# Patient Record
Sex: Female | Born: 1935 | Race: Black or African American | Hispanic: No | Marital: Married | Smoking: Former smoker
Health system: Southern US, Community
[De-identification: ages and names within clinical notes are randomized; demographics above are authoritative.]

## PROBLEM LIST (undated history)

## (undated) DIAGNOSIS — E039 Hypothyroidism, unspecified: Secondary | ICD-10-CM

## (undated) DIAGNOSIS — R32 Unspecified urinary incontinence: Secondary | ICD-10-CM

## (undated) DIAGNOSIS — F0390 Unspecified dementia without behavioral disturbance: Secondary | ICD-10-CM

## (undated) DIAGNOSIS — I8390 Asymptomatic varicose veins of unspecified lower extremity: Secondary | ICD-10-CM

## (undated) DIAGNOSIS — E559 Vitamin D deficiency, unspecified: Secondary | ICD-10-CM

## (undated) DIAGNOSIS — N289 Disorder of kidney and ureter, unspecified: Secondary | ICD-10-CM

## (undated) DIAGNOSIS — G8929 Other chronic pain: Secondary | ICD-10-CM

## (undated) DIAGNOSIS — I82409 Acute embolism and thrombosis of unspecified deep veins of unspecified lower extremity: Secondary | ICD-10-CM

## (undated) DIAGNOSIS — J45909 Unspecified asthma, uncomplicated: Secondary | ICD-10-CM

## (undated) DIAGNOSIS — L511 Stevens-Johnson syndrome: Secondary | ICD-10-CM

## (undated) DIAGNOSIS — S8010XA Contusion of unspecified lower leg, initial encounter: Secondary | ICD-10-CM

## (undated) DIAGNOSIS — M19049 Primary osteoarthritis, unspecified hand: Secondary | ICD-10-CM

## (undated) DIAGNOSIS — G40909 Epilepsy, unspecified, not intractable, without status epilepticus: Secondary | ICD-10-CM

## (undated) DIAGNOSIS — H409 Unspecified glaucoma: Secondary | ICD-10-CM

## (undated) DIAGNOSIS — M810 Age-related osteoporosis without current pathological fracture: Secondary | ICD-10-CM

## (undated) DIAGNOSIS — R569 Unspecified convulsions: Secondary | ICD-10-CM

## (undated) DIAGNOSIS — I10 Essential (primary) hypertension: Secondary | ICD-10-CM

## (undated) DIAGNOSIS — I679 Cerebrovascular disease, unspecified: Secondary | ICD-10-CM

## (undated) DIAGNOSIS — L97919 Non-pressure chronic ulcer of unspecified part of right lower leg with unspecified severity: Secondary | ICD-10-CM

## (undated) DIAGNOSIS — H543 Unqualified visual loss, both eyes: Secondary | ICD-10-CM

## (undated) DIAGNOSIS — N3281 Overactive bladder: Secondary | ICD-10-CM

## (undated) DIAGNOSIS — I83009 Varicose veins of unspecified lower extremity with ulcer of unspecified site: Secondary | ICD-10-CM

## (undated) DIAGNOSIS — L723 Sebaceous cyst: Secondary | ICD-10-CM

## (undated) DIAGNOSIS — F419 Anxiety disorder, unspecified: Secondary | ICD-10-CM

## (undated) DIAGNOSIS — G319 Degenerative disease of nervous system, unspecified: Secondary | ICD-10-CM

## (undated) DIAGNOSIS — M549 Dorsalgia, unspecified: Secondary | ICD-10-CM

## (undated) DIAGNOSIS — M1990 Unspecified osteoarthritis, unspecified site: Secondary | ICD-10-CM

## (undated) DIAGNOSIS — M5431 Sciatica, right side: Secondary | ICD-10-CM

## (undated) DIAGNOSIS — I83019 Varicose veins of right lower extremity with ulcer of unspecified site: Secondary | ICD-10-CM

## (undated) DIAGNOSIS — H3582 Retinal ischemia: Secondary | ICD-10-CM

## (undated) DIAGNOSIS — F32A Depression, unspecified: Secondary | ICD-10-CM

## (undated) DIAGNOSIS — F329 Major depressive disorder, single episode, unspecified: Secondary | ICD-10-CM

## (undated) DIAGNOSIS — I639 Cerebral infarction, unspecified: Secondary | ICD-10-CM

## (undated) DIAGNOSIS — L97909 Non-pressure chronic ulcer of unspecified part of unspecified lower leg with unspecified severity: Secondary | ICD-10-CM

## (undated) DIAGNOSIS — C50919 Malignant neoplasm of unspecified site of unspecified female breast: Secondary | ICD-10-CM

## (undated) DIAGNOSIS — M949 Disorder of cartilage, unspecified: Secondary | ICD-10-CM

## (undated) DIAGNOSIS — F418 Other specified anxiety disorders: Secondary | ICD-10-CM

## (undated) DIAGNOSIS — N764 Abscess of vulva: Secondary | ICD-10-CM

## (undated) DIAGNOSIS — M899 Disorder of bone, unspecified: Secondary | ICD-10-CM

## (undated) HISTORY — DX: Sebaceous cyst: L72.3

## (undated) HISTORY — DX: Retinal ischemia: H35.82

## (undated) HISTORY — DX: Non-pressure chronic ulcer of unspecified part of right lower leg with unspecified severity: L97.919

## (undated) HISTORY — DX: Age-related osteoporosis without current pathological fracture: M81.0

## (undated) HISTORY — DX: Disorder of bone, unspecified: M89.9

## (undated) HISTORY — DX: Disorder of kidney and ureter, unspecified: N28.9

## (undated) HISTORY — PX: TONSILLECTOMY: SUR1361

## (undated) HISTORY — DX: Epilepsy, unspecified, not intractable, without status epilepticus: G40.909

## (undated) HISTORY — DX: Disorder of cartilage, unspecified: M94.9

## (undated) HISTORY — DX: Major depressive disorder, single episode, unspecified: F32.9

## (undated) HISTORY — DX: Varicose veins of unspecified lower extremity with ulcer of unspecified site: I83.009

## (undated) HISTORY — DX: Cerebrovascular disease, unspecified: I67.9

## (undated) HISTORY — DX: Malignant neoplasm of unspecified site of unspecified female breast: C50.919

## (undated) HISTORY — DX: Primary osteoarthritis, unspecified hand: M19.049

## (undated) HISTORY — DX: Varicose veins of unspecified lower extremity with ulcer of unspecified site: L97.909

## (undated) HISTORY — DX: Unspecified glaucoma: H40.9

## (undated) HISTORY — PX: REFRACTIVE SURGERY: SHX103

## (undated) HISTORY — DX: Asymptomatic varicose veins of unspecified lower extremity: I83.90

## (undated) HISTORY — DX: Stevens-Johnson syndrome: L51.1

## (undated) HISTORY — DX: Hypothyroidism, unspecified: E03.9

## (undated) HISTORY — DX: Vitamin D deficiency, unspecified: E55.9

## (undated) HISTORY — DX: Depression, unspecified: F32.A

## (undated) HISTORY — DX: Degenerative disease of nervous system, unspecified: G31.9

## (undated) HISTORY — DX: Unspecified urinary incontinence: R32

## (undated) HISTORY — DX: Acute embolism and thrombosis of unspecified deep veins of unspecified lower extremity: I82.409

## (undated) HISTORY — DX: Contusion of unspecified lower leg, initial encounter: S80.10XA

## (undated) HISTORY — PX: TOTAL ABDOMINAL HYSTERECTOMY: SHX209

## (undated) HISTORY — DX: Other specified anxiety disorders: F41.8

## (undated) HISTORY — DX: Unspecified convulsions: R56.9

## (undated) HISTORY — DX: Sciatica, right side: M54.31

## (undated) HISTORY — DX: Unspecified osteoarthritis, unspecified site: M19.90

## (undated) HISTORY — DX: Overactive bladder: N32.81

## (undated) HISTORY — DX: Cerebral infarction, unspecified: I63.9

## (undated) HISTORY — DX: Essential (primary) hypertension: I10

## (undated) HISTORY — DX: Varicose veins of right lower extremity with ulcer of unspecified site: I83.019

---

## 1997-08-24 ENCOUNTER — Ambulatory Visit (HOSPITAL_COMMUNITY): Admission: RE | Admit: 1997-08-24 | Discharge: 1997-08-24 | Payer: Self-pay | Admitting: Internal Medicine

## 1997-10-27 ENCOUNTER — Ambulatory Visit (HOSPITAL_COMMUNITY): Admission: RE | Admit: 1997-10-27 | Discharge: 1997-10-27 | Payer: Self-pay | Admitting: Internal Medicine

## 1998-07-26 ENCOUNTER — Ambulatory Visit (HOSPITAL_COMMUNITY): Admission: RE | Admit: 1998-07-26 | Discharge: 1998-07-26 | Payer: Self-pay | Admitting: Internal Medicine

## 1998-07-26 ENCOUNTER — Encounter: Payer: Self-pay | Admitting: Internal Medicine

## 1998-12-04 ENCOUNTER — Encounter: Payer: Self-pay | Admitting: Internal Medicine

## 1998-12-04 ENCOUNTER — Ambulatory Visit (HOSPITAL_COMMUNITY): Admission: RE | Admit: 1998-12-04 | Discharge: 1998-12-04 | Payer: Self-pay | Admitting: Internal Medicine

## 1998-12-12 ENCOUNTER — Other Ambulatory Visit: Admission: RE | Admit: 1998-12-12 | Discharge: 1998-12-12 | Payer: Self-pay | Admitting: Obstetrics & Gynecology

## 1999-02-11 ENCOUNTER — Encounter: Payer: Self-pay | Admitting: Internal Medicine

## 1999-02-11 ENCOUNTER — Ambulatory Visit (HOSPITAL_COMMUNITY): Admission: RE | Admit: 1999-02-11 | Discharge: 1999-02-11 | Payer: Self-pay | Admitting: Internal Medicine

## 1999-04-12 ENCOUNTER — Encounter: Payer: Self-pay | Admitting: Internal Medicine

## 1999-04-12 ENCOUNTER — Inpatient Hospital Stay (HOSPITAL_COMMUNITY): Admission: AD | Admit: 1999-04-12 | Discharge: 1999-04-17 | Payer: Self-pay | Admitting: Internal Medicine

## 1999-04-15 ENCOUNTER — Encounter: Payer: Self-pay | Admitting: Internal Medicine

## 1999-06-09 ENCOUNTER — Encounter: Payer: Self-pay | Admitting: Emergency Medicine

## 1999-06-09 ENCOUNTER — Emergency Department (HOSPITAL_COMMUNITY): Admission: EM | Admit: 1999-06-09 | Discharge: 1999-06-09 | Payer: Self-pay | Admitting: Emergency Medicine

## 1999-06-25 ENCOUNTER — Encounter: Payer: Self-pay | Admitting: Internal Medicine

## 1999-06-25 ENCOUNTER — Inpatient Hospital Stay (HOSPITAL_COMMUNITY): Admission: AD | Admit: 1999-06-25 | Discharge: 1999-06-28 | Payer: Self-pay | Admitting: Internal Medicine

## 1999-09-02 ENCOUNTER — Emergency Department (HOSPITAL_COMMUNITY): Admission: EM | Admit: 1999-09-02 | Discharge: 1999-09-02 | Payer: Self-pay | Admitting: Emergency Medicine

## 1999-09-02 ENCOUNTER — Encounter: Payer: Self-pay | Admitting: Emergency Medicine

## 1999-09-03 ENCOUNTER — Ambulatory Visit (HOSPITAL_COMMUNITY): Admission: RE | Admit: 1999-09-03 | Discharge: 1999-09-03 | Payer: Self-pay | Admitting: Internal Medicine

## 2000-12-26 ENCOUNTER — Emergency Department (HOSPITAL_COMMUNITY): Admission: EM | Admit: 2000-12-26 | Discharge: 2000-12-27 | Payer: Self-pay | Admitting: Emergency Medicine

## 2000-12-27 ENCOUNTER — Inpatient Hospital Stay (HOSPITAL_COMMUNITY): Admission: EM | Admit: 2000-12-27 | Discharge: 2001-01-02 | Payer: Self-pay | Admitting: Emergency Medicine

## 2000-12-27 ENCOUNTER — Encounter: Payer: Self-pay | Admitting: Emergency Medicine

## 2000-12-30 ENCOUNTER — Encounter: Payer: Self-pay | Admitting: Internal Medicine

## 2001-01-28 ENCOUNTER — Ambulatory Visit (HOSPITAL_COMMUNITY): Admission: RE | Admit: 2001-01-28 | Discharge: 2001-01-28 | Payer: Self-pay | Admitting: Internal Medicine

## 2001-03-25 ENCOUNTER — Encounter: Payer: Self-pay | Admitting: Internal Medicine

## 2001-03-25 ENCOUNTER — Ambulatory Visit (HOSPITAL_COMMUNITY): Admission: RE | Admit: 2001-03-25 | Discharge: 2001-03-25 | Payer: Self-pay | Admitting: Internal Medicine

## 2001-11-25 ENCOUNTER — Emergency Department (HOSPITAL_COMMUNITY): Admission: EM | Admit: 2001-11-25 | Discharge: 2001-11-25 | Payer: Self-pay | Admitting: Emergency Medicine

## 2001-11-30 ENCOUNTER — Ambulatory Visit (HOSPITAL_COMMUNITY): Admission: RE | Admit: 2001-11-30 | Discharge: 2001-11-30 | Payer: Self-pay | Admitting: Internal Medicine

## 2001-11-30 ENCOUNTER — Encounter: Payer: Self-pay | Admitting: Internal Medicine

## 2002-02-15 ENCOUNTER — Encounter: Payer: Self-pay | Admitting: Allergy and Immunology

## 2002-02-15 ENCOUNTER — Encounter: Admission: RE | Admit: 2002-02-15 | Discharge: 2002-02-15 | Payer: Self-pay | Admitting: *Deleted

## 2002-03-22 ENCOUNTER — Other Ambulatory Visit: Admission: RE | Admit: 2002-03-22 | Discharge: 2002-03-22 | Payer: Self-pay | Admitting: *Deleted

## 2003-07-21 ENCOUNTER — Encounter: Admission: RE | Admit: 2003-07-21 | Discharge: 2003-07-21 | Payer: Self-pay | Admitting: Internal Medicine

## 2004-03-18 ENCOUNTER — Ambulatory Visit (HOSPITAL_COMMUNITY): Admission: RE | Admit: 2004-03-18 | Discharge: 2004-03-18 | Payer: Self-pay | Admitting: Internal Medicine

## 2005-01-07 ENCOUNTER — Ambulatory Visit: Payer: Self-pay | Admitting: Internal Medicine

## 2005-01-08 ENCOUNTER — Ambulatory Visit (HOSPITAL_COMMUNITY): Admission: RE | Admit: 2005-01-08 | Discharge: 2005-01-08 | Payer: Self-pay | Admitting: Internal Medicine

## 2005-02-11 ENCOUNTER — Ambulatory Visit: Payer: Self-pay | Admitting: Internal Medicine

## 2005-03-11 ENCOUNTER — Ambulatory Visit: Payer: Self-pay | Admitting: Internal Medicine

## 2005-03-14 ENCOUNTER — Ambulatory Visit: Payer: Self-pay | Admitting: Internal Medicine

## 2005-08-28 ENCOUNTER — Ambulatory Visit: Payer: Self-pay | Admitting: Internal Medicine

## 2005-09-03 ENCOUNTER — Ambulatory Visit: Payer: Self-pay | Admitting: Internal Medicine

## 2006-01-26 ENCOUNTER — Ambulatory Visit: Payer: Self-pay | Admitting: Internal Medicine

## 2006-01-27 ENCOUNTER — Ambulatory Visit: Payer: Self-pay | Admitting: Internal Medicine

## 2006-04-20 ENCOUNTER — Ambulatory Visit: Payer: Self-pay | Admitting: Internal Medicine

## 2006-05-04 ENCOUNTER — Encounter: Admission: RE | Admit: 2006-05-04 | Discharge: 2006-05-04 | Payer: Self-pay | Admitting: Orthopaedic Surgery

## 2006-05-25 ENCOUNTER — Ambulatory Visit: Payer: Self-pay | Admitting: Internal Medicine

## 2006-06-01 ENCOUNTER — Encounter: Admission: RE | Admit: 2006-06-01 | Discharge: 2006-06-01 | Payer: Self-pay | Admitting: Orthopaedic Surgery

## 2006-06-19 ENCOUNTER — Encounter: Admission: RE | Admit: 2006-06-19 | Discharge: 2006-06-19 | Payer: Self-pay | Admitting: Orthopaedic Surgery

## 2006-07-10 ENCOUNTER — Ambulatory Visit: Payer: Self-pay | Admitting: Internal Medicine

## 2006-07-24 ENCOUNTER — Ambulatory Visit: Payer: Self-pay | Admitting: Internal Medicine

## 2006-07-24 LAB — CONVERTED CEMR LAB
Anti Nuclear Antibody(ANA): NEGATIVE
BUN: 9 mg/dL (ref 6–23)
CO2: 28 meq/L (ref 19–32)
Chloride: 112 meq/L (ref 96–112)
Creatinine, Ser: 1 mg/dL (ref 0.4–1.2)
Potassium: 4.4 meq/L (ref 3.5–5.1)
Rhuematoid fact SerPl-aCnc: <20 intl units/mL — ABNORMAL LOW (ref 0.0–20.0)

## 2006-09-14 ENCOUNTER — Ambulatory Visit: Payer: Self-pay | Admitting: Internal Medicine

## 2006-09-24 ENCOUNTER — Ambulatory Visit: Payer: Self-pay

## 2006-10-14 ENCOUNTER — Ambulatory Visit: Payer: Self-pay | Admitting: Internal Medicine

## 2006-10-14 ENCOUNTER — Ambulatory Visit: Payer: Self-pay

## 2006-10-22 HISTORY — PX: TOTAL HIP ARTHROPLASTY: SHX124

## 2006-11-17 ENCOUNTER — Inpatient Hospital Stay (HOSPITAL_COMMUNITY): Admission: RE | Admit: 2006-11-17 | Discharge: 2006-11-24 | Payer: Self-pay | Admitting: Orthopaedic Surgery

## 2006-11-18 ENCOUNTER — Ambulatory Visit: Payer: Self-pay | Admitting: Physical Medicine & Rehabilitation

## 2006-11-23 ENCOUNTER — Ambulatory Visit: Payer: Self-pay | Admitting: Vascular Surgery

## 2006-12-15 ENCOUNTER — Ambulatory Visit: Payer: Self-pay | Admitting: Internal Medicine

## 2006-12-15 LAB — CONVERTED CEMR LAB: TSH: 1.41 microintl units/mL (ref 0.35–5.50)

## 2006-12-18 ENCOUNTER — Emergency Department (HOSPITAL_COMMUNITY): Admission: EM | Admit: 2006-12-18 | Discharge: 2006-12-18 | Payer: Self-pay | Admitting: Emergency Medicine

## 2007-01-15 DIAGNOSIS — Z8679 Personal history of other diseases of the circulatory system: Secondary | ICD-10-CM | POA: Insufficient documentation

## 2007-01-15 DIAGNOSIS — I10 Essential (primary) hypertension: Secondary | ICD-10-CM

## 2007-01-15 DIAGNOSIS — E039 Hypothyroidism, unspecified: Secondary | ICD-10-CM | POA: Insufficient documentation

## 2007-01-15 DIAGNOSIS — R569 Unspecified convulsions: Secondary | ICD-10-CM

## 2007-03-30 ENCOUNTER — Ambulatory Visit: Payer: Self-pay | Admitting: Internal Medicine

## 2007-03-30 LAB — CONVERTED CEMR LAB
ALT: 29 units/L (ref 0–35)
AST: 25 units/L (ref 0–37)
Alkaline Phosphatase: 67 units/L (ref 39–117)
BUN: 15 mg/dL (ref 6–23)
Bacteria, UA: NEGATIVE
Basophils Relative: 0.2 % (ref 0.0–1.0)
Bilirubin, Direct: 0.1 mg/dL (ref 0.0–0.3)
CO2: 32 meq/L (ref 19–32)
Calcium: 9.4 mg/dL (ref 8.4–10.5)
Chloride: 107 meq/L (ref 96–112)
Crystals: NEGATIVE
Eosinophils Relative: 2.9 % (ref 0.0–5.0)
GFR calc Af Amer: 68 mL/min
GFR calc non Af Amer: 57 mL/min
Glucose, Bld: 94 mg/dL (ref 70–99)
HCT: 40.7 % (ref 36.0–46.0)
Hemoglobin, Urine: NEGATIVE
Ketones, ur: NEGATIVE mg/dL
MCV: 81.5 fL (ref 78.0–100.0)
Mucus, UA: NEGATIVE
Neutrophils Relative %: 53 % (ref 43.0–77.0)
Platelets: 217 10*3/uL (ref 150–400)
RBC / HPF: NONE SEEN
RBC: 4.99 M/uL (ref 3.87–5.11)
RDW: 13.4 % (ref 11.5–14.6)
TSH: 1.47 microintl units/mL (ref 0.35–5.50)
Total Protein, Urine: NEGATIVE mg/dL
Total Protein: 7.2 g/dL (ref 6.0–8.3)
WBC: 7.2 10*3/uL (ref 4.5–10.5)

## 2007-05-12 ENCOUNTER — Ambulatory Visit: Payer: Self-pay | Admitting: Internal Medicine

## 2007-05-12 ENCOUNTER — Encounter (INDEPENDENT_AMBULATORY_CARE_PROVIDER_SITE_OTHER): Payer: Self-pay | Admitting: *Deleted

## 2007-05-12 DIAGNOSIS — IMO0001 Reserved for inherently not codable concepts without codable children: Secondary | ICD-10-CM

## 2007-05-13 ENCOUNTER — Encounter: Payer: Self-pay | Admitting: Internal Medicine

## 2007-05-24 DIAGNOSIS — I82409 Acute embolism and thrombosis of unspecified deep veins of unspecified lower extremity: Secondary | ICD-10-CM

## 2007-05-24 HISTORY — DX: Acute embolism and thrombosis of unspecified deep veins of unspecified lower extremity: I82.409

## 2007-05-26 ENCOUNTER — Ambulatory Visit (HOSPITAL_COMMUNITY): Admission: RE | Admit: 2007-05-26 | Discharge: 2007-05-26 | Payer: Self-pay | Admitting: Rheumatology

## 2007-05-26 ENCOUNTER — Encounter (INDEPENDENT_AMBULATORY_CARE_PROVIDER_SITE_OTHER): Payer: Self-pay | Admitting: Rheumatology

## 2007-05-26 ENCOUNTER — Inpatient Hospital Stay (HOSPITAL_COMMUNITY): Admission: EM | Admit: 2007-05-26 | Discharge: 2007-05-28 | Payer: Self-pay | Admitting: Emergency Medicine

## 2007-05-26 ENCOUNTER — Ambulatory Visit: Payer: Self-pay | Admitting: Surgery

## 2007-05-26 ENCOUNTER — Ambulatory Visit: Payer: Self-pay | Admitting: Internal Medicine

## 2007-05-26 ENCOUNTER — Encounter: Payer: Self-pay | Admitting: Internal Medicine

## 2007-05-28 ENCOUNTER — Telehealth: Payer: Self-pay | Admitting: Internal Medicine

## 2007-05-28 ENCOUNTER — Encounter: Payer: Self-pay | Admitting: Internal Medicine

## 2007-05-28 DIAGNOSIS — I82409 Acute embolism and thrombosis of unspecified deep veins of unspecified lower extremity: Secondary | ICD-10-CM | POA: Insufficient documentation

## 2007-05-31 ENCOUNTER — Ambulatory Visit: Payer: Self-pay | Admitting: Internal Medicine

## 2007-05-31 DIAGNOSIS — R5381 Other malaise: Secondary | ICD-10-CM | POA: Insufficient documentation

## 2007-06-02 ENCOUNTER — Telehealth (INDEPENDENT_AMBULATORY_CARE_PROVIDER_SITE_OTHER): Payer: Self-pay | Admitting: *Deleted

## 2007-06-03 ENCOUNTER — Ambulatory Visit: Payer: Self-pay | Admitting: Cardiology

## 2007-06-03 ENCOUNTER — Encounter: Payer: Self-pay | Admitting: Internal Medicine

## 2007-06-10 ENCOUNTER — Ambulatory Visit: Payer: Self-pay | Admitting: Cardiology

## 2007-06-15 ENCOUNTER — Ambulatory Visit: Payer: Self-pay | Admitting: Internal Medicine

## 2007-06-15 DIAGNOSIS — I87029 Postthrombotic syndrome with inflammation of unspecified lower extremity: Secondary | ICD-10-CM | POA: Insufficient documentation

## 2007-06-16 ENCOUNTER — Ambulatory Visit (HOSPITAL_COMMUNITY): Admission: RE | Admit: 2007-06-16 | Discharge: 2007-06-16 | Payer: Self-pay | Admitting: Internal Medicine

## 2007-06-16 ENCOUNTER — Ambulatory Visit: Payer: Self-pay | Admitting: Oncology

## 2007-06-16 ENCOUNTER — Encounter: Payer: Self-pay | Admitting: Internal Medicine

## 2007-06-16 ENCOUNTER — Ambulatory Visit: Payer: Self-pay | Admitting: *Deleted

## 2007-06-21 ENCOUNTER — Ambulatory Visit: Payer: Self-pay | Admitting: Cardiology

## 2007-06-29 ENCOUNTER — Telehealth: Payer: Self-pay | Admitting: Internal Medicine

## 2007-07-05 ENCOUNTER — Ambulatory Visit: Payer: Self-pay | Admitting: Cardiology

## 2007-07-06 ENCOUNTER — Encounter: Payer: Self-pay | Admitting: Internal Medicine

## 2007-07-06 ENCOUNTER — Ambulatory Visit: Payer: Self-pay | Admitting: Vascular Surgery

## 2007-07-22 ENCOUNTER — Encounter: Payer: Self-pay | Admitting: Internal Medicine

## 2007-07-26 ENCOUNTER — Ambulatory Visit: Payer: Self-pay | Admitting: Internal Medicine

## 2007-08-06 ENCOUNTER — Ambulatory Visit: Payer: Self-pay | Admitting: Internal Medicine

## 2007-08-06 DIAGNOSIS — I749 Embolism and thrombosis of unspecified artery: Secondary | ICD-10-CM | POA: Insufficient documentation

## 2007-08-06 DIAGNOSIS — R7309 Other abnormal glucose: Secondary | ICD-10-CM

## 2007-08-06 LAB — CONVERTED CEMR LAB
Basophils Relative: 0.1 % (ref 0.0–1.0)
CO2: 31 meq/L (ref 19–32)
Eosinophils Absolute: 0.2 10*3/uL (ref 0.0–0.6)
Eosinophils Relative: 3.6 % (ref 0.0–5.0)
GFR calc Af Amer: 77 mL/min
GFR calc non Af Amer: 64 mL/min
Glucose, Bld: 97 mg/dL (ref 70–99)
Hemoglobin: 12.8 g/dL (ref 12.0–15.0)
Lymphocytes Relative: 46 % (ref 12.0–46.0)
MCV: 83 fL (ref 78.0–100.0)
Monocytes Absolute: 0.6 10*3/uL (ref 0.2–0.7)
Neutro Abs: 2.5 10*3/uL (ref 1.4–7.7)
Potassium: 4.4 meq/L (ref 3.5–5.1)
Sodium: 144 meq/L (ref 135–145)
WBC: 6.2 10*3/uL (ref 4.5–10.5)

## 2007-08-10 ENCOUNTER — Ambulatory Visit: Payer: Self-pay | Admitting: Internal Medicine

## 2007-08-19 ENCOUNTER — Ambulatory Visit: Payer: Self-pay | Admitting: Oncology

## 2007-08-26 ENCOUNTER — Ambulatory Visit: Payer: Self-pay | Admitting: Cardiovascular Disease

## 2007-09-21 ENCOUNTER — Ambulatory Visit: Payer: Self-pay | Admitting: Internal Medicine

## 2007-09-23 ENCOUNTER — Ambulatory Visit: Payer: Self-pay | Admitting: Internal Medicine

## 2007-10-05 ENCOUNTER — Ambulatory Visit: Payer: Self-pay | Admitting: Family Medicine

## 2007-10-05 ENCOUNTER — Encounter: Payer: Self-pay | Admitting: Internal Medicine

## 2007-10-19 ENCOUNTER — Telehealth (INDEPENDENT_AMBULATORY_CARE_PROVIDER_SITE_OTHER): Payer: Self-pay | Admitting: *Deleted

## 2007-10-21 ENCOUNTER — Ambulatory Visit: Payer: Self-pay | Admitting: Cardiology

## 2007-10-22 ENCOUNTER — Ambulatory Visit: Payer: Self-pay | Admitting: Internal Medicine

## 2007-10-22 DIAGNOSIS — M899 Disorder of bone, unspecified: Secondary | ICD-10-CM | POA: Insufficient documentation

## 2007-10-22 DIAGNOSIS — M949 Disorder of cartilage, unspecified: Secondary | ICD-10-CM

## 2007-11-08 ENCOUNTER — Ambulatory Visit: Payer: Self-pay | Admitting: Internal Medicine

## 2007-11-08 DIAGNOSIS — M542 Cervicalgia: Secondary | ICD-10-CM

## 2007-11-08 DIAGNOSIS — M19049 Primary osteoarthritis, unspecified hand: Secondary | ICD-10-CM | POA: Insufficient documentation

## 2007-11-18 ENCOUNTER — Encounter: Payer: Self-pay | Admitting: Internal Medicine

## 2007-11-18 ENCOUNTER — Ambulatory Visit: Payer: Self-pay | Admitting: Cardiology

## 2007-12-16 ENCOUNTER — Ambulatory Visit: Payer: Self-pay | Admitting: Cardiology

## 2007-12-27 ENCOUNTER — Ambulatory Visit: Payer: Self-pay | Admitting: Internal Medicine

## 2008-01-03 ENCOUNTER — Telehealth: Payer: Self-pay | Admitting: Internal Medicine

## 2008-01-04 ENCOUNTER — Encounter: Payer: Self-pay | Admitting: Internal Medicine

## 2008-01-04 ENCOUNTER — Telehealth: Payer: Self-pay | Admitting: Internal Medicine

## 2008-01-04 ENCOUNTER — Ambulatory Visit (HOSPITAL_COMMUNITY): Admission: RE | Admit: 2008-01-04 | Discharge: 2008-01-04 | Payer: Self-pay | Admitting: Internal Medicine

## 2008-01-13 ENCOUNTER — Ambulatory Visit: Payer: Self-pay | Admitting: Internal Medicine

## 2008-01-29 ENCOUNTER — Telehealth: Payer: Self-pay | Admitting: Internal Medicine

## 2008-02-01 ENCOUNTER — Ambulatory Visit: Payer: Self-pay | Admitting: Cardiovascular Disease

## 2008-02-16 ENCOUNTER — Encounter: Payer: Self-pay | Admitting: Internal Medicine

## 2008-02-17 ENCOUNTER — Encounter: Payer: Self-pay | Admitting: Internal Medicine

## 2008-02-18 ENCOUNTER — Ambulatory Visit: Payer: Self-pay | Admitting: Cardiology

## 2008-03-13 ENCOUNTER — Ambulatory Visit: Payer: Self-pay | Admitting: Cardiovascular Disease

## 2008-04-04 ENCOUNTER — Encounter (HOSPITAL_COMMUNITY): Admission: RE | Admit: 2008-04-04 | Discharge: 2008-06-22 | Payer: Self-pay | Admitting: Internal Medicine

## 2008-04-07 ENCOUNTER — Ambulatory Visit: Payer: Self-pay | Admitting: Cardiology

## 2008-04-28 ENCOUNTER — Telehealth: Payer: Self-pay | Admitting: Internal Medicine

## 2008-04-28 ENCOUNTER — Ambulatory Visit: Payer: Self-pay | Admitting: Internal Medicine

## 2008-04-28 LAB — CONVERTED CEMR LAB
BUN: 20 mg/dL (ref 6–23)
CO2: 30 meq/L (ref 19–32)
Calcium: 9 mg/dL (ref 8.4–10.5)
Creatinine, Ser: 1.2 mg/dL (ref 0.4–1.2)
Glucose, Bld: 95 mg/dL (ref 70–99)
Sodium: 139 meq/L (ref 135–145)

## 2008-05-05 ENCOUNTER — Ambulatory Visit: Payer: Self-pay | Admitting: Internal Medicine

## 2008-05-26 ENCOUNTER — Encounter: Payer: Self-pay | Admitting: Internal Medicine

## 2008-06-28 ENCOUNTER — Ambulatory Visit: Payer: Self-pay | Admitting: Cardiology

## 2008-06-28 ENCOUNTER — Encounter: Payer: Self-pay | Admitting: Internal Medicine

## 2008-07-03 ENCOUNTER — Encounter (HOSPITAL_COMMUNITY): Admission: RE | Admit: 2008-07-03 | Discharge: 2008-10-01 | Payer: Self-pay | Admitting: Internal Medicine

## 2008-07-26 ENCOUNTER — Ambulatory Visit: Payer: Self-pay | Admitting: Cardiology

## 2008-08-25 ENCOUNTER — Ambulatory Visit: Payer: Self-pay | Admitting: Internal Medicine

## 2008-08-25 LAB — CONVERTED CEMR LAB
Basophils Absolute: 0 10*3/uL (ref 0.0–0.1)
Eosinophils Absolute: 0.3 10*3/uL (ref 0.0–0.7)
Eosinophils Relative: 4.4 % (ref 0.0–5.0)
HCT: 42.6 % (ref 36.0–46.0)
MCHC: 33.4 g/dL (ref 30.0–36.0)
MCV: 82.7 fL (ref 78.0–100.0)
Monocytes Absolute: 0.4 10*3/uL (ref 0.1–1.0)
Platelets: 222 10*3/uL (ref 150–400)
WBC: 6.3 10*3/uL (ref 4.5–10.5)

## 2008-08-26 ENCOUNTER — Encounter: Payer: Self-pay | Admitting: Internal Medicine

## 2008-08-29 ENCOUNTER — Telehealth: Payer: Self-pay | Admitting: Internal Medicine

## 2008-08-29 DIAGNOSIS — E559 Vitamin D deficiency, unspecified: Secondary | ICD-10-CM | POA: Insufficient documentation

## 2008-09-18 ENCOUNTER — Ambulatory Visit: Payer: Self-pay | Admitting: Cardiology

## 2008-10-02 ENCOUNTER — Ambulatory Visit: Payer: Self-pay | Admitting: Internal Medicine

## 2008-10-03 ENCOUNTER — Encounter: Payer: Self-pay | Admitting: Internal Medicine

## 2008-10-03 ENCOUNTER — Telehealth: Payer: Self-pay | Admitting: Internal Medicine

## 2008-10-09 ENCOUNTER — Ambulatory Visit: Payer: Self-pay | Admitting: Internal Medicine

## 2008-10-09 DIAGNOSIS — M545 Low back pain, unspecified: Secondary | ICD-10-CM | POA: Insufficient documentation

## 2008-10-09 LAB — CONVERTED CEMR LAB

## 2008-10-23 ENCOUNTER — Telehealth: Payer: Self-pay | Admitting: Internal Medicine

## 2008-10-23 ENCOUNTER — Ambulatory Visit: Payer: Self-pay | Admitting: Internal Medicine

## 2008-10-30 ENCOUNTER — Ambulatory Visit: Payer: Self-pay | Admitting: Cardiovascular Disease

## 2008-10-31 ENCOUNTER — Ambulatory Visit: Payer: Self-pay | Admitting: Internal Medicine

## 2008-10-31 DIAGNOSIS — F341 Dysthymic disorder: Secondary | ICD-10-CM

## 2008-10-31 DIAGNOSIS — H409 Unspecified glaucoma: Secondary | ICD-10-CM | POA: Insufficient documentation

## 2008-11-17 ENCOUNTER — Ambulatory Visit: Payer: Self-pay | Admitting: Internal Medicine

## 2008-11-17 LAB — CONVERTED CEMR LAB: POC INR: 1.7

## 2008-11-22 ENCOUNTER — Encounter: Payer: Self-pay | Admitting: *Deleted

## 2008-12-05 ENCOUNTER — Ambulatory Visit: Payer: Self-pay | Admitting: Internal Medicine

## 2008-12-05 DIAGNOSIS — M79609 Pain in unspecified limb: Secondary | ICD-10-CM | POA: Insufficient documentation

## 2008-12-05 LAB — CONVERTED CEMR LAB
GFR calc non Af Amer: 60.97 mL/min (ref >60)
Glucose, Bld: 89 mg/dL (ref 70–99)
Potassium: 5.1 meq/L (ref 3.5–5.1)
Sodium: 143 meq/L (ref 135–145)
Vit D, 1,25-Dihydroxy: 33

## 2008-12-07 ENCOUNTER — Encounter: Payer: Self-pay | Admitting: Internal Medicine

## 2008-12-08 ENCOUNTER — Ambulatory Visit: Payer: Self-pay | Admitting: Internal Medicine

## 2008-12-08 LAB — CONVERTED CEMR LAB: POC INR: 1.6

## 2008-12-19 ENCOUNTER — Emergency Department (HOSPITAL_COMMUNITY): Admission: EM | Admit: 2008-12-19 | Discharge: 2008-12-19 | Payer: Self-pay | Admitting: Emergency Medicine

## 2008-12-19 ENCOUNTER — Telehealth: Payer: Self-pay | Admitting: Family Medicine

## 2008-12-20 ENCOUNTER — Telehealth: Payer: Self-pay | Admitting: Internal Medicine

## 2008-12-21 ENCOUNTER — Ambulatory Visit: Payer: Self-pay | Admitting: Internal Medicine

## 2008-12-22 ENCOUNTER — Telehealth: Payer: Self-pay | Admitting: Internal Medicine

## 2008-12-24 ENCOUNTER — Encounter: Payer: Self-pay | Admitting: Internal Medicine

## 2008-12-26 ENCOUNTER — Ambulatory Visit: Payer: Self-pay | Admitting: Internal Medicine

## 2008-12-26 DIAGNOSIS — R42 Dizziness and giddiness: Secondary | ICD-10-CM

## 2008-12-27 ENCOUNTER — Ambulatory Visit (HOSPITAL_BASED_OUTPATIENT_CLINIC_OR_DEPARTMENT_OTHER): Admission: RE | Admit: 2008-12-27 | Discharge: 2008-12-27 | Payer: Self-pay | Admitting: Internal Medicine

## 2008-12-27 ENCOUNTER — Encounter: Payer: Self-pay | Admitting: *Deleted

## 2008-12-27 ENCOUNTER — Ambulatory Visit: Payer: Self-pay | Admitting: Diagnostic Radiology

## 2008-12-28 ENCOUNTER — Telehealth: Payer: Self-pay | Admitting: Internal Medicine

## 2009-01-08 ENCOUNTER — Ambulatory Visit: Payer: Self-pay | Admitting: Cardiology

## 2009-01-08 LAB — CONVERTED CEMR LAB
POC INR: 3.8
Prothrombin Time: 23.6 s

## 2009-01-15 ENCOUNTER — Telehealth: Payer: Self-pay | Admitting: Internal Medicine

## 2009-01-17 ENCOUNTER — Telehealth: Payer: Self-pay | Admitting: Internal Medicine

## 2009-01-22 ENCOUNTER — Ambulatory Visit: Payer: Self-pay | Admitting: Internal Medicine

## 2009-01-22 ENCOUNTER — Encounter (INDEPENDENT_AMBULATORY_CARE_PROVIDER_SITE_OTHER): Payer: Self-pay | Admitting: Cardiology

## 2009-01-30 ENCOUNTER — Encounter: Payer: Self-pay | Admitting: Internal Medicine

## 2009-02-09 ENCOUNTER — Encounter: Payer: Self-pay | Admitting: Internal Medicine

## 2009-02-12 ENCOUNTER — Encounter (INDEPENDENT_AMBULATORY_CARE_PROVIDER_SITE_OTHER): Payer: Self-pay | Admitting: Cardiology

## 2009-02-12 ENCOUNTER — Ambulatory Visit: Payer: Self-pay | Admitting: Cardiovascular Disease

## 2009-02-12 LAB — CONVERTED CEMR LAB
Basophils Absolute: 0 10*3/uL (ref 0.0–0.1)
Basophils Relative: 0 % (ref 0–1)
Eosinophils Absolute: 0.2 10*3/uL (ref 0.0–0.7)
Eosinophils Relative: 3 % (ref 0–5)
HCT: 41.1 % (ref 36.0–46.0)
MCV: 80.6 fL (ref 78.0–100.0)
Platelets: 240 10*3/uL (ref 150–400)
RDW: 15.1 % (ref 11.5–15.5)
aPTT: 44 s — ABNORMAL HIGH (ref 24–37)

## 2009-02-13 ENCOUNTER — Ambulatory Visit (HOSPITAL_BASED_OUTPATIENT_CLINIC_OR_DEPARTMENT_OTHER): Admission: RE | Admit: 2009-02-13 | Discharge: 2009-02-13 | Payer: Self-pay | Admitting: Internal Medicine

## 2009-02-13 ENCOUNTER — Ambulatory Visit: Payer: Self-pay | Admitting: Diagnostic Radiology

## 2009-02-13 ENCOUNTER — Ambulatory Visit: Payer: Self-pay | Admitting: Internal Medicine

## 2009-02-13 DIAGNOSIS — R05 Cough: Secondary | ICD-10-CM

## 2009-02-13 DIAGNOSIS — R059 Cough, unspecified: Secondary | ICD-10-CM | POA: Insufficient documentation

## 2009-03-05 ENCOUNTER — Ambulatory Visit: Payer: Self-pay | Admitting: Cardiovascular Disease

## 2009-03-19 ENCOUNTER — Telehealth: Payer: Self-pay | Admitting: Internal Medicine

## 2009-03-22 ENCOUNTER — Ambulatory Visit: Payer: Self-pay | Admitting: Internal Medicine

## 2009-03-22 DIAGNOSIS — L97909 Non-pressure chronic ulcer of unspecified part of unspecified lower leg with unspecified severity: Secondary | ICD-10-CM

## 2009-03-22 DIAGNOSIS — I83009 Varicose veins of unspecified lower extremity with ulcer of unspecified site: Secondary | ICD-10-CM

## 2009-03-27 ENCOUNTER — Encounter: Payer: Self-pay | Admitting: Internal Medicine

## 2009-03-29 ENCOUNTER — Telehealth: Payer: Self-pay | Admitting: Internal Medicine

## 2009-03-30 ENCOUNTER — Ambulatory Visit: Payer: Self-pay | Admitting: Internal Medicine

## 2009-03-30 LAB — CONVERTED CEMR LAB: POC INR: 3

## 2009-04-02 ENCOUNTER — Telehealth: Payer: Self-pay | Admitting: Internal Medicine

## 2009-04-04 ENCOUNTER — Ambulatory Visit: Payer: Self-pay | Admitting: Internal Medicine

## 2009-04-04 ENCOUNTER — Encounter: Admission: RE | Admit: 2009-04-04 | Discharge: 2009-04-04 | Payer: Self-pay | Admitting: Orthopaedic Surgery

## 2009-04-04 LAB — CONVERTED CEMR LAB: POC INR: 1.2

## 2009-04-09 ENCOUNTER — Ambulatory Visit: Payer: Self-pay | Admitting: Internal Medicine

## 2009-04-12 ENCOUNTER — Telehealth: Payer: Self-pay | Admitting: Internal Medicine

## 2009-04-13 ENCOUNTER — Ambulatory Visit: Payer: Self-pay | Admitting: Cardiology

## 2009-04-20 ENCOUNTER — Encounter: Payer: Self-pay | Admitting: Internal Medicine

## 2009-04-23 ENCOUNTER — Ambulatory Visit: Payer: Self-pay

## 2009-04-23 ENCOUNTER — Encounter: Payer: Self-pay | Admitting: Internal Medicine

## 2009-04-26 ENCOUNTER — Ambulatory Visit: Payer: Self-pay | Admitting: Internal Medicine

## 2009-04-26 DIAGNOSIS — R51 Headache: Secondary | ICD-10-CM

## 2009-04-26 DIAGNOSIS — R519 Headache, unspecified: Secondary | ICD-10-CM | POA: Insufficient documentation

## 2009-04-27 ENCOUNTER — Ambulatory Visit: Payer: Self-pay | Admitting: Cardiovascular Disease

## 2009-04-27 LAB — CONVERTED CEMR LAB: POC INR: 2.9

## 2009-04-30 ENCOUNTER — Telehealth: Payer: Self-pay | Admitting: Internal Medicine

## 2009-04-30 ENCOUNTER — Encounter: Admission: RE | Admit: 2009-04-30 | Discharge: 2009-04-30 | Payer: Self-pay | Admitting: Internal Medicine

## 2009-05-11 ENCOUNTER — Ambulatory Visit: Payer: Self-pay | Admitting: Cardiology

## 2009-05-11 LAB — CONVERTED CEMR LAB: POC INR: 1.8

## 2009-05-24 ENCOUNTER — Ambulatory Visit: Payer: Self-pay | Admitting: Cardiovascular Disease

## 2009-05-24 LAB — CONVERTED CEMR LAB: POC INR: 2.7

## 2009-06-07 ENCOUNTER — Encounter: Payer: Self-pay | Admitting: Internal Medicine

## 2009-06-14 ENCOUNTER — Ambulatory Visit: Payer: Self-pay | Admitting: Cardiology

## 2009-06-23 HISTORY — PX: MASTECTOMY: SHX3

## 2009-07-02 ENCOUNTER — Ambulatory Visit: Payer: Self-pay | Admitting: Internal Medicine

## 2009-07-02 LAB — CONVERTED CEMR LAB
CO2: 29 meq/L (ref 19–32)
Calcium: 8.7 mg/dL (ref 8.4–10.5)
Glucose, Bld: 89 mg/dL (ref 70–99)
Sodium: 143 meq/L (ref 135–145)

## 2009-07-06 ENCOUNTER — Ambulatory Visit: Payer: Self-pay | Admitting: Internal Medicine

## 2009-07-06 DIAGNOSIS — H3582 Retinal ischemia: Secondary | ICD-10-CM

## 2009-07-13 ENCOUNTER — Telehealth: Payer: Self-pay | Admitting: Internal Medicine

## 2009-07-17 ENCOUNTER — Telehealth: Payer: Self-pay | Admitting: Internal Medicine

## 2009-07-20 ENCOUNTER — Ambulatory Visit: Payer: Self-pay | Admitting: Internal Medicine

## 2009-07-25 ENCOUNTER — Encounter: Payer: Self-pay | Admitting: Internal Medicine

## 2009-07-25 ENCOUNTER — Ambulatory Visit: Payer: Self-pay | Admitting: Internal Medicine

## 2009-07-25 LAB — CONVERTED CEMR LAB: POC INR: 4.3

## 2009-07-26 ENCOUNTER — Telehealth: Payer: Self-pay | Admitting: Internal Medicine

## 2009-07-26 ENCOUNTER — Ambulatory Visit: Payer: Self-pay

## 2009-07-26 ENCOUNTER — Encounter: Payer: Self-pay | Admitting: Internal Medicine

## 2009-07-31 ENCOUNTER — Telehealth: Payer: Self-pay | Admitting: Internal Medicine

## 2009-08-06 ENCOUNTER — Telehealth: Payer: Self-pay | Admitting: Internal Medicine

## 2009-08-09 ENCOUNTER — Ambulatory Visit: Payer: Self-pay | Admitting: Cardiology

## 2009-08-21 ENCOUNTER — Telehealth: Payer: Self-pay | Admitting: Internal Medicine

## 2009-08-21 ENCOUNTER — Ambulatory Visit: Payer: Self-pay | Admitting: Family

## 2009-08-21 ENCOUNTER — Ambulatory Visit: Payer: Self-pay | Admitting: Radiology

## 2009-08-21 ENCOUNTER — Ambulatory Visit (HOSPITAL_BASED_OUTPATIENT_CLINIC_OR_DEPARTMENT_OTHER): Admission: RE | Admit: 2009-08-21 | Discharge: 2009-08-21 | Payer: Self-pay | Admitting: Internal Medicine

## 2009-08-21 DIAGNOSIS — L259 Unspecified contact dermatitis, unspecified cause: Secondary | ICD-10-CM

## 2009-08-21 DIAGNOSIS — J40 Bronchitis, not specified as acute or chronic: Secondary | ICD-10-CM

## 2009-08-23 ENCOUNTER — Ambulatory Visit: Payer: Self-pay | Admitting: Cardiology

## 2009-08-23 ENCOUNTER — Encounter (INDEPENDENT_AMBULATORY_CARE_PROVIDER_SITE_OTHER): Payer: Self-pay | Admitting: Cardiology

## 2009-08-24 ENCOUNTER — Ambulatory Visit: Payer: Self-pay | Admitting: Internal Medicine

## 2009-09-05 ENCOUNTER — Encounter (INDEPENDENT_AMBULATORY_CARE_PROVIDER_SITE_OTHER): Payer: Self-pay | Admitting: *Deleted

## 2009-09-14 ENCOUNTER — Ambulatory Visit: Payer: Self-pay | Admitting: Internal Medicine

## 2009-09-14 LAB — CONVERTED CEMR LAB: POC INR: 3.3

## 2009-09-17 ENCOUNTER — Telehealth: Payer: Self-pay | Admitting: Internal Medicine

## 2009-09-17 ENCOUNTER — Ambulatory Visit: Payer: Self-pay | Admitting: Internal Medicine

## 2009-09-17 DIAGNOSIS — N318 Other neuromuscular dysfunction of bladder: Secondary | ICD-10-CM

## 2009-09-18 ENCOUNTER — Telehealth: Payer: Self-pay | Admitting: Internal Medicine

## 2009-10-04 ENCOUNTER — Encounter: Payer: Self-pay | Admitting: Internal Medicine

## 2009-10-05 ENCOUNTER — Ambulatory Visit: Payer: Self-pay | Admitting: Internal Medicine

## 2009-10-05 LAB — CONVERTED CEMR LAB: POC INR: 1.7

## 2009-10-23 ENCOUNTER — Ambulatory Visit: Payer: Self-pay | Admitting: Cardiology

## 2009-10-30 ENCOUNTER — Ambulatory Visit: Payer: Self-pay | Admitting: Cardiovascular Disease

## 2009-11-13 ENCOUNTER — Ambulatory Visit: Payer: Self-pay | Admitting: Internal Medicine

## 2009-11-13 LAB — CONVERTED CEMR LAB: POC INR: 2.5

## 2009-12-03 ENCOUNTER — Ambulatory Visit: Payer: Self-pay | Admitting: Internal Medicine

## 2009-12-31 ENCOUNTER — Ambulatory Visit: Payer: Self-pay | Admitting: Internal Medicine

## 2010-01-21 ENCOUNTER — Ambulatory Visit: Payer: Self-pay | Admitting: Cardiovascular Disease

## 2010-02-11 ENCOUNTER — Telehealth: Payer: Self-pay | Admitting: Internal Medicine

## 2010-02-12 ENCOUNTER — Ambulatory Visit: Payer: Self-pay | Admitting: Internal Medicine

## 2010-02-12 LAB — CONVERTED CEMR LAB
BUN: 15 mg/dL (ref 6–23)
CO2: 27 meq/L (ref 19–32)
Calcium: 9.4 mg/dL (ref 8.4–10.5)
Creatinine, Ser: 1.14 mg/dL (ref 0.40–1.20)
Glucose, Bld: 82 mg/dL (ref 70–99)
HCT: 42.3 % (ref 36.0–46.0)
Hemoglobin: 13.9 g/dL (ref 12.0–15.0)
MCV: 81.2 fL (ref 78.0–100.0)
Platelets: 205 10*3/uL (ref 150–400)
RBC: 5.21 M/uL — ABNORMAL HIGH (ref 3.87–5.11)
WBC: 6.7 10*3/uL (ref 4.0–10.5)

## 2010-02-14 ENCOUNTER — Telehealth: Payer: Self-pay | Admitting: Internal Medicine

## 2010-02-18 ENCOUNTER — Ambulatory Visit: Payer: Self-pay | Admitting: Cardiology

## 2010-03-18 ENCOUNTER — Encounter: Payer: Self-pay | Admitting: Cardiovascular Disease

## 2010-03-18 ENCOUNTER — Ambulatory Visit: Payer: Self-pay | Admitting: Internal Medicine

## 2010-03-18 DIAGNOSIS — R413 Other amnesia: Secondary | ICD-10-CM | POA: Insufficient documentation

## 2010-03-18 LAB — CONVERTED CEMR LAB
POC INR: 2.37
Prothrombin Time: 26 s

## 2010-03-19 ENCOUNTER — Encounter: Payer: Self-pay | Admitting: Cardiovascular Disease

## 2010-03-19 ENCOUNTER — Encounter: Payer: Self-pay | Admitting: Internal Medicine

## 2010-03-19 LAB — CONVERTED CEMR LAB: Prothrombin Time: 26 s — ABNORMAL HIGH (ref 11.6–15.2)

## 2010-03-20 ENCOUNTER — Encounter: Payer: Self-pay | Admitting: Internal Medicine

## 2010-03-23 HISTORY — PX: BREAST BIOPSY: SHX20

## 2010-03-23 LAB — HM MAMMOGRAPHY

## 2010-03-25 ENCOUNTER — Ambulatory Visit: Payer: Self-pay | Admitting: Internal Medicine

## 2010-04-02 ENCOUNTER — Encounter: Admission: RE | Admit: 2010-04-02 | Discharge: 2010-04-02 | Payer: Self-pay | Admitting: Obstetrics & Gynecology

## 2010-04-09 ENCOUNTER — Encounter: Payer: Self-pay | Admitting: Internal Medicine

## 2010-04-09 ENCOUNTER — Ambulatory Visit: Payer: Self-pay | Admitting: Oncology

## 2010-04-09 ENCOUNTER — Telehealth: Payer: Self-pay | Admitting: Internal Medicine

## 2010-04-11 ENCOUNTER — Ambulatory Visit (HOSPITAL_BASED_OUTPATIENT_CLINIC_OR_DEPARTMENT_OTHER): Admission: RE | Admit: 2010-04-11 | Discharge: 2010-04-11 | Payer: Self-pay | Admitting: Internal Medicine

## 2010-04-11 ENCOUNTER — Ambulatory Visit: Payer: Self-pay | Admitting: Diagnostic Radiology

## 2010-04-11 ENCOUNTER — Ambulatory Visit: Payer: Self-pay | Admitting: Internal Medicine

## 2010-04-11 ENCOUNTER — Encounter: Payer: Self-pay | Admitting: Internal Medicine

## 2010-04-11 DIAGNOSIS — C50919 Malignant neoplasm of unspecified site of unspecified female breast: Secondary | ICD-10-CM

## 2010-04-11 HISTORY — DX: Malignant neoplasm of unspecified site of unspecified female breast: C50.919

## 2010-04-12 ENCOUNTER — Telehealth: Payer: Self-pay | Admitting: Internal Medicine

## 2010-04-15 ENCOUNTER — Telehealth: Payer: Self-pay | Admitting: Internal Medicine

## 2010-04-15 ENCOUNTER — Ambulatory Visit: Payer: Self-pay | Admitting: Internal Medicine

## 2010-04-16 ENCOUNTER — Ambulatory Visit: Payer: Self-pay | Admitting: Cardiology

## 2010-04-17 ENCOUNTER — Encounter: Payer: Self-pay | Admitting: Internal Medicine

## 2010-04-17 LAB — COMPREHENSIVE METABOLIC PANEL
ALT: 24 U/L (ref 0–35)
AST: 27 U/L (ref 0–37)
Albumin: 3.5 g/dL (ref 3.5–5.2)
Alkaline Phosphatase: 51 U/L (ref 39–117)
Potassium: 3.8 mEq/L (ref 3.5–5.3)
Sodium: 142 mEq/L (ref 135–145)
Total Protein: 6.5 g/dL (ref 6.0–8.3)

## 2010-04-17 LAB — CBC WITH DIFFERENTIAL/PLATELET
EOS%: 3 % (ref 0.0–7.0)
Eosinophils Absolute: 0.2 10*3/uL (ref 0.0–0.5)
MCV: 82.3 fL (ref 79.5–101.0)
MONO%: 10.1 % (ref 0.0–14.0)
NEUT#: 2.5 10*3/uL (ref 1.5–6.5)
RBC: 4.95 10*6/uL (ref 3.70–5.45)
RDW: 14.8 % — ABNORMAL HIGH (ref 11.2–14.5)

## 2010-04-19 ENCOUNTER — Encounter (HOSPITAL_COMMUNITY)
Admission: RE | Admit: 2010-04-19 | Discharge: 2010-06-22 | Payer: Self-pay | Source: Home / Self Care | Attending: Internal Medicine | Admitting: Internal Medicine

## 2010-04-22 ENCOUNTER — Telehealth: Payer: Self-pay | Admitting: Internal Medicine

## 2010-05-07 ENCOUNTER — Ambulatory Visit: Payer: Self-pay | Admitting: Cardiovascular Disease

## 2010-05-13 ENCOUNTER — Telehealth: Payer: Self-pay | Admitting: Internal Medicine

## 2010-05-17 ENCOUNTER — Ambulatory Visit: Payer: Self-pay | Admitting: Oncology

## 2010-05-21 ENCOUNTER — Other Ambulatory Visit: Payer: Self-pay | Admitting: Oncology

## 2010-05-21 LAB — PROTIME-INR
INR: 2.5 (ref 2.00–3.50)
Protime: 30 Seconds — ABNORMAL HIGH (ref 10.6–13.4)

## 2010-05-22 ENCOUNTER — Other Ambulatory Visit: Payer: Self-pay | Admitting: Physician Assistant

## 2010-05-22 ENCOUNTER — Ambulatory Visit (HOSPITAL_COMMUNITY)
Admission: RE | Admit: 2010-05-22 | Discharge: 2010-05-23 | Payer: Self-pay | Source: Home / Self Care | Admitting: Surgery

## 2010-05-22 ENCOUNTER — Encounter (INDEPENDENT_AMBULATORY_CARE_PROVIDER_SITE_OTHER): Payer: Self-pay | Admitting: Surgery

## 2010-05-22 LAB — PROTIME-INR: Protime: 16.8 Seconds — ABNORMAL HIGH (ref 10.6–13.4)

## 2010-05-26 ENCOUNTER — Observation Stay (HOSPITAL_COMMUNITY)
Admission: EM | Admit: 2010-05-26 | Discharge: 2010-05-27 | Payer: Self-pay | Source: Home / Self Care | Admitting: Emergency Medicine

## 2010-05-27 ENCOUNTER — Encounter: Payer: Self-pay | Admitting: Internal Medicine

## 2010-05-29 ENCOUNTER — Encounter: Payer: Self-pay | Admitting: Internal Medicine

## 2010-05-29 ENCOUNTER — Other Ambulatory Visit: Payer: Self-pay | Admitting: Oncology

## 2010-05-29 LAB — CBC WITH DIFFERENTIAL/PLATELET
BASO%: 0.2 % (ref 0.0–2.0)
HCT: 35.4 % (ref 34.8–46.6)
LYMPH%: 20.4 % (ref 14.0–49.7)
MCHC: 32.8 g/dL (ref 31.5–36.0)
MCV: 80.8 fL (ref 79.5–101.0)
MONO#: 0.9 10*3/uL (ref 0.1–0.9)
MONO%: 8.5 % (ref 0.0–14.0)
NEUT%: 66.2 % (ref 38.4–76.8)
Platelets: 171 10*3/uL (ref 145–400)
RBC: 4.38 10*6/uL (ref 3.70–5.45)
WBC: 10 10*3/uL (ref 3.9–10.3)
nRBC: 0 % (ref 0–0)

## 2010-05-30 ENCOUNTER — Encounter: Payer: Self-pay | Admitting: Internal Medicine

## 2010-05-31 ENCOUNTER — Other Ambulatory Visit: Payer: Self-pay | Admitting: Physician Assistant

## 2010-05-31 LAB — CBC WITH DIFFERENTIAL/PLATELET
Eosinophils Absolute: 0.5 10*3/uL (ref 0.0–0.5)
HCT: 31.1 % — ABNORMAL LOW (ref 34.8–46.6)
HGB: 10.2 g/dL — ABNORMAL LOW (ref 11.6–15.9)
LYMPH%: 37.2 % (ref 14.0–49.7)
MONO#: 0.7 10*3/uL (ref 0.1–0.9)
NEUT#: 3.6 10*3/uL (ref 1.5–6.5)
NEUT%: 47.5 % (ref 38.4–76.8)
Platelets: 202 10*3/uL (ref 145–400)
WBC: 7.5 10*3/uL (ref 3.9–10.3)

## 2010-06-03 ENCOUNTER — Other Ambulatory Visit: Payer: Self-pay | Admitting: Oncology

## 2010-06-03 ENCOUNTER — Encounter: Payer: Self-pay | Admitting: Internal Medicine

## 2010-06-03 LAB — CBC WITH DIFFERENTIAL/PLATELET
Eosinophils Absolute: 0.4 10*3/uL (ref 0.0–0.5)
HCT: 32.4 % — ABNORMAL LOW (ref 34.8–46.6)
LYMPH%: 38.3 % (ref 14.0–49.7)
MONO#: 0.5 10*3/uL (ref 0.1–0.9)
NEUT#: 2.9 10*3/uL (ref 1.5–6.5)
NEUT%: 46.9 % (ref 38.4–76.8)
Platelets: 278 10*3/uL (ref 145–400)
RBC: 3.89 10*6/uL (ref 3.70–5.45)
WBC: 6.3 10*3/uL (ref 3.9–10.3)
lymph#: 2.4 10*3/uL (ref 0.9–3.3)

## 2010-06-03 LAB — PROTHROMBIN TIME: Prothrombin Time: 21.5 seconds — ABNORMAL HIGH (ref 11.6–15.2)

## 2010-06-05 LAB — HYPERCOAGULABLE PANEL, COMPREHENSIVE
AntiThromb III Func: 106 % (ref 76–126)
AntiThromb III Func: 106 % (ref 76–126)
Anticardiolipin IgA: 4 APL U/mL (ref <22)
Anticardiolipin IgG: 8 GPL U/mL (ref <23)
Anticardiolipin IgG: 8 GPL U/mL (ref <23)
Anticardiolipin IgM: 2 MPL U/mL (ref <11)
Anticardiolipin IgM: 2 MPL U/mL (ref <11)
Beta-2 Glyco I IgG: 0 G Units (ref <20)
Beta-2 Glyco I IgG: 0 G Units (ref <20)
Beta-2-Glycoprotein I IgA: 2 A Units (ref <20)
Beta-2-Glycoprotein I IgA: 2 A Units (ref <20)
Beta-2-Glycoprotein I IgM: 0 M Units (ref <20)
DRVVT 1:1 Mix: 43.1 secs (ref 36.2–44.3)
PTT Lupus Anticoagulant: 46.7 secs — ABNORMAL HIGH (ref 30.0–45.6)
PTT Lupus Anticoagulant: 46.7 secs — ABNORMAL HIGH (ref 30.0–45.6)
PTTLA 4:1 Mix: 40.7 secs (ref 30.0–45.6)
PTTLA 4:1 Mix: 40.7 secs (ref 30.0–45.6)
Protein C Activity: 15 % — ABNORMAL LOW (ref 75–133)
Protein S Ag, Total: 70 % (ref 70–140)

## 2010-06-06 ENCOUNTER — Ambulatory Visit: Payer: Self-pay

## 2010-06-06 ENCOUNTER — Ambulatory Visit: Payer: Self-pay | Admitting: Internal Medicine

## 2010-06-06 LAB — CONVERTED CEMR LAB: POC INR: 2.4

## 2010-06-07 ENCOUNTER — Telehealth: Payer: Self-pay | Admitting: Internal Medicine

## 2010-06-19 ENCOUNTER — Telehealth: Payer: Self-pay | Admitting: Internal Medicine

## 2010-06-25 ENCOUNTER — Ambulatory Visit
Admission: RE | Admit: 2010-06-25 | Discharge: 2010-06-25 | Payer: Self-pay | Source: Home / Self Care | Attending: Internal Medicine | Admitting: Internal Medicine

## 2010-06-25 ENCOUNTER — Encounter: Payer: Self-pay | Admitting: Internal Medicine

## 2010-06-25 LAB — CONVERTED CEMR LAB
BUN: 20 mg/dL (ref 6–23)
Chloride: 108 meq/L (ref 96–112)
Hemoglobin: 12.3 g/dL (ref 12.0–15.0)
MCHC: 32.2 g/dL (ref 30.0–36.0)
Potassium: 5 meq/L (ref 3.5–5.3)
RBC: 4.68 M/uL (ref 3.87–5.11)
RDW: 15.4 % (ref 11.5–15.5)
Sodium: 145 meq/L (ref 135–145)
TSH: 0.142 microintl units/mL — ABNORMAL LOW (ref 0.350–4.500)
Vit D, 1,25-Dihydroxy: 34 (ref 30–89)

## 2010-06-26 ENCOUNTER — Encounter (INDEPENDENT_AMBULATORY_CARE_PROVIDER_SITE_OTHER): Payer: Self-pay | Admitting: *Deleted

## 2010-07-01 ENCOUNTER — Telehealth: Payer: Self-pay | Admitting: Internal Medicine

## 2010-07-04 ENCOUNTER — Ambulatory Visit
Admission: RE | Admit: 2010-07-04 | Discharge: 2010-07-04 | Payer: Self-pay | Source: Home / Self Care | Attending: Internal Medicine | Admitting: Internal Medicine

## 2010-07-08 ENCOUNTER — Encounter: Payer: Self-pay | Admitting: Internal Medicine

## 2010-07-09 ENCOUNTER — Ambulatory Visit
Admission: RE | Admit: 2010-07-09 | Discharge: 2010-07-09 | Payer: Self-pay | Source: Home / Self Care | Attending: Internal Medicine | Admitting: Internal Medicine

## 2010-07-09 ENCOUNTER — Encounter: Payer: Self-pay | Admitting: Internal Medicine

## 2010-07-13 ENCOUNTER — Other Ambulatory Visit: Payer: Self-pay | Admitting: Oncology

## 2010-07-13 ENCOUNTER — Encounter: Payer: Self-pay | Admitting: Internal Medicine

## 2010-07-13 DIAGNOSIS — Z780 Asymptomatic menopausal state: Secondary | ICD-10-CM

## 2010-07-14 ENCOUNTER — Encounter: Payer: Self-pay | Admitting: Orthopaedic Surgery

## 2010-07-14 ENCOUNTER — Encounter: Payer: Self-pay | Admitting: Internal Medicine

## 2010-07-15 ENCOUNTER — Encounter: Payer: Self-pay | Admitting: Internal Medicine

## 2010-07-18 ENCOUNTER — Ambulatory Visit: Admission: RE | Admit: 2010-07-18 | Discharge: 2010-07-18 | Payer: Self-pay | Source: Home / Self Care

## 2010-07-18 LAB — CONVERTED CEMR LAB: POC INR: 2.5

## 2010-07-23 NOTE — Progress Notes (Signed)
Summary: Injection needed/Coumadin directions   Phone Note From Other Clinic   Summary of Call: ---- Converted from flag ---- ---- 09/14/2009 3:00 PM, Cloyde Reams RN wrote: Dr Alvester Morin wants pt to have an epidural injection.  Requesting pt come off coumadin x 5 days prior to injection.  Please advise if ok to d/c coumadin x5 days prior to procedure.  Thanks ------------------------------ Ok to come off coumadin x 5 days before epidural injection Initial call taken by: D. Thomos Lemons DO,  September 17, 2009 1:01 PM  Follow-up for Phone Call        I will forward note to Cloyde Reams Follow-up by: D. Thomos Lemons DO,  September 17, 2009 3:01 PM  Additional Follow-up for Phone Call Additional follow up Details #1::        patient has been advised that it is okay to d/c coumadin 5 days prior to scheduled injection Additional Follow-up by: Glendell Docker CMA,  September 17, 2009 3:57 PM    Additional Follow-up for Phone Call Additional follow up Details #2::    Called spoke with pt's husband advised OK to d/c coumadin 5 days prior to injection.  Advised to call back for appt once procedure appt scheduled.   Follow-up by: Cloyde Reams RN,  September 18, 2009 9:20 AM

## 2010-07-23 NOTE — Medication Information (Signed)
Summary: rov/eac  Anticoagulant Therapy  Managed by: Eda Keys, PharmD PCP: Dondra Spry DO Supervising MD: Eden Emms MD, Theron Arista Indication 1: Deep Vein Thrombosis - Leg (ICD-451.1) Lab Used: [facility] [place] Site: [address] INR POC 1.9 INR RANGE 2 - 3  Dietary changes: no    Health status changes: no    Bleeding/hemorrhagic complications: no    Recent/future hospitalizations: no    Any changes in medication regimen? no    Recent/future dental: no  Any missed doses?: no       Is patient compliant with meds? yes       Allergies: 1)  ! Pcn 2)  Morphine Sulfate (Morphine Sulfate) 3)  Penicillin G Potassium (Penicillin G Potassium) 4)  Sertraline Hcl (Sertraline Hcl)  Anticoagulation Management History:      The patient is taking warfarin and comes in today for a routine follow up visit.  Positive risk factors for bleeding include an age of 75 years or older.  The bleeding index is 'intermediate risk'.  Positive CHADS2 values include History of HTN and Age > 75 years old.  The start date was 05/28/2007.  Her last INR was 2.4.  Anticoagulation responsible provider: Eden Emms MD, Theron Arista.  INR POC: 1.9.  Cuvette Lot#: [id number].  Exp: 01/2011.    Anticoagulation Management Assessment/Plan:      The patient's current anticoagulation dose is Coumadin 5 mg  tabs: Take as directed by coumadin clinic..  The target INR is 2.0-3.0.  The next INR is due 11/13/2009.  Anticoagulation instructions were given to patient.  Results were reviewed/authorized by Eda Keys, PharmD.  She was notified by Eda Keys.         Prior Anticoagulation Instructions: INR 1.0  Take 2 tablets today. Then start NEW dosing schedule of 1.5 tablets on Wednesday and Saturday.  Return to clinic in 1 week.    Current Anticoagulation Instructions: INR 1.9  Take 1.5 tablets today.  Then return to normal dosing schedule of 1.5 tablets on Wednesday and Saturday, and 1 tablet all other  days.  Return to clinic in 2 weeks.

## 2010-07-23 NOTE — Letter (Signed)
Summary: Primary Care Consult Scheduled Letter  [place] [facility]  [address]   [place], [place] [address]   Phone: [phone]  Fax: [phone]      09/05/2009 MRN: [id number]  [facility] [address] [place], [place]  [address]    Dear Jennifer Brennan,      We have scheduled an appointment for you.  At the recommendation of Dr._YOO, we have scheduled you a consult with DR Jennelle Human NEUROLOGIC  on _APRIL 13,2011 at _1:30PM.  Their address is_[address], [place] . The office phone number is [phone].  If this appointment day and time is not convenient for you, please feel free to call the office of the doctor you are being referred to at the number listed above and reschedule the appointment.     It is important for you to keep your scheduled appointments. We are here to make sure you are given good patient care. If you have questions or you have made changes to your appointment, please notify us at  [phone], ask for [name].    Thank you,  Patient Care Coordinator [place] [facility]

## 2010-07-23 NOTE — Progress Notes (Signed)
Summary: Status Update  Phone Note Call from Patient Call back at Home Phone [phone] Call back at [phone]-[name]-Cell   Caller: Daughter-[name] Call For: D. Thomos Lemons DO Summary of Call: patients daughter called and left voice message requesting a return phone call. patient wanted to give Dr Artist Pais a status update on her condition. Call was returned to patient, her daughter states patient was seen by gynecologist,  had a mammogram done at [facility]. A lump was found in her right breast, and she had a follow up ultrasound done of  her right breast. She states patient was diagnosed with cancer. She saw the surgeon today and was advised to have her breast removed. Surgery date is pending.  She wanted to know what was going on. Patients daughter would like to know if she could get a second opinion. Initial call taken by: Glendell Docker CMA,  April 09, 2010 3:48 PM  Follow-up for Phone Call        I suggest OV Follow-up by: D. Thomos Lemons DO,  April 09, 2010 5:41 PM  Additional Follow-up for Phone Call Additional follow up Details #1::        call returned to patient, she was advised per Dr Artist Pais instructions. Appointment scheduled for 10/20 @ 3:30p with Dr Artist Pais Additional Follow-up by: Glendell Docker CMA,  April 10, 2010 9:59 AM

## 2010-07-23 NOTE — Assessment & Plan Note (Signed)
Summary: FOLLOW UP FROM 3/1/DK   Vital Signs:  Patient profile:   77 year old female Weight:      151.50 pounds O2 Sat:      93 % on Room air Temp:     97.5 degrees F oral Pulse rate:   66 / minute Pulse rhythm:   regular Resp:     18 per minute BP sitting:   112 / 70  (left arm) Cuff size:   regular  Vitals Entered By: Glendell Docker CMA (August 24, 2009 3:58 PM)  O2 Flow:  Room air CC: RM 3 Follow up  Comments has a follow up appointment with Dr [name] on Monday for evaluation of her hip, leg and back, some improvement, but cough is unresolved, daughter states that patient is havign severe skin irritation all over her body.   Primary Care Provider:  Dondra Spry DO  CC:  RM 3 Follow up .  History of Present Illness: 77 y/o AA female for follow up pt c/o persistent right leg pain. she can not tolerate compression stocking touch her leg  she had to stop lexapro due to rash rash assoc with pruritus mild improvement since stopping lexapro  Allergies: 1)  ! Pcn 2)  Morphine Sulfate (Morphine Sulfate) 3)  Penicillin G Potassium (Penicillin G Potassium) 4)  Sertraline Hcl (Sertraline Hcl)  Past History:  Past Medical History: Hypertension Hypothyroidism Seizure disorder      Cerebrovascular accident, hx of   History of DVT - Right   Anticoagulation therapy   severe glaucoma    Depression / anxiety  Past Surgical History: Hysterectomy Rt Hip Replacement     B/L laser eye surgery         Social History: Retired  Married     Never Smoked    Alcohol use-no           Review of Systems       intermittent cough  Physical Exam  General:  alert, well-developed, and well-nourished.   Head:  normocephalic and atraumatic.   Mouth:  Oral mucosa and oropharynx without lesions or exudates.  Neck:  No deformities, masses, or tenderness noted.no carotid bruits.   Lungs:  normal respiratory effort and normal breath sounds.   Heart:  normal rate, regular rhythm,  and no gallop.   Extremities:  1+ left pedal edema and trace right pedal edema.   Skin:  macular red rash right thigh,  chronic venous stasis dermatitis right lower ext Psych:  normally interactive.     Impression & Recommendations:  Problem # 1:  LEG PAIN, RIGHT (ICD-729.5) Pt with persistent right leg pain.  consider RSD.  refer to neuro for eval Orders: Neurology Referral (Neuro)  Problem # 2:  SKIN RASH, ALLERGIC (ICD-692.9) Question drug rash.  Not clear whether rash is from SSRI or other.  DC enalapril.  substitute with diovan The following medications were removed from the medication list:    Prednisone 10 Mg Tabs (Prednisone) .Marland KitchenMarland KitchenMarland KitchenMarland Kitchen 4 tabs by mouth today, then 3 tabs on 3/2, then 2 tabson 3/3 then one tab on 3/4 then stop Her updated medication list for this problem includes:    Loratadine 10 Mg Tabs (Loratadine) ..... One by mouth qd  Problem # 3:  BRONCHITIS, MILD (ICD-490) Assessment: Improved I suspect cough may be exacerbated by ACE.  DC enalapril  Her updated medication list for this problem includes:    Zithromax Z-pak 250 Mg Tabs (Azithromycin) .Marland Kitchen... 2 tabs by  mouth today, then one tablet by mouth daily x 4 more days    Tessalon Perles 100 Mg Caps (Benzonatate) ..... One cap by mouth three times a day as needed cough    Hydrocodone-homatropine 5-1.5 Mg/19ml Syrp (Hydrocodone-homatropine) .Marland KitchenMarland KitchenMarland KitchenMarland Kitchen 5 ml by mouth qpm prn  Problem # 4:  HYPERTENSION (ICD-401.9) change  enalapril to diovan  The following medications were removed from the medication list:    Enalapril Maleate 5 Mg Tabs (Enalapril maleate) ..... One by mouth qd Her updated medication list for this problem includes:    Nifedical Xl 30 Mg Tb24 (Nifedipine) ..... One by mouth once daily    Diovan 80 Mg Tabs (Valsartan) ..... One by mouth once daily  BP today: 112/70 Prior BP: 96/54 (08/21/2009)  Prior 10 Yr Risk Heart Disease: Not enough information (12/05/2008)  Labs Reviewed: K+: 4.2  (07/02/2009) Creat: : 1.2 (07/02/2009)     Complete Medication List: 1)  Gabapentin 300 Mg Caps (Gabapentin) .... One by mouth in am and one tab at bedtime 2)  Synthroid 100 Mcg Tabs (Levothyroxine sodium) .... One by mouth qd 3)  Cosopt 2-0.5 % Soln (Dorzolamide-timolol) .... One drop each eye two times a day 4)  Xalatan 0.005 % Soln (Latanoprost) .... One drop each eye at bedtime 5)  Nifedical Xl 30 Mg Tb24 (Nifedipine) .... One by mouth once daily 6)  Coumadin 5 Mg Tabs (Warfarin sodium) .... Take as directed by coumadin clinic. 7)  Piloptic-1 1 % Soln (Pilocarpine hcl) .... One drop each eye four times a day 8)  Caltrate 600+d Plus 600-400 Mg-unit Chew (Calcium carbonate-vit d-min) .... One by mouth bid 9)  Loratadine 10 Mg Tabs (Loratadine) .... One by mouth qd 10)  Vitamin D 2000 Unit Caps (Cholecalciferol) .... One by mouth once daily 11)  Freestyle Lite Test Strp (Glucose blood) .... Use to test blood sugar once daily (dx code 790.29) 12)  Freestyle Lancets Misc (Lancets) .... Use to test blood sugar once daily (dx code 790.29) 13)  Sanctura Xr 60 Mg Xr24h-cap (Trospium chloride) .... Take 1 tablet by mouth once a day 14)  Zithromax Z-pak 250 Mg Tabs (Azithromycin) .... 2 tabs by mouth today, then one tablet by mouth daily x 4 more days 15)  Tessalon Perles 100 Mg Caps (Benzonatate) .... One cap by mouth three times a day as needed cough 16)  Alphagan P 0.15 % Soln (Brimonidine tartrate) .Marland Kitchen.. 1 drop left eye  every morning 17)  Hydrocodone-homatropine 5-1.5 Mg/35ml Syrp (Hydrocodone-homatropine) .... 5 ml by mouth qpm prn 18)  Diovan 80 Mg Tabs (Valsartan) .... One by mouth once daily  Patient Instructions: 1)  Please schedule a follow-up appointment in 2 months. Prescriptions: DIOVAN 80 MG TABS (VALSARTAN) one by mouth once daily  #30 x 3   Entered and Authorized by:   D. Thomos Lemons DO   Signed by:   D. Thomos Lemons DO on 08/24/2009   Method used:   Electronically to        [facility] [address] (retail)       [address]       [place], [place]  [id number]       Ph: [phone]       Fax: [phone]   RxID:   [id number] HYDROCODONE-HOMATROPINE 5-1.5 MG/5ML SYRP (HYDROCODONE-HOMATROPINE) 5 ml by mouth qpm prn  #60 ml x 0   Entered and Authorized by:   D. Thomos Lemons DO   Signed by:  Jennifer Spry DO on 08/24/2009   Method used:   Print then Give to Patient   RxID:   [id number]

## 2010-07-23 NOTE — Assessment & Plan Note (Signed)
Summary: pt is requesting a ckup/dt   Vital Signs:  Patient profile:   77 year old female Weight:      154.25 pounds BMI:     30.23 O2 Sat:      92 % on Room air Temp:     98.1 degrees F oral Pulse rate:   61 / minute Pulse rhythm:   regular Resp:     16 per minute BP sitting:   122 / 70  (left arm) Cuff size:   large  Vitals Entered By: Glendell Docker CMA (February 12, 2010 3:33 PM)  O2 Flow:  Room air CC: follow-up visit Is Patient Diabetic? No Pain Assessment Patient in pain? no        Primary Care Provider:  Dondra Spry DO  CC:  follow-up visit.  History of Present Illness: 77 y/o AA female for f/u pt accompanied by her daughter she had episode of incontinence at church this upset pt.  she takes overactive bladder medication she drank more fluids than usual  overactive bladder - less urgency   Preventive Screening-Counseling & Management  Alcohol-Tobacco     Smoking Status: quit  Allergies: 1)  ! Pcn 2)  Morphine Sulfate (Morphine Sulfate) 3)  Penicillin G Potassium (Penicillin G Potassium) 4)  Sertraline Hcl (Sertraline Hcl)  Past History:  Past Medical History: Hypertension Hypothyroidism  Seizure disorder       Cerebrovascular accident, hx of   History of DVT - Right   Anticoagulation therapy   severe glaucoma    Depression / anxiety Overactive bladder  Social History: Retired  Married      Never Smoked    Alcohol use-no           Smoking Status:  quit  Physical Exam  General:  alert, well-developed, and well-nourished.   Lungs:  normal respiratory effort and normal breath sounds.   Heart:  normal rate, regular rhythm, and no gallop.   Extremities:  trace left pedal edema and trace right pedal edema.   Neurologic:  cranial nerves II-XII intact.  unstable gait but no ataxia.  able to walk unassisted.   Impression & Recommendations:  Problem # 1:  OVERACTIVE BLADDER (ICD-596.51) continue same dose of vesicare.  I suggest trial  of behavioral modification (scheduled voids) If persistent symptoms, consider higher dose of vesicare  Problem # 2:  HYPERTENSION (ICD-401.9) Assessment: Unchanged  Her updated medication list for this problem includes:    Nifedical Xl 30 Mg Tb24 (Nifedipine) ..... One by mouth once daily    Diovan 80 Mg Tabs (Valsartan) ..... One by mouth once daily  Orders: T-Basic Metabolic Panel [id number])  BP today: 122/70 Prior BP: 122/60 (09/17/2009)  Prior 10 Yr Risk Heart Disease: Not enough information (12/05/2008)  Labs Reviewed: K+: 4.2 (07/02/2009) Creat: : 1.2 (07/02/2009)     Complete Medication List: 1)  Gabapentin 300 Mg Caps (Gabapentin) .... One by mouth in am and one tab at bedtime 2)  Synthroid 112 Mcg Tabs (Levothyroxine sodium) .... One by mouth once daily 3)  Cosopt 2-0.5 % Soln (Dorzolamide-timolol) .... One drop each eye two times a day 4)  Xalatan 0.005 % Soln (Latanoprost) .... One drop each eye at bedtime 5)  Nifedical Xl 30 Mg Tb24 (Nifedipine) .... One by mouth once daily 6)  Coumadin 5 Mg Tabs (Warfarin sodium) .... Take as directed by coumadin clinic. 7)  Piloptic-1 1 % Soln (Pilocarpine hcl) .... One drop each eye four times a day 8)  Caltrate 600+d Plus 600-400 Mg-unit Chew (Calcium carbonate-vit d-min) .... One by mouth bid 9)  Freestyle Lite Test Strp (Glucose blood) .... Use to test blood sugar once daily (dx code 790.29) 10)  Vesicare 5 Mg Tabs (Solifenacin succinate) .... One by mouth once daily 11)  Alphagan P 0.15 % Soln (Brimonidine tartrate) .Marland Kitchen.. 1 drop left eye  every morning 12)  Diovan 80 Mg Tabs (Valsartan) .... One by mouth once daily  Other Orders: T-TSH ([id number]) T-CBC No Diff ([id number])  Patient Instructions: 1)  Please schedule a follow-up appointment in 6 months.  Current Allergies (reviewed today): ! PCN MORPHINE SULFATE (MORPHINE SULFATE) PENICILLIN G POTASSIUM (PENICILLIN G POTASSIUM) SERTRALINE HCL (SERTRALINE HCL)

## 2010-07-23 NOTE — Progress Notes (Signed)
Summary: Follow up appointment  Phone Note Outgoing Call   Summary of Call: Pls call patient and arrange follow up appointment for Friday with Dr. Artist Pais.  If he is full, please book her with me.  Initial call taken by: Lemont Fillers FNP,  August 21, 2009 5:21 PM  Follow-up for Phone Call        call placed to patient at [phone], spoke with patient husband follow up appointment scheduled for Friday 3/4 @ 3pm Follow-up by: Glendell Docker CMA,  August 22, 2009 9:27 AM

## 2010-07-23 NOTE — Medication Information (Signed)
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 Dr. Darnelle Catalan told her to stop coumadin on the 26th and they will bridge her.   Any changes in medication regimen? no    Recent/future dental: no  Any missed doses?: yes     Details: missed a dose on the 12th  Is patient compliant with meds? yes      Comments: [facility] told her they were going to bridge her with lovenox. Going to flag Dr. Artist Pais and make sure she is to be bridged and managed perioperatively by Dr. Darnelle Catalan at the cancer center and to make sure it's ok that she goes off coumadin. Patient's daugher says they are supposed to get coagulation instructions on the 26th when she goes for lab work.   Allergies: 1)  ! Pcn 2)  Morphine Sulfate (Morphine Sulfate) 3)  Penicillin G Potassium (Penicillin G Potassium) 4)  Sertraline Hcl (Sertraline Hcl)  Anticoagulation Management History:      The patient is taking warfarin and comes in today for a routine follow up visit.  Positive risk factors for bleeding include an age of 75 years or older.  The bleeding index is 'intermediate risk'.  Positive CHADS2 values include History of HTN and Age > 77 years old.  The start date was 05/28/2007.  Her last INR was 2.37.  Anticoagulation responsible provider: Excell Seltzer MD, Casimiro Needle.  INR POC: 3.  Cuvette Lot#: [id number].  Exp: 05/2011.    Anticoagulation Management Assessment/Plan:      The patient's current anticoagulation dose is Coumadin 5 mg  tabs: Take as directed by coumadin clinic..  The target INR is 2.0-3.0.  The next INR is due  05/28/2010.  Anticoagulation instructions were given to patient.  Results were reviewed/authorized by Lyna Poser, PharmD.         Prior Anticoagulation Instructions: INR 3.3 Skip today's dose then resume 5mg s daily except 7.5mg s on Wednesdays and Saturdays. Recheck in 3 weeks.   Current Anticoagulation Instructions: INR 3 Continue taking 1.5 tablets on wednesday and saturday. And 1 tablet all other days. Recheck in 3 weeks.

## 2010-07-23 NOTE — Assessment & Plan Note (Signed)
Summary: 2wk. f/u - jr   Vital Signs:  Patient profile:   77 year old female Weight:      147.75 pounds O2 Sat:      97 % on Room air Temp:     98.2 degrees F oral Pulse rate:   50 / minute Pulse rhythm:   regular Resp:     16 per minute BP sitting:   122 / 70  (right arm) Cuff size:   large  Vitals Entered By: Glendell Docker CMA (July 20, 2009 2:23 PM)  O2 Flow:  Room air   Primary Care Provider:  D. Thomos Lemons DO  CC:  2 Week follow up.  History of Present Illness: 2 Week Follow up  77 y/o AA female for follow up.  Pt notes no improvement in vision since starting prednisone.  no hx of jaw claudication or symptoms of PMR.   mild elevation of blood sugar while on prednisone.  she completed full course.  pt daugterh notes mood is getting worse with progressive decline in her vision.  initial improvement with citalopram but now feels like effects have worn off  Allergies: 1)  ! Pcn 2)  Morphine Sulfate (Morphine Sulfate) 3)  Penicillin G Potassium (Penicillin G Potassium)  Past History:  Past Medical History: Hypertension Hypothyroidism Seizure disorder      Cerebrovascular accident, hx of   History of DVT - Right  Anticoagulation therapy   severe glaucoma    Depression / anxiety  Past Surgical History: Hysterectomy Rt Hip Replacement    B/L laser eye surgery         Social History: Retired  Married    Never Smoked    Alcohol use-no           Review of Systems       no headaches, wt is fairly stable.  no anorexia  Physical Exam  General:  alert, well-developed, and well-nourished.   Lungs:  normal respiratory effort and normal breath sounds.   Heart:  normal rate, regular rhythm, and no gallop.   Msk:  right lower ext - chronic venous stasis changes.  various areas on shin tender Extremities:  1+ left pedal edema and trace right pedal edema.     Impression & Recommendations:  Problem # 1:  RETINAL ISCHEMIA (ICD-362.84) her opthalmologist  notes no retinal abnormality.  she has hx of significant glaucoma.  opthalmologist notes concern of retinal ischemia.  no response to prednisone. pt had minimal elevation in sed rate.   no carotid bruit but check carotid doppler.  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  she was seen by vascular surgeon for evaluation of thrombectomy.  they recommended continue compression stockings.  pt also advised to use warm compress  Problem # 3:  ANXIETY DEPRESSION (ICD-300.4) Assessment: Deteriorated daughter notes increase in depression since decline in her vision.  change citalopram to sertraline.  Patient advised to call office if symptoms persist or worsen.  Complete Medication List: 1)  Gabapentin 300 Mg Caps (Gabapentin) .... One by mouth in am and one tab at bedtime 2)  Synthroid 100 Mcg Tabs (Levothyroxine sodium) .... One by mouth qd 3)  Cosopt 2-0.5 % Soln (Dorzolamide-timolol) .... One drop each eye two times a day 4)  Xalatan 0.005 % Soln (Latanoprost) .... One drop each eye at bedtime 5)  Nifedical Xl 30 Mg Tb24 (Nifedipine) .Marland KitchenMarland KitchenMarland Kitchen  One by mouth once daily 6)  Coumadin 5 Mg Tabs (Warfarin sodium) .... Take as directed by coumadin clinic. 7)  Piloptic-1 1 % Soln (Pilocarpine hcl) .... One drop each eye four times a day 8)  Enalapril Maleate 5 Mg Tabs (Enalapril maleate) .... One by mouth qd 9)  Caltrate 600+d Plus 600-400 Mg-unit Chew (Calcium carbonate-vit d-min) .... One by mouth bid 10)  Sertraline Hcl 50 Mg Tabs (Sertraline hcl) .... One by mouth once daily 11)  Loratadine 10 Mg Tabs (Loratadine) .... One by mouth qd 12)  Prednisone 20 Mg Tabs (Prednisone) .... One by mouth bid 13)  Vitamin D 2000 Unit Caps (Cholecalciferol) .... One by mouth once daily 14)  Freestyle Lite Test Strp (Glucose blood) .... Use to test blood sugar once daily (dx code 790.29) 15)  Freestyle Lancets Misc  (Lancets) .... Use to test blood sugar once daily (dx code 790.29)  Patient Instructions: 1)  Please schedule a follow-up appointment in 2 months. 2)  use compression stockings 3)  use warm compress to right leg 2 x per day as needed Prescriptions: SERTRALINE HCL 50 MG TABS (SERTRALINE HCL) one by mouth once daily  #30 x 3   Entered and Authorized by:   D. Thomos Lemons DO   Signed by:   D. Thomos Lemons DO on 07/20/2009   Method used:   Electronically to        [facility] [address] (retail)       [address]       [place], [place]  [id number]       Ph: [phone]       Fax: [phone]   RxID:   [id number]   Current Allergies (reviewed today): ! PCN MORPHINE SULFATE (MORPHINE SULFATE) PENICILLIN G POTASSIUM (PENICILLIN G POTASSIUM)

## 2010-07-23 NOTE — Progress Notes (Signed)
Summary: Vesicare  ---- Converted from flag ---- ---- 09/18/2009 9:39 AM, D. Thomos Lemons DO wrote:   ---- 09/17/2009 2:56 PM, D. Thomos Lemons DO wrote: Please call her eye doctor and make sure he does not have any issues with her taking overactive bladder medication (Vesicare) ------------------------------  Phone Note Outgoing Call   Call placed by: Glendell Docker CMA,  September 18, 2009 9:42 AM Call placed to: Dr Eulah Pont [phone] Summary of Call: call placed to Dr Deveron Furlong office spoke with voice mail of Dr Deveron Furlong Technician  regarding Assunta Found, detailed voice message left for tech to return call Initial call taken by: Glendell Docker CMA,  September 18, 2009 9:45 AM  Follow-up for Phone Call        Heather from Dr Antony Contras office returned call and stated that it would be okay for patient to use Vesicare per Dr Eulah Pont Follow-up by: Glendell Docker CMA,  September 18, 2009 10:39 AM

## 2010-07-23 NOTE — Medication Information (Signed)
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 yes       Details: Completed 2 week course of Prednisone on Thursday. Change Celexa to Zoloft  Recent/future dental: no  Any missed doses?: no       Is patient compliant with meds? yes       Current Medications (verified): 1)  Gabapentin 300 Mg Caps (Gabapentin) .... One By Mouth in Am and One Tab At Bedtime 2)  Synthroid 100 Mcg Tabs (Levothyroxine Sodium) .... One By Mouth Qd 3)  Cosopt 2-0.5 %  Soln (Dorzolamide-Timolol) .... One Drop Each Eye Two Times A Day 4)  Xalatan 0.005 %  Soln (Latanoprost) .... One Drop Each Eye At Bedtime 5)  Nifedical Xl 30 Mg  Tb24 (Nifedipine) .... One By Mouth Once Daily 6)  Coumadin 5 Mg  Tabs (Warfarin Sodium) .... Take As Directed By Coumadin Clinic. 7)  Piloptic-1 1 %  Soln (Pilocarpine Hcl) .... One Drop Each Eye Four Times A Day 8)  Enalapril Maleate 5 Mg  Tabs (Enalapril Maleate) .... One By Mouth Qd 9)  Caltrate 600+d Plus 600-400 Mg-Unit  Chew (Calcium Carbonate-Vit D-Min) .... One By Mouth Bid 10)  Zoloft 50 Mg Tabs (Sertraline Hcl) .... Take 1/2 Tablet Everyday For 2 Weeks Then Take 1 Tablet Daily 11)  Loratadine 10 Mg Tabs (Loratadine) .... One By Mouth Qd 12)  Vitamin D 2000 Unit Caps (Cholecalciferol) .... One By Mouth Once Daily 13)  Freestyle Lite Test  Strp (Glucose Blood) .... Use To Test Blood Sugar Once Daily (Dx Code 790.29) 14)  Freestyle Lancets  Misc (Lancets) .... Use To Test Blood Sugar Once Daily (Dx Code  790.29)  Allergies: 1)  ! Pcn 2)  Morphine Sulfate (Morphine Sulfate) 3)  Penicillin G Potassium (Penicillin G Potassium)  Anticoagulation Management History:      The patient is taking warfarin and comes in today for a routine follow up visit.  Positive risk factors for bleeding include an age of 75 years or older.  The bleeding index is 'intermediate risk'.  Positive CHADS2 values include History of HTN and Age > 13 years old.  The start date was 05/28/2007.  Her last INR was 2.4.  Anticoagulation responsible provider: Kambrea Carrasco MD, Reuel Boom.  INR POC: 4.3.  Cuvette Lot#: [id number].  Exp: 04//2012.    Anticoagulation Management Assessment/Plan:      The patient's current anticoagulation dose is Coumadin 5 mg  tabs: Take as directed by coumadin clinic..  The target INR is 2.0-3.0.  The next INR is due 08/08/2009.  Anticoagulation instructions were given to patient.  Results were reviewed/authorized by Bethena Midget, RN, BSN.  She was notified by Bethena Midget, RN, BSN.         Prior Anticoagulation Instructions: INR 2.0 Today and tomorrow take 1 1/2 tablets then resume 1 tablet everyday except 1 1/2  tablets on Mondays. Recheck in 3-4 weeks.    Current Anticoagulation Instructions: INR 4.3 Skip coumadin dose today. Then resume 5mg s everyday except  on Mondays take 7.5mg s. Recheck in 2 weeks.

## 2010-07-23 NOTE — Medication Information (Signed)
Summary: rov/ewj  Anticoagulant Therapy  Managed by: Weston Brass, PharmD PCP: Dondra Spry DO Supervising MD: Graciela Husbands MD, [name] Indication 1: Deep Vein Thrombosis - Leg (ICD-451.1) Lab Used: [facility] [place] Site: [address] INR POC 1.7 INR RANGE 2 - 3  Dietary changes: no    Health status changes: yes       Details: pt had spinal injection recently (4/5); was off Coumadin x 5 days.    Bleeding/hemorrhagic complications: no    Recent/future hospitalizations: no    Any changes in medication regimen? no    Recent/future dental: no  Any missed doses?: yes     Details: was off Coumadin 5 days for procedure; restarted 4/6  Is patient compliant with meds? yes       Allergies: 1)  ! Pcn 2)  Morphine Sulfate (Morphine Sulfate) 3)  Penicillin G Potassium (Penicillin G Potassium) 4)  Sertraline Hcl (Sertraline Hcl)  Anticoagulation Management History:      The patient is taking warfarin and comes in today for a routine follow up visit.  Positive risk factors for bleeding include an age of 75 years or older.  The bleeding index is 'intermediate risk'.  Positive CHADS2 values include History of HTN and Age > 63 years old.  The start date was 05/28/2007.  Her last INR was 2.4.  Anticoagulation responsible provider: Graciela Husbands MD, [name].  INR POC: 1.7.  Cuvette Lot#: [id number].  Exp: 10/2010.    Anticoagulation Management Assessment/Plan:      The patient's current anticoagulation dose is Coumadin 5 mg  tabs: Take as directed by coumadin clinic..  The target INR is 2.0-3.0.  The next INR is due 10/19/2009.  Anticoagulation instructions were given to patient.  Results were reviewed/authorized by Weston Brass, PharmD.  She was notified by Weston Brass PharmD.         Prior Anticoagulation Instructions: INR 3.3  Skip tomorrow's dosage then start taking 1 tablet daily.  Recheck in 3 weeks.  Current Anticoagulation Instructions: INR 1.7  Increase dose to 1 tablet every day  except 1 1/2 tablets on Saturday

## 2010-07-23 NOTE — Letter (Signed)
Summary: Handout Printed  Printed Handout:  - Coumadin Instructions-w/out Meds 

## 2010-07-23 NOTE — Medication Information (Signed)
Summary: Jennifer Brennan  Anticoagulant Therapy  Managed by: Bethena Midget, RN, BSN PCP: Dondra Spry DO Supervising MD: Shirlee Latch MD, Ambriel Gorelick Indication 1: Deep Vein Thrombosis - Leg (ICD-451.1) Lab Used: [facility]  Site: [address] INR POC 3.3 INR RANGE 2 - 3  Dietary changes: no    Health status changes: no    Bleeding/hemorrhagic complications: no    Recent/future hospitalizations: no    Any changes in medication regimen? no    Recent/future dental: no  Any missed doses?: yes     Details: maybe one missed dose last week  Is patient compliant with meds? yes      Comments: Pending Mastectomy seeing Heme/Onco tomorrow  Allergies: 1)  ! Pcn 2)  Morphine Sulfate (Morphine Sulfate) 3)  Penicillin G Potassium (Penicillin G Potassium) 4)  Sertraline Hcl (Sertraline Hcl)  Anticoagulation Management History:      The patient is taking warfarin and comes in today for a routine follow up visit.  Positive risk factors for bleeding include an age of 71 years or older.  The bleeding index is 'intermediate risk'.  Positive CHADS2 values include History of HTN and Age > 50 years old.  The start date was 05/28/2007.  Her last INR was 2.37.  Anticoagulation responsible provider: Shirlee Latch MD, Kayode Petion.  INR POC: 3.3.  Cuvette Lot#: [id number].  Exp: 05/2011.    Anticoagulation Management Assessment/Plan:      The patient's current anticoagulation dose is Coumadin 5 mg  tabs: 5 mg  by mouth 5 days per week and 2.5 mg by mouth on Wednesday and Saturdays.  The target INR is 2.0-3.0.  The next INR is due 05/07/2010.  Anticoagulation instructions were given to patient.  Results were reviewed/authorized by Bethena Midget, RN, BSN.  She was notified by Bethena Midget, RN, BSN.         Prior Anticoagulation Instructions: INR 2.37  Spoke with pt.  Continue same dose of 1 tablet every day except 1 1/2 tablets on Wednesday and Saturday.   Recheck INR in 4 weeks.   Current Anticoagulation  Instructions: INR 3.3 Skip today's dose then resume 5mg s daily except 7.5mg s on Wednesdays and Saturdays. Recheck in 3 weeks.

## 2010-07-23 NOTE — Miscellaneous (Signed)
Summary: Orders Update  Clinical Lists Changes  Orders: Added new Test order of Carotid Duplex (Carotid Duplex) - Signed 

## 2010-07-23 NOTE — Progress Notes (Signed)
Summary: Vesicare Refill  Phone Note Refill Request Message from:  Fax from Pharmacy on February 11, 2010 2:00 PM  Refills Requested: Medication #1:  VESICARE 5 MG TABS one by mouth once daily   Dosage confirmed as above?Dosage Confirmed   Brand Name Necessary? No   Supply Requested: 1 month   Last Refilled: 01/19/2010  Method Requested: Electronic Next Appointment Scheduled: 02/12/2010 3:30 pm  Dr Artist Pais Initial call taken by: Glendell Docker CMA,  February 11, 2010 2:01 PM    Prescriptions: VESICARE 5 MG TABS (SOLIFENACIN SUCCINATE) one by mouth once daily  #30 x 0   Entered by:   Glendell Docker CMA   Authorized by:   D. Thomos Lemons DO   Signed by:   Glendell Docker CMA on 02/11/2010   Method used:   Electronically to        [id number] [address] [facility] (retail)       [address]       [place], [place]  [id number]       Ph: [phone]       Fax: [phone]   RxID:   [id number]

## 2010-07-23 NOTE — Medication Information (Signed)
Summary: ccr/jss  Anticoagulant Therapy  Managed by: Eda Keys, PharmD PCP: Dondra Spry DO Supervising MD: Daleen Squibb MD, Maisie Fus Indication 1: Deep Vein Thrombosis - Leg (ICD-451.1) Lab Used: [facility] [place] Site: [address] INR POC 1.0 INR RANGE 2 - 3  Dietary changes: no    Health status changes: no    Bleeding/hemorrhagic complications: no    Recent/future hospitalizations: no    Any changes in medication regimen? no    Recent/future dental: no  Any missed doses?: no       Is patient compliant with meds? yes       Allergies: 1)  ! Pcn 2)  Morphine Sulfate (Morphine Sulfate) 3)  Penicillin G Potassium (Penicillin G Potassium) 4)  Sertraline Hcl (Sertraline Hcl)  Anticoagulation Management History:      The patient is taking warfarin and comes in today for a routine follow up visit.  Positive risk factors for bleeding include an age of 75 years or older.  The bleeding index is 'intermediate risk'.  Positive CHADS2 values include History of HTN and Age > 70 years old.  The start date was 05/28/2007.  Her last INR was 2.4.  Anticoagulation responsible provider: Daleen Squibb MD, Maisie Fus.  INR POC: 1.0.  Cuvette Lot#: [id number].  Exp: 11/2010.    Anticoagulation Management Assessment/Plan:      The patient's current anticoagulation dose is Coumadin 5 mg  tabs: Take as directed by coumadin clinic..  The target INR is 2.0-3.0.  The next INR is due 10/30/2009.  Anticoagulation instructions were given to patient.  Results were reviewed/authorized by Eda Keys, PharmD.  She was notified by Eda Keys.         Prior Anticoagulation Instructions: INR 1.7  Increase dose to 1 tablet every day except 1 1/2 tablets on Saturday   Current Anticoagulation Instructions: INR 1.0  Take 2 tablets today. Then start NEW dosing schedule of 1.5 tablets on Wednesday and Saturday.  Return to clinic in 1 week.

## 2010-07-23 NOTE — Progress Notes (Signed)
Summary: Xray results  Phone Note Outgoing Call   Summary of Call: call pt - cxr is normal.   Leave phone note open until bone scan results avail Initial call taken by: D. Thomos Lemons DO,  April 12, 2010 1:37 PM  Follow-up for Phone Call        call placed to patient at [phone], she has been advised per Dr Artist Pais instructions Follow-up by: Glendell Docker CMA,  April 15, 2010 10:26 AM  Additional Follow-up for Phone Call Additional follow up Details #1::        plz check status of bone scan Additional Follow-up by: D. Thomos Lemons DO,  April 17, 2010 12:03 PM    Additional Follow-up for Phone Call Additional follow up Details #2::    patient is scheduled for Bone Scan on 04/19/2010 @ 11am  Glendell Docker CMA  April 18, 2010 2:59 PM   Additional Follow-up for Phone Call Additional follow up Details #3:: Details for Additional Follow-up Action Taken: call pt - bone scan shows faint activity in lumbar spine.  this is probably due to arthritis but an MRI of lumbar scan can be performed to rule on bony metastasis please send copy to her oncologist Additional Follow-up by: D. Thomos Lemons DO,  April 22, 2010 12:42 PM   attempted to contact patient at [phone], line busy x 3 Glendell Docker [facility]  April 22, 2010 2:38 PM   call returned to patient at [phone], spoke with patients daughter Fulton Mole she states patient has decided that she will have the surgery. They have contacted [facility], and was informed that Dr Rayburn Ma was out of the office until next week. Someone from [facility] will be contacting them later this week to schedule the surgery. Patient states that she has seen Dr Arlice Colt and is shceduled to follow up on December 12 th. Patients daughter Fulton Mole was also informed the Bone scan results would be forwarded to Dr Arlice Colt for review and the decision on a MRI of the lumbar spine  would come from Dr Arlice Colt. Patients daughter Fulton Mole verbalized understanding and agrees.  She  also  was advised to discuss with Dr Arlice Colt about support groups for patient .  Glendell Docker CMA  April 23, 2010 1:56 PM

## 2010-07-23 NOTE — Assessment & Plan Note (Signed)
Summary: 1 WEEK FOLLOWUP/MHF   Vital Signs:  Patient profile:   77 year old female O2 Sat:      97 % on Room air Temp:     97.8 degrees F oral Pulse rhythm:   regular Resp:     18 per minute BP sitting:   136 / 80  (right arm) Cuff size:   regular  Vitals Entered By: Glendell Docker CMA (March 25, 2010 3:35 PM)  O2 Flow:  Room air CC: 1 Week Follow Is Patient Diabetic? No Pain Assessment Patient in pain? no        Primary Care Provider:  Dondra Spry DO  CC:  1 Week Follow.  History of Present Illness: 77 y/o AA female for f/u re:  venous stasis ulcer she reports leg fever better with una boot no fever or chills no drainage   htn - stable  Allergies: 1)  ! Pcn 2)  Morphine Sulfate (Morphine Sulfate) 3)  Penicillin G Potassium (Penicillin G Potassium) 4)  Sertraline Hcl (Sertraline Hcl)  Past History:  Past Medical History: Hypertension Hypothyroidism  Seizure disorder       Cerebrovascular accident, hx of   History of DVT - Right   Anticoagulation therapy   severe glaucoma    Depression / anxiety Overactive bladder  Past Surgical History: Hysterectomy Rt Hip Replacement      B/L laser eye surgery           Social History: Retired  Married      Never Smoked     Alcohol use-no             Physical Exam  General:  alert, well-developed, and well-nourished.   Lungs:  normal respiratory effort and normal breath sounds.   Heart:  normal rate, regular rhythm, and no gallop.   Extremities:  trace left pedal edema and trace right pedal edema.   Skin:  chronic venous stasis dermatitis right lower ext,,  no redness,  no drainage   Impression & Recommendations:  Problem # 1:  VENOUS STASIS ULCER (ICD-454.0) Assessment Improved improved with una boot. resume use of compression hose  Problem # 2:  HYPERTENSION (ICD-401.9) Assessment: Unchanged  Her updated medication list for this problem includes:    Nifedical Xl 30 Mg Tb24 (Nifedipine)  ..... One by mouth once daily    Diovan 80 Mg Tabs (Valsartan) ..... One by mouth once daily  BP today: 136/80 Prior BP: 122/70 (02/12/2010)  Prior 10 Yr Risk Heart Disease: Not enough information (12/05/2008)  Labs Reviewed: K+: 4.7 (02/12/2010) Creat: : 1.14 (02/12/2010)     Complete Medication List: 1)  Gabapentin 300 Mg Caps (Gabapentin) .... One by mouth in am and one tab at bedtime 2)  Synthroid 112 Mcg Tabs (Levothyroxine sodium) .... One by mouth once daily 3)  Cosopt 2-0.5 % Soln (Dorzolamide-timolol) .... One drop each eye two times a day 4)  Xalatan 0.005 % Soln (Latanoprost) .... One drop each eye at bedtime 5)  Nifedical Xl 30 Mg Tb24 (Nifedipine) .... One by mouth once daily 6)  Coumadin 5 Mg Tabs (Warfarin sodium) .... 5 mg  by mouth 5 days per week and 2.5 mg by mouth on wednesday and saturdays 7)  Piloptic-1 1 % Soln (Pilocarpine hcl) .... One drop each eye four times a day 8)  Caltrate 600+d Plus 600-400 Mg-unit Chew (Calcium carbonate-vit d-min) .... One by mouth bid 9)  Freestyle Lite Test Strp (Glucose blood) .... Use to  test blood sugar once daily (dx code 790.29) 10)  Vesicare 5 Mg Tabs (Solifenacin succinate) .... One by mouth once daily 11)  Alphagan P 0.15 % Soln (Brimonidine tartrate) .Marland Kitchen.. 1 drop left eye  every morning 12)  Diovan 80 Mg Tabs (Valsartan) .... One by mouth once daily  Patient Instructions: 1)  Please schedule a follow-up appointment in 3 months.  Current Allergies (reviewed today): ! PCN MORPHINE SULFATE (MORPHINE SULFATE) PENICILLIN G POTASSIUM (PENICILLIN G POTASSIUM) SERTRALINE HCL (SERTRALINE HCL)

## 2010-07-23 NOTE — Progress Notes (Signed)
Summary: Pt ready to see Dr Artist Pais  Phone Note Call from Patient Call back at Home Phone [phone] Call back at [phone]   Caller: Daughter Summary of Call: SW Fulton Mole, pt's daughter, pt said that Dr Artist Pais mentioned he could refer her to Dr Myna Hidalgo & she had refused at the OV but now she feels she is ready to see Dr Myna Hidalgo, she is requesting Dr Artist Pais to set up an afternoon appt Initial call taken by: Lannette Donath,  April 22, 2010 4:00 PM  Follow-up for Phone Call        plz make sure pt not already seen by cancer center near [facility] hospital Follow-up by: D. Thomos Lemons DO,  April 23, 2010 12:00 PM  Additional Follow-up for Phone Call Additional follow up Details #1::        patients daughter Fulton Mole called back and left a voice message stating the patient has decided that she will see Dr Rayburn Ma in [place] to have her surgery performed. She has asked that if appointments have been set up with Dr Drue Dun, she would like to cancell them. She apologizes for any confusion that may have been caused. Additional Follow-up by: Glendell Docker CMA,  April 23, 2010 1:16 PM

## 2010-07-23 NOTE — Letter (Signed)
Summary: [facility]  [facility]   Imported By: Lanelle Bal 04/01/2010 11:23:22  _____________________________________________________________________  External Attachment:    Type:   Image     Comment:   External Document

## 2010-07-23 NOTE — Letter (Signed)
Summary: Appt Scheduled/[facility]  Appt Scheduled/[facility]   Imported By: Lanelle Bal 04/01/2010 11:57:25  _____________________________________________________________________  External Attachment:    Type:   Image     Comment:   External Document

## 2010-07-23 NOTE — Progress Notes (Signed)
Summary: Medication Reaction  Phone Note Call from Patient Call back at Home Phone [phone]   Caller: Daughter- Lyman Bishop Summary of Call: patients daughter called and left voice message stating patient has devloped a rash after starting the Zoloft. She has stopped taking the medication, however the daughter feels the patients needs to be on something for depression. She would like to know if there is anything else the patient could try. Initial call taken by: Glendell Docker CMA,  July 26, 2009 1:12 PM  Follow-up for Phone Call        I suggest she try lexapro.  see rx Follow-up by: D. Thomos Lemons DO,  July 26, 2009 1:17 PM  Additional Follow-up for Phone Call Additional follow up Details #1::        attempted to contact patient at [phone], no answer, voice message reached, but not accepting messages Additional Follow-up by: Glendell Docker CMA,  July 26, 2009 3:33 PM   New Allergies: SERTRALINE HCL (SERTRALINE HCL) Additional Follow-up for Phone Call Additional follow up Details #2::    spoke with patients daughter Clelia Schaumann she was advised per Dr Artist Pais instructions.  Follow-up by: Glendell Docker CMA,  July 27, 2009 8:10 AM  New/Updated Medications: LEXAPRO 5 MG TABS (ESCITALOPRAM OXALATE) 1/2 by mouth once daily x 1 week, then one by mouth once daily New Allergies: SERTRALINE HCL (SERTRALINE HCL)Prescriptions: LEXAPRO 5 MG TABS (ESCITALOPRAM OXALATE) 1/2 by mouth once daily x 1 week, then one by mouth once daily  #30 x 1   Entered and Authorized by:   D. Thomos Lemons DO   Signed by:   D. Thomos Lemons DO on 07/26/2009   Method used:   Electronically to        [id number] [address] (retail)       [address]       [place], [place]  [id number]       Ph: [phone]       Fax: [phone]   RxID:   [id number]

## 2010-07-23 NOTE — Medication Information (Signed)
Summary: [facility]  Anticoagulant Therapy  Managed by: Cloyde Reams, RN, BSN PCP: Dondra Spry DO Supervising MD: Tenny Craw MD, Gunnar Fusi Indication 1: Deep Vein Thrombosis - Leg (ICD-451.1) Lab Used: [facility] [place] Site: [address] INR POC 3.3 INR RANGE 2 - 3  Dietary changes: yes       Details: Eating less vit K.    Health status changes: no    Bleeding/hemorrhagic complications: no    Recent/future hospitalizations: no    Any changes in medication regimen? yes       Details: OTC cold medication and abx since last visit.  Took Prednisone for skin rash. Stopped Enalapril, started on Diovan.  Recent/future dental: no  Any missed doses?: no       Is patient compliant with meds? yes       Allergies: 1)  ! Pcn 2)  Morphine Sulfate (Morphine Sulfate) 3)  Penicillin G Potassium (Penicillin G Potassium) 4)  Sertraline Hcl (Sertraline Hcl)  Anticoagulation Management History:      The patient is taking warfarin and comes in today for a routine follow up visit.  Positive risk factors for bleeding include an age of 75 years or older.  The bleeding index is 'intermediate risk'.  Positive CHADS2 values include History of HTN and Age > 64 years old.  The start date was 05/28/2007.  Her last INR was 2.4.  Anticoagulation responsible provider: Tenny Craw MD, Gunnar Fusi.  INR POC: 3.3.  Exp: 10/2010.    Anticoagulation Management Assessment/Plan:      The patient's current anticoagulation dose is Coumadin 5 mg  tabs: Take as directed by coumadin clinic..  The target INR is 2.0-3.0.  The next INR is due 10/05/2009.  Anticoagulation instructions were given to patient.  Results were reviewed/authorized by Cloyde Reams, RN, BSN.  She was notified by Cloyde Reams RN.         Prior Anticoagulation Instructions: INR 1.6  Take 1.5 tabs each Friday and 1 tab on all other days.    Current Anticoagulation Instructions: INR 3.3  Skip tomorrow's dosage then start taking 1 tablet daily.  Recheck  in 3 weeks.

## 2010-07-23 NOTE — Progress Notes (Signed)
Summary: Test Results  Phone Note Outgoing Call   Summary of Call: call pt - carotid dopplers are negative.  please send copy of carotid artery doppler to her eye doctor Initial call taken by: D. Thomos Lemons DO,  July 31, 2009 5:26 PM  Follow-up for Phone Call        patients daughter [place] Sink informed per Dr Artist Pais instructions. She states patients eye doctor is Dr Homero Fellers L. [place] Office [phone] Fax number [phone].   Doppler results faxed  Follow-up by: Glendell Docker CMA,  August 01, 2009 8:49 AM

## 2010-07-23 NOTE — Letter (Signed)
Summary: [facility]  [facility]   Imported By: Maryln Gottron 05/21/2010 14:38:42  _____________________________________________________________________  External Attachment:    Type:   Image     Comment:   External Document

## 2010-07-23 NOTE — Assessment & Plan Note (Signed)
Summary: DICUSS NEW DIAGNOSIS/DK   Vital Signs:  Patient profile:   77 year old female O2 Sat:      99 % on Room air Temp:     98.1 degrees F oral Pulse rate:   65 / minute Pulse rhythm:   irregular Resp:     22 per minute BP sitting:   142 / 72  (right arm) Cuff size:   large  Vitals Entered By: Glendell Docker CMA (April 11, 2010 3:37 PM)  O2 Flow:  Room air CC: discuss current diagnosis Is Patient Diabetic? No Pain Assessment Patient in pain? no      Comments c/ o left arm pain, thinks that it may be related to biopsy, discuss diagnosis and treatment options   Primary Care Provider:  Dondra Spry DO  CC:  discuss current diagnosis.  History of Present Illness: 77 y/o female for f/u pt recently seen by her GYN for routine f/u routine mammo showed:  she had needle biopsy    Suspicious mass in the 11 o'clock location of the left breast.    Biopsy is suggested. I discussed the findings with the the patient    and her daughter. Biopsy is performed on the same day and dictated    separately.    FINAL DIAGNOSIS    1. Breast, left, needle core biopsy, mass, 11 o''clock :   INVASIVE MAMMARY CARCINOMA.    DATE SIGNED OUT: 04/03/2010   ELECTRONIC SIGNATURE : Luisa Hart M.D., Jonny Ruiz, Pathologist, Electronic Signature    MICROSCOPIC DESCRIPTION   1. The features favor invasive ductal carcinoma, intermediate to high grade. A   breast prognostic profile will be performed and reported separately.   Dr. Frederica Kuster has reviewed this case and agrees.   The results were called to the [facility] on 04/03/10. (JDP:kv  Allergies: 1)  ! Pcn 2)  Morphine Sulfate (Morphine Sulfate) 3)  Penicillin G Potassium (Penicillin G Potassium) 4)  Sertraline Hcl (Sertraline Hcl)  Past History:  Past Medical History: Hypertension Hypothyroidism   Seizure disorder       Cerebrovascular accident, hx of   History of DVT - Right   Anticoagulation therapy   severe glaucoma     Depression / anxiety Overactive bladder  Past Surgical History: Hysterectomy Rt Hip Replacement      B/L laser eye surgery            Social History: Retired  Married      Never Smoked      Alcohol use-no             Review of Systems       daugther concerned about stress of new diagnosis,  pt refuses to go to support group  Physical Exam  General:  alert, well-developed, and well-nourished.   Head:  normocephalic and atraumatic.   Eyes:  pupils equal, pupils round, and pupils reactive to light.   Neck:  No deformities, masses, or tenderness noted.no carotid bruits.   Lungs:  normal respiratory effort and normal breath sounds.   Heart:  normal rate, regular rhythm, and no gallop.   Extremities:  trace left pedal edema and trace right pedal edema.   Psych:  flat affect and tearful.     Impression & Recommendations:  Problem # 1:  ADENOCARCINOMA, LEFT BREAST (ICD-174.9) Pt has appt next week at cancer center she reports intermittent jaw and arm pain.  check bone scan  Orders: CXR- 2view (CXR) Radiology Referral (Radiology)  Problem # 2:  HYPERTENSION (ICD-401.9) Assessment: Unchanged  Her updated medication list for this problem includes:    Nifedical Xl 30 Mg Tb24 (Nifedipine) ..... One by mouth once daily    Diovan 80 Mg Tabs (Valsartan) ..... One by mouth once daily  BP today: 142/72 Prior BP: 136/80 (03/25/2010)  Prior 10 Yr Risk Heart Disease: Not enough information (12/05/2008)  Labs Reviewed: K+: 4.7 (02/12/2010) Creat: : 1.14 (02/12/2010)     Problem # 3:  HYPOTHYROIDISM (ICD-244.9) Assessment: Unchanged  Her updated medication list for this problem includes:    Synthroid 112 Mcg Tabs (Levothyroxine sodium) ..... One by mouth once daily  Labs Reviewed: TSH: 3.543 (02/12/2010)    HgBA1c: 5.7 (08/06/2007)  Complete Medication List: 1)  Gabapentin 300 Mg Caps (Gabapentin) .... One by mouth in am and one tab at bedtime 2)  Synthroid 112 Mcg  Tabs (Levothyroxine sodium) .... One by mouth once daily 3)  Cosopt 2-0.5 % Soln (Dorzolamide-timolol) .... One drop each eye two times a day 4)  Xalatan 0.005 % Soln (Latanoprost) .... One drop each eye at bedtime 5)  Nifedical Xl 30 Mg Tb24 (Nifedipine) .... One by mouth once daily 6)  Coumadin 5 Mg Tabs (Warfarin sodium) .... 5 mg  by mouth 5 days per week and 2.5 mg by mouth on wednesday and saturdays 7)  Piloptic-1 1 % Soln (Pilocarpine hcl) .... One drop each eye four times a day 8)  Caltrate 600+d Plus 600-400 Mg-unit Chew (Calcium carbonate-vit d-min) .... One by mouth bid 9)  Freestyle Lite Test Strp (Glucose blood) .... Use to test blood sugar once daily (dx code 790.29) 10)  Vesicare 5 Mg Tabs (Solifenacin succinate) .... One by mouth once daily 11)  Alphagan P 0.15 % Soln (Brimonidine tartrate) .Marland Kitchen.. 1 drop left eye  every morning 12)  Diovan 80 Mg Tabs (Valsartan) .... One by mouth once daily  Other Orders: EKG w/ Interpretation (93000)  Patient Instructions: 1)  Please schedule a follow-up appointment in 2 months.   Orders Added: 1)  CXR- 2view [CXR] 2)  Est. Patient Level III [[address]] 3)  EKG w/ Interpretation [93000] 4)  Radiology Referral [Radiology] 5)  Est. Patient Level IV [60454]    Current Allergies (reviewed today): ! PCN MORPHINE SULFATE (MORPHINE SULFATE) PENICILLIN G POTASSIUM (PENICILLIN G POTASSIUM) SERTRALINE HCL (SERTRALINE HCL)

## 2010-07-23 NOTE — Progress Notes (Signed)
Summary: Lab Results  Phone Note Outgoing Call   Summary of Call: call pt - blood test shows pt should be taking higher dose of thyroid medication.  see rx.  Arrange repeat TSH in 2 months Initial call taken by: D. Thomos Lemons DO,  February 14, 2010 3:05 PM  Follow-up for Phone Call        attempted to contact patient at [phone], no answer, no voice message Glendell Docker [facility]  February 14, 2010 4:29 PM   Additional Follow-up for Phone Call Additional follow up Details #1::        call placed to patient. She was advised per Dr Artist Pais instructions. She was informed follow up blood work was non fasting and is due in October. Patient verbalized understanding, and agrees as instructed Additional Follow-up by: Glendell Docker CMA,  February 15, 2010 10:46 AM    New/Updated Medications: SYNTHROID 112 MCG TABS (LEVOTHYROXINE SODIUM) one by mouth once daily Prescriptions: SYNTHROID 112 MCG TABS (LEVOTHYROXINE SODIUM) one by mouth once daily  #30 x 3   Entered and Authorized by:   D. Thomos Lemons DO   Signed by:   D. Thomos Lemons DO on 02/14/2010   Method used:   Electronically to        [facility] [address] (retail)       [address]       [place], [place]  [id number]       Ph: [phone]       Fax: [phone]   RxID:   [id number]

## 2010-07-23 NOTE — Medication Information (Signed)
Summary: rov/mlw  Anticoagulant Therapy  Managed by: Weston Brass, PharmD PCP: Dondra Spry DO Supervising MD: Tenny Craw MD, Gunnar Fusi Indication 1: Deep Vein Thrombosis - Leg (ICD-451.1) Lab Used: [facility] [place] Site: [address] INR POC 3.2 INR RANGE 2 - 3  Dietary changes: yes       Details: did not eat usual greens over the weekend  Health status changes: no    Bleeding/hemorrhagic complications: no    Recent/future hospitalizations: no    Any changes in medication regimen? no    Recent/future dental: no  Any missed doses?: no       Is patient compliant with meds? yes       Allergies: 1)  ! Pcn 2)  Morphine Sulfate (Morphine Sulfate) 3)  Penicillin G Potassium (Penicillin G Potassium) 4)  Sertraline Hcl (Sertraline Hcl)  Anticoagulation Management History:      The patient is taking warfarin and comes in today for a routine follow up visit.  Positive risk factors for bleeding include an age of 75 years or older.  The bleeding index is 'intermediate risk'.  Positive CHADS2 values include History of HTN and Age > 30 years old.  The start date was 05/28/2007.  Her last INR was 2.4.  Anticoagulation responsible provider: Tenny Craw MD, Gunnar Fusi.  INR POC: 3.2.  Cuvette Lot#: [id number].  Exp: 02/2011.    Anticoagulation Management Assessment/Plan:      The patient's current anticoagulation dose is Coumadin 5 mg  tabs: Take as directed by coumadin clinic..  The target INR is 2.0-3.0.  The next INR is due 01/21/2010.  Anticoagulation instructions were given to patient.  Results were reviewed/authorized by Weston Brass, PharmD.  She was notified by Weston Brass PharmD.         Prior Anticoagulation Instructions: INR 2.8 The patient is to continue with the same dose of coumadin.  This dosage includes: one tab daily, except 1.5 tablets on Wednesdays and Fridays.  Next INR on Monday, July 11 at  2:30 pm.   Current Anticoagulation Instructions: INR 3.2  Skip tomorrow's dose of  Coumadin then resume same dose of 1 tablet every day except 1 1/2 tablets on Wednesday and Saturday.

## 2010-07-23 NOTE — Medication Information (Signed)
Summary: Coumadin Clinic   Anticoagulant Therapy  Managed by: Weston Brass, PharmD PCP: Dondra Spry DO Supervising MD: Excell Seltzer MD, Casimiro Needle Indication 1: Deep Vein Thrombosis - Leg (ICD-451.1) Lab Used: [facility] [place] Site: [address] PT 26.0 INR POC 2.37 INR RANGE 2 - 3  Dietary changes: no    Health status changes: no    Bleeding/hemorrhagic complications: no    Recent/future hospitalizations: no    Any changes in medication regimen? no    Recent/future dental: no  Any missed doses?: no       Is patient compliant with meds? yes      Comments: Labs drawn by Dr. Artist Pais on 9/26.   Spoke with pt on 9/27.  Allergies: 1)  ! Pcn 2)  Morphine Sulfate (Morphine Sulfate) 3)  Penicillin G Potassium (Penicillin G Potassium) 4)  Sertraline Hcl (Sertraline Hcl)  Anticoagulation Management History:      Her anticoagulation is being managed by telephone today.  Positive risk factors for bleeding include an age of 75 years or older.  The bleeding index is 'intermediate risk'.  Positive CHADS2 values include History of HTN and Age > 20 years old.  The start date was 05/28/2007.  Her last INR was 2.4.  Prothrombin time is 26.0.  Anticoagulation responsible provider: Excell Seltzer MD, Casimiro Needle.  INR POC: 2.37.  Exp: 03/2011.    Anticoagulation Management Assessment/Plan:      The patient's current anticoagulation dose is Coumadin 5 mg  tabs: 5 mg  by mouth 5 days per week and 2.5 mg by mouth on Wednesday and Saturdays.  The target INR is 2.0-3.0.  The next INR is due 04/16/2010.  Anticoagulation instructions were given to patient.  Results were reviewed/authorized by Weston Brass, PharmD.  She was notified by Weston Brass PharmD.         Prior Anticoagulation Instructions: INR 2.6  Continue taking 1 tablet (5mg ) every day except take 1.5 (7.5mg ) on Wednesdays and Saturdays.  Recheck in 4 weeks.   Current Anticoagulation Instructions: INR 2.37  Spoke with pt.  Continue same dose  of 1 tablet every day except 1 1/2 tablets on Wednesday and Saturday.   Recheck INR in 4 weeks.

## 2010-07-23 NOTE — Consult Note (Signed)
Summary: [facility]  [facility]   Imported By: Lanelle Bal 10/11/2009 09:33:04  _____________________________________________________________________  External Attachment:    Type:   Image     Comment:   External Document

## 2010-07-23 NOTE — Medication Information (Signed)
Summary: rov/kh   Anticoagulant Therapy  Managed by: Weston Brass, PharmD PCP: Dondra Spry DO Supervising MD: Riley Kill MD, Maisie Fus Indication 1: Deep Vein Thrombosis - Leg (ICD-451.1) Lab Used: [facility] [place] Site: [address] INR POC 2.6 INR RANGE 2 - 3  Dietary changes: no    Health status changes: yes       Details: Pt experiences "floating" sensations that last for up to 20 minutes- has talked to her PCP about it  Bleeding/hemorrhagic complications: no    Recent/future hospitalizations: no    Any changes in medication regimen? no    Recent/future dental: no  Any missed doses?: no       Is patient compliant with meds? yes       Allergies: 1)  ! Pcn 2)  Morphine Sulfate (Morphine Sulfate) 3)  Penicillin G Potassium (Penicillin G Potassium) 4)  Sertraline Hcl (Sertraline Hcl)  Anticoagulation Management History:      The patient is taking warfarin and comes in today for a routine follow up visit.  Positive risk factors for bleeding include an age of 75 years or older.  The bleeding index is 'intermediate risk'.  Positive CHADS2 values include History of HTN and Age > 76 years old.  The start date was 05/28/2007.  Her last INR was 2.4.  Anticoagulation responsible provider: Riley Kill MD, Maisie Fus.  INR POC: 2.6.  Cuvette Lot#: [id number].  Exp: 03/2011.    Anticoagulation Management Assessment/Plan:      The patient's current anticoagulation dose is Coumadin 5 mg  tabs: Take as directed by coumadin clinic..  The target INR is 2.0-3.0.  The next INR is due 03/18/2010.  Anticoagulation instructions were given to patient.  Results were reviewed/authorized by Weston Brass, PharmD.  She was notified by Gweneth Fritter, PharmD Candidate.         Prior Anticoagulation Instructions: INR 2.5  Continue 1 tablet everyday except 1 and 1/2 tablets on Wednesday and Saturday. Recheck in 4 weeks.   Current Anticoagulation Instructions: INR 2.6  Continue taking 1 tablet (5mg )  every day except take 1.5 (7.5mg ) on Wednesdays and Saturdays.  Recheck in 4 weeks.

## 2010-07-23 NOTE — Medication Information (Signed)
Summary: rov/tm  Anticoagulant Therapy  Managed by: Shelby Dubin, PharmD, BCPS, CPP PCP: Dondra Spry DO Supervising MD: Jens Som MD, Arlys John Indication 1: Deep Vein Thrombosis - Leg (ICD-451.1) Lab Used: [facility] [place] Site: [address] INR POC 1.6 INR RANGE 2 - 3  Dietary changes: no    Health status changes: no    Bleeding/hemorrhagic complications: no    Recent/future hospitalizations: no    Any changes in medication regimen? yes       Details: azithromycin and prednisone  Recent/future dental: no  Any missed doses?: no       Is patient compliant with meds? yes       Allergies (verified): 1)  ! Pcn 2)  Morphine Sulfate (Morphine Sulfate) 3)  Penicillin G Potassium (Penicillin G Potassium) 4)  Sertraline Hcl (Sertraline Hcl)  Anticoagulation Management History:      The patient is taking warfarin and comes in today for a routine follow up visit.  Positive risk factors for bleeding include an age of 75 years or older.  The bleeding index is 'intermediate risk'.  Positive CHADS2 values include History of HTN and Age > 90 years old.  The start date was 05/28/2007.  Her last INR was 2.4.  Anticoagulation responsible provider: Jens Som MD, Arlys John.  INR POC: 1.6.  Cuvette Lot#: [id number].  Exp: 10/2010.    Anticoagulation Management Assessment/Plan:      The patient's current anticoagulation dose is Coumadin 5 mg  tabs: Take as directed by coumadin clinic..  The target INR is 2.0-3.0.  The next INR is due 09/13/2009.  Anticoagulation instructions were given to patient.  Results were reviewed/authorized by Shelby Dubin, PharmD, BCPS, CPP.  She was notified by Shelby Dubin PharmD, BCPS, CPP.         Prior Anticoagulation Instructions: INR 3.1 On Friday take 1/2 tablet then change dose to 1 tablet everyday. Recheck in 2 weeks.   Current Anticoagulation Instructions: INR 1.6  Take 1.5 tabs each Friday and 1 tab on all other days.

## 2010-07-23 NOTE — Assessment & Plan Note (Signed)
Summary: 3 MONTH FOLLOW UP/MHF, resched- jr   Vital Signs:  Patient profile:   77 year old female Weight:      152 pounds O2 Sat:      95 % on Room air Temp:     97.8 degrees F oral Pulse rate:   57 / minute Pulse rhythm:   regular Resp:     16 per minute BP sitting:   90 / 60  (left arm) Cuff size:   large  Vitals Entered By: Glendell Docker CMA (July 06, 2009 3:59 PM)  O2 Flow:  Room air  Primary Care Provider:  D. Thomos Lemons DO  CC:  3 Month Follow up.  History of Present Illness: 3 Month Follow up  77 y/o white female for follow up.   Pt still having ongoing visual changes.  reviewed note from eye doctor.  He is concerned pt having ischemic attacks.  She has hx of lower ext pain and thrombophlebitis.  She denies hx of temporal pain. no jaw claudication.   chronic hip pain but not shoulder or upper arm pain.  husband notes she was on prednisone in the remote past after her hemorrhagic stroke.  pt's husband not sure exactly why prednisone was prev used.  Daughter notes pt has chronic cough after food intake   Allergies: 1)  ! Pcn 2)  Morphine Sulfate (Morphine Sulfate) 3)  Penicillin G Potassium (Penicillin G Potassium)  Past History:  Past Medical History: Hypertension Hypothyroidism Seizure disorder     Cerebrovascular accident, hx of   History of DVT - Right  Anticoagulation therapy   Glaucoma    Depression / anxiety  Past Surgical History: Hysterectomy Rt Hip Replacement    B/L laser eye surgery        Social History: Retired  Married    Never Smoked  Alcohol use-no           Review of Systems       chronic right lower leg pain,  depth perception problems,  no headache  Physical Exam  General:  alert, well-developed, and well-nourished.   Head:  normocephalic and atraumatic.   Eyes:  pupils equal, pupils round, and pupils reactive to light.   Lungs:  normal respiratory effort and normal breath sounds.   Heart:  normal rate, regular rhythm,  and no gallop.   Msk:  right lower ext - chronic venous stasis changes.  various areas on shin tender Extremities:  1+ left pedal edema and trace right pedal edema.   Psych:  normally interactive, good eye contact, not anxious appearing, and not depressed appearing.     Impression & Recommendations:  Problem # 1:  RETINAL ISCHEMIA (ICD-362.84) Her ophthalmologist concerned about retinal ischemia.  I doubt carotid source.   I am concerned about possibility of vasculitis.  Her ESR is only mildly elevated.   We discussed trial of prednisone 20 mg two times a day x 2 weeks.  If vision improves, continue prednisone at lower dose.  glucometer provided so she can monitor for hyperglycemia   Problem # 2:  COUGH (ICD-786.2) Daughter has noticed pt coughs after meals.  no heartburn symptoms.   no increase post nasal gtt with meals.  rule out aspiration.  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  continue compression stockings.   prednisone may help  Problem # 4:  VITAMIN D DEFICIENCY (ICD-268.9) use  vit D3 2000 units OTC  Problem # 5:  HYPOTHYROIDISM (ICD-244.9) increase synthroid dose to 100 mcg Her updated medication list for this problem includes:    Synthroid 100 Mcg Tabs (Levothyroxine sodium) ..... One by mouth qd  Labs Reviewed: TSH: 5.76 (07/02/2009)    HgBA1c: 5.7 (08/06/2007)  Complete Medication List: 1)  Gabapentin 300 Mg Caps (Gabapentin) .... One by mouth in am and two tabs at bedtime 2)  Synthroid 100 Mcg Tabs (Levothyroxine sodium) .... One by mouth qd 3)  Cosopt 2-0.5 % Soln (Dorzolamide-timolol) .... One drop each eye two times a day 4)  Xalatan 0.005 % Soln (Latanoprost) .... One drop each eye at bedtime 5)  Nifedical Xl 30 Mg Tb24 (Nifedipine) .... One by mouth once daily 6)  Coumadin 5 Mg Tabs (Warfarin sodium) .... Take as directed by  coumadin clinic. 7)  Piloptic-1 1 % Soln (Pilocarpine hcl) .... One drop each eye four times a day 8)  Enalapril Maleate 5 Mg Tabs (Enalapril maleate) .... One by mouth qd 9)  Caltrate 600+d Plus 600-400 Mg-unit Chew (Calcium carbonate-vit d-min) .... One by mouth bid 10)  Citalopram Hydrobromide 20 Mg Tabs (Citalopram hydrobromide) .Marland Kitchen.. 1 by mouth once daily 11)  Loratadine 10 Mg Tabs (Loratadine) .... One by mouth qd 12)  Prednisone 20 Mg Tabs (Prednisone) .... One by mouth bid 13)  Vitamin D 2000 Unit Caps (Cholecalciferol) .... One by mouth once daily  Patient Instructions: 1)  Please schedule a follow-up appointment in 2 weeks. 2)  Check your blood sugar once daily.  3)  Call if your fasting AM blood sugar is > 200 Prescriptions: GABAPENTIN 300 MG CAPS (GABAPENTIN) one by mouth in AM and two tabs at bedtime  #90 x 5   Entered and Authorized by:   D. Thomos Lemons DO   Signed by:   D. Thomos Lemons DO on 07/06/2009   Method used:   Electronically to        [facility] [address] (retail)       [address]       [place], [place]  [id number]       Ph: [phone]       Fax: [phone]   RxID:   [id number] SYNTHROID 100 MCG TABS (LEVOTHYROXINE SODIUM) one by mouth qd  #30 x 2   Entered and Authorized by:   D. Thomos Lemons DO   Signed by:   D. Thomos Lemons DO on 07/06/2009   Method used:   Electronically to        [facility] [address] (retail)       [address]       [place], [place]  [id number]       Ph: [phone]       Fax: [phone]   RxID:   [id number] PREDNISONE 20 MG TABS (PREDNISONE) one by mouth bid  #30 x 0   Entered and Authorized by:   D. Thomos Lemons DO   Signed by:   D. Thomos Lemons DO on 07/06/2009   Method used:   Electronically to        [facility] [address] (retail)       [address]       [place], [place]  [id number]       Ph: [phone]       Fax: [phone]   RxID:   [id number]   Current  Allergies (reviewed today): ! PCN MORPHINE SULFATE (  MORPHINE SULFATE) PENICILLIN G POTASSIUM (PENICILLIN G POTASSIUM)

## 2010-07-23 NOTE — Medication Information (Signed)
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 no    Recent/future dental: no  Any missed doses?: no       Is patient compliant with meds? yes       Allergies: 1)  ! Pcn 2)  Morphine Sulfate (Morphine Sulfate) 3)  Penicillin G Potassium (Penicillin G Potassium) 4)  Sertraline Hcl (Sertraline Hcl)  Anticoagulation Management History:      The patient is taking warfarin and comes in today for a routine follow up visit.  Positive risk factors for bleeding include an age of 75 years or older.  The bleeding index is 'intermediate risk'.  Positive CHADS2 values include History of HTN and Age > 22 years old.  The start date was 05/28/2007.  Her last INR was 2.4.  Anticoagulation responsible provider: Bensimhon MD, Reuel Boom.  INR POC: 2.5.  Cuvette Lot#: [id number].  Exp: 01/2011.    Anticoagulation Management Assessment/Plan:      The patient's current anticoagulation dose is Coumadin 5 mg  tabs: Take as directed by coumadin clinic..  The target INR is 2.0-3.0.  The next INR is due 11/27/2009.  Anticoagulation instructions were given to patient.  Results were reviewed/authorized by Bethena Midget, RN, BSN.  She was notified by Bethena Midget, RN, BSN.         Prior Anticoagulation Instructions: INR 1.9  Take 1.5 tablets today.  Then return to normal dosing schedule of 1.5 tablets on Wednesday and Saturday, and 1 tablet all other days.  Return to clinic in 2 weeks.   Current Anticoagulation Instructions: INR 2.5 Continue 1 pill everyday except 1.5 pills on Wednesdays and Saturdays. Recheck in  2 weeks.

## 2010-07-23 NOTE — Assessment & Plan Note (Signed)
Summary: 2 month follow up/mhf   Vital Signs:  Patient profile:   77 year old female Height:      60 inches Weight:      150 pounds O2 Sat:      94 % on Room air Temp:     97.7 degrees F oral Pulse rate:   58 / minute Pulse rhythm:   regular Resp:     16 per minute BP sitting:   122 / 60  (right arm) Cuff size:   large  Vitals Entered By: Glendell Docker CMA (September 17, 2009 2:40 PM)  O2 Flow:  Room air CC: Rm 2- 2 Month Follow up    Primary Care Provider:  Dondra Spry DO  CC:  Rm 2- 2 Month Follow up .  History of Present Illness:  77 y/o female for follow up pt having ongoing back pain.  she is considering repeat back injections which helped in the past Dr. [name] would like to hold coumadin x 5 days.  overactive bladder - she notes sancutura is cost prohibitive.  she has chronic urgency  Allergies: 1)  ! Pcn 2)  Morphine Sulfate (Morphine Sulfate) 3)  Penicillin G Potassium (Penicillin G Potassium) 4)  Sertraline Hcl (Sertraline Hcl)  Past History:  Past Medical History: Hypertension Hypothyroidism  Seizure disorder       Cerebrovascular accident, hx of   History of DVT - Right   Anticoagulation therapy   severe glaucoma    Depression / anxiety  Past Surgical History: Hysterectomy Rt Hip Replacement      B/L laser eye surgery          Social History: Retired  Married     Never Smoked    Alcohol use-no             Physical Exam  General:  alert, well-developed, and well-nourished.   Lungs:  normal respiratory effort and normal breath sounds.   Heart:  normal rate, regular rhythm, and no gallop.   Extremities:  trace left pedal edema and trace right pedal edema.     Impression & Recommendations:  Problem # 1:  BACK PAIN, LUMBAR, CHRONIC (ICD-724.2) Dr. [name] planning epidural injection.  Hold coumadin x 5 days.  note forwarded to coumadin clinic.  Problem # 2:  OVERACTIVE BLADDER (ICD-596.51) Philis Nettle is cost prohibitive.   Pt  advised not to use OAB meds until cleared by opthalomologist.  Complete Medication List: 1)  Gabapentin 300 Mg Caps (Gabapentin) .... One by mouth in am and one tab at bedtime 2)  Synthroid 100 Mcg Tabs (Levothyroxine sodium) .... One by mouth qd 3)  Cosopt 2-0.5 % Soln (Dorzolamide-timolol) .... One drop each eye two times a day 4)  Xalatan 0.005 % Soln (Latanoprost) .... One drop each eye at bedtime 5)  Nifedical Xl 30 Mg Tb24 (Nifedipine) .... One by mouth once daily 6)  Coumadin 5 Mg Tabs (Warfarin sodium) .... Take as directed by coumadin clinic. 7)  Piloptic-1 1 % Soln (Pilocarpine hcl) .... One drop each eye four times a day 8)  Caltrate 600+d Plus 600-400 Mg-unit Chew (Calcium carbonate-vit d-min) .... One by mouth bid 9)  Vitamin D 2000 Unit Caps (Cholecalciferol) .... One by mouth once daily 10)  Freestyle Lite Test Strp (Glucose blood) .... Use to test blood sugar once daily (dx code 790.29) 11)  Freestyle Lancets Misc (Lancets) .... Use to test blood sugar once daily (dx code 790.29) 12)  Vesicare 5 Mg Tabs (  Solifenacin succinate) .... One by mouth once daily 13)  Alphagan P 0.15 % Soln (Brimonidine tartrate) .Marland Kitchen.. 1 drop left eye  every morning 14)  Hydrocodone-homatropine 5-1.5 Mg/23ml Syrp (Hydrocodone-homatropine) .... 5 ml by mouth qpm prn 15)  Diovan 80 Mg Tabs (Valsartan) .... One by mouth once daily  Patient Instructions: 1)  Keep your next follow up appointment date 2)  Call your eye doctor re:  use of Vesicare (overactive bladder medication)  before starting. Prescriptions: VESICARE 5 MG TABS (SOLIFENACIN SUCCINATE) one by mouth once daily  #30 x 3   Entered and Authorized by:   D. Thomos Lemons DO   Signed by:   D. Thomos Lemons DO on 09/17/2009   Method used:   Electronically to        [facility] [address] (retail)       [address]       [place], [place]  [id number]       Ph: [phone]       Fax: [phone]   RxID:   [id number]    Orders  Added: 1)  Est. Patient Level III [35573]   Current Allergies (reviewed today): ! PCN MORPHINE SULFATE (MORPHINE SULFATE) PENICILLIN G POTASSIUM (PENICILLIN G POTASSIUM) SERTRALINE HCL (SERTRALINE HCL)

## 2010-07-23 NOTE — Assessment & Plan Note (Signed)
Summary: 2:45 appt blisters on right leg/dt   Vital Signs:  Patient profile:   77 year old female Weight:      153.75 pounds BMI:     30.14 O2 Sat:      99 % on Room air Temp:     97.9 degrees F oral Pulse rate:   61 / minute Pulse rhythm:   irregular Resp:     20 per minute BP sitting:   134 / 74  (right arm) Cuff size:   regular  Vitals Entered By: Glendell Docker CMA (March 18, 2010 3:05 PM)  O2 Flow:  Room air  Contraindications/Deferment of Procedures/Staging:    Test/Procedure: Colonoscopy    Reason for deferment: patient declined     Test/Procedure: Mammogram    Reason for deferment: patient declined     Test/Procedure: PAP Smear    Reason for deferment: patient declined     Test/Procedure: FLU VAX    Reason for deferment: patient declined  CC: c/o right leg pain Pain Assessment Patient in pain? yes     Location: Right leg & foot Intensity: 10 Type: aching Onset of pain  With activity Comments c/o right leg pain shooting to toe, with foot pain, since last office visit pain has worsened, evaluation of chin, area that appear discolored and  at time she has discomfort   Primary Care Provider:  Dondra Spry DO  CC:  c/o right leg pain.  History of Present Illness: 77 y/o female with hx of right leg DVT, and chronic venous stasis for f/u pt c/o blister that formed on Saturday on right LE she also c/o right leg pain no fever or chills  daughter concerned mom having memory problems    Allergies: 1)  ! Pcn 2)  Morphine Sulfate (Morphine Sulfate) 3)  Penicillin G Potassium (Penicillin G Potassium) 4)  Sertraline Hcl (Sertraline Hcl)  Past History:  Past Medical History: Hypertension Hypothyroidism  Seizure disorder        Cerebrovascular accident, hx of   History of DVT - Right   Anticoagulation therapy   severe glaucoma    Depression / anxiety Overactive bladder  Social History: Retired  Married       Never Smoked    Alcohol use-no              Physical Exam  General:  alert, well-developed, and well-nourished.   Lungs:  normal respiratory effort and normal breath sounds.   Heart:  normal rate, regular rhythm, and no gallop.   Neurologic:  disoriented to year and month   Impression & Recommendations:  Problem # 1:  LEG PAIN, RIGHT (ICD-729.5)  pt with chronic low back pain.  hx of spinal stenosis.  tried ESIs in the past. not surgical candidate. refer to PT to see if TENs unit may be beneficial  Orders: Physical Therapy Referral (PT)  Problem # 2:  MEMORY LOSS (ICD-780.93)  Orders: Neurology Referral (Neuro)  Problem # 3:  COUMADIN THERAPY (ICD-V58.61)  Orders: T-Protime, Auto ([id number])  Problem # 4:  VENOUS STASIS ULCER (ICD-454.0) treat with una boot  Complete Medication List: 1)  Gabapentin 300 Mg Caps (Gabapentin) .... One by mouth in am and one tab at bedtime 2)  Synthroid 112 Mcg Tabs (Levothyroxine sodium) .... One by mouth once daily 3)  Cosopt 2-0.5 % Soln (Dorzolamide-timolol) .... One drop each eye two times a day 4)  Xalatan 0.005 % Soln (Latanoprost) .... One drop each eye at bedtime 5)  Nifedical Xl 30 Mg Tb24 (Nifedipine) .... One by mouth once daily 6)  Coumadin 5 Mg Tabs (Warfarin sodium) .... 5 mg  by mouth 5 days per week and 2.5 mg by mouth on wednesday and saturdays 7)  Piloptic-1 1 % Soln (Pilocarpine hcl) .... One drop each eye four times a day 8)  Caltrate 600+d Plus 600-400 Mg-unit Chew (Calcium carbonate-vit d-min) .... One by mouth bid 9)  Freestyle Lite Test Strp (Glucose blood) .... Use to test blood sugar once daily (dx code 790.29) 10)  Vesicare 5 Mg Tabs (Solifenacin succinate) .... One by mouth once daily 11)  Alphagan P 0.15 % Soln (Brimonidine tartrate) .Marland Kitchen.. 1 drop left eye  every morning 12)  Diovan 80 Mg Tabs (Valsartan) .... One by mouth once daily  Patient Instructions: 1)  Please schedule a follow-up appointment in 1 week.   Immunization  History:  Influenza Immunization History:    Influenza:  declined (03/18/2010)    Preventive Care Screening  Colonoscopy:    Date:  03/18/2010    Results:  Declined  Mammogram:    Date:  03/18/2010    Results:  Declined  Last Flu Shot:    Date:  03/18/2010    Results:  Declined  Pap Smear:    Date:  03/11/2010    Results:  Declined      Current Allergies (reviewed today): ! PCN MORPHINE SULFATE (MORPHINE SULFATE) PENICILLIN G POTASSIUM (PENICILLIN G POTASSIUM) SERTRALINE HCL (SERTRALINE HCL)

## 2010-07-23 NOTE — Progress Notes (Signed)
Summary: drug reaction  Phone Note Call from Patient   Caller: Daughter Summary of Call: Daughter called & stated that the Lexapro is making her mother itch. What should she do now? call back daughterCarolina Brennan at [phone] Initial call taken by: Michaelle Copas,  August 06, 2009 1:45 PM  Follow-up for Phone Call        how severe is itch and does pt have rash? Follow-up by: D. Thomos Lemons DO,  August 06, 2009 5:20 PM  Additional Follow-up for Phone Call Additional follow up Details #1::        call placed to patient,she states she is itching  all over  her body, on her arms, leg and stomach , and on her back. She denies the presence of a rash. She was advised to stop taking  the  Lexapro, and follow up with a office visit in one week. Patient and daughter Jennifer Brennan advised per Dr Artist Pais instructions, and verbalized understanding and agrees Additional Follow-up by: Glendell Docker CMA,  August 06, 2009 5:27 PM

## 2010-07-23 NOTE — Progress Notes (Signed)
Summary: Test Strips & Lancets  Phone Note Call from Patient Call back at Home Phone [phone]   Caller: Daughter-[name]  Summary of Call: patients daughter Fulton Mole called and left voice message wanting to know if they are to continue monitoring patient blood sugar. Her message states that her current readings are  215/ 162/ 137/ 152/ 133. She also wanted to make Dr Artist Pais aware that she seems to think the Prednisone is not working.   Call returned to patient she was informed to continue to monitor her blood sugars until she returns for appointment with Dr Artist Pais. She states she will need a refill on test strips and lancets. Patient/daughter both were informed rx would be sent to pharmacy. Patient did not have any concerns regarding the Prednisone   Initial call taken by: Glendell Docker CMA,  July 13, 2009 5:26 PM    New/Updated Medications: FREESTYLE LITE TEST  STRP (GLUCOSE BLOOD) use to test blood sugar once daily (DX Code 790.29) FREESTYLE LANCETS  MISC (LANCETS) use to test blood sugar once daily (DX COde 790.29) Prescriptions: FREESTYLE LANCETS  MISC (LANCETS) use to test blood sugar once daily (DX COde 790.29)  #30 x 2   Entered by:   Glendell Docker CMA   Authorized by:   D. Thomos Lemons DO   Signed by:   Glendell Docker CMA on 07/13/2009   Method used:   Electronically to        [facility] [address] (retail)       [address]       [place], [place]  [id number]       Ph: [phone]       Fax: [phone]   RxID:   [id number] FREESTYLE LITE TEST  STRP (GLUCOSE BLOOD) use to test blood sugar once daily (DX Code 790.29)  #30 x 2   Entered by:   Glendell Docker CMA   Authorized by:   D. Thomos Lemons DO   Signed by:   Glendell Docker CMA on 07/13/2009   Method used:   Electronically to        [facility] (retail)       [address]       [place], [place]  [id number]       Ph: [phone]       Fax: [phone]   RxID:    [id number]

## 2010-07-23 NOTE — Progress Notes (Signed)
Summary: Testing  Phone Note Call from Patient Call back at Home Phone [phone]   Caller: Daughter-[name] Summary of Call: patients daughter Fulton Mole called and left voice message stating patient is scheduled for a test at 11am and she does not remember where she is suppose to go for testing. Fulton Mole states her mother does not want to have the testing done, and she would like it cancelled Initial call taken by: Glendell Docker CMA,  July 17, 2009 2:41 PM  Follow-up for Phone Call        it was for swallowing eval Follow-up by: D. Thomos Lemons DO,  July 17, 2009 3:09 PM  Additional Follow-up for Phone Call Additional follow up Details #1::        attempted to contact [facility] [phone], spoke with Nash Dimmer in Radiology she state she will cancel appointment for patient.  Patient advised that appoiintment has been cancelled. Additional Follow-up by: Glendell Docker CMA,  July 17, 2009 4:59 PM

## 2010-07-23 NOTE — Medication Information (Signed)
Summary: rov/sp   Anticoagulant Therapy  Managed by: Tammy Sours PharmD PCP: Dondra Spry DO Supervising MD: Eden Emms MD, Theron Arista Indication 1: Deep Vein Thrombosis - Leg (ICD-451.1) Lab Used: [facility] [place] Site: [address] INR POC 2.5 INR RANGE 2 - 3  Dietary changes: no    Health status changes: no    Bleeding/hemorrhagic complications: no    Recent/future hospitalizations: no    Any changes in medication regimen? no    Recent/future dental: no  Any missed doses?: no       Is patient compliant with meds? yes       Allergies: 1)  ! Pcn 2)  Morphine Sulfate (Morphine Sulfate) 3)  Penicillin G Potassium (Penicillin G Potassium) 4)  Sertraline Hcl (Sertraline Hcl)  Anticoagulation Management History:      The patient is taking warfarin and comes in today for a routine follow up visit.  Positive risk factors for bleeding include an age of 75 years or older.  The bleeding index is 'intermediate risk'.  Positive CHADS2 values include History of HTN and Age > 47 years old.  The start date was 05/28/2007.  Her last INR was 2.4.  Anticoagulation responsible provider: Eden Emms MD, Theron Arista.  INR POC: 2.5.  Cuvette Lot#: [id number].  Exp: 03/2011.    Anticoagulation Management Assessment/Plan:      The patient's current anticoagulation dose is Coumadin 5 mg  tabs: Take as directed by coumadin clinic..  The target INR is 2.0-3.0.  The next INR is due 02/18/2010.  Anticoagulation instructions were given to patient.  Results were reviewed/authorized by Tammy Sours PharmD.  She was notified by Tammy Sours PharmD.         Prior Anticoagulation Instructions: INR 3.2  Skip tomorrow's dose of Coumadin then resume same dose of 1 tablet every day except 1 1/2 tablets on Wednesday and Saturday.   Current Anticoagulation Instructions: INR 2.5  Continue 1 tablet everyday except 1 and 1/2 tablets on Wednesday and Saturday. Recheck in 4 weeks.

## 2010-07-23 NOTE — Medication Information (Signed)
Summary: rov/ewj  Anticoagulant Therapy  Managed by: Bethena Midget, RN, BSN PCP: Dondra Spry DO Supervising MD: Myrtis Ser MD, Tinnie Gens Indication 1: Deep Vein Thrombosis - Leg (ICD-451.1) Lab Used: [facility] [place] Site: [address] INR POC 3.1 INR RANGE 2 - 3  Dietary changes: no    Health status changes: no    Bleeding/hemorrhagic complications: no    Recent/future hospitalizations: no    Any changes in medication regimen? no    Recent/future dental: no  Any missed doses?: no       Is patient compliant with meds? yes       Allergies: 1)  ! Pcn 2)  Morphine Sulfate (Morphine Sulfate) 3)  Penicillin G Potassium (Penicillin G Potassium) 4)  Sertraline Hcl (Sertraline Hcl)  Anticoagulation Management History:      The patient is taking warfarin and comes in today for a routine follow up visit.  Positive risk factors for bleeding include an age of 75 years or older.  The bleeding index is 'intermediate risk'.  Positive CHADS2 values include History of HTN and Age > 22 years old.  The start date was 05/28/2007.  Her last INR was 2.4.  Anticoagulation responsible provider: Myrtis Ser MD, Tinnie Gens.  INR POC: 3.1.  Cuvette Lot#: [id number].  Exp: 04//2012.    Anticoagulation Management Assessment/Plan:      The patient's current anticoagulation dose is Coumadin 5 mg  tabs: Take as directed by coumadin clinic..  The target INR is 2.0-3.0.  The next INR is due 08/23/2009.  Anticoagulation instructions were given to patient.  Results were reviewed/authorized by Bethena Midget, RN, BSN.  She was notified by Bethena Midget, RN, BSN.         Prior Anticoagulation Instructions: INR 4.3 Skip coumadin dose today. Then resume 5mg s everyday except on Mondays take 7.5mg s. Recheck in 2 weeks.   Current Anticoagulation Instructions: INR 3.1 On Friday take 1/2 tablet then change dose to 1 tablet everyday. Recheck in 2 weeks.

## 2010-07-23 NOTE — Progress Notes (Signed)
Summary: RX QTY  ?  Phone Note Call from Patient   Caller: PATIENT DAUGHTER [name] Call For: [facility]  Summary of Call: WHEN THEY PICKED UP THE GABAPENTIN AND IT SHOULD HAVE BEEN 90. [facility] [address]  PLEASE UPDATE RX TO SHOW 90 AT A REFILL  Initial call taken by: Roselle Locus,  May 13, 2010 10:42 AM  Follow-up for Phone Call        ok to change rx to qty 90 Follow-up by: D. Thomos Lemons DO,  May 13, 2010 12:14 PM  Additional Follow-up for Phone Call Additional follow up Details #1::        Rx quantity updated and new rx sent to pharmacy with updated amount. Spoke with Kathlene November pharmacist to cancell previous rx and a new rx wtih update quantity of 90 will be sent to pharmacy. Additional Follow-up by: Glendell Docker CMA,  May 13, 2010 1:23 PM    New/Updated Medications: GABAPENTIN 300 MG CAPS (GABAPENTIN) Take 1 tablet by mouth three times a day as needed Prescriptions: GABAPENTIN 300 MG CAPS (GABAPENTIN) Take 1 tablet by mouth three times a day as needed  #90 x 2   Entered by:   Glendell Docker CMA   Authorized by:   D. Thomos Lemons DO   Signed by:   Glendell Docker CMA on 05/13/2010   Method used:   Electronically to        [facility] (retail)       [address]       [place], [place]  [id number]       Ph: [phone]       Fax: [phone]   RxID:   [id number]

## 2010-07-23 NOTE — Consult Note (Signed)
Summary: [facility]   Imported By: Lanelle Bal 04/29/2010 11:39:01  _____________________________________________________________________  External Attachment:    Type:   Image     Comment:   External Document

## 2010-07-23 NOTE — Medication Information (Signed)
Summary: rov/sp      Allergies Added:  Anticoagulant Therapy  Managed by: Lynann Bologna, PharmD PCP: Dondra Spry DO Supervising MD: Gala Romney MD, Reuel Boom Indication 1: Deep Vein Thrombosis - Leg (ICD-451.1) Lab Used: [facility] [place] Site: [address] INR POC 2.8 INR RANGE 2 - 3  Dietary changes: no    Health status changes: no    Bleeding/hemorrhagic complications: no    Recent/future hospitalizations: no    Any changes in medication regimen? no    Recent/future dental: no  Any missed doses?: no       Is patient compliant with meds? yes       Current Medications (verified): 1)  Gabapentin 300 Mg Caps (Gabapentin) .... One By Mouth in Am and One Tab At Bedtime 2)  Synthroid 100 Mcg Tabs (Levothyroxine Sodium) .... One By Mouth Qd 3)  Cosopt 2-0.5 %  Soln (Dorzolamide-Timolol) .... One Drop Each Eye Two Times A Day 4)  Xalatan 0.005 %  Soln (Latanoprost) .... One Drop Each Eye At Bedtime 5)  Nifedical Xl 30 Mg  Tb24 (Nifedipine) .... One By Mouth Once Daily 6)  Coumadin 5 Mg  Tabs (Warfarin Sodium) .... Take As Directed By Coumadin Clinic. 7)  Piloptic-1 1 %  Soln (Pilocarpine Hcl) .... One Drop Each Eye Four Times A Day 8)  Caltrate 600+d Plus 600-400 Mg-Unit  Chew (Calcium Carbonate-Vit D-Min) .... One By Mouth Bid 9)  Vitamin D 2000 Unit Caps (Cholecalciferol) .... One By Mouth Once Daily 10)  Freestyle Lite Test  Strp (Glucose Blood) .... Use To Test Blood Sugar Once Daily (Dx Code 790.29) 11)  Freestyle Lancets  Misc (Lancets) .... Use To Test Blood Sugar Once Daily (Dx Code 790.29) 12)  Vesicare 5 Mg Tabs (Solifenacin Succinate) .... One By Mouth Once Daily 13)  Alphagan P 0.15 % Soln (Brimonidine Tartrate) .Marland Kitchen.. 1 Drop Left Eye  Every Morning 14)  Hydrocodone-Homatropine 5-1.5 Mg/12ml Syrp (Hydrocodone-Homatropine) .... 5 Ml By Mouth Qpm Prn 15)  Diovan 80 Mg Tabs (Valsartan) .... One By Mouth Once Daily  Allergies (verified): 1)  ! Pcn 2)   Morphine Sulfate (Morphine Sulfate) 3)  Penicillin G Potassium (Penicillin G Potassium) 4)  Sertraline Hcl (Sertraline Hcl)  Anticoagulation Management History:      The patient is taking warfarin and comes in today for a routine follow up visit.  Positive risk factors for bleeding include an age of 75 years or older.  The bleeding index is 'intermediate risk'.  Positive CHADS2 values include History of HTN and Age > 75 years old.  The start date was 05/28/2007.  Her last INR was 2.4.  Anticoagulation responsible provider: Bensimhon MD, Reuel Boom.  INR POC: 2.8.  Cuvette Lot#: [id number].  Exp: 01/2011.    Anticoagulation Management Assessment/Plan:      The patient's current anticoagulation dose is Coumadin 5 mg  tabs: Take as directed by coumadin clinic..  The target INR is 2.0-3.0.  The next INR is due 12/31/2009.  Anticoagulation instructions were given to patient.  Results were reviewed/authorized by Lynann Bologna, PharmD.  She was notified by Lynann Bologna.         Prior Anticoagulation Instructions: INR 2.5 Continue 1 pill everyday except 1.5 pills on Wednesdays and Saturdays. Recheck in 2 weeks.   Current Anticoagulation Instructions: INR 2.8 The patient is to continue with the same dose of coumadin.  This dosage includes: one tab daily, except 1.5 tablets on Wednesdays and Fridays.  Next  INR on Monday, July 11 at  2:30 pm.

## 2010-07-23 NOTE — Medication Information (Signed)
Summary: rov/cb  Anticoagulant Therapy  Managed by: Bethena Midget, RN, BSN PCP: Dondra Spry DO Supervising MD: Tenny Craw MD, Gunnar Fusi Indication 1: Deep Vein Thrombosis - Leg (ICD-451.1) Lab Used: [facility] [place] [facility] Site: [address] INR POC 2.0 INR RANGE 2 - 3  Dietary changes: no    Health status changes: no    Bleeding/hemorrhagic complications: no    Recent/future hospitalizations: no    Any changes in medication regimen? no    Recent/future dental: no  Any missed doses?: no       Is patient compliant with meds? yes       Allergies: 1)  ! Pcn 2)  Morphine Sulfate (Morphine Sulfate) 3)  Penicillin G Potassium (Penicillin G Potassium)  Anticoagulation Management History:      The patient is taking warfarin and comes in today for a routine follow up visit.  Positive risk factors for bleeding include an age of 75 years or older.  The bleeding index is 'intermediate risk'.  Positive CHADS2 values include History of HTN and Age > 72 years old.  The start date was 05/28/2007.  Her last INR was 2.4.  Anticoagulation responsible provider: Tenny Craw MD, Gunnar Fusi.  INR POC: 2.0.  Cuvette Lot#: [id number].  Exp: 07/2010.    Anticoagulation Management Assessment/Plan:      The patient's current anticoagulation dose is Coumadin 5 mg  tabs: Take as directed by coumadin clinic..  The target INR is 2.0-3.0.  The next INR is due 07/25/2009.  Anticoagulation instructions were given to patient.  Results were reviewed/authorized by Bethena Midget, RN, BSN.  She was notified by Bethena Midget, RN, BSN.         Prior Anticoagulation Instructions: INR 1.9. Take 1.5 tablets tomorrow, then take 1 tablet daily except 1.5 tablets on Mondays.  Current Anticoagulation Instructions: INR 2.0 Today and tomorrow take 1 1/2 tablets then resume 1 tablet everyday except 1 1/2  tablets on Mondays. Recheck in 3-4 weeks.

## 2010-07-23 NOTE — Assessment & Plan Note (Signed)
Summary: heavily congested/mhf   Vital Signs:  Patient profile:   77 year old female Temp:     98.0 degrees F oral Pulse rate:   72 / minute Pulse rhythm:   regular Resp:     16 per minute BP sitting:   96 / 54  (left arm) Cuff size:   large CC: room 4  Chest congestion x 1 week and getting worse the last 3 days. Comments Pt currently not taking Lexapro.   Primary Care Provider:  Dondra Spry DO  CC:  room 4  Chest congestion x 1 week and getting worse the last 3 days.Marland Kitchen  History of Present Illness: Jennifer Brennan is an 77 year old female who presents today with c/o chest congestion x 3 days.  Has had night time cough at night x 3 nights,  now cough is worse during the day.  Notes cough is productive at times.  Denies fever.  Denies nasal congestion.  Denies nausea, vomitting or diarrhea.  Felt weak this AM, "like she was going to pass out." Poor appetite.    Patient's daughter notes + itching and red rash on arms since patient started zoloft.  Notes that medicine was changed to lexapro- but patient's rash and itching continued.  Lexapro was stopped on 2/14.    Allergies: 1)  ! Pcn 2)  Morphine Sulfate (Morphine Sulfate) 3)  Penicillin G Potassium (Penicillin G Potassium) 4)  Sertraline Hcl (Sertraline Hcl)  Physical Exam  General:  Elderly AA female sitting in wheel chair, awake, alert, NAD, scratching rash Head:  Normocephalic and atraumatic without obvious abnormalities. No apparent alopecia or balding. Ears:  External ear exam shows no significant lesions or deformities.  Otoscopic examination reveals clear canals, tympanic membranes are intact bilaterally without bulging, retraction, inflammation or discharge. Hearing is grossly normal bilaterally. Neck:  No deformities, masses, or tenderness noted. Lungs:  Normal respiratory effort, chest expands symmetrically. Lungs are clear to auscultation, no crackles or wheezes. Heart:  Normal rate and regular rhythm. S1 and S2 normal  without gallop, murmur, click, rub or other extra sounds. Skin:  macular red rash noted on trunk.  Forearms skin is red in bilateral AC fossa   Impression & Recommendations:  Problem # 1:  BRONCHITIS, MILD (ICD-490) Assessment New CXR is negative for pneumonia.  Will plan to treat with zithromax.  Will notify Jennifer Brennan PharmD with coumadin clinic.  Patient has f/u apt on 3/3 at coumadin clinic.  Patient to follow up on Friday, sooner if fever or increased weaknes.  Patient and family aware of plan.  Greater than 25 minute spent with patient.  Greater than 50% of this time was spent on counselling and coordination of care.   Orders: CXR- 2view (CXR)  Her updated medication list for this problem includes:    Zithromax Z-pak 250 Mg Tabs (Azithromycin) .Marland Kitchen... 2 tabs by mouth today, then one tablet by mouth daily x 4 more days    Tessalon Perles 100 Mg Caps (Benzonatate) ..... One cap by mouth three times a day as needed cough  Problem # 2:  SKIN RASH, ALLERGIC (ICD-692.9) Assessment: Deteriorated Will give short steroid taper with prednisone.  If no improvement, consider referral to allergist.  Continue off lexapro.   Her updated medication list for this problem includes:    Loratadine 10 Mg Tabs (Loratadine) ..... One by mouth qd    Prednisone 10 Mg Tabs (Prednisone) .Marland KitchenMarland KitchenMarland KitchenMarland Kitchen 4 tabs by mouth today, then 3 tabs on 3/2,  then 2 tabson 3/3 then one tab on 3/4 then stop  Problem # 3:  HYPERTENSION (ICD-401.9) Assessment: Comment Only Patient's BP is low.  I suspect that she is mildly dehydrated.  I instructed family to to hold BP meds tonight and only to administer tomorrow if BP is greater than 110/80.  Instructed patient to drink 8 glasses water/juice a day.   Her updated medication list for this problem includes:    Nifedical Xl 30 Mg Tb24 (Nifedipine) ..... One by mouth once daily    Enalapril Maleate 5 Mg Tabs (Enalapril maleate) ..... One by mouth qd  BP today: 96/54 Prior BP: 122/70  (07/20/2009)  Prior 10 Yr Risk Heart Disease: Not enough information (12/05/2008)  Labs Reviewed: K+: 4.2 (07/02/2009) Creat: : 1.2 (07/02/2009)     Complete Medication List: 1)  Gabapentin 300 Mg Caps (Gabapentin) .... One by mouth in am and one tab at bedtime 2)  Synthroid 100 Mcg Tabs (Levothyroxine sodium) .... One by mouth qd 3)  Cosopt 2-0.5 % Soln (Dorzolamide-timolol) .... One drop each eye two times a day 4)  Xalatan 0.005 % Soln (Latanoprost) .... One drop each eye at bedtime 5)  Nifedical Xl 30 Mg Tb24 (Nifedipine) .... One by mouth once daily 6)  Coumadin 5 Mg Tabs (Warfarin sodium) .... Take as directed by coumadin clinic. 7)  Piloptic-1 1 % Soln (Pilocarpine hcl) .... One drop each eye four times a day 8)  Enalapril Maleate 5 Mg Tabs (Enalapril maleate) .... One by mouth qd 9)  Caltrate 600+d Plus 600-400 Mg-unit Chew (Calcium carbonate-vit d-min) .... One by mouth bid 10)  Loratadine 10 Mg Tabs (Loratadine) .... One by mouth qd 11)  Vitamin D 2000 Unit Caps (Cholecalciferol) .... One by mouth once daily 12)  Freestyle Lite Test Strp (Glucose blood) .... Use to test blood sugar once daily (dx code 790.29) 13)  Freestyle Lancets Misc (Lancets) .... Use to test blood sugar once daily (dx code 790.29) 14)  Sanctura Xr 60 Mg Xr24h-cap (Trospium chloride) .... Take 1 tablet by mouth once a day 15)  Zithromax Z-pak 250 Mg Tabs (Azithromycin) .... 2 tabs by mouth today, then one tablet by mouth daily x 4 more days 16)  Tessalon Perles 100 Mg Caps (Benzonatate) .... One cap by mouth three times a day as needed cough 17)  Prednisone 10 Mg Tabs (Prednisone) .... 4 tabs by mouth today, then 3 tabs on 3/2, then 2 tabson 3/3 then one tab on 3/4 then stop  Patient Instructions: 1)  Please keep your appointment with coumadin clinic.   2)  Hold blood pressure medicines if blood pressure is less than 110/80. 3)  No BP med tonight. 4)  Drink 6-8 glasses of water or juice a day. 5)  Please  follow up on Friday, sooner if fever increasing weakness, or worsening cough. Prescriptions: PREDNISONE 10 MG TABS (PREDNISONE) 4 tabs by mouth today, then 3 tabs on 3/2, then 2 tabson 3/3 then one tab on 3/4 then stop  #10 x 0   Entered and Authorized by:   Lemont Fillers FNP   Signed by:   Lemont Fillers FNP on 08/21/2009   Method used:   Electronically to        [facility] (retail)       [address]       [place], [place]  [address]       Ph: [phone]  Fax: [phone]   RxID:   [id number] TESSALON PERLES 100 MG CAPS (BENZONATATE) one cap by mouth three times a day as needed cough  #30 x 0   Entered and Authorized by:   Lemont Fillers FNP   Signed by:   Lemont Fillers FNP on 08/21/2009   Method used:   Electronically to        [facility] (retail)       [address]       [place], [place]  [address]       Ph: [phone]       Fax: [phone]   RxID:   [id number] ZITHROMAX Z-PAK 250 MG TABS (AZITHROMYCIN) 2 tabs by mouth today, then one tablet by mouth daily x 4 more days  #1 pack x 0   Entered and Authorized by:   Lemont Fillers FNP   Signed by:   Lemont Fillers FNP on 08/21/2009   Method used:   Electronically to        [facility] (retail)       [address]       [place], [place]  [id number]       Ph: [phone]       Fax: [phone]   RxID:   [id number]   Current Allergies (reviewed today): ! PCN MORPHINE SULFATE (MORPHINE SULFATE) PENICILLIN G POTASSIUM (PENICILLIN G POTASSIUM) SERTRALINE HCL (SERTRALINE HCL)

## 2010-07-23 NOTE — Progress Notes (Signed)
Summary: [facility] Refill  Phone Note Refill Request Message from:  Pharmacy on April 15, 2010 9:15 AM  Refills Requested: Medication #1:  GABAPENTIN 300 MG CAPS one by mouth in AM and one tab at bedtime   Dosage confirmed as above?Dosage Confirmed   Brand Name Necessary? No   Supply Requested: 1 month   Last Refilled: 03/11/2010  Method Requested: Electronic Next Appointment Scheduled: 04-16-10 2PM COUMADIN Initial call taken by: Roselle Locus,  April 15, 2010 9:16 AM  Follow-up for Phone Call        Rx completed in Dr. Tiajuana Amass Follow-up by: Glendell Docker CMA,  April 15, 2010 10:48 AM    Prescriptions: GABAPENTIN 300 MG CAPS (GABAPENTIN) one by mouth in AM and one tab at bedtime  #60 x 2   Entered by:   Glendell Docker CMA   Authorized by:   D. Thomos Lemons DO   Signed by:   Glendell Docker CMA on 04/15/2010   Method used:   Electronically to        [facility] (retail)       [address]       [place], [place]  [id number]       Ph: [phone]       Fax: [phone]   RxID:   [id number]

## 2010-07-24 ENCOUNTER — Encounter (HOSPITAL_BASED_OUTPATIENT_CLINIC_OR_DEPARTMENT_OTHER): Payer: Medicare Other | Admitting: Oncology

## 2010-07-24 ENCOUNTER — Encounter: Payer: Self-pay | Admitting: Internal Medicine

## 2010-07-24 ENCOUNTER — Other Ambulatory Visit: Payer: Self-pay

## 2010-07-24 DIAGNOSIS — C50219 Malignant neoplasm of upper-inner quadrant of unspecified female breast: Secondary | ICD-10-CM

## 2010-07-25 NOTE — Medication Information (Signed)
Summary: cvrr  Anticoagulant Therapy  Managed by: Reina Fuse, PharmD PCP: Dondra Spry DO Supervising MD: Graciela Husbands MD, [name] Indication 1: Deep Vein Thrombosis - Leg (ICD-451.1) Lab Used: [facility] [place] Site: [address] INR POC 2.4 INR RANGE 2 - 3  Dietary changes: no    Health status changes: no    Bleeding/hemorrhagic complications: no    Recent/future hospitalizations: no    Any changes in medication regimen? no    Recent/future dental: no  Any missed doses?: no       Is patient compliant with meds? yes      Comments: Pt was off Coumadin from November 26th-30th for a procedure. Was getting Lovenox at the [facility] until a couple of days ago. Resumed regular Coumadin schedule.   Allergies: 1)  ! Pcn 2)  Morphine Sulfate (Morphine Sulfate) 3)  Penicillin G Potassium (Penicillin G Potassium) 4)  Sertraline Hcl (Sertraline Hcl)  Anticoagulation Management History:      The patient is taking warfarin and comes in today for a routine follow up visit.  Positive risk factors for bleeding include an age of 59 years or older.  The bleeding index is 'intermediate risk'.  Positive CHADS2 values include History of HTN and Age > 64 years old.  The start date was 05/28/2007.  Her last INR was 2.37.  Anticoagulation responsible Authur Cubit: Graciela Husbands MD, [name].  INR POC: 2.4.  Exp: 05/2011.    Anticoagulation Management Assessment/Plan:      The patient's current anticoagulation dose is Coumadin 5 mg  tabs: Take as directed by coumadin clinic..  The target INR is 2.0-3.0.  The next INR is due 07/04/2010.  Anticoagulation instructions were given to patient.  Results were reviewed/authorized by Reina Fuse, PharmD.  She was notified by Reina Fuse, PharmD.         Prior Anticoagulation Instructions: INR 3 Continue taking 1.5 tablets on wednesday and saturday. And 1 tablet all other days. Recheck in 3 weeks.   Current Anticoagulation Instructions: INR 2.4  Continue  taking Coumadin 1 tab (5 mg) every day except for Coumadin 1.5 tabs (7.5 mg) on Wed and Fri. Return to clinic in 4 weeks.

## 2010-07-25 NOTE — Letter (Signed)
Summary: Primary Care Consult Scheduled Letter  [facility]  [address]   [place], [place] [address]   Phone: [phone]  Fax: [phone]      06/26/2010 MRN: [id number]  [facility] [address] [place], [place]  [address]    Dear Ms. Momon,      We have scheduled an appointment for you.  At the recommendation of Dr.YOO, we have scheduled you a for a Bone Density  on  July 09 2010  at 2pm .  Their address [address] [place]  . The office phone number is [phone].  If this appointment day and time is not convenient for you, please feel free to call the office of the doctor you are being referred to at the number listed above and reschedule the appointment.     It is important for you to keep your scheduled appointments. We are here to make sure you are given good patient care.    Thank you,  Darral Dash Patient Care Coordinator [place] [facility]

## 2010-07-25 NOTE — Letter (Signed)
Summary: [facility]  [facility]   Imported By: Lanelle Bal 06/21/2010 09:32:26  _____________________________________________________________________  External Attachment:    Type:   Image     Comment:   External Document

## 2010-07-25 NOTE — Progress Notes (Signed)
Summary: Referral Nursing Assistant  Phone Note Call from Patient Call back at Home Phone [phone] Call back at [phone]   Caller: Daughter-[name]  Call For: D. Thomos Lemons DO Summary of Call: patients daughters called and left voice message wanting to know if Dr Artist Pais would provide a referral for a nursing assistant to come into the home to help patient. Her message states she is there helping patient, but is not sure how much longer she will be able to assist patient due to her own health issues Initial call taken by: Glendell Docker CMA,  June 19, 2010 11:33 AM  Follow-up for Phone Call        Home health referral should have been made.   I am unfamiliar with the patient, and will defer further referrals to Dr. Artist Pais upon his return.  If any urgent needs, I will be happy to see her in the office. Follow-up by: Lemont Fillers FNP,  June 19, 2010 11:48 AM  Additional Follow-up for Phone Call Additional follow up Details #1::        Referral to [facility]  Additional Follow-up by: Darral Dash,  June 27, 2010 8:18 AM

## 2010-07-25 NOTE — Progress Notes (Signed)
Summary: In home assistance  after surgery  Phone Note Call from Patient Call back at Home Phone [phone]   Caller: Daughters Jennifer Brennan & Jennifer Brennan  Call For: Jennifer Spry DO Summary of Call: patients daughters called  and left voice message stating patient had a mastectomy on 11/30 and they would like to know if Dr Artist Pais would provide a referral for patient to have physical & occupational therapy. They would aslo like to know if an order to provided for the patient to have in homecare nurse to help her with recovery. Jennifer Brennan can be reached at patients home number and Jennifer Brennan can be reached at [phone]. Initial call taken by: Glendell Docker CMA,  June 07, 2010 4:24 PM  Follow-up for Phone Call        Referral order fax   to  [facility]     , rec'd per Josh Follow-up by: Darral Dash,  June 11, 2010 11:06 AM

## 2010-07-25 NOTE — Medication Information (Signed)
Summary: rov/ewj   Anticoagulant Therapy  Managed by: Windell Hummingbird, RN PCP: Dondra Spry DO Supervising MD: Myrtis Ser MD, Tinnie Gens Indication 1: Deep Vein Thrombosis - Leg (ICD-451.1) Lab Used: [facility] [place] Site: [address] INR POC 2.5 INR RANGE 2 - 3  Dietary changes: yes       Details: Not as many greens  Health status changes: no    Bleeding/hemorrhagic complications: no    Recent/future hospitalizations: no    Any changes in medication regimen? no    Recent/future dental: no  Any missed doses?: no       Is patient compliant with meds? yes       Allergies: 1)  ! Pcn 2)  Morphine Sulfate (Morphine Sulfate) 3)  Penicillin G Potassium (Penicillin G Potassium) 4)  Sertraline Hcl (Sertraline Hcl)  Anticoagulation Management History:      The patient is taking warfarin and comes in today for a routine follow up visit.  Positive risk factors for bleeding include an age of 9 years or older.  The bleeding index is 'intermediate risk'.  Positive CHADS2 values include History of HTN and Age > 65 years old.  The start date was 05/28/2007.  Her last INR was 2.37.  Anticoagulation responsible provider: Myrtis Ser MD, Tinnie Gens.  INR POC: 2.5.  Cuvette Lot#: [id number].  Exp: 06/2011.    Anticoagulation Management Assessment/Plan:      The patient's current anticoagulation dose is Coumadin 5 mg  tabs: Take as directed by coumadin clinic..  The target INR is 2.0-3.0.  The next INR is due 08/08/2010.  Anticoagulation instructions were given to patient.  Results were reviewed/authorized by Windell Hummingbird, RN.  She was notified by Windell Hummingbird, RN.         Prior Anticoagulation Instructions: INR 4.1  Skip today's dosage of Coumadin, then decrease dosage to 1 tablet daily except 1.5 tablets on Wednesdays.  Recheck in 2 weeks.    Current Anticoagulation Instructions: INR 2.5 Continue taking 1 tablet every day except take 1.5 on Wednesdays. Recheck in 3 weeks.

## 2010-07-25 NOTE — Letter (Signed)
Summary: [facility]  [facility]   Imported By: Lanelle Bal 07/03/2010 14:17:24  _____________________________________________________________________  External Attachment:    Type:   Image     Comment:   External Document

## 2010-07-25 NOTE — Letter (Signed)
Summary: [facility]   Imported By: Lanelle Bal 07/03/2010 14:18:48  _____________________________________________________________________  External Attachment:    Type:   Image     Comment:   External Document

## 2010-07-25 NOTE — Miscellaneous (Signed)
Summary: BONE DENSITY  Clinical Lists Changes  Orders: Added new Test order of T-Bone Densitometry (77080) - Signed Added new Test order of T-Lumbar Vertebral Assessment (77082) - Signed 

## 2010-07-25 NOTE — Letter (Signed)
Summary: [facility]  [facility]   Imported By: Lanelle Bal 07/03/2010 14:18:11  _____________________________________________________________________  External Attachment:    Type:   Image     Comment:   External Document

## 2010-07-25 NOTE — Progress Notes (Signed)
Summary: Mild Sedative  Phone Note Call from Patient Call back at Home Phone [phone]   Caller: Daughter-[name] Call For: D. Thomos Lemons DO Summary of Call: patients daughter called and left voice message, stating patients daughter is in ICU and the patient has not been able to sleep, and has an increased amount of stressed. Patient daughter Fulton Mole is concerned, because she was informe in order to help with the healing process from patients mastectomy, she did not need addtional stress. [name] would like to know if Dr Artist Pais would send something mild to pharmacy to help with patient sleep, and added stress.  Initial call taken by: Glendell Docker CMA,  July 01, 2010 4:50 PM  Follow-up for Phone Call        pt at high risk for having side effect from even mild sedative ( ie. dizziness and fall) I would not recommend any sedative unless stress symptoms severe.  also inform pt , thyroid blood test shows pt taking too much thyroid medication.  see rx for new dose needs repeat TSH in 2 months Follow-up by: D. Thomos Lemons DO,  July 01, 2010 5:59 PM  Additional Follow-up for Phone Call Additional follow up Details #1::        call returned to patient at [phone], patients husband Lollie Sails has been informed per Dr Artist Pais instructions. No additional questions at this time. Lab order enetered for [facility] for 08/2010 Additional Follow-up by: Glendell Docker CMA,  July 02, 2010 9:49 AM    New/Updated Medications: SYNTHROID 100 MCG TABS (LEVOTHYROXINE SODIUM) one by mouth once daily Prescriptions: SYNTHROID 100 MCG TABS (LEVOTHYROXINE SODIUM) one by mouth once daily  #30 x 3   Entered and Authorized by:   D. Thomos Lemons DO   Signed by:   D. Thomos Lemons DO on 07/01/2010   Method used:   Electronically to        [facility] (retail)       [address]       [place], [place]  [id number]       Ph: [phone]       Fax: [phone]   RxID:   [id number]

## 2010-07-25 NOTE — Assessment & Plan Note (Signed)
Summary: 3 MONTH FOLLOW UP/MHF   Vital Signs:  Patient profile:   77 year old female Height:      60 inches Weight:      149.75 pounds BMI:     29.35 O2 Sat:      93 % on Room air Temp:     97.9 degrees F oral Pulse rate:   55 / minute Resp:     18 per minute BP sitting:   118 / 60  (right arm) Cuff size:   large  Vitals Entered By: Glendell Docker CMA (June 25, 2010 4:32 PM)  O2 Flow:  Room air CC: 3 month follow up  Is Patient Diabetic? No Pain Assessment Patient in pain? no        Primary Care Provider:  Dondra Spry DO  CC:  3 month follow up .  History of Present Illness:  77 y/o female for follow up pt tolerated breast surgery well husband notes mood is worse daughter has been supportive  Preventive Screening-Counseling & Management  Alcohol-Tobacco     Smoking Status: quit  Allergies: 1)  ! Pcn 2)  Morphine Sulfate (Morphine Sulfate) 3)  Penicillin G Potassium (Penicillin G Potassium) 4)  Sertraline Hcl (Sertraline Hcl)  Past History:  Past Medical History: Hypertension Hypothyroidism    Seizure disorder       Cerebrovascular accident, hx of   History of DVT - Right   Anticoagulation therapy   severe glaucoma    Depression / anxiety Overactive bladder  Past Surgical History: Hysterectomy Rt Hip Replacement      B/L laser eye surgery             Social History: Retired  Married      Never Smoked      Alcohol use-no              Physical Exam  General:  alert, well-developed, and well-nourished.   Lungs:  normal respiratory effort and normal breath sounds.   Heart:  normal rate, regular rhythm, and no gallop.   Extremities:  trace left pedal edema and trace right pedal edema.     Impression & Recommendations:  Problem # 1:  ADENOCARCINOMA, LEFT BREAST (ICD-174.9)  S/P left mastectomy final path showed 2.2 cm, grade 3 , invasive ductal carcinoma with ample margins and a neg sentinel lymph node ER/PR+  HER2/neu-  letrozole (aromatase inhibitor)  cost prohibitive  Orders: T-CBC No Diff ([id number])  Problem # 2:  OSTEOPENIA (ICD-733.90) previously tolerated boniva infusions repeat DEXA  Orders: Radiology Referral (Radiology)  Problem # 3:  HYPERTENSION (ICD-401.9) Assessment: Improved  Her updated medication list for this problem includes:    Nifedical Xl 30 Mg Tb24 (Nifedipine) ..... One by mouth once daily    Diovan 80 Mg Tabs (Valsartan) ..... One by mouth once daily  Orders: T-Basic Metabolic Panel [phone])  BP today: 118/60 Prior BP: 142/72 (04/11/2010)  Prior 10 Yr Risk Heart Disease: Not enough information (12/05/2008)  Labs Reviewed: K+: 4.7 (02/12/2010) Creat: : 1.14 (02/12/2010)     Problem # 4:  VITAMIN D DEFICIENCY (ICD-268.9)  Orders: T- * Misc. Laboratory test ([phone])  Complete Medication List: 1)  Gabapentin 300 Mg Caps (Gabapentin) .... Take 1 tablet by mouth three times a day as needed 2)  Synthroid 100 Mcg Tabs (Levothyroxine sodium) .... One by mouth once daily 3)  Cosopt 2-0.5 % Soln (Dorzolamide-timolol) .... One drop each eye two times a day 4)  Xalatan 0.005 %  Soln (Latanoprost) .... One drop each eye at bedtime 5)  Nifedical Xl 30 Mg Tb24 (Nifedipine) .... One by mouth once daily 6)  Coumadin 5 Mg Tabs (Warfarin sodium) .... Take as directed by coumadin clinic. 7)  Piloptic-1 1 % Soln (Pilocarpine hcl) .... One drop each eye four times a day 8)  Caltrate 600+d Plus 600-400 Mg-unit Chew (Calcium carbonate-vit d-min) .... One by mouth bid 9)  Freestyle Lite Test Strp (Glucose blood) .... Use to test blood sugar once daily (dx code 790.29) 10)  Vesicare 5 Mg Tabs (Solifenacin succinate) .... One by mouth once daily 11)  Alphagan P 0.15 % Soln (Brimonidine tartrate) .Marland Kitchen.. 1 drop left eye  every morning 12)  Diovan 80 Mg Tabs (Valsartan) .... One by mouth once daily 13)  Vitamin D3 1000 Unit Tabs (Cholecalciferol) .... Take 1 tablet by mouth once a  day 14)  Stool Softener 100 Mg Caps (Docusate sodium) .... Take 1 capsule by mouth once a day  Other Orders: T-TSH ([id number])  Patient Instructions: 1)  Please schedule a follow-up appointment in 4 months. Prescriptions: GABAPENTIN 300 MG CAPS (GABAPENTIN) Take 1 tablet by mouth three times a day as needed  #90 x 5   Entered and Authorized by:   D. Thomos Lemons DO   Signed by:   D. Thomos Lemons DO on 06/25/2010   Method used:   Electronically to        [facility] (retail)       [address]       [place], [place]  [id number]       Ph: [phone]       Fax: [phone]   RxID:   [id number] NIFEDICAL XL 30 MG  TB24 (NIFEDIPINE) one by mouth once daily  #30 Tablet x 5   Entered and Authorized by:   D. Thomos Lemons DO   Signed by:   D. Thomos Lemons DO on 06/25/2010   Method used:   Electronically to        [facility] [address] (retail)       [address]       [place], [place]  [id number]       Ph: [phone]       Fax: [phone]   RxID:   [id number] DIOVAN 80 MG TABS (VALSARTAN) one by mouth once daily  #30 Tablet x 5   Entered and Authorized by:   D. Thomos Lemons DO   Signed by:   D. Thomos Lemons DO on 06/25/2010   Method used:   Electronically to        [facility] [facility] (retail)       [address]       [place], [place]  [id number]       Ph: [phone]       Fax: [phone]   RxID:   [id number]    Orders Added: 1)  T-CBC No Diff [[id number]] 2)  T-Basic Metabolic Panel [[id number]] 3)  T-TSH [[id number]] 4)  T- * Misc. Laboratory test [phone] 5)  Radiology Referral [Radiology] 6)  Est. Patient Level III [[address]]    Current Allergies (reviewed today): ! PCN MORPHINE SULFATE (MORPHINE SULFATE) PENICILLIN G POTASSIUM (PENICILLIN G POTASSIUM) SERTRALINE HCL (SERTRALINE HCL)

## 2010-07-25 NOTE — Medication Information (Signed)
Summary: rov/tm  Anticoagulant Therapy  Managed by: Cloyde Reams, RN, BSN PCP: Dondra Spry DO Supervising MD: Ladona Ridgel MD, Sharlot Gowda Indication 1: Deep Vein Thrombosis - Leg (ICD-451.1) Lab Used: [facility] [place] Site: [address] INR POC 4.1 INR RANGE 2 - 3  Dietary changes: no    Health status changes: no    Bleeding/hemorrhagic complications: no    Recent/future hospitalizations: no    Any changes in medication regimen? no    Recent/future dental: no  Any missed doses?: yes     Details: Off Coumadin for surgery. Resumed Coumadin 05/27/10 4 days after surgery  Is patient compliant with meds? yes      Comments: Bridged with Lovenox after surgery.  Allergies: 1)  ! Pcn 2)  Morphine Sulfate (Morphine Sulfate) 3)  Penicillin G Potassium (Penicillin G Potassium) 4)  Sertraline Hcl (Sertraline Hcl)  Anticoagulation Management History:      The patient is taking warfarin and comes in today for a routine follow up visit.  Positive risk factors for bleeding include an age of 16 years or older.  The bleeding index is 'intermediate risk'.  Positive CHADS2 values include History of HTN and Age > 19 years old.  The start date was 05/28/2007.  Her last INR was 2.37.  Anticoagulation responsible provider: Ladona Ridgel MD, Sharlot Gowda.  INR POC: 4.1.  Cuvette Lot#: [id number].  Exp: 05/2011.    Anticoagulation Management Assessment/Plan:      The patient's current anticoagulation dose is Coumadin 5 mg  tabs: Take as directed by coumadin clinic..  The target INR is 2.0-3.0.  The next INR is due 07/18/2010.  Anticoagulation instructions were given to patient.  Results were reviewed/authorized by Cloyde Reams, RN, BSN.  She was notified by Cloyde Reams RN.         Prior Anticoagulation Instructions: INR 2.4  Continue taking Coumadin 1 tab (5 mg) every day except for Coumadin 1.5 tabs (7.5 mg) on Wed and Fri. Return to clinic in 4 weeks.   Current Anticoagulation Instructions: INR  4.1  Skip today's dosage of Coumadin, then decrease dosage to 1 tablet daily except 1.5 tablets on Wednesdays.  Recheck in 2 weeks.

## 2010-08-07 ENCOUNTER — Ambulatory Visit: Payer: Self-pay | Admitting: Physical Therapy

## 2010-08-07 ENCOUNTER — Ambulatory Visit: Payer: Medicare Other | Attending: Oncology | Admitting: Physical Therapy

## 2010-08-07 DIAGNOSIS — M25549 Pain in joints of unspecified hand: Secondary | ICD-10-CM | POA: Insufficient documentation

## 2010-08-07 DIAGNOSIS — M79609 Pain in unspecified limb: Secondary | ICD-10-CM | POA: Insufficient documentation

## 2010-08-07 DIAGNOSIS — I890 Lymphedema, not elsewhere classified: Secondary | ICD-10-CM | POA: Insufficient documentation

## 2010-08-07 DIAGNOSIS — IMO0001 Reserved for inherently not codable concepts without codable children: Secondary | ICD-10-CM | POA: Insufficient documentation

## 2010-08-08 ENCOUNTER — Encounter (INDEPENDENT_AMBULATORY_CARE_PROVIDER_SITE_OTHER): Payer: Medicare Other

## 2010-08-08 ENCOUNTER — Encounter: Payer: Self-pay | Admitting: Internal Medicine

## 2010-08-08 DIAGNOSIS — I8010 Phlebitis and thrombophlebitis of unspecified femoral vein: Secondary | ICD-10-CM

## 2010-08-08 DIAGNOSIS — Z7901 Long term (current) use of anticoagulants: Secondary | ICD-10-CM

## 2010-08-08 LAB — CONVERTED CEMR LAB: POC INR: 2.8

## 2010-08-13 DIAGNOSIS — I82409 Acute embolism and thrombosis of unspecified deep veins of unspecified lower extremity: Secondary | ICD-10-CM

## 2010-08-13 DIAGNOSIS — Z8679 Personal history of other diseases of the circulatory system: Secondary | ICD-10-CM

## 2010-08-14 NOTE — Letter (Signed)
Summary: [facility]   Imported By: Lanelle Bal 08/02/2010 11:06:54  _____________________________________________________________________  External Attachment:    Type:   Image     Comment:   External Document

## 2010-08-14 NOTE — Medication Information (Signed)
Summary: rov/sp   Anticoagulant Therapy  Managed by: Georgina Pillion, PharmD PCP: Dondra Spry DO Supervising MD: Tenny Craw MD, Gunnar Fusi Indication 1: Deep Vein Thrombosis - Leg (ICD-451.1) Lab Used: [facility] [place] Site: [address] INR POC 2.8 INR RANGE 2 - 3  Dietary changes: no    Health status changes: no    Bleeding/hemorrhagic complications: no    Recent/future hospitalizations: no    Any changes in medication regimen? no    Recent/future dental: no  Any missed doses?: no       Is patient compliant with meds? yes       Allergies: 1)  ! Pcn 2)  Morphine Sulfate (Morphine Sulfate) 3)  Penicillin G Potassium (Penicillin G Potassium) 4)  Sertraline Hcl (Sertraline Hcl)  Anticoagulation Management History:      Positive risk factors for bleeding include an age of 75 years or older.  The bleeding index is 'intermediate risk'.  Positive CHADS2 values include History of HTN and Age > 75 years old.  The start date was 05/28/2007.  Her last INR was 2.37.  Anticoagulation responsible provider: Tenny Craw MD, Gunnar Fusi.  INR POC: 2.8.  Cuvette Lot#: [id number].  Exp: 06/2011.    Anticoagulation Management Assessment/Plan:      The patient's current anticoagulation dose is Coumadin 5 mg  tabs: Take as directed by coumadin clinic..  The target INR is 2.0-3.0.  The next INR is due 09/05/2010.  Anticoagulation instructions were given to patient.  Results were reviewed/authorized by Georgina Pillion, PharmD.  She was notified by Georgina Pillion PharmD.         Prior Anticoagulation Instructions: INR 2.5 Continue taking 1 tablet every day except take 1.5 on Wednesdays. Recheck in 3 weeks.  Current Anticoagulation Instructions: Continue current regimen of 1 tablet (5 mg) EXCEPT for 1 1/2 tablets (7.5 mg) on Wednesdays only.  INR 2.8

## 2010-08-16 ENCOUNTER — Ambulatory Visit: Payer: Self-pay | Admitting: Internal Medicine

## 2010-08-20 ENCOUNTER — Ambulatory Visit: Payer: Self-pay | Admitting: Internal Medicine

## 2010-08-20 NOTE — Letter (Signed)
Summary: [facility]   Imported By: Maryln Gottron 08/15/2010 15:31:45  _____________________________________________________________________  External Attachment:    Type:   Image     Comment:   External Document

## 2010-09-03 LAB — DIFFERENTIAL
Basophils Absolute: 0 10*3/uL (ref 0.0–0.1)
Basophils Relative: 0 % (ref 0–1)
Lymphocytes Relative: 30 % (ref 12–46)
Monocytes Relative: 11 % (ref 3–12)
Neutro Abs: 2.9 10*3/uL (ref 1.7–7.7)
Neutrophils Relative %: 51 % (ref 43–77)

## 2010-09-03 LAB — PROTIME-INR: INR: 1.2 (ref 0.00–1.49)

## 2010-09-03 LAB — BASIC METABOLIC PANEL
CO2: 23 mEq/L (ref 19–32)
Calcium: 8.5 mg/dL (ref 8.4–10.5)
Chloride: 109 mEq/L (ref 96–112)
Creatinine, Ser: 1.07 mg/dL (ref 0.4–1.2)
GFR calc Af Amer: 56 mL/min — ABNORMAL LOW (ref >60)
GFR calc Af Amer: 59 mL/min — ABNORMAL LOW (ref >60)
GFR calc non Af Amer: 46 mL/min — ABNORMAL LOW (ref >60)
Glucose, Bld: 121 mg/dL — ABNORMAL HIGH (ref 70–99)
Potassium: 3.7 mEq/L (ref 3.5–5.1)
Sodium: 137 mEq/L (ref 135–145)
Sodium: 140 mEq/L (ref 135–145)

## 2010-09-03 LAB — CBC
Hemoglobin: 12.2 g/dL (ref 12.0–15.0)
Hemoglobin: 13.3 g/dL (ref 12.0–15.0)
MCH: 26.7 pg (ref 26.0–34.0)
MCHC: 33.5 g/dL (ref 30.0–36.0)
MCV: 80 fL (ref 78.0–100.0)
Platelets: 175 10*3/uL (ref 150–400)
RBC: 4.99 MIL/uL (ref 3.87–5.11)
WBC: 5.6 10*3/uL (ref 4.0–10.5)
WBC: 8.6 10*3/uL (ref 4.0–10.5)

## 2010-09-04 LAB — BASIC METABOLIC PANEL
BUN: 15 mg/dL (ref 6–23)
Calcium: 9.3 mg/dL (ref 8.4–10.5)
GFR calc non Af Amer: 47 mL/min — ABNORMAL LOW (ref >60)
Glucose, Bld: 101 mg/dL — ABNORMAL HIGH (ref 70–99)
Sodium: 144 mEq/L (ref 135–145)

## 2010-09-04 LAB — CBC
MCHC: 33.2 g/dL (ref 30.0–36.0)
RDW: 14.7 % (ref 11.5–15.5)

## 2010-09-04 LAB — SURGICAL PCR SCREEN
MRSA, PCR: NEGATIVE
Staphylococcus aureus: NEGATIVE

## 2010-09-05 ENCOUNTER — Encounter (INDEPENDENT_AMBULATORY_CARE_PROVIDER_SITE_OTHER): Payer: Medicare Other

## 2010-09-05 ENCOUNTER — Encounter: Payer: Self-pay | Admitting: Internal Medicine

## 2010-09-05 DIAGNOSIS — I80299 Phlebitis and thrombophlebitis of other deep vessels of unspecified lower extremity: Secondary | ICD-10-CM

## 2010-09-05 DIAGNOSIS — Z7901 Long term (current) use of anticoagulants: Secondary | ICD-10-CM

## 2010-09-05 LAB — CONVERTED CEMR LAB: POC INR: 2.6

## 2010-09-09 ENCOUNTER — Other Ambulatory Visit: Payer: Self-pay

## 2010-09-10 NOTE — Medication Information (Signed)
Summary: rov/ewj  Anticoagulant Therapy  Managed by: Cloyde Reams, RN, BSN PCP: Dondra Spry DO Supervising MD: Johney Frame MD, Fayrene Fearing Indication 1: Deep Vein Thrombosis - Leg (ICD-451.1) Lab Used: [facility] [place] Site: [address] INR POC 2.6 INR RANGE 2 - 3  Dietary changes: no    Health status changes: no    Bleeding/hemorrhagic complications: no    Recent/future hospitalizations: no    Any changes in medication regimen? no    Recent/future dental: no  Any missed doses?: no       Is patient compliant with meds? yes       Allergies: 1)  ! Pcn 2)  Morphine Sulfate (Morphine Sulfate) 3)  Penicillin G Potassium (Penicillin G Potassium) 4)  Sertraline Hcl (Sertraline Hcl)  Anticoagulation Management History:      The patient is taking warfarin and comes in today for a routine follow up visit.  Positive risk factors for bleeding include an age of 75 years or older.  The bleeding index is 'intermediate risk'.  Positive CHADS2 values include History of HTN and Age > 45 years old.  The start date was 05/28/2007.  Her last INR was 2.37.  Anticoagulation responsible provider: Granvel Proudfoot MD, Fayrene Fearing.  INR POC: 2.6.  Cuvette Lot#: [id number].  Exp: 08/2011.    Anticoagulation Management Assessment/Plan:      The patient's current anticoagulation dose is Coumadin 5 mg  tabs: Take as directed by coumadin clinic..  The target INR is 2.0-3.0.  The next INR is due 10/03/2010.  Anticoagulation instructions were given to patient.  Results were reviewed/authorized by Cloyde Reams, RN, BSN.  She was notified by Cloyde Reams RN.         Prior Anticoagulation Instructions: Continue current regimen of 1 tablet (5 mg) EXCEPT for 1 1/2 tablets (7.5 mg) on Wednesdays only.  INR 2.8  Current Anticoagulation Instructions: INR 2.6  Continue on same dosage 1 tablet daily except 1.5 tablets on Wednesdays.  Recheck in 4 weeks.

## 2010-10-03 ENCOUNTER — Ambulatory Visit (INDEPENDENT_AMBULATORY_CARE_PROVIDER_SITE_OTHER): Payer: MEDICARE | Admitting: *Deleted

## 2010-10-03 DIAGNOSIS — Z8679 Personal history of other diseases of the circulatory system: Secondary | ICD-10-CM

## 2010-10-03 DIAGNOSIS — I82409 Acute embolism and thrombosis of unspecified deep veins of unspecified lower extremity: Secondary | ICD-10-CM

## 2010-10-03 LAB — POCT INR: INR: 3.6

## 2010-10-03 NOTE — Patient Instructions (Signed)
Skip your dose today. Then resume normal schedule of 1.5 tablets on Wednesday and 1 tablet all other days. Recheck in 2 weeks.

## 2010-10-14 ENCOUNTER — Other Ambulatory Visit: Payer: Self-pay | Admitting: Cardiology

## 2010-10-17 ENCOUNTER — Ambulatory Visit (INDEPENDENT_AMBULATORY_CARE_PROVIDER_SITE_OTHER): Payer: Medicare Other | Admitting: *Deleted

## 2010-10-17 DIAGNOSIS — I82409 Acute embolism and thrombosis of unspecified deep veins of unspecified lower extremity: Secondary | ICD-10-CM

## 2010-10-17 DIAGNOSIS — Z8679 Personal history of other diseases of the circulatory system: Secondary | ICD-10-CM

## 2010-10-28 ENCOUNTER — Encounter: Payer: Self-pay | Admitting: Internal Medicine

## 2010-10-29 ENCOUNTER — Encounter: Payer: Self-pay | Admitting: Internal Medicine

## 2010-10-29 ENCOUNTER — Ambulatory Visit (INDEPENDENT_AMBULATORY_CARE_PROVIDER_SITE_OTHER): Payer: Medicare Other | Admitting: Internal Medicine

## 2010-10-29 DIAGNOSIS — C50919 Malignant neoplasm of unspecified site of unspecified female breast: Secondary | ICD-10-CM

## 2010-10-29 DIAGNOSIS — M949 Disorder of cartilage, unspecified: Secondary | ICD-10-CM

## 2010-10-29 DIAGNOSIS — H6120 Impacted cerumen, unspecified ear: Secondary | ICD-10-CM

## 2010-10-29 DIAGNOSIS — L723 Sebaceous cyst: Secondary | ICD-10-CM

## 2010-10-29 MED ORDER — ZOLEDRONIC ACID 5 MG/100ML IV SOLN: 5.0000 mg | Freq: Once | INTRAVENOUS | Status: AC

## 2010-10-29 NOTE — Assessment & Plan Note (Signed)
Irrigated left ear and utilized curette to remove cerumen plug

## 2010-10-29 NOTE — Assessment & Plan Note (Signed)
Invasive ductal carcinoma T2 N0 grade 3,  Strongly estrogen and progesterone receptor positive. HER2 negative.  Borderline MIB Status post left mastectomy and sentinel node sampling   Patient strongly encouraged to followup with her oncologist regarding alternative to letrozole if this medication is cost prohibitive

## 2010-10-29 NOTE — Progress Notes (Signed)
Subjective:    Patient ID: Jennifer Brennan, female    DOB: 13-Jul-1923, 77 y.o.   MRN: [id number]  HPI 77 y/o female with hx of breast ca and DVT on anticoag for routine follow up.  Pt worried about recurrence of breast ca. ( T2 N0 invasive ductal carcinoma, strongly estrogen and progesterone receptor positive) Pt was prescribed letrozole but did obtain due to cost concerns.    Pt also co rash on her chin.  Daughter er notes she picks at skin.  C/o left ear cerumen impaction.     Review of Systems Mild weight loss.  No abnormal bleeding  Past Medical History  Diagnosis Date  . Hypertension   . Hypothyroidism   . Seizure disorder   . History of cerebrovascular accident   . History of DVT (deep vein thrombosis)     right  . Encounter for long-term (current) use of other medications     coumdain therapy  . Severe stage glaucoma   . Depression with anxiety   . OAB (overactive bladder)     History   Social History  . Marital Status: Married    Spouse Name: N/A    Number of Children: N/A  . Years of Education: N/A   Occupational History  . Not on file.   Social History Main Topics  . Smoking status: Never Smoker   . Smokeless tobacco: Not on file  . Alcohol Use: No  . Drug Use: Not on file  . Sexually Active: Not on file   Other Topics Concern  . Not on file   Social History Narrative   Retired Married     Never Smoked     Alcohol use-no               Past Surgical History  Procedure Date  . Total abdominal hysterectomy   . Total hip arthroplasty     right hip  . Refractive surgery     B/L    No family history on file.  Allergies  Allergen Reactions  . Morphine Sulfate     REACTION: rash: ITCHING  . Penicillins     REACTION: urticaria (hives)  . Sertraline Hcl     REACTION: rash    Current Outpatient Prescriptions on File Prior to Visit  Medication Sig Dispense Refill  . brimonidine (ALPHAGAN) 0.15 % ophthalmic solution Place 1 drop into the  left eye every morning.        . Cholecalciferol 1000 UNITS tablet Take 1,000 Units by mouth daily.        Marland Kitchen docusate sodium (COLACE) 100 MG capsule Take 100 mg by mouth daily.        . dorzolamide-timolol (COSOPT) 22.3-6.8 MG/ML ophthalmic solution Place 1 drop into both eyes 2 (two) times daily.        Marland Kitchen glucose blood (FREESTYLE LITE) test strip Use to check blood sugar once a day       . latanoprost (XALATAN) 0.005 % ophthalmic solution Place 1 drop into both eyes at bedtime.        Marland Kitchen levothyroxine (SYNTHROID, LEVOTHROID) 100 MCG tablet Take 100 mcg by mouth daily.        Marland Kitchen NIFEdipine (PROCARDIA-XL) 30 MG (OSM) 24 hr tablet Take 30 mg by mouth daily.        . pilocarpine (PILOCAR) 1 % ophthalmic solution 1 drop 4 (four) times daily.        . solifenacin (VESICARE) 5 MG tablet Take 10 mg  by mouth daily.        . valsartan (DIOVAN) 80 MG tablet Take 80 mg by mouth daily.        Marland Kitchen warfarin (COUMADIN) 5 MG tablet take as directed BY COUMADIN CLINIC  40 tablet  3  . Calcium Carbonate-Vitamin D (CALTRATE 600+D) 600-400 MG-UNIT per tablet Take 1 tablet by mouth 2 (two) times daily.          BP 140/70  Pulse 54  Temp(Src) 98 F (36.7 C) (Oral)  Resp 16  Ht 5' (1.524 m)  Wt 154 lb (69.854 kg)  BMI 30.08 kg/m2  SpO2 96%       Objective:   Physical Exam    Constitutional: Appears well-developed and well-nourished. No distress.  Right Ear: External ear normal.  Left Ear: cerumen impaction Mouth/Throat: Oropharynx is clear and moist.  Cardiovascular: Normal rate, regular rhythm and normal heart sounds.  Exam reveals no gallop and no friction rub.   No murmur heard. Pulmonary/Chest: Effort normal and breath sounds normal.  No wheezes. No rales.  Skin: black head on chin Psychiatric: Normal mood and affect. Behavior is normal.      Assessment & Plan:

## 2010-10-29 NOTE — Assessment & Plan Note (Signed)
Pt with black head on her chin.  She was advised to use OTC facial wash containing salicylic acid.

## 2010-10-29 NOTE — Assessment & Plan Note (Signed)
We discussed risk of worsening osteoporosis with the use of aromatase inhibitor. Arrange yearly ReClast infusion. Patient intolerant of oral bisphosphonates

## 2010-10-31 ENCOUNTER — Encounter (HOSPITAL_BASED_OUTPATIENT_CLINIC_OR_DEPARTMENT_OTHER): Payer: Medicare Other | Admitting: Oncology

## 2010-10-31 ENCOUNTER — Other Ambulatory Visit: Payer: Self-pay | Admitting: Oncology

## 2010-10-31 DIAGNOSIS — C50219 Malignant neoplasm of upper-inner quadrant of unspecified female breast: Secondary | ICD-10-CM

## 2010-10-31 DIAGNOSIS — Z86718 Personal history of other venous thrombosis and embolism: Secondary | ICD-10-CM

## 2010-10-31 LAB — CBC WITH DIFFERENTIAL/PLATELET
Basophils Absolute: 0 10*3/uL (ref 0.0–0.1)
Eosinophils Absolute: 0.3 10*3/uL (ref 0.0–0.5)
HCT: 40.8 % (ref 34.8–46.6)
HGB: 13.7 g/dL (ref 11.6–15.9)
LYMPH%: 41.7 % (ref 14.0–49.7)
MCV: 77.7 fL — ABNORMAL LOW (ref 79.5–101.0)
MONO%: 10 % (ref 0.0–14.0)
NEUT#: 2.8 10*3/uL (ref 1.5–6.5)
Platelets: 193 10*3/uL (ref 145–400)
RDW: 15.9 % — ABNORMAL HIGH (ref 11.2–14.5)

## 2010-11-04 ENCOUNTER — Other Ambulatory Visit: Payer: Self-pay | Admitting: Internal Medicine

## 2010-11-05 NOTE — Telephone Encounter (Signed)
Rx refill sent to pharmacy. 

## 2010-11-05 NOTE — Discharge Summary (Signed)
NAMECOLA, GANE             ACCOUNT NO.:  [id number]   MEDICAL RECORD NO.:  [id number]          PATIENT TYPE:  INP   LOCATION:  5707                         FACILITY:  [facility]   PHYSICIAN:  Gordy Savers, MDDATE OF BIRTH:  1923/11/16   DATE OF ADMISSION:  05/26/2007  DATE OF DISCHARGE:  05/28/2007                               DISCHARGE SUMMARY   DISCHARGE DIAGNOSIS:  1. Deep venous thrombosis right lower extremity, recurrent .      Hypercoagulable panel pending at time of dictation.  2. Hyperglycemia. The patient had a blood glucose of 164 during this      admission.  Will need outpatient follow-up.  3. Hypertension.  4. Hypothyroidism on Synthroid.  5. History of cerebrovascular accident.  6. Peripheral neuropathy.  7. Glaucoma, stable in her medications.  8. Anxiety/depression.   HISTORY OF PRESENT ILLNESS:  Ms. Bubel is an 77 year old, African-  American female who was admitted on May 26, 2007 with a chief  complaint of leg pain and swelling. She was referred from Dr. Kellie Simmering  for recurrent DVT of the right lower extremity. Apparently has had DVTs  in the past. She was admitted for further evaluation and treatment.   PAST MEDICAL HISTORY:  1. Hypertension.  2. Prior DVT x2.  3. History of necrosis of the right hip.  4. History of CVA.  5. History of anemia.  6. History of hypothyroidism.   HOSPITAL COURSE:  1. Recurrent DVT.  The patient was admitted and was placed on full-      dose Lovenox. A CT angio of the chest was performed which was      negative for PE.  She was also started on Coumadin.  INR at time of      discharge is 1.1. At this time the patient wishes to be discharged      to home.  She has been transitioned over to once daily dosing of      Lovenox and her copay for Lovenox is $250 which the family is      comfortable with. This patient has requested that home health      perform the injections as she and her husband are  uncomfortable      with the injections. She will need follow-up of final      hypercoagulable panel results which are pending at time of this      dictation.  2. Hyperglycemia.  The patient was noted to have a single elevated      blood sugar during this admission of 164.  She will need outpatient      follow-up.   DISCHARGE MEDICATIONS:  1. Coumadin 5 mg p.o.  2. Lovenox 100 mg subcu injection once daily.  3. Neurontin 300 mg p.o. t.i.d.  4. Nifedical XL 60 mg p.o. daily.  5. Meloxicam 7.5 mg p.o. daily.  6. Synthroid 88 mcg p.o. daily.  7. Claritin 10 mg p.o. daily.  8. Xalatan/pilocarpine/timolol drops continue same dosing as before.   PERTINENT LABORATORY DATA:  At time of discharge INR is 1.1, hemoglobin  13.5, hematocrit 40.8, BUN 12,  creatinine 1.03.   DISPOSITION:  The patient will be discharged to home with home health.   FOLLOW UP:  The patient is scheduled to follow up with Dr. Thomos Lemons on  Monday December 8 at 3:30 p.m. She will need a follow-up PT/INR at that  time and further evaluation for duration of therapy of Lovenox based on  PT/INR.      Sandford Craze, NP      Gordy Savers, MD  Electronically Signed    MO/MEDQ  D:  05/28/2007  T:  05/28/2007  Job:  [id number]   cc:   Barbette Hair. Artist Pais, DO

## 2010-11-05 NOTE — Assessment & Plan Note (Signed)
[facility]                                 ON-CALL NOTE   NAME:[name], Jennifer Brennan                    MRN:          [id number]  DATE:12/10/2006                            DOB:          October 09, 1923    PRIMARY CARE PHYSICIAN:  Dr. Artist Pais.   The pharmacy called in this evening trying to clarify her current  Synthroid dose. The pharmacist reports that the dose that was called in  today was 75 mcg and previously she was on 88 mcg. I advised the  pharmacist to fill the prescription that was called in today. Pharmacist  agreed.     Leanne Chang, M.D.  Electronically Signed    LA/MedQ  DD: 12/10/2006  DT: 12/11/2006  Job #: [id number]

## 2010-11-05 NOTE — Discharge Summary (Signed)
NAMEBRENT, NOTO NO.:  [id number]   MEDICAL RECORD NO.:  [id number]          PATIENT TYPE:  INP   LOCATION:  5005                         FACILITY:  [facility]   PHYSICIAN:  Claude Manges. Whitfield, M.D.DATE OF BIRTH:  1924/03/31   DATE OF ADMISSION:  11/17/2006  DATE OF DISCHARGE:  11/24/2006                               DISCHARGE SUMMARY   ADMITTING DIAGNOSES:  1. Avascular necrosis arthritis of the right hip.  2. Hypertension.  3. History of cerebrovascular accident.  4. Hypothyroidism.  5. History of deep venous thrombosis.   DISCHARGE DIAGNOSES:  1. Status post right total hip arthroplasty.  2. Acute blood loss anemia, secondary to surgery, requiring blood      transfusion.  3. Rash, unknown etiology, possibly due to dyes in generic      medications, especially suspicious for patient's seizure      medications.  4. Hypotension, possibly secondary to pain medications.  5. History of cerebrovascular accident.  6. History of deep venous thrombosis.  7. Hypertension.   HISTORY OF PRESENT ILLNESS:  Mrs. Jeanbaptiste is an 77 year old African-  American female with right groin pain and leg pain, has had  osteoarthritis of the right hip documented back in 2001 by Dr. Rennis Chris.  The patient was seen by Dr. [name] in 2002 with significant AV in the  right femoral head.  The patient has been treated for OA of the L-spine  with epidurals, without benefit.  The symptoms of right hip pain and leg  pain are due to AVN of the right hip.  The patient was hospitalized on  Nov 17, 2006, to undergo a right total hip arthroplasty.   ALLERGIES:  Penicillin causes itching, intolerance to dyes, particular  dye unknown.   MEDICATIONS:  1. Neurontin 300 mg t.i.d.  2. Detrol LA 400 mg, one daily.  3. Synthroid 88 mcg, one daily.  4. Enalapril 5 mg, one daily.  5. DynaCirc 10 mg, one daily.  6. Cosopt 10 mL, one drop to each eye every 12 hours.  7. Zilactin 0.005%, one drop  in each eye daily.   SURGICAL PROCEDURE:  The patient was taken to the operating room on Nov 17, 2006 by Dr. Norlene Campbell, assisted by Dr. Rinaldo Ratel and  __________  Cordelia Poche.  Patient was placed under general anesthesia and then  underwent a right total hip arthroplasty.  The following components were  used.  DePuy AML 52-mm auto-diameter 100 series acetabular component  with Marathon poly liner, 10-degree posterior lip, apex whole  eliminator, 32-mm outer diameter femoral head with a +1 neck length and  a 13.5-mm small stature femoral component, all prosthetic.  The patient  tolerated the procedure well and returned to recovery in good stable  condition.   CONSULTATIONS:  The following consults were obtained while patient was  hospitalized:  1. PT/OT case management.  2. Rehab.  3. Pharmacy.   HOSPITAL COURSE:  Post-op day 1, patient T-max of 101.1, pulse 55,  respiratory rate 18, blood pressure 139/84, O2 saturation 97% on 3  liters.  H&H was 9.7, 30.  White  count was 7,300.  PT was 15.9, INR 1.2.  Patient's pain under good control, denied any shortness of breath, chest  pain, abdominal pain, calf pain.  X-rays of the right hip showed  excellent position of the right total hip replacement.  Post-op day 2,  patient denied chest pain, shortness of breath, calf pain, nausea,  vomiting.  Temp was 98.3, pulse 77, respiratory rate 18, blood pressure  94/54, O2 saturation is 94% on 2 liters.  White count was 11,000.  H&H  is 8.8, 26.5.  Patient had an episode of drowsiness on Nov 18, 2007 and  had been given 0.4 mg IV.  Narcan PCA was discontinued.  The patient  with a second episode of hypotension, 90/50, asymptomatic, was given a  bolus of 500 cc of Lactated Ringer's on Nov 18, 2006 at 23:10.  At the  time of rounds on the 29th, the patient was asymptomatic, still  hypotensive and therefore was transfused with 2 units of packed red  blood cells.  Next, post-op day 3, patient  without chest pain, shortness  of breath, tolerating diet, no nausea or vomiting.  The patient is  afebrile, blood pressure 97/56, O2 saturations 98% on 2 liters.  White  count remained unchanged to 11,300.  Hemoglobin was 10.7, hematocrit  32.7 status post 2 units of packed red blood cells.  The patient was  alert and oriented.  The patient was not a candidate for inpatient rehab  and was anticipated to be discharged after the weekend to a skilled  nursing facility.  Next, post-op day 4, patient afebrile, vital signs  stable.  H&H is 11.3, 34.7.  PT was 19, INR 1.5.  The patient had  developed a rash on her trunk and legs.  The patient's pain medicine was  changed to Darvocet.  Caladryl was applied to the rash.  Otherwise,  patient awaiting transfer to skilled nursing facility on Monday.  Next,  November 22, 2006, rash was improved, much less itching.  Patient alert and  oriented, patient afebrile.  Vital signs stable.  No new labs.  Awaiting  potential transfer to skilled nursing facility.  Next, post-op day 6,  patient alert and oriented.  Denies any shortness of breath, chest pain,  nausea/vomiting, tolerating diet well.  Pain under control.  The  patient's rash had deteriorated.  The patient noted that she is allergic  to dyes and generic medications at this time, the rash diffuse about the  back, upper and lower extremities.  The patient was also found to be  hypotensive but was asymptomatic.  Blood pressure automatic cuff was  84/47 at time of rounds.  Re-check manually is 98/56.  The patient's T-  max is 99.2.  Blood pressure medicines were held.  H&H remained today at  10.3, 31.1.  White was trended down.  It was 9,100.  The patient found  to have a positive Homan's, pain in calf.  Doppler was ordered, and  later that day, venous Doppler showed no evidence of DVT, no Baker's  cyst.  In regards to patient's rash of unknown etiology, the patient was dispensed back her home meds, to  assure that she was not getting any  generic medications.  The patient had been changed to 2 days prior, to  hypoallergenic sheets.  Later that afternoon, the patient was able to  ambulate to the nurse station with physical therapy.  Post-op date 7,  the patient denied any chest pain, shortness of breath, nausea,  vomiting, tolerating a diet.  Pain under good control.  States rash  improving.  The patient afebrile.  The blood pressure was 90/49,  automatic cuff.  At time of rounds and re-check, manual was 100/50.  The  patient was asymptomatic in regards to her hypotension.  Her H&H  remained stable at 10.8 and 32.3.  White count was elevated at 11,200.  Again, patient was afebrile.  PT was 24.9, INR 2.1.  The patient's skin  rash was resolving, decreased __________  and erythema.  Patient's right  lower extremity had minimal serous drainage.  Calf tenderness was  minimum, negative Homan's.  Foot was neuromotorly, vascularly intact.  Positive EHL and FHL, dorsiflexion and plantar flexion intact.  Otherwise, this patient was stable, ready for discharge by the skilled  facility.   LABORATORY:  Routine labs on admission:  CBC:  White count was 7,100,  hemoglobin 14.0, hematocrit 43.6, platelets 244.  Coags on admission:  PT was 13.6, INR 1.0.  Routine chemistries on admission:  Sodium 141,  potassium 4.5, chloride 107, bicarb 27.  Glucose was elevated slightly  at 102, BUN 18, creatinine 1.0.  Hepatic enzymes on admission:  All  values within normal limits.  Urinalysis on admission was negative.  X-  rays right hip complete showed patient to be status post right total hip  arthroplasty, no complications.  Next, chest x-ray dated Nov 11, 2006  showed no acute cardiopulmonary disease.  EKG:  EKG performed on Nov 11, 2006 showed sinus bradycardia with a heart rate of 57 beats per minute,  p.r.n. interval 158 msec, PRT axis 44, 0, 40.   MEDS ON FLOOR:  The patient was on home meds that were  dispensed through  the pharmacy.  As follows:  1. Detrol LA 4 mg, one daily.  2. Neurontin 300 mg, one t.i.d.  3. DynaCirc CR 10 mg, one daily.  4. Synthroid 88 mcg, one daily.  5. Enalapril 5 mg one daily.  6. Cosopt and Zilactin.  7. Coumadin per pharmacy protocol.  On November 23, 2006, patient received      7.5 mg of Coumadin once daily.  8. Heparin 500 units subcu q.12 h. until therapeutic.  9. Tylenol 650 mg, one p.o. q.4 h. p.r.n.  10.Reglan 10 mg, one p.o. q.8 h. p.r.n.  11.Calamine lotion, apply topically to rash p.r.n.  12.Darvocet N-100, one tab p.o. q.4 h. p.r.n., half a tablet for mild      to moderate pain.  13.Benadryl 12.5 mg, one p.o. q.6 h. p.r.n.   DIET:  Regular diet, advance as tolerated.   WEIGHTBEARING STATUS:  Patient is weightbearing as tolerated with a  walker.   WOUND CARE:  1. Keep wound clean and dry, change dressing daily.  May shower after      two days, __________  wound site. 2. Staples to be removed 14 days post-op.   CONDITION ON DISCHARGE:  Patient was discharged to skilled nursing  facility in good stable condition.   MEDICATIONS:  1. Floor meds should be adjusted by skilled nursing facility      physician.  2. Would recommend patient's home meds to be dispensed, as she has      improved since being placed on her home meds.   SPECIAL INSTRUCTIONS:  Knee immobilizer, right leg.  When in bed, to be  applied loosely.  Keep heels elevated off bed.   FOLLOWUP:  Patient needs followup with Dr. [name] 14 days from  surgery,  approximately 7 days from discharge from hospital.  Please call  [phone] for appointment.  Staples to be removed at that time, and x-  rays will be obtained of the hip.      Richardean Canal, P.A.      Claude Manges. [name], M.D.  Electronically Signed    GC/MEDQ  D:  11/24/2006  T:  11/24/2006  Job:  [id number]   cc:   Claude Manges. [name], M.D.

## 2010-11-05 NOTE — Consult Note (Signed)
VASCULAR SURGERY CONSULTATION   NUR, RABOLD  DOB:  10/01/1923                                       07/06/2007  KGMWN#:[id number]   HISTORY:  I saw the patient in the office today in consultation  concerning her right leg pain related to her right lower extremity DVT.  She was referred by Dr. Artist Pais.  This is a pleasant 77 year old woman who  underwent a right hip replacement in May of 2008.  She had been having  some pain in her right leg before and after her surgery and ultimately  in early December had a venous duplex scan which showed an extensive DVT  involving the posterior tibial vein, popliteal vein, femoral vein and  superficial and common femoral vein.  Of note, a subsequent followup  study on June 16, 2007, showed some resolution of her clot.  She has  been on Coumadin since her initial diagnosis.  She has continued to have  some right leg pain which has been fairly persistent.  She has also had  problems with right leg swelling.  She was referred for vascular  consultation.   Of note, she states that she has had previous blood clots but could not  remember which leg or the exact dates.  She has had no history of  phlebitis that she is aware of.   PAST MEDICAL HISTORY:  Significant for hypertension.  She denies any  history of diabetes, hypercholesterolemia, history of previous  myocardial infarction or history of congestive heart failure.   FAMILY HISTORY:  There is no history of premature cardiovascular  disease.  She is unaware of any history of clotting disorders in her  family although she does have a daughter who is on Coumadin for a blood  clot.   SOCIAL HISTORY:  She is married.  She has 4 children.  She quit tobacco  in 1981.   MEDICATIONS:  Include Coumadin and the remaining medications are  documented on the medical history form in her chart.   REVIEW OF SYSTEMS:  Documented on the medical history form in her chart.   PHYSICAL  EXAMINATION:  General:  This is a pleasant 77 year old woman  who appears her stated age.  Vital signs:  Her blood pressure is 187/89,  heart rate is 68.  HEENT:  There is no cervical lymphadenopathy.  Extraocular motions are intact.  I do not detect any carotid bruits.  Lungs:  Clear bilaterally to auscultation.  Cardiac:  She has a regular  rate and rhythm.  Abdomen:  Soft and nontender.  I cannot palpate an  aneurysm.  She has normal pitched bowel sounds.  She has normal femoral  pulses.  She has palpable dorsalis pedis pulses bilaterally.  She has  moderate bilateral lower extremity swelling more significant on the  right side.  She has hyperpigmentation bilaterally consistent with  chronic venous insufficiency.  She has no open ulcers.  Neurological:  Nonfocal.   Based on her exam and duplex study she has DVT of the right lower  extremity with partial recanalization.  She is on Coumadin and given her  history of DVT in the past it might be best to keep her on this  indefinitely.  I have stressed with her the importance of intermittent  leg elevation and also have written a prescription for knee high  compression stockings with a 10-15 mmHg pressure gradient.  I have  reassured her that she has excellent arterial flow and that I do not  think any of her pain could be related to arterial insufficiency.  I  think elevating her legs will probably help with the pain as much as  anything.  However, she may have a component of arthritic pain in her  feet.  I explained to her that there was no indication for surgical  thrombectomy and this often results in further clotting.  Likewise she  is not an ideal candidate for thrombolytic therapy.  I would consider a  followup duplex scan in 6 months to look for further recanalization.  I  would be happy to see her back at any time.  She understands this will  be a chronic problem and she will likely need to continue wearing  compressive stockings  and continue to use intermittent leg elevation for  relief of symptoms.   Di Kindle. Edilia Bo, M.D.  Electronically Signed  CSD/MEDQ  D:  07/06/2007  T:  07/07/2007  Job:  [id number]   cc:   Barbette Hair. Artist Pais, DO

## 2010-11-05 NOTE — Op Note (Signed)
[name], [name]:  [id number]   MEDICAL RECORD NO.:  [id number]          PATIENT TYPE:  INP   LOCATION:  2899                         FACILITY:  [facility]   PHYSICIAN:  Claude Manges. Whitfield, M.D.DATE OF BIRTH:  07/26/1923   DATE OF PROCEDURE:  11/17/2006  DATE OF DISCHARGE:                               OPERATIVE REPORT   PREOPERATIVE DIAGNOSIS:  Osteoarthritis, right hip.   POSTOPERATIVE DIAGNOSIS:  Osteoarthritis, right hip.   PROCEDURE:  Right total hip replacement.   SURGEON:  Dr. [name].   ASSISTANT:  Dr. Chaney Malling and Jacqualine Code, P.A.-C.   ANESTHESIA:  General orotracheal.   COMPLICATIONS:  None.   ESTIMATED BLOOD LOSS:  200 mL.   COMPONENTS:  DePuy AML 52-mm auto diameter 100 series acetabular  component with a Marathon polyethylene liner 10-degree posterior lip  apex hole eliminator 32-mm auto diameter femoral head with a +1 neck  length and a 13.5-mm small stature femoral component; all were press  fit.   PROCEDURE:  With the patient comfortable on the operating table and  under general orotracheal anesthesia, nursing staff inserted a Foley  catheter.  The patient was then carefully placed in the lateral  decubitus position with the right side up and secured with the Innomed  hip system.   The right hip was then prepped with Betadine scrub and then DuraPrep  from iliac crest to the mid calf.  Sterile draping was performed.   A routine southern incision was utilized and via sharp dissection  carried down to subcutaneous tissue.  There was abundant adipose tissue  of at least 3 inches.  This was carefully incised with the Bovie with  good hemostasis.  Self-retaining retractors were inserted.  Iliotibial  band was identified and incised along the length of the skin incision.  Tendinous structures were tagged with a 0 Ethilon suture.   The capsule was identified and incised on the femoral neck and head.  There was very minimal  effusion but moderate synovitis.  Head was then  dislocated posteriorly.  The head was osteotomized using the calcar  guide.  The head was misshapen and devoid of articular cartilage.   The piriformis fossa was identified.  It was cleared of soft tissue.  A  starter hole was then made.  The canal finder was then inserted, and  reaming was performed to 13 mm to accept a 13.5-mm component.  I used a  calcar cutter to obtain the appropriate angle in the calcar which was  about a fingerbreadth above the lesser trochanter.   Retractor was then placed about the acetabulum.  Extraneous labrum was  excised.  Any loose bone was also removed.  The reaming was then  performed sequentially from 43-51 mm with excellent bleeding bone.  We  trialed both a 50 and 52 component, felt that the 52 was a perfect fit.  Accordingly the 100 series AML metallic acetabular component was then  impacted into the acetabulum with just a perfect fit.  We then trialed  the polyethylene component with a 10-degree posterior lip.  The trial  femur was then inserted.  We used a 32-mm hip ball with a +1 neck  length, and after our reduction, we had excellent range of motion and no  instability, and I felt the leg lengths were symmetrical.  The patient  was about a 3/4 to 3/8 of an inch short preoperatively.   The trial components were then removed.  Joint was copiously irrigated  with saline solution.  The apex hole eliminator was inserted followed by  the final impacted Marathon polyethylene liner.  The wound was again  irrigated with saline solution.  The femoral canal was then filled with  the femoral component with a nice flush fit on the calcar.  We carefully  irrigated the wound again with saline solution.  The femoral neck was  then cleaned and dried, and the final third 32-mm hip ball with a +1 mm  neck length was then impacted.  We inspected the acetabulum without  evidence of loose material.  The hip was then  reduced, and again through  a full range of motion, there was no instability.   The wound was again irrigated with saline solution.  The capsule was  closed anatomically with #1 Ethibond.  Short external rotators were  closed with a similar material.  Wound was again irrigated.  Iliotibial  band was closed with a running 0 Vicryl, the subcu closed in several  layers with 0-0 and 2-0 Vicryl, skin closed with skin clips.  Sterile  bulky dressing was applied followed by sterile gauze and tape.   The patient tolerated the procedure well without complications.  Returned to the post anesthesia recovery room without complications.      Claude Manges. [name], M.D.  Electronically Signed     PWW/MEDQ  D:  11/17/2006  T:  11/17/2006  Job:  [id number]

## 2010-11-06 ENCOUNTER — Other Ambulatory Visit: Payer: Medicare Other

## 2010-11-06 ENCOUNTER — Other Ambulatory Visit (INDEPENDENT_AMBULATORY_CARE_PROVIDER_SITE_OTHER): Payer: Medicare Other

## 2010-11-06 DIAGNOSIS — E039 Hypothyroidism, unspecified: Secondary | ICD-10-CM

## 2010-11-06 LAB — TSH: TSH: 1.73 u[IU]/mL (ref 0.35–5.50)

## 2010-11-07 ENCOUNTER — Other Ambulatory Visit: Payer: Self-pay | Admitting: Surgical Oncology

## 2010-11-07 ENCOUNTER — Ambulatory Visit (HOSPITAL_COMMUNITY): Payer: Medicare Other | Attending: Surgical Oncology

## 2010-11-07 DIAGNOSIS — M810 Age-related osteoporosis without current pathological fracture: Secondary | ICD-10-CM | POA: Insufficient documentation

## 2010-11-07 LAB — CREATININE, SERUM
Creatinine, Ser: 1.11 mg/dL (ref 0.4–1.2)
GFR calc non Af Amer: 47 mL/min — ABNORMAL LOW (ref >60)

## 2010-11-08 NOTE — Discharge Summary (Signed)
[facility]  Patient:    Jennifer Brennan, Jennifer Brennan Visit Number: [id number] MRN: [id number]          Service Type: MED Location: [phone] Attending Physician:  Virgia Land Dictated by:   Lindell Spar. Chestine Spore, M.D. Admit Date:  12/27/2000 Discharge Date: 01/02/2001                             Discharge Summary  DISCHARGE DIAGNOSES: 1. Vasovagal syncopal attack. 2. Allergic skin reaction to cephalosporin antibiotics. 3. 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 4. Primary hypothyroidism. 5. 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 6. History of systemic hypertension. 7. Hypercholesterolemia. 8. History of Stevens-Johnson syndrome related to Dilantin therapy in the    past.  REASON FOR ADMISSION:  [name] is a 77 year old African-American woman who had been seen in the urgent care center several days prior to admission for symptoms suggestive of a urinary tract infection.  The patient was apparently started on a cephalosporin antibiotic to which she developed a skin rash and a fair amount of itching.  In response to these symptoms, hydroxyzine apparently was called in for symptomatic relief and, according to the daughter, immediately after taking hydroxyzine, the patient transiently stopped breathing, fell back and became unresponsive, but there was no obvious evidence of tonic clonic movements, incontinence or other symptoms suggestive of a seizure.  The patient was brought over to [facility] [employer] to the emergency room where she was evaluated and subsequently admitted for further treatment and observation.  PERTINENT PHYSICAL FINDINGS:  GENERAL APPEARANCE:  She is a well-developed, well-nourished African-American woman appearing younger than her stated age of 75.  VITAL SIGNS:  Her blood pressure was 110/65 with a respiratory rate of 20  and unlabored, pulse rate of 66, temperature 97.6.  HEENT:  Remarkable for the absence of any signs of head trauma.  Pupils 3 mm round and reactive to light.  NECK:  Supple with no adenopathy.  CHEST:  No splinting or tenderness.  LUNGS:  Clear.  CARDIOVASCULAR:  She had a regular rhythm with no murmurs, rubs, or gallops.  ABDOMEN:  Nondistended, soft, no organomegaly.  EXTREMITIES:  She did have a healing wound on the great toe of the right foot secondary to removal of the toenail.  SKIN:  She had erythematous macular rash.  NEUROLOGICAL:  She was lethargic but oriented to name, place and year.  Speech is fluent.  She moved all extremities equally well.  No gross sensory or reflex deficit and her cerebellar function was intact.  PERTINENT LAB AND X-RAY DATA:  On admission, her EKG revealed a normal sinus rhythm, no deviation of electrical axis, no acute STT wave changes.  Her admission pH was 7.37 with a bicarb of 26. CBC is normal with the exception of a mild reduction in MCV, platelet count of 214,000, hematocrit 39.8, white count normal at 8800; she did have a slight shift to the left. Her comprehensive metabolic panel was normal with the exception of a nonfasting elevation blood glucose of 160, albumin low at 2.9.  Her cholesterol is elevated 243 with LDL of 171.  Her TSH is low at 0.224.  A.M. Cortisol of 5.6.  HOSPITAL COURSE:  The patient was admitted to a monitored telemetry bed with a working diagnosis of syncope of uncertain etiology.  It was felt  that the patient did have evidence of ongoing allergic dermatitis presumably related to cephalosporin antibiotic she had been given.  She was treated symptomatology with Atarax and Benadryl for her itching; however, these agents only produced more lethargy.  Her EKG failed to reveal any significant tachycardia or bradycardia that would account for her syncope.  On the second hospital day it was noted that the patient  complained of some pain in the right distal leg and it was noted that she had some swelling along with tenderness.  She has a previous history of a deep venous thrombosis in that right leg and repeat venous Doppler was performed.  This did confirm a new venous clot in the right distal leg.  The patient was started on Lovenox therapy using the standard protocol by pharmacy along with Coumadin therapy.  When her INR reached a therapeutic range of 2.2, her Lovenox was discontinued.  She was treated symptomatically with warm compresses and bedrest.  She was initially treated with Zyrtec for her allergic dermatitis and pruritus. This, however, did produce a fair amount of lethargy and it was then subsequently discontinued.  The patient, in addition to her DVT, she did complain of some swelling and pain in the right great toe and, as noted above, did have evidence of paronychia involving that medial aspect of the right great toe.  She was started on warm saline soaks along with Bactroban ointment, dressing, and Ciprofloxacin was the antibiotic chosen at 500 mg b.i.d.  There were no further complications or problems noted.  The patient was adequately coumadinized.  Her Lovenox was discontinued and she was subsequently discharged home on 4 mg of Coumadin per day along with ciprofloxacin 500 mg b.i.d. for her paronychia.  She was instructed to continue her Neurontin, timolol, _________ eye drops, as she had been taking prior to admission.  Her Synthroid, Detrol were likewise continued.  Coreg was substituted for her propranolol at a dose of 6.25 mg b.i.d.  Tenex 2 mg at bedtime was also part of her blood pressure regimen.  The patient was scheduled to be seen by the home health nurse for follow-up of her paronychia as well as to draw subsequent INRs to monitor Coumadin dose. She is scheduled to be seen in my office in two weeks.  CONDITION ON DISCHARGE:  Significantly improved.  DISCHARGE DIET:   She is discharged on a no added salt, modified fat restricted diet.Dictated by:   Lindell Spar. Chestine Spore, M.D.  Attending Physician:  Virgia Land DD:  02/25/01 TD:  02/25/01 Job: [id number]

## 2010-11-08 NOTE — Discharge Summary (Signed)
[facility]  Patient:    Jennifer Brennan Visit Number: [id number] MRN: [id number]          Service Type: MED Location: [phone] Attending Physician:  Virgia Land Admit Date:  06/25/1999 Discharge Date: 06/28/1999                             Discharge Summary  DISCHARGE DIAGNOSES:  1. Deep vein thrombosis and phlebitis of the right lower extremity.  2. History of seizure disorder.  3. Past history of anterior cerebral hemorrhage.  4. System hypertension.  5. Primary hypothyroidism.  REASON FOR ADMISSION:  Jennifer Brennan is a 77 year old, hypertensive woman who presented to the office on the day of admission with the complaint of pain in the right calf and posterior knee for three days duration.  Because of the progression of the intensity of the pain and decreasing mobility, she came into the office or evaluation.  The patient was seen in the office where she was noted to have both positive Homans sign and tenderness of the right calf and was referred for venous Doppler which confirmed a clinical suspicion of deep vein thrombosis of the right leg.  The patient was subsequently referred for systemic anticoagulation.  PERTINENT PHYSICAL FINDINGS:  GENERAL:  She is an elderly African-American woman appearing younger than her stated age of 63.  She is without any obvious respiratory distress but did appear to be quite anxious.  VITAL SIGNS:  Her blood pressure was 150/90, pulse rate 96, respiratory rate 20 and unlabored.  Temperature was 99.  HEENT:  Unremarkable.  NECK:  Supple.  No adenopathy.  No thyromegaly.  No jugular venous distention.  CHEST:  No splinting, tenderness, or deformity.  Her lungs were clear to percussion and auscultation.  CARDIAC:  Her rhythm was regular.  She had a normal S1 and S2.  No gallops or rubs noted.  ABDOMEN:  Soft and nontender.  No organomegaly.  No masses.  EXTREMITIES:  She  had tenderness of the right calf without a palpable cord. She did have a positive Homans sign, however.  There was no clubbing, cyanosis or edema.  BACK:  There was no spinal or paraspinal tenderness.  NEUROLOGIC:  She was alert, oriented and cooperative, and no gross sensory or motor deficits.  PERTINENT LABORATORY AND X-RAY DATA:  Her comprehensive metabolic panel was normal with the exception of a glucose.  A random glucose was elevated at 139. Albumin is low at 2.8.  Her white count is 10,000, hematocrit 42.  Urinalysis negative for glucose, hemoglobin, routine nitrites, or leukocyte esterase.  Chest x-ray was completely normal with clear lung fields.  No pleural effusions and normal heart size.  Her venous Doppler revealed evidence of DVT from the distal  calf to the popliteal vein.  Her EKG revealed a normal sinus rhythm, nonspecific repolarization abnormalities.  No deviation of the electrical axis.  HOSPITAL COURSE:  Jennifer Brennan was admitted to this hospital and started on a continuous infusion of heparin, with the dose being determined using the standard protocol established by the pharmacy department her at [facility]. The patient was treated with a continuous infusion of heparin with PTTs in the 200 range.  She was noted to be quite sensitive to the heparin, which unfortunately was not explained by heparin levels.  It was decided that a more appropriate way of  anticoagulating her, as well  as shortening her hospital stay was the use of Lovenox.  She was started on Lovenox at 70 mg subcu every 12 hours and given her initial dose of Coumadin 5 mg daily.  Arrangements were made with the Home Health nurse to come in on a daily basis to draw a protime, PTT, and to give the patient her Lovenox based on her PTT determinations.  Her hospital course was unremarkable. At no point in time did the patient develop any symptoms or physical findings suggesting a  pulmonary embolization.  At the time of her discharge she did have  some persistent discomfort in the right calf, however, this was much improved after having used moist compresses and bed rest during the hospital stay.  DISCHARGE MEDICATIONS:  1. Synthroid 0.125 mg per day.  2. Depakote 250 mg b.i.d.  3. Inderal or propranolol LA 80 mg daily.  4. Coumadin and Lovenox with their dosages being determined on a daily basis based     on the protime and PTT.  DIET:  She is advised to continue her no salt added diet.  FOLLOWUP:  She will be seen in my office in one week, but she will be seen by the visiting nurse on a daily basis.  PROGNOSIS:  Fair. Attending Physician:  Virgia Land DD:  07/24/99 TD:  07/25/99 Job: [id number]

## 2010-11-08 NOTE — Letter (Signed)
September 14, 2006    Claude Manges. [name], M.D.  [facility]  [address] [place], [place] [address]   RE:  [name], [name]  MRN:  [id number]  /  DOB:  20-Feb-1924   Dear Dr. [name],   This letter is in regards to surgical clearance for patient Jennifer Brennan.  She is an 77 year old African-American female with severe  degenerative changes of her right hip and also her lumbar spine.  She  has failed conservative therapy and also pain management.  She has a  past medical history of hypertension and is known to have suffered a  hemorrhagic stroke in 1985.  This was complicated by seizures.  However,  the patient has been well controlled on DynaCirc and Neurontin since  that time.  She is followed by Dr. Alvester Morin, Neurologist in [place].  The patient also has a history of deep venous thrombosis in the right  lower leg and hypothyroidism.   Her current medications are Neurontin, 300 mg three times a day,  DynaCirc 10 mg once a day, Detrol LA 4 mg in the morning, Synthroid 88  mcg once a day.   Allergies to medications include PENICILLIN and MORPHINE, which causes  pruritus.   She does not have a history of coronary artery disease or aortic  stenosis.  She has not experienced any chest pain symptoms within the  last six months.  She is a non-smoker and does not have a history of any  chronic lung disease.   PHYSICAL EXAMINATION:  VITAL SIGNS:  Her weight is 141 pounds.  Temperature 97.4.  Pulse is 68. Blood pressure is 135/70 in the left arm  in a seated position.  GENERAL APPEARANCE:  The patient is a frail 77 year old African-American  female in no apparent distress.  HEENT:  Normocephalic, atraumatic.  Pupils were equal and reactive to  light bilaterally.  Extraocular motility was intact.  Patient was  anicteric.  Conjunctivae within normal limits.  NECK:  Supple without any evidence of carotid bruit, adenopathy or  thyromegaly.  CHEST:   Normal respiratory effort.  Chest was clear to auscultation  bilaterally.  No rhonchi, rales or wheezing.  CARDIOVASCULAR:  Regular rate and rhythm with no significant murmurs,  rubs or gallops.  ABDOMEN:  Soft, nontender.  Positive bowel sounds, no organomegaly.  EXTREMITIES:  She had positive edema of her lower extremities up to mid  lower leg.  NEUROLOGICAL:  Cranial nerves II-XII grossly intact.  She is nonfocal.   CLINICAL DATA:  An EKG was performed in the office which showed normal  sinus rhythm at 60 beats per minute.   In summary, the patient is an 77 year old African-American female with  past medical history of hypertension, hypothyroidism and remote history  of deep venous thrombosis.  I believe her cardiac risk for the surgery  is low; however, I did recommend a preoperative adenosine Myoview stress  testing and I will forward the results to your office.   She is to continue her blood pressure medication and gabapentin  perioperatively.   If you have any further questions or concerns, feel free to contact me  at my office at [phone].    Sincerely,      Barbette Hair. Artist Pais, DO  Electronically Signed    RDY/MedQ  DD: 09/15/2006  DT: 09/15/2006  Job #: [id number]

## 2010-11-08 NOTE — Assessment & Plan Note (Signed)
[facility]                                 ON-CALL NOTE   NAME:Jennifer Brennan, Jennifer Brennan                      MRN:          [id number]  DATE:11/27/2006                            DOB:          21-Apr-1924    TIME OF CALL:  6:25 p.m.   TELEPHONE NUMBER:  [id number].   Caller is Torrie Mayers, her daughter.  Regular doctor is Dr. Artist Pais, I  am Dr. Milinda Antis on-call.   CHIEF COMPLAINT:  Low blood pressure.  [name] says that [name] is a  patient of Dr. Olegario Messier.  Last week she had a total hip replacement, she  was admitted to [facility] to do rehab.  She said her blood pressure has been in the 70s systolic to 40s over 50s  diastolic for several days.  They keep putting her in bed and elevating  her legs for it.  She thinks she is on 2 blood pressure medications  which they held in the hospital but restarted in the nursing home.  She  said she has called many times and can not get anyone to change the  orders and she is very worried about her.  She says that [name] has been  fatigued but does not know of any other symptoms and she wanted me to  call [facility].  I then called [facility] at [phone] and  spoke to the nurse Archie Patten, she said [name] is on lisinopril 5 mg daily  and this is the only medicine she is on for blood pressure.  I told them  to hold it tonight and that she told me her blood pressure was in the  90s systolic over ? diastolic, she actually could not tell me what the  diastolic was.  I told them to hold her lisinopril and please call me  back at [phone] if her blood pressure gets any lower or she develops  any symptoms of dizziness, headache, or any other symptoms at all.  I  then called her daughter Torrie Mayers back to tell her what we did  and told her that she needs to call me if there are any changes and they  need to try to reach Dr. Artist Pais on Monday for further instructions.     Marne A. Tower, MD  Electronically Signed    MAT/MedQ  DD: 11/27/2006  DT: 11/28/2006  Job #: [id number]   cc:   Barbette Hair. Artist Pais, DO

## 2010-11-13 ENCOUNTER — Encounter (HOSPITAL_COMMUNITY): Payer: Medicare Other | Attending: Internal Medicine

## 2010-11-13 DIAGNOSIS — M810 Age-related osteoporosis without current pathological fracture: Secondary | ICD-10-CM | POA: Insufficient documentation

## 2010-11-14 ENCOUNTER — Encounter (INDEPENDENT_AMBULATORY_CARE_PROVIDER_SITE_OTHER): Payer: Self-pay | Admitting: Surgery

## 2010-11-14 ENCOUNTER — Ambulatory Visit (INDEPENDENT_AMBULATORY_CARE_PROVIDER_SITE_OTHER): Payer: Medicare Other | Admitting: *Deleted

## 2010-11-14 DIAGNOSIS — I639 Cerebral infarction, unspecified: Secondary | ICD-10-CM

## 2010-11-14 DIAGNOSIS — I82409 Acute embolism and thrombosis of unspecified deep veins of unspecified lower extremity: Secondary | ICD-10-CM

## 2010-11-14 DIAGNOSIS — M1990 Unspecified osteoarthritis, unspecified site: Secondary | ICD-10-CM

## 2010-11-14 DIAGNOSIS — Z8679 Personal history of other diseases of the circulatory system: Secondary | ICD-10-CM

## 2010-11-14 LAB — POCT INR: INR: 2.9

## 2010-12-01 ENCOUNTER — Other Ambulatory Visit: Payer: Self-pay | Admitting: Internal Medicine

## 2010-12-02 NOTE — Telephone Encounter (Signed)
Rx refill sent to pharmacy. 

## 2010-12-12 ENCOUNTER — Encounter: Payer: Medicare Other | Admitting: *Deleted

## 2010-12-16 ENCOUNTER — Ambulatory Visit (INDEPENDENT_AMBULATORY_CARE_PROVIDER_SITE_OTHER): Payer: Medicare Other | Admitting: *Deleted

## 2010-12-16 DIAGNOSIS — I82409 Acute embolism and thrombosis of unspecified deep veins of unspecified lower extremity: Secondary | ICD-10-CM

## 2010-12-16 DIAGNOSIS — Z8679 Personal history of other diseases of the circulatory system: Secondary | ICD-10-CM

## 2010-12-22 LAB — HM DIABETES EYE EXAM

## 2010-12-28 ENCOUNTER — Other Ambulatory Visit: Payer: Self-pay | Admitting: Internal Medicine

## 2011-01-06 ENCOUNTER — Encounter: Payer: Self-pay | Admitting: Family

## 2011-01-06 ENCOUNTER — Ambulatory Visit (INDEPENDENT_AMBULATORY_CARE_PROVIDER_SITE_OTHER): Payer: Medicare Other | Admitting: Family

## 2011-01-06 DIAGNOSIS — L0390 Cellulitis, unspecified: Secondary | ICD-10-CM

## 2011-01-06 DIAGNOSIS — L03119 Cellulitis of unspecified part of limb: Secondary | ICD-10-CM

## 2011-01-06 MED ORDER — DOXYCYCLINE HYCLATE 50 MG PO CAPS: 100.0000 mg | ORAL_CAPSULE | Freq: Two times a day (BID) | ORAL | Status: DC

## 2011-01-06 NOTE — Assessment & Plan Note (Signed)
77 year old female with mild cellulitis of right shin. Will treat with doxycycline. She is on Coumadin therapy and is scheduled for a Coumadin clinic appointment in one week.

## 2011-01-06 NOTE — Progress Notes (Signed)
Subjective:    Patient ID: Jennifer Brennan, female    DOB: 15-May-1924, 77 y.o.   MRN: [id number]  HPI   Jennifer Brennan is an 77 year old female who presents today with her 2 daughters.Chief complaint today is right leg pain with associated blistering on her right shin. Daughter reports that she first noticed this yesterday, and has been applying bacitracin ointment to the blisters. They deny any associated fever. Daughter notes that it actually looks slightly improved today, as yesterday there were more blisters none today.Patient has been complaining of some associated right leg pain.   Review of Systems See history of present illness  Past Medical History  Diagnosis Date  . Hypertension   . Hypothyroidism   . Seizure disorder   . History of cerebrovascular accident   . History of DVT (deep vein thrombosis)     right  . Encounter for long-term (current) use of other medications     coumdain therapy  . Severe stage glaucoma   . Depression with anxiety   . OAB (overactive bladder)     History   Social History  . Marital Status: Married    Spouse Name: N/A    Number of Children: N/A  . Years of Education: N/A   Occupational History  . Not on file.   Social History Main Topics  . Smoking status: Never Smoker   . Smokeless tobacco: Not on file  . Alcohol Use: No  . Drug Use: Not on file  . Sexually Active: Not on file   Other Topics Concern  . Not on file   Social History Narrative   Retired Married     Never Smoked     Alcohol use-no               Past Surgical History  Procedure Date  . Total abdominal hysterectomy   . Total hip arthroplasty     right hip  . Refractive surgery     B/L    No family history on file.  Allergies  Allergen Reactions  . Morphine Sulfate     REACTION: rash: ITCHING  . Penicillins     REACTION: urticaria (hives)  . Sertraline Hcl     REACTION: rash    Current Outpatient Prescriptions on File Prior to Visit  Medication Sig  Dispense Refill  . brimonidine (ALPHAGAN) 0.15 % ophthalmic solution Place 1 drop into the left eye every morning.        . Cholecalciferol 1000 UNITS tablet Take 1,000 Units by mouth daily.        Marland Kitchen DIOVAN 80 MG tablet take 1 tablet by mouth once daily  30 tablet  2  . docusate sodium (COLACE) 100 MG capsule Take 100 mg by mouth daily.        . dorzolamide-timolol (COSOPT) 22.3-6.8 MG/ML ophthalmic solution Place 1 drop into both eyes 2 (two) times daily.        Marland Kitchen glucose blood (FREESTYLE LITE) test strip Use to check blood sugar once a day       . latanoprost (XALATAN) 0.005 % ophthalmic solution Place 1 drop into both eyes at bedtime.        Marland Kitchen NIFEdipine (PROCARDIA-XL) 30 MG (OSM) 24 hr tablet Take 30 mg by mouth daily.        . pilocarpine (PILOCAR) 1 % ophthalmic solution 1 drop 4 (four) times daily.        Marland Kitchen SYNTHROID 100 MCG tablet take 1 tablet by mouth  once daily  30 tablet  3  . VESICARE 5 MG tablet take 1 tablet by mouth once daily  30 tablet  3  . warfarin (COUMADIN) 5 MG tablet take as directed BY COUMADIN CLINIC  40 tablet  3    BP 100/60  Pulse 66  Temp(Src) 98 F (36.7 C) (Oral)  Resp 16  Ht 5' (1.524 m)  Wt 154 lb 1.3 oz (69.89 kg)  BMI 30.09 kg/m2       Objective:   Physical Exam  Constitutional: She appears well-developed and well-nourished.  Cardiovascular: Normal rate and regular rhythm.   Pulmonary/Chest: Effort normal and breath sounds normal.  Musculoskeletal:       Hyperpigmentation of right shin noted consistent with chronic venous stasis. She is noted to have a few vesicular blisters filled with clear fluid on the right shin. She is noted to have a slight edema of the right lower extremity as compared to the left. There is no significant warmth or erythema noted. 1+ to 2+ bilateral dorsalis pedis and posterior tibial pulses bilaterally          Assessment & Plan:

## 2011-01-06 NOTE — Patient Instructions (Addendum)
Please start antibiotics today. Call if you develop increased pain, swelling, blistering,redness of leg or if you develop fever. Check blood pressure once daily.  Do not take your blood pressure medication (Diovan) if your pressure is less than 110/80. Follow up in 1 week.

## 2011-01-13 ENCOUNTER — Ambulatory Visit (INDEPENDENT_AMBULATORY_CARE_PROVIDER_SITE_OTHER): Payer: Medicare Other | Admitting: Family

## 2011-01-13 ENCOUNTER — Telehealth: Payer: Self-pay | Admitting: Family

## 2011-01-13 ENCOUNTER — Ambulatory Visit (HOSPITAL_BASED_OUTPATIENT_CLINIC_OR_DEPARTMENT_OTHER)
Admission: RE | Admit: 2011-01-13 | Discharge: 2011-01-13 | Disposition: A | Discharge status: Home / Self Care | Payer: Medicare Other | Source: Ambulatory Visit | Attending: Family | Admitting: Family

## 2011-01-13 ENCOUNTER — Ambulatory Visit (INDEPENDENT_AMBULATORY_CARE_PROVIDER_SITE_OTHER): Payer: Medicare Other | Admitting: *Deleted

## 2011-01-13 ENCOUNTER — Encounter: Payer: Self-pay | Admitting: Family

## 2011-01-13 VITALS — BP 120/76 | HR 66 | Temp 97.7°F | Resp 16 | Ht 60.0 in

## 2011-01-13 DIAGNOSIS — L0291 Cutaneous abscess, unspecified: Secondary | ICD-10-CM

## 2011-01-13 DIAGNOSIS — L0390 Cellulitis, unspecified: Secondary | ICD-10-CM

## 2011-01-13 DIAGNOSIS — M5431 Sciatica, right side: Secondary | ICD-10-CM | POA: Insufficient documentation

## 2011-01-13 DIAGNOSIS — I82409 Acute embolism and thrombosis of unspecified deep veins of unspecified lower extremity: Secondary | ICD-10-CM

## 2011-01-13 DIAGNOSIS — Z8679 Personal history of other diseases of the circulatory system: Secondary | ICD-10-CM

## 2011-01-13 DIAGNOSIS — L02419 Cutaneous abscess of limb, unspecified: Secondary | ICD-10-CM | POA: Insufficient documentation

## 2011-01-13 DIAGNOSIS — L03119 Cellulitis of unspecified part of limb: Secondary | ICD-10-CM | POA: Insufficient documentation

## 2011-01-13 DIAGNOSIS — M5430 Sciatica, unspecified side: Secondary | ICD-10-CM

## 2011-01-13 MED ORDER — DOXYCYCLINE HYCLATE 100 MG PO CAPS: 100.0000 mg | ORAL_CAPSULE | Freq: Two times a day (BID) | ORAL | Status: DC

## 2011-01-13 NOTE — Assessment & Plan Note (Addendum)
Case was reviewed with Dr. Rodena Medin and examined the patient with me. He recommended a limited ultrasound of the area of induration to exclude abscess. We will order that today along with a CBC, and continue patient's doxycycline. She is scheduled this afternoon to be evaluated in the Coumadin clinic for PT/INR monitoring. I stressed the importance of keeping this appointment, she has been on recent antibiotics.

## 2011-01-13 NOTE — Progress Notes (Signed)
Subjective:    Patient ID: Jennifer Brennan, female    DOB: September 15, 1923, 77 y.o.   MRN: [id number]  HPI  Jennifer Brennan is an 77 year old female who presents today for followup her right lower extremity cellulitis. Last visit she was noted to have some chronic venous stasis changes in the right lower extremity with some associated blistering. She was placed on doxycycline 100 mg twice daily. Daughter reports she completed this medication and not the blisters have resolved. She continues to complain of some pain on the right shin. Family denies any recent fever. The patient does complain of some pain radiating down the back of her right leg and into her right foot. She is currently wheelchair-bound    Review of Systems See history present illness  Past Medical History  Diagnosis Date  . Hypertension   . Hypothyroidism   . Seizure disorder   . History of cerebrovascular accident   . History of DVT (deep vein thrombosis)     right  . Encounter for long-term (current) use of other medications     coumdain therapy  . Severe stage glaucoma   . Depression with anxiety   . OAB (overactive bladder)     History   Social History  . Marital Status: Married    Spouse Name: N/A    Number of Children: N/A  . Years of Education: N/A   Occupational History  . Not on file.   Social History Main Topics  . Smoking status: Never Smoker   . Smokeless tobacco: Not on file  . Alcohol Use: No  . Drug Use: Not on file  . Sexually Active: Not on file   Other Topics Concern  . Not on file   Social History Narrative   Retired Married     Never Smoked     Alcohol use-no               Past Surgical History  Procedure Date  . Total abdominal hysterectomy   . Total hip arthroplasty     right hip  . Refractive surgery     B/L    No family history on file.  Allergies  Allergen Reactions  . Morphine Sulfate     REACTION: rash: ITCHING  . Penicillins     REACTION: urticaria (hives)  .  Sertraline Hcl     REACTION: rash    Current Outpatient Prescriptions on File Prior to Visit  Medication Sig Dispense Refill  . brimonidine (ALPHAGAN) 0.15 % ophthalmic solution Place 1 drop into the left eye every morning.        . Cholecalciferol 1000 UNITS tablet Take 1,000 Units by mouth daily.        Marland Kitchen DIOVAN 80 MG tablet take 1 tablet by mouth once daily  30 tablet  2  . docusate sodium (COLACE) 100 MG capsule Take 100 mg by mouth daily.        . dorzolamide-timolol (COSOPT) 22.3-6.8 MG/ML ophthalmic solution Place 1 drop into both eyes 2 (two) times daily.        Marland Kitchen gabapentin (NEURONTIN) 300 MG capsule        . glucose blood (FREESTYLE LITE) test strip Use to check blood sugar once a day       . latanoprost (XALATAN) 0.005 % ophthalmic solution Place 1 drop into both eyes at bedtime.        Marland Kitchen letrozole (FEMARA) 2.5 MG tablet Take 2.5 mg by mouth every evening.        Marland Kitchen  pilocarpine (PILOCAR) 1 % ophthalmic solution 1 drop 4 (four) times daily.        Marland Kitchen SYNTHROID 100 MCG tablet take 1 tablet by mouth once daily  30 tablet  3  . VESICARE 5 MG tablet take 1 tablet by mouth once daily  30 tablet  3  . warfarin (COUMADIN) 5 MG tablet take as directed BY COUMADIN CLINIC  40 tablet  3  . NIFEdipine (PROCARDIA-XL) 30 MG (OSM) 24 hr tablet Take 30 mg by mouth daily.          BP 120/76  Pulse 66  Temp(Src) 97.7 F (36.5 C) (Oral)  Resp 16  Ht 5' (1.524 m)       Objective:   Physical Exam  Constitutional: She appears well-developed and well-nourished.  Cardiovascular: Normal rate and regular rhythm.   Pulmonary/Chest: Effort normal and breath sounds normal.  Skin:       Hyperpigmentation of chronic venous stasis is noted on the right anterior shin. There is a tender firm area of induration at the top of the hyperpigmentation about mid right shin medially.  Psychiatric: Her behavior is normal. Her mood appears not anxious. She does not exhibit a depressed mood. She exhibits normal  recent memory and normal remote memory.          Assessment & Plan:

## 2011-01-13 NOTE — Patient Instructions (Signed)
Please complete your ultrasound and blood count on the first floor. Keep your appointment with the coumadin clinic.  Continue Doxycycline.  You may add Tylenol 650mg  every 6 hours as needed for pain. Follow up in 1 week.

## 2011-01-13 NOTE — Telephone Encounter (Signed)
Pls call pt this afternoon and let her know that her Ultrasound is negative.  I would like her to continue doxy for 3 additional days then stop. F/u in 1 week.

## 2011-01-13 NOTE — Telephone Encounter (Signed)
Left message on machine to return my call. 

## 2011-01-13 NOTE — Assessment & Plan Note (Signed)
I suspect that her low back pain and foot pain is likely a sciatic-type pain. Given her Coumadin therapy I would avoid NSAIDs but did recommend Tylenol as needed for pain. Given her advanced age would not likely pursue an aggressive workup but rather treat her symptoms.

## 2011-01-14 LAB — CBC WITH DIFFERENTIAL/PLATELET
Eosinophils Absolute: 0.3 10*3/uL (ref 0.0–0.7)
Hemoglobin: 13.7 g/dL (ref 12.0–15.0)
Lymphocytes Relative: 48 % — ABNORMAL HIGH (ref 12–46)
Lymphs Abs: 2.9 10*3/uL (ref 0.7–4.0)
MCH: 26.7 pg (ref 26.0–34.0)
Monocytes Relative: 8 % (ref 3–12)
Neutro Abs: 2.3 10*3/uL (ref 1.7–7.7)
Neutrophils Relative %: 37 % — ABNORMAL LOW (ref 43–77)
RBC: 5.14 MIL/uL — ABNORMAL HIGH (ref 3.87–5.11)
WBC: 6.1 10*3/uL (ref 4.0–10.5)

## 2011-01-14 NOTE — Telephone Encounter (Signed)
Notified pt. She will keep f/u appt on 01/20/11.

## 2011-01-20 ENCOUNTER — Ambulatory Visit (INDEPENDENT_AMBULATORY_CARE_PROVIDER_SITE_OTHER): Payer: Medicare Other | Admitting: Family

## 2011-01-20 ENCOUNTER — Other Ambulatory Visit: Payer: Medicare Other

## 2011-01-20 ENCOUNTER — Encounter: Payer: Self-pay | Admitting: Family

## 2011-01-20 VITALS — BP 130/70 | HR 66 | Temp 97.7°F | Resp 16 | Ht 60.0 in | Wt 150.0 lb

## 2011-01-20 DIAGNOSIS — H409 Unspecified glaucoma: Secondary | ICD-10-CM

## 2011-01-20 DIAGNOSIS — L0390 Cellulitis, unspecified: Secondary | ICD-10-CM

## 2011-01-20 LAB — BASIC METABOLIC PANEL
BUN: 19 mg/dL (ref 6–23)
CO2: 25 mEq/L (ref 19–32)
Glucose, Bld: 84 mg/dL (ref 70–99)
Potassium: 4.5 mEq/L (ref 3.5–5.3)

## 2011-01-20 MED ORDER — LEVOFLOXACIN 500 MG PO TABS: 500.0000 mg | ORAL_TABLET | Freq: Every day | ORAL | Status: AC

## 2011-01-20 MED ORDER — CLINDAMYCIN HCL 300 MG PO CAPS: 300.0000 mg | ORAL_CAPSULE | Freq: Four times a day (QID) | ORAL | Status: AC

## 2011-01-20 NOTE — Progress Notes (Signed)
Subjective:    Patient ID: Jennifer Brennan, female    DOB: May 11, 1924, 78 y.o.   MRN: [id number]  HPI Jennifer Brennan is an 77 year old female who presents today with her daughter and husband for reevaluation of her right lower extremity pain.she was initially seen on July 16 due to some blistering on the right shin. She was treated with doxycycline for 10 days.  She was seen in followup on the 723 and was noted at that time to have resolution of the blisters. There was note of some mild induration at the top of her right shin above the discoloration from her chronic venous stasis. She was instructed to complete her doxycycline. An ultrasound was performed at that time to exclude abscess in the area of induration and this was negative.  Today the patient presents with complaints of increased tenderness on the right shin. The daughter noted some recurrent blistering which has now scabbed. She continues to complain of pain which radiates down the right leg and into the right foot. The family denies any known fever at home. The family notes that she has been complaining about worsening visual acuity.  Review of Systems See history of present illness    Past Medical History  Diagnosis Date  . Hypertension   . Hypothyroidism   . Seizure disorder   . History of cerebrovascular accident   . History of DVT (deep vein thrombosis)     right  . Encounter for long-term (current) use of other medications     coumdain therapy  . Severe stage glaucoma   . Depression with anxiety   . OAB (overactive bladder)     History   Social History  . Marital Status: Married    Spouse Name: N/A    Number of Children: N/A  . Years of Education: N/A   Occupational History  . Not on file.   Social History Main Topics  . Smoking status: Never Smoker   . Smokeless tobacco: Not on file  . Alcohol Use: No  . Drug Use: Not on file  . Sexually Active: Not on file   Other Topics Concern  . Not on file    Social History Narrative   Retired Married     Never Smoked     Alcohol use-no               Past Surgical History  Procedure Date  . Total abdominal hysterectomy   . Total hip arthroplasty     right hip  . Refractive surgery     B/L    No family history on file.  Allergies  Allergen Reactions  . Morphine Sulfate     REACTION: rash: ITCHING  . Penicillins     REACTION: urticaria (hives)  . Sertraline Hcl     REACTION: rash    Current Outpatient Prescriptions on File Prior to Visit  Medication Sig Dispense Refill  . brimonidine (ALPHAGAN) 0.15 % ophthalmic solution Place 1 drop into the left eye every morning.        . Cholecalciferol 1000 UNITS tablet Take 1,000 Units by mouth daily.        Marland Kitchen DIOVAN 80 MG tablet take 1 tablet by mouth once daily  30 tablet  2  . docusate sodium (COLACE) 100 MG capsule Take 100 mg by mouth daily.        . dorzolamide-timolol (COSOPT) 22.3-6.8 MG/ML ophthalmic solution Place 1 drop into both eyes 2 (two) times daily.        Marland Kitchen  gabapentin (NEURONTIN) 300 MG capsule        . glucose blood (FREESTYLE LITE) test strip Use to check blood sugar once a day       . latanoprost (XALATAN) 0.005 % ophthalmic solution Place 1 drop into both eyes at bedtime.        Marland Kitchen letrozole (FEMARA) 2.5 MG tablet Take 2.5 mg by mouth every evening.        . pilocarpine (PILOCAR) 1 % ophthalmic solution 1 drop 4 (four) times daily.        Marland Kitchen SYNTHROID 100 MCG tablet take 1 tablet by mouth once daily  30 tablet  3  . VESICARE 5 MG tablet take 1 tablet by mouth once daily  30 tablet  3  . warfarin (COUMADIN) 5 MG tablet take as directed BY COUMADIN CLINIC  40 tablet  3  . NIFEdipine (PROCARDIA-XL) 30 MG (OSM) 24 hr tablet Take 30 mg by mouth daily.          BP 130/70  Pulse 66  Temp(Src) 97.7 F (36.5 C) (Oral)  Resp 16  Ht 5' (1.524 m)  Wt 150 lb (68.04 kg)  BMI 29.30 kg/m2    Objective:   Physical Exam  Constitutional: She appears well-developed and  well-nourished.  Eyes:       Pupils are noted be constricted lens is somewhat hazy consistent with probable cataracts.  Cardiovascular: Normal rate and regular rhythm.   Pulmonary/Chest: Effort normal and breath sounds normal.  Psychiatric:       Pleasant and calm, she appears to have some memory loss.  Skin- hyperpigmentation is again noted on the right shin. Tenderness to palpation on the medial aspect of the right shin. Skin is firm without discernible abscess. There is no warmth. There is no visible erythema. There is no significant swelling. Extremities: Right lower extremity dorsalis pedis and posterior tibial pulses are 2+. Right foot and toes are warm to touch.  No cyanosis is noted.        Assessment & Plan:   No problem-specific assessment & plan notes found for this encounter.

## 2011-01-20 NOTE — Assessment & Plan Note (Signed)
I recommended to the patient and family that she follow up with her opthalmologist- Dr. Eulah Pont to discuss her vision concerns.

## 2011-01-20 NOTE — Assessment & Plan Note (Signed)
77 year old female with tenderness of the right shin. She is nontoxic-appearing today. Vitals are stable and she is afebrile. Will arrange an MRI of the right lower extremity to further evaluate soft tissue for questionable cellulitis and to exclude underlying osteomyelitis. We'll plan a broad coverage with Levaquin and clindamycin. The Coumadin clinic [facility]) has been notified of her antibiotic therapy. Case was discussed with Dr. Rodena Medin. 25 minutes spent with the patient today. >50% of this time was spent counseling pt and family re: cellulitis and coordination of care.

## 2011-01-20 NOTE — Patient Instructions (Signed)
Please follow up with Dr. Rodena Medin on Wednesday to have your leg rechecked. Start your new antibiotics tonight.  Complete your MRI- we will contact you with results. Call if increased pain, swelling, fever over 101.

## 2011-01-21 ENCOUNTER — Ambulatory Visit
Admission: RE | Admit: 2011-01-21 | Discharge: 2011-01-21 | Disposition: A | Discharge status: Home / Self Care | Payer: Medicare Other | Source: Ambulatory Visit | Attending: Family | Admitting: Family

## 2011-01-21 ENCOUNTER — Telehealth: Payer: Self-pay | Admitting: *Deleted

## 2011-01-21 ENCOUNTER — Telehealth: Payer: Self-pay | Admitting: Pharmacist

## 2011-01-21 DIAGNOSIS — L0390 Cellulitis, unspecified: Secondary | ICD-10-CM

## 2011-01-21 MED ORDER — GADOBENATE DIMEGLUMINE 529 MG/ML IV SOLN
7.0000 mL | Freq: Once | INTRAVENOUS | Status: AC | PRN
  Administered 2011-01-21: 7 mL via INTRAVENOUS

## 2011-01-21 NOTE — Telephone Encounter (Signed)
Per [facility] has already been notified of BUN / Creat values.

## 2011-01-21 NOTE — Telephone Encounter (Signed)
Message copied by Velda Shell on Tue Jan 21, 2011  9:02 AM ------      Message from: O'SULLIVAN, MELISSA      Created: Mon Jan 20, 2011  5:59 PM       I am starting this patient on a 7 day course of levaquin and clinamycin for RLE cellulitis. She is one of your coumadin clinic patients.

## 2011-01-21 NOTE — Telephone Encounter (Signed)
I am not sure why that was placed on her list.    I have removed it from list.  No need to be checking her sugar.

## 2011-01-21 NOTE — Telephone Encounter (Signed)
Pt is to return today for her MRI with and without contrast. Please see lab results and advise if still ok to proceed with IV contrast?  Triage Record Num: [id number] Operator: Fernand Parkins Patient Name: Jennifer Brennan Call Date & Time: 01/20/2011 7:34:18PM Patient Phone: [phone] PCP: Sandford Craze, NP Patient Gender: Female PCP Fax : [phone] Patient DOB: 12/25/1923 Practice Name:  - [facility] Reason for Call: Corrie Dandy, , calling regarding . PCP is . Callback number is [phone]. Delaney Meigs calling from [facility] regarding has STAT BMP lab results. Ordered by NP Sandford Craze today 01/20/11 and collected @ 3:16pm. Abnormal labs; Creatinine (H) 1.14. Normal labs; NA+ 141, K+ 4.5, CL- 106, CO2 25, Glucose 84, BUN 19, Ca + 10.3, Anion Gap 10. Protocol(s) Used: Office Note Recommended Outcome per Protocol: Information Noted and Sent to Office Reason for Outcome: Caller information to office

## 2011-01-21 NOTE — Telephone Encounter (Signed)
Pt's med list has a glucometer and states she is to check her blood sugar once a day. Pt's daughter, [name] states that pt does not have a glucometer and does not check her blood sugar. She does not think her mother has ever had a glucometer. Pt's daughter is questioning why this is on her list? Please advise if we can remove this from pt's med list?

## 2011-01-21 NOTE — Telephone Encounter (Signed)
Yes OK to proceed with MRI with/without contrast.

## 2011-01-22 ENCOUNTER — Encounter (HOSPITAL_COMMUNITY): Payer: Self-pay | Admitting: Radiology

## 2011-01-22 ENCOUNTER — Emergency Department (HOSPITAL_COMMUNITY): Payer: Medicare Other

## 2011-01-22 ENCOUNTER — Ambulatory Visit: Payer: Medicare Other | Admitting: Internal Medicine

## 2011-01-22 ENCOUNTER — Telehealth: Payer: Self-pay | Admitting: Internal Medicine

## 2011-01-22 ENCOUNTER — Observation Stay (HOSPITAL_COMMUNITY)
Admission: EM | Admit: 2011-01-22 | Discharge: 2011-01-24 | Disposition: A | Discharge status: Home / Self Care | Payer: Medicare Other | Attending: Internal Medicine | Admitting: Internal Medicine

## 2011-01-22 DIAGNOSIS — Z79899 Other long term (current) drug therapy: Secondary | ICD-10-CM | POA: Insufficient documentation

## 2011-01-22 DIAGNOSIS — Z8673 Personal history of transient ischemic attack (TIA), and cerebral infarction without residual deficits: Secondary | ICD-10-CM | POA: Insufficient documentation

## 2011-01-22 DIAGNOSIS — M159 Polyosteoarthritis, unspecified: Secondary | ICD-10-CM | POA: Insufficient documentation

## 2011-01-22 DIAGNOSIS — I2789 Other specified pulmonary heart diseases: Secondary | ICD-10-CM | POA: Insufficient documentation

## 2011-01-22 DIAGNOSIS — I10 Essential (primary) hypertension: Secondary | ICD-10-CM | POA: Insufficient documentation

## 2011-01-22 DIAGNOSIS — E785 Hyperlipidemia, unspecified: Secondary | ICD-10-CM | POA: Insufficient documentation

## 2011-01-22 DIAGNOSIS — K219 Gastro-esophageal reflux disease without esophagitis: Secondary | ICD-10-CM | POA: Insufficient documentation

## 2011-01-22 DIAGNOSIS — Z7901 Long term (current) use of anticoagulants: Secondary | ICD-10-CM | POA: Insufficient documentation

## 2011-01-22 DIAGNOSIS — Z853 Personal history of malignant neoplasm of breast: Secondary | ICD-10-CM | POA: Insufficient documentation

## 2011-01-22 DIAGNOSIS — G609 Hereditary and idiopathic neuropathy, unspecified: Secondary | ICD-10-CM | POA: Insufficient documentation

## 2011-01-22 DIAGNOSIS — Z96649 Presence of unspecified artificial hip joint: Secondary | ICD-10-CM | POA: Insufficient documentation

## 2011-01-22 DIAGNOSIS — R079 Chest pain, unspecified: Principal | ICD-10-CM | POA: Insufficient documentation

## 2011-01-22 DIAGNOSIS — G40909 Epilepsy, unspecified, not intractable, without status epilepticus: Secondary | ICD-10-CM | POA: Insufficient documentation

## 2011-01-22 DIAGNOSIS — I82409 Acute embolism and thrombosis of unspecified deep veins of unspecified lower extremity: Secondary | ICD-10-CM | POA: Insufficient documentation

## 2011-01-22 DIAGNOSIS — M8700 Idiopathic aseptic necrosis of unspecified bone: Secondary | ICD-10-CM | POA: Insufficient documentation

## 2011-01-22 LAB — POCT I-STAT, CHEM 8
Calcium, Ion: 1.38 mmol/L — ABNORMAL HIGH (ref 1.12–1.32)
Creatinine, Ser: 1.5 mg/dL — ABNORMAL HIGH (ref 0.50–1.10)
Hemoglobin: 15 g/dL (ref 12.0–15.0)
Sodium: 143 mEq/L (ref 135–145)
TCO2: 28 mmol/L (ref 0–100)

## 2011-01-22 LAB — DIFFERENTIAL
Basophils Absolute: 0 10*3/uL (ref 0.0–0.1)
Basophils Relative: 0 % (ref 0–1)
Eosinophils Absolute: 0.3 10*3/uL (ref 0.0–0.7)
Eosinophils Relative: 4 % (ref 0–5)
Monocytes Absolute: 0.5 10*3/uL (ref 0.1–1.0)
Neutro Abs: 3.3 10*3/uL (ref 1.7–7.7)

## 2011-01-22 LAB — TROPONIN I: Troponin I: <0.3 ng/mL (ref <0.30)

## 2011-01-22 LAB — PROTIME-INR: Prothrombin Time: 25.7 seconds — ABNORMAL HIGH (ref 11.6–15.2)

## 2011-01-22 LAB — CBC
Hemoglobin: 13.9 g/dL (ref 12.0–15.0)
MCH: 27.2 pg (ref 26.0–34.0)
MCHC: 34.5 g/dL (ref 30.0–36.0)
Platelets: 199 10*3/uL (ref 150–400)
RDW: 14.6 % (ref 11.5–15.5)

## 2011-01-22 MED ORDER — IOHEXOL 300 MG/ML  SOLN
70.0000 mL | Freq: Once | INTRAMUSCULAR | Status: AC | PRN
  Administered 2011-01-22: 70 mL via INTRAVENOUS

## 2011-01-22 NOTE — Telephone Encounter (Signed)
Notified pt's daughter, [name].

## 2011-01-22 NOTE — Telephone Encounter (Signed)
Patient would like to know if her leg is infected. Would like to know results.

## 2011-01-22 NOTE — Telephone Encounter (Signed)
Call placed to patient at [phone], no answer, no voicemail.

## 2011-01-23 ENCOUNTER — Ambulatory Visit: Payer: Medicare Other | Admitting: Internal Medicine

## 2011-01-23 DIAGNOSIS — I059 Rheumatic mitral valve disease, unspecified: Secondary | ICD-10-CM

## 2011-01-23 DIAGNOSIS — R0789 Other chest pain: Secondary | ICD-10-CM

## 2011-01-23 LAB — CBC
HCT: 36.4 % (ref 36.0–46.0)
Hemoglobin: 12.3 g/dL (ref 12.0–15.0)
MCHC: 33.8 g/dL (ref 30.0–36.0)
MCV: 78.8 fL (ref 78.0–100.0)
RDW: 14.7 % (ref 11.5–15.5)

## 2011-01-23 LAB — COMPREHENSIVE METABOLIC PANEL
AST: 17 U/L (ref 0–37)
Albumin: 2.9 g/dL — ABNORMAL LOW (ref 3.5–5.2)
BUN: 15 mg/dL (ref 6–23)
Chloride: 107 mEq/L (ref 96–112)
Creatinine, Ser: 1.32 mg/dL — ABNORMAL HIGH (ref 0.50–1.10)
Potassium: 4 mEq/L (ref 3.5–5.1)
Total Bilirubin: 0.4 mg/dL (ref 0.3–1.2)
Total Protein: 5.8 g/dL — ABNORMAL LOW (ref 6.0–8.3)

## 2011-01-23 LAB — CARDIAC PANEL(CRET KIN+CKTOT+MB+TROPI)
CK, MB: 2.3 ng/mL (ref 0.3–4.0)
CK, MB: 2.2 ng/mL (ref 0.3–4.0)
Relative Index: INVALID (ref 0.0–2.5)
Relative Index: INVALID (ref 0.0–2.5)
Relative Index: INVALID (ref 0.0–2.5)
Total CK: 42 U/L (ref 7–177)
Total CK: 47 U/L (ref 7–177)
Troponin I: <0.3 ng/mL (ref <0.30)
Troponin I: <0.3 ng/mL (ref <0.30)
Troponin I: <0.3 ng/mL (ref <0.30)

## 2011-01-23 LAB — DIFFERENTIAL
Eosinophils Relative: 4 % (ref 0–5)
Lymphocytes Relative: 46 % (ref 12–46)
Lymphs Abs: 2.9 10*3/uL (ref 0.7–4.0)
Monocytes Absolute: 0.7 10*3/uL (ref 0.1–1.0)
Neutro Abs: 2.5 10*3/uL (ref 1.7–7.7)

## 2011-01-23 LAB — PROTIME-INR: INR: 2.42 — ABNORMAL HIGH (ref 0.00–1.49)

## 2011-01-23 LAB — LIPID PANEL: Cholesterol: 201 mg/dL — ABNORMAL HIGH (ref 0–200)

## 2011-01-23 LAB — APTT: aPTT: 42 seconds — ABNORMAL HIGH (ref 24–37)

## 2011-01-23 LAB — MAGNESIUM: Magnesium: 2.1 mg/dL (ref 1.5–2.5)

## 2011-01-23 LAB — TSH: TSH: 0.348 u[IU]/mL — ABNORMAL LOW (ref 0.350–4.500)

## 2011-01-23 NOTE — H&P (Signed)
NAME:  Jennifer Brennan, Jennifer Brennan NO.:  [id number]  MEDICAL RECORD NO.:  [id number]  LOCATION:  [facility]                         FACILITY:  [facility]  PHYSICIAN:  Talmage Nap, MD  DATE OF BIRTH:  Jul 06, 1923  DATE OF ADMISSION:  01/22/2011 DATE OF DISCHARGE:                             HISTORY & PHYSICAL   History obtained from patient and patient's 2 daughters.  PRIMARY CARE PHYSICIAN:  Barbette Hair. Artist Pais, DO  CHIEF COMPLAINT:  Chest pain which started this afternoon.  The patient is an 77 year old very pleasant African American female with history of breast CA status post mastectomy, legally blind, also has a history of CVA with left hemiparesis, most recently treated for cellulitis of the right lower extremity presenting to the emergency room with chest pain which started at rest.  The patient described the pain as "gas like" and  sometime radiating to the back and she also felt some acid taste in her mouth.  There was no associated diaphoresis.  No history of shortness of breath.  No nausea or vomiting.  No fever, no chills, no rigor.  There is no known relieving or aggravating factor. Denies any cough.  Chest pain was said to have persisted, hence patient was brought to the emergency room to be evaluated.  PAST MEDICAL HISTORY: 1. Positive for hypertension. 2. Legally blind. 3. Breast CA. 4. Cataract. 5. CVA. 6. History of DVT. 7. Glaucoma. 8. Hypothyroidism. 9. Seizure disorder.  PAST SURGICAL HISTORY: 1. Breast CA status post left mastectomy. 2. Right hip replacement.  Preadmission med without dosages include: 1. Coumadin. 2. Diovan. 3. Gabapentin. 4. Procardia. 5. Synthroid.  Allergies to HYDROCODONE and PENICILLIN.  SOCIAL HISTORY:  Negative for alcohol or tobacco use and the patient lives at home with her daughter.  FAMILY HISTORY:  York Spaniel to be positive for diabetes mellitus, hypertension, and breast CA.  REVIEW OF SYSTEMS:  The patient denies  any history of headaches.  No blurred vision, no nausea, no vomiting, no MR, retrosternal discomfort with some bitter taste in her mouth.  No radiation of the pain.  Denies any cough.  Denies any diaphoresis.  Denies any PND, orthopnea.  Denies any fever.  No chills, no rigor.  Denies any abdominal discomfort.  No diarrhea or hematochezia.  No dysuria or hematuria.  Has hyperpigmentation of the right lower extremity with paresthesia of the lower extremity worse on the right than on the left.  No intolerance to heat or cold and no neuropsychiatric disorder.  PHYSICAL EXAMINATION:  GENERAL:  An elderly lady not in any distress, very cheerful, well-hydrated at present. VITAL SIGNS:  Blood pressure is 189/63, pulse is 73, respiratory rate 18, temperature is 98.6. HEENT:  Mild pallor.  Pupils nonreactive to light bilaterally. NECK:  No jugular venous distention.  No carotid bruit, no lymphadenopathy. CHEST:  Clear to auscultation. HEART:  Heart sounds are 1 and 2. ABDOMEN:  Soft, nontender.  Liver and spleen tip not palpable.  Bowel sounds are positive. EXTREMITIES:  Show hyperpigmentation at the right lower extremity with scattered area of hypopigmentation.  No pedal edema. NEUROLOGIC:  Show left hemiparesis. NEUROPSYCHIATRIC EVALUATION:  Unremarkable. SKIN:  Showed hyperpigmentation of the right lower  extremity with scattered areas of hypopigmentation in the left lower extremity.  LABORATORY DATA:  First set of cardiac markers, troponin-I less than 0.30.  Chemistry shows sodium of 143, potassium of 3.9, chloride of 105, BUN 16, creatinine is 1.50, glucose is 96.  Hematological indices showed WBC of 7.4, hemoglobin of 13.9, hematocrit of 40.3, MCV of 78.9 with a platelet count of 199 normal differentials.  Imaging studies done on the patient include CT angiogram with contrast which showed no evidence of acute pulmonary embolism and no thoracic aortic dissection.  EKG showed normal  sinus rhythm with a rate of 69. No ST-wave change noted.  IMPRESSION: 1. Chest pain.  Rule out acute coronary syndrome, most likely low     probability.  Differential will include reflux.  Other medical     problems include hypertension. 2. History of breast cancer status post mastectomy. 3. Legally blind. 4. Glaucoma. 5. History of cerebrovascular accident with left hemiparesis. 6. History of deep vein thrombosis. 7. Hypothyroidism. 8. Seizure disorder.  Plan is to admit patient to telemetry.  The patient will be saline locked.  She will be given nitroglycerin sublingual p.r.n. for chest pain, aspirin 325 mg p.o. daily, morphine 2 mg IV q.4 p.r.n.  She will also be on Protonix 40 mg p.o. daily and also Carafate 1 g p.o. t.i.d. with meals.  The patient's blood pressure will be maintained with Diovan 320 mg p.o. daily and Procardia 90 mg p.o. daily.  She will also be on Synthroid 100 mcg p.o. daily for hypothyroidism, and Coumadin for her DVT, dosing will be done by pharmacy.  Further workup to be done on this patient will include cardiac enzymes q.6 x3.Coagulation profile, PT, PTT, and INR will be repeated in a.m. as well as CBC, CMP, and magnesium.  Finally, if 3 set of cardiac enzymes are negative, the patient could be considered for discharge.  She will however be evaluated and followed on a daily basis.     Talmage Nap, MD     CN/MEDQ  D:  01/22/2011  T:  01/22/2011  Job:  [id number]  Electronically Signed by Talmage Nap  on 01/23/2011 12:49:18 AM

## 2011-01-23 NOTE — Telephone Encounter (Signed)
Pt and daughter were notified yesterday evening of normal MRI result of her leg. See result note.

## 2011-01-24 ENCOUNTER — Encounter: Payer: Medicare Other | Admitting: *Deleted

## 2011-01-24 LAB — BASIC METABOLIC PANEL
CO2: 29 mEq/L (ref 19–32)
Calcium: 8.9 mg/dL (ref 8.4–10.5)
GFR calc Af Amer: >60 mL/min (ref >60)
Potassium: 3.8 mEq/L (ref 3.5–5.1)

## 2011-01-24 LAB — PROTIME-INR: Prothrombin Time: 25.9 seconds — ABNORMAL HIGH (ref 11.6–15.2)

## 2011-01-26 NOTE — Discharge Summary (Signed)
Jennifer Brennan, Jennifer Brennan             ACCOUNT NO.:  [id number]  MEDICAL RECORD NO.:  [id number]  LOCATION:  2029                         FACILITY:  [facility]  PHYSICIAN:  Conley Canal, MD      DATE OF BIRTH:  July 30, 1923  DATE OF ADMISSION:  01/22/2011 DATE OF DISCHARGE:  01/24/2011                        DISCHARGE SUMMARY - REFERRING   PRIMARY CARE PHYSICIAN:  Barbette Hair. Artist Pais, DO  CONSULTING PHYSICIANS:  Doylene Canning. Ladona Ridgel, MD with [facility].  DISCHARGE DIAGNOSES: 1. Chest pain most likely secondary to gastroesophageal reflux disease     versus acute costochondritis, myocardial infarction ruled out by     serial cardiac enzymes. 2. Mild pulmonary hypertension with peak pulmonary arterial pressure     of 40 mmHg. 3. Malignant hypertension. 4. 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 5. Seizure disorder. 6. History of right lower extremity deep venous thrombosis, on     Coumadin. 7. History of breast cancer, status post left mastectomy. 8. Hyperlipidemia, glaucoma, hypothyroidism, osteoarthritis with     avascular necrosis. 9. Peripheral neuropathy. 10.Anxiety, depression. 11.Gastroesophageal reflux disease. 12.History of severe right leg cellulitis, treated.  DISCHARGE MEDICATIONS: 1. Aspirin 81 mg daily. 2. Synthroid 88 mcg a.c. breakfast. 3. Prilosec 20 mg twice daily. 4. Alphagan 0.15% in left eye every morning. 5. Vitamin D3 1000 units daily. 6. Cosopt one drop in both eyes twice daily. 7. Coumadin as prescribed by the patient's primary care provider. 8. Diovan 80 mg daily. 9. Colace 100 mg daily as needed. 10.Dulcolax 10 mg daily as needed. 11.Femara 2.5 mg every evening. 12.Neurontin 300 mg in the morning, 600 mg in the evening. 13.Nifedipine XR 30 mg daily. 14.Pilocarpine 1% ophthalmic eye drops four times daily. 15.Tylenol Extra Strength 500 mg OTC q. 6 hourly as needed. 16.Vesicare 5 mg daily/ 17.Vitamin D3 2000 units  OTC daily. 18.Xalatan 0.005% 1 drop in both eyes at bedtime.  PROCEDURES PERFORMED: 1. CT of the chest with contrast on 8/1 showed no PE or aortic     dissection. 2. 2-D echocardiogram on 8/2 showed EF 60% to 65% with normal wall     motion grade 1 diastolic dysfunction and an anterior septal     aneurysm peak pulmonary artery pressure of 40 mmHg.  HOSPITAL COURSE:  Ms. Spade is a very pleasant 77 year old female who came in with complaints of chest pain of one day duration.  There was concern for acute coronary syndrome.  Serial cardiac enzymes were negative.  Cardiology was consulted and performed 2-D echocardiogram with findings as noted above.  She likely has gastroesophageal reflux disease, possibility of acute costochondritis.  She was started on PPI as well as sacral foot and responded well to it.  In this hospitalization, the patient noted to have TSH of 0.348, hence Synthroid dose reduced to 88 mcg from 100 mcg daily.  The patient is also noted to be hyperlipidemic, hence discharged on Zocor 20 mg at bedtime.  She wasseen by Cardiology who felt that her symptoms were noncardiac in nature and did not feel like she needed further cardiac workup.  She should follow with Dr. Artist Pais in the next 1 to 2 weeks.  Please  also add that antibiotics for the right leg cellulitis were not continued in this admission as she had completed 2 weeks and there was no evidence of recurrent infection.  She is discharged in stable condition.     Conley Canal, MD     SR/MEDQ  D:  01/24/2011  T:  01/24/2011  Job:  [id number]  cc:   Barbette Hair. Artist Pais, DO  Electronically Signed by Conley Canal  on 01/26/2011 05:46:55 PM

## 2011-01-30 ENCOUNTER — Other Ambulatory Visit: Payer: Self-pay | Admitting: Oncology

## 2011-01-30 ENCOUNTER — Encounter (HOSPITAL_BASED_OUTPATIENT_CLINIC_OR_DEPARTMENT_OTHER): Payer: Medicare Other | Admitting: Oncology

## 2011-01-30 DIAGNOSIS — C50219 Malignant neoplasm of upper-inner quadrant of unspecified female breast: Secondary | ICD-10-CM

## 2011-01-30 LAB — COMPREHENSIVE METABOLIC PANEL
ALT: 19 U/L (ref 0–35)
Albumin: 3.2 g/dL — ABNORMAL LOW (ref 3.5–5.2)
CO2: 26 mEq/L (ref 19–32)
Calcium: 9.4 mg/dL (ref 8.4–10.5)
Chloride: 108 mEq/L (ref 96–112)
Glucose, Bld: 96 mg/dL (ref 70–99)
Potassium: 3.9 mEq/L (ref 3.5–5.3)
Sodium: 142 mEq/L (ref 135–145)
Total Bilirubin: 0.4 mg/dL (ref 0.3–1.2)
Total Protein: 7 g/dL (ref 6.0–8.3)

## 2011-01-30 LAB — CBC WITH DIFFERENTIAL/PLATELET
Eosinophils Absolute: 0.4 10*3/uL (ref 0.0–0.5)
LYMPH%: 41.4 % (ref 14.0–49.7)
MONO#: 0.4 10*3/uL (ref 0.1–0.9)
NEUT#: 2 10*3/uL (ref 1.5–6.5)
Platelets: 198 10*3/uL (ref 145–400)
RBC: 4.85 10*6/uL (ref 3.70–5.45)
WBC: 4.8 10*3/uL (ref 3.9–10.3)
lymph#: 2 10*3/uL (ref 0.9–3.3)

## 2011-02-03 ENCOUNTER — Encounter: Payer: Medicare Other | Admitting: *Deleted

## 2011-02-12 ENCOUNTER — Encounter (INDEPENDENT_AMBULATORY_CARE_PROVIDER_SITE_OTHER): Payer: Self-pay | Admitting: Surgery

## 2011-03-05 ENCOUNTER — Ambulatory Visit (INDEPENDENT_AMBULATORY_CARE_PROVIDER_SITE_OTHER): Payer: Medicare Other | Admitting: Internal Medicine

## 2011-03-05 ENCOUNTER — Encounter: Payer: Self-pay | Admitting: Internal Medicine

## 2011-03-05 ENCOUNTER — Other Ambulatory Visit (INDEPENDENT_AMBULATORY_CARE_PROVIDER_SITE_OTHER): Payer: Medicare Other

## 2011-03-05 DIAGNOSIS — E039 Hypothyroidism, unspecified: Secondary | ICD-10-CM

## 2011-03-05 DIAGNOSIS — I6350 Cerebral infarction due to unspecified occlusion or stenosis of unspecified cerebral artery: Secondary | ICD-10-CM

## 2011-03-05 DIAGNOSIS — I639 Cerebral infarction, unspecified: Secondary | ICD-10-CM

## 2011-03-05 DIAGNOSIS — C50919 Malignant neoplasm of unspecified site of unspecified female breast: Secondary | ICD-10-CM

## 2011-03-05 DIAGNOSIS — K219 Gastro-esophageal reflux disease without esophagitis: Secondary | ICD-10-CM

## 2011-03-05 MED ORDER — OMEPRAZOLE 20 MG PO CPDR: 20.0000 mg | DELAYED_RELEASE_CAPSULE | Freq: Every day | ORAL | Status: DC

## 2011-03-05 MED ORDER — OMEPRAZOLE 20 MG PO CPDR: 20.0000 mg | DELAYED_RELEASE_CAPSULE | Freq: Two times a day (BID) | ORAL | Status: DC

## 2011-03-05 NOTE — Assessment & Plan Note (Signed)
Dose reduced to x30d, now back 

## 2011-03-05 NOTE — Patient Instructions (Signed)
It was good to see you today. We have reviewed your prior records including labs and tests today Medications reviewed, no changes at this time. Test(s) ordered today. Your results will be called to you after review (48-72hours after test completion). If any changes need to be made, you will be notified at that time. we'll make referral to [facility] to monitor your INR levels. Our office will contact you regarding appointment(s) once made. Please schedule followup in 3-4 months to monitor blood pressure and thyroid, call sooner if problems.

## 2011-03-05 NOTE — Assessment & Plan Note (Signed)
Recent hosp 01/2011 for chest pain related to same Refill and continue PPI as no recurrent burning or chest pain symptoms

## 2011-03-05 NOTE — Progress Notes (Signed)
Subjective:    Patient ID: Jennifer Brennan, female    DOB: December 04, 1923, 77 y.o.   MRN: [id number]  HPI  New pt to me but know to our division - > txfr from [facility] to [facility] office Here to establish care  hosp x 48h 01/2011 at [facility] for chest pain DISCHARGE DIAGNOSES: 1. Chest pain most likely secondary to gastroesophageal reflux disease     versus acute costochondritis, myocardial infarction ruled out by     serial cardiac enzymes. 2. Mild pulmonary hypertension with peak pulmonary arterial pressure     of 40 mmHg. 3. Malignant hypertension. 4. 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 5. Seizure disorder. 6. History of right lower extremity deep venous thrombosis, on     Coumadin. 7. History of breast cancer, status post left mastectomy. 8. Hyperlipidemia, glaucoma, hypothyroidism, osteoarthritis with     avascular necrosis. 9. Peripheral neuropathy. 10.Anxiety, depression. 11.Gastroesophageal reflux disease. No recurrent chest pain symptoms since dc home  Also reviewed chronic medical issues: hypertension - hx labile but the patient reports compliance with medication(s) as prescribed. Denies adverse side effects. Glaucoma - follows with optho for same - severe dx with legal blindness resulting from same - no eye pain or new vision changes Hypothyroid - reports decrease in dose x 30d following hosp for chest pain 01/2011 - now back on original dose - feels "cold" but no skin or weight changes  Past Medical History  Diagnosis Date  . Hypertension   . Hypothyroidism   . Seizure disorder   . History of cerebrovascular accident   . Encounter for long-term (current) use of other medications     coumdain therapy  . Severe stage glaucoma   . Depression with anxiety   . OAB (overactive bladder)   . ADENOCARCINOMA, LEFT BREAST 04/11/2010  . Arthritis   . BACK PAIN, LUMBAR, CHRONIC   . DVT     RLE  . GLAUCOMA   . OSTEOARTHRITIS, HANDS, BILATERAL    . OSTEOPENIA   . Retinal ischemia   . Sciatica of right side   . VITAMIN D DEFICIENCY dx 08/2008   Family History  Problem Relation Age of Onset  . Ovarian cancer Mother   . Arthritis Other     grandparents  . Lung cancer Other   . Heart disease Other     parent   History  Substance Use Topics  . Smoking status: Former Games developer  . Smokeless tobacco: Not on file  . Alcohol Use: No   Review of Systems Constitutional: Negative for fever.  Respiratory: Negative for cough and shortness of breath.   Cardiovascular: Negative for chest pain.  Gastrointestinal: Negative for abdominal pain.  Musculoskeletal: Negative for gait problem.  Skin: Negative for rash or open sores on RLE.  Neurological: Negative for dizziness. Dtr concerned for pt memory loss, spouse denies same. No other specific complaints in a complete review of systems (except as listed in HPI above).     Objective:   Physical Exam BP 130/62  Pulse 58  Temp(Src) 98.2 F (36.8 C) (Oral)  Ht 5' (1.524 m)  Wt 151 lb 9.6 oz (68.765 kg)  BMI 29.61 kg/m2  SpO2 96% Constitutional: Jennifer Brennan is overweight but appears well-developed and well-nourished. No distress. Spouse and dtr at side HENT: Head: Normocephalic and atraumatic. Ears: B TMs ok, no erythema or effusion; Nose: Nose normal.  Mouth/Throat: Oropharynx is clear and moist. No oropharyngeal exudate.  Eyes: B vision  impaired due to glaucoma - no conjunctival injection or jaundice Neck: thick. Normal range of motion. Neck supple. No JVD or LAD present. No thyromegaly present.  Cardiovascular: Normal rate, regular rhythm and normal heart sounds.  No murmur heard. No BLE edema. Pulmonary/Chest: Effort normal and breath sounds normal. No respiratory distress. Jennifer Brennan has no wheezes.  Abdominal: Soft. Bowel sounds are normal. Jennifer Brennan exhibits no distension. There is no tenderness. no masses Musculoskeletal: Normal range of motion, no joint effusions or gross deformities Neurological:  Jennifer Brennan is alert and oriented to person, place, and time. No cranial nerve deficit. Coordination normal and MAE.  Skin: chronic RLE distal venous changes/darkening -no open lesions; Skin is warm and dry. No rash noted. No erythema.  Psychiatric: Jennifer Brennan has a normal mood and affect. Her behavior is normal. Judgment and thought content normal.   Lab Results  Component Value Date   WBC 6.3 01/23/2011   HGB 13.4 01/30/2011   HCT 39.3 01/30/2011   PLT 198 01/30/2011   CHOL 201* 01/23/2011   TRIG 112 01/23/2011   HDL 64 01/23/2011   ALT 19 01/30/2011   AST 23 01/30/2011   NA 142 01/30/2011   K 3.9 01/30/2011   CL 108 01/30/2011   CREATININE 1.18* 01/30/2011   BUN 15 01/30/2011   CO2 26 01/30/2011   TSH 0.348* 01/23/2011   INR 2.32* 01/24/2011   HGBA1C 5.7 08/06/2007      Assessment & Plan:  See problem list. Medications and labs reviewed today.  Time spent with pt/family today 40 minutes, greater than 50% time spent counseling patient on recent hospitalization for chest pain, hypertension, breast cancer and medication review. Also review of prior records

## 2011-03-06 NOTE — Assessment & Plan Note (Signed)
Dx 04/2010: T2N0 grade 3 invasive ductal ca, HER2 negative, higly E/P receptor positive S/p L mastectomy and LN sampling Started letrozole 10/2010 and tolerating without unusual side effects - tx 5 years planned Follows with onc for same ([facility]) - no change in tx recommended

## 2011-03-06 NOTE — Assessment & Plan Note (Signed)
Chronic anticoag related to this and hx DVT - Follows with [facility] - rec follow up as no appt since hosp 01/2011

## 2011-03-12 ENCOUNTER — Ambulatory Visit (INDEPENDENT_AMBULATORY_CARE_PROVIDER_SITE_OTHER): Payer: Medicare Other | Admitting: *Deleted

## 2011-03-12 DIAGNOSIS — I82409 Acute embolism and thrombosis of unspecified deep veins of unspecified lower extremity: Secondary | ICD-10-CM

## 2011-03-12 DIAGNOSIS — Z8679 Personal history of other diseases of the circulatory system: Secondary | ICD-10-CM

## 2011-03-19 ENCOUNTER — Other Ambulatory Visit: Payer: Self-pay | Admitting: Cardiology

## 2011-03-20 ENCOUNTER — Other Ambulatory Visit: Payer: Self-pay | Admitting: Sports Medicine

## 2011-03-20 DIAGNOSIS — M549 Dorsalgia, unspecified: Secondary | ICD-10-CM

## 2011-03-22 ENCOUNTER — Ambulatory Visit
Admission: RE | Admit: 2011-03-22 | Discharge: 2011-03-22 | Disposition: A | Discharge status: Home / Self Care | Payer: Medicare Other | Source: Ambulatory Visit | Attending: Sports Medicine | Admitting: Sports Medicine

## 2011-03-22 DIAGNOSIS — M549 Dorsalgia, unspecified: Secondary | ICD-10-CM

## 2011-03-24 ENCOUNTER — Telehealth: Payer: Self-pay | Admitting: *Deleted

## 2011-03-24 NOTE — Telephone Encounter (Signed)
1.Pt was given percocet by ortho. She has to take benadryl for itching caused by percocet. Is it ok given her other meds, to take benadryl?   2. Pt c/o decrease urine output and some swelling in her ankles.  Daughter wants PCP to be "in the loop". Please advise.

## 2011-03-24 NOTE — Telephone Encounter (Signed)
1) Ok for benadryl prn - but may need to consider alt pain pill if continued allergy/rxn - pt to check or f/u with ortho as needed 2) increase fluid intake and keep legs elevated when not active (walking, out, etc) - if continued urine or swelling problems, sched ov - thx

## 2011-03-25 NOTE — Telephone Encounter (Signed)
Pt's daughter advised of same 

## 2011-03-31 LAB — PROTIME-INR
INR: 1.1
Prothrombin Time: 13.5
Prothrombin Time: 13.9

## 2011-03-31 LAB — COMPREHENSIVE METABOLIC PANEL
ALT: 22
AST: 24
CO2: 27
Calcium: 9.1
Chloride: 108
Creatinine, Ser: 1.03
GFR calc non Af Amer: 51 — ABNORMAL LOW
Glucose, Bld: 164 — ABNORMAL HIGH
Total Bilirubin: 0.7

## 2011-03-31 LAB — CBC
HCT: 40.8
Hemoglobin: 13.5
MCHC: 33.2
MCV: 82.1
RBC: 4.97
WBC: 9.8

## 2011-03-31 LAB — LUPUS ANTICOAGULANT PANEL
DRVVT: 42.3 (ref 36.1–47.0)
Lupus Anticoagulant: NOT DETECTED
PTT Lupus Anticoagulant: 47.1 (ref 36.3–48.8)

## 2011-03-31 LAB — DIFFERENTIAL
Basophils Absolute: 0.1
Basophils Relative: 1
Eosinophils Relative: 1
Lymphocytes Relative: 36
Monocytes Absolute: 0.7
Neutro Abs: 5.3

## 2011-03-31 LAB — PROTEIN C, TOTAL: Protein C, Total: 88 % (ref 70–140)

## 2011-03-31 LAB — C-REACTIVE PROTEIN: CRP: 0.3 — ABNORMAL LOW (ref <0.6)

## 2011-04-02 ENCOUNTER — Other Ambulatory Visit: Payer: Self-pay | Admitting: Internal Medicine

## 2011-04-03 NOTE — Consult Note (Signed)
Jennifer Brennan, Jennifer Brennan NO.:  [id number]  MEDICAL RECORD NO.:  [id number]  LOCATION:  2029                         FACILITY:  [facility]  PHYSICIAN:  Doylene Canning. Ladona Ridgel, MD    DATE OF BIRTH:  04-Apr-1924  DATE OF CONSULTATION:  01/23/2011 DATE OF DISCHARGE:                                CONSULTATION   PRIMARY CARDIOLOGIST:  New to [facility] being seen by Dr. Lewayne Bunting.  PRIMARY CARE PROVIDER:  Barbette Hair. Artist Pais, DO  PATIENT PROFILE:  An 77 year old female without prior cardiac history who was admitted yesterday with a 1-day history of chest pain which has been constant and has had negative enzymes.  PROBLEMS: 1. Chest and epigastric pain. 2. Hypertension. 3. 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 4. History of cerebrovascular accident. 5. Seizure disorder. 6. History of right lower extremity deep vein thrombosis on chronic     Coumadin. 7. History of breast cancer status post left mastectomy in November     2011. 8. Glaucoma. 9. Hyperlipidemia. 10.Legal blindness. 11.Cataracts. 12.Hypothyroidism. 13.Osteoarthritis/avascular necrosis.     a.     Status post right total hip arthroplasty May 2008. 14.History of vasovagal syncope. 15.History of Stevens-Johnson syndrome with Dilantin. 16.Peripheral neuropathy. 17.Anxiety. 18.Depression. 19.Deconditioning.  ALLERGIES:  MORPHINE, ADHESIVE, CODEINE, PENICILLIN, HYDROCODONE, DILANTIN, CEPHALOSPORIN.  HISTORY OF PRESENT ILLNESS:  An 77 year old female without prior cardiac history.  She was in her usual state of health until Tuesday, January 21, 2011, when at approximately 12 noon she developed an intense sternal chest discomfort while sitting.  She had no associated symptoms and reports that the pain felt like "a bad indigestion."  Symptoms persisted the remainder of the day and she noted the symptoms were worse just after eating.  She has felt well Tuesday night  but continued to have chest pain on Wednesday.  The pain does not change with palpation, deep breathing, cough or position changes.  Her daughter took her to the [facility] ED yesterday where her ECG was nonacute and enzymes were negative.  A CT of the chest was performed which was negative for PE (her INR is therapeutic).  She was admitted by the Hospitalist Service and has since ruled out.  She continued to have constant 6/10 epigastric and chest discomfort which continues to be worse following eating and was helped last night with GI cocktail.  CURRENT MEDICATIONS: 1. Aspirin 325 mg daily. 2. Neurontin 300 mg daily. 3. Synthroid 100 mcg daily. 4. Procardia 9 mg daily. 5. Benicar 40 mg daily. 6. Protonix 40 mg daily. 7. Carafate 1 g t.i.d. 8. Coumadin as directed.  FAMILY HISTORY:  Positive for diabetes, hypertension, breast cancer.  SOCIAL HISTORY:  The patient lives with her husband and daughter.  She does not currently work.  She has a 35-pack-year history of tobacco use quitting 30 years ago.  She denies alcohol or drugs. Does not routinely exercises.  REVIEW OF SYSTEMS:  Positive for chest pain as well, is all in the HPI. She is legally blind.  Otherwise all systems reviewed are negative.  PHYSICAL EXAMINATION:  VITAL SIGNS:  Temperature 98.2, heart rate 66, respiratory rate 18, blood  pressure 116/72, pulse ox 95% on room air, weight is 70.625 kg. GENERAL:  Pleasant African American female in no acute distress.  Alert, awake, and oriented x3.  She has a normal affect. HEENT:  Normal with exception of legal blindness. SKIN:  Warm and dry with chronic venous stasis changes to the right lower extremity.  Nares grossly intact. NECK:  Supple without bruits, JVD. LUNGS:  Respirations are regular and unlabored.  Clear to auscultation. CARDIAC:  Regular S1, S2.  No S3, S4, murmurs. ABDOMEN:  Round, soft, nontender, nondistended.  Bowel sounds present x4. EXTREMITIES:  Warm,  dry.  She has chronic venous stasis changes right lower extremity with trace bilateral lower extremity edema.  Distal pulses are 1+ and equal bilaterally.  IMAGING:  CT angio of the chest on January 22, 2011, showed no PE or dissection.  Chest x-ray showed no acute disease.  EKG shows sinus rhythm rate of 69, normal axis, left atrial enlargement.  No acute changes.  LABORATORY DATA:  Hemoglobin 12.3, hematocrit 36.4, WBC 6.3, platelets 174.  Sodium 143, potassium 4.0, chloride 107, CO2 29, BUN 15, creatinine 1.32, glucose 103, total bilirubin 0.4, alkaline phosphatase 51, AST 17, ALT 17, total protein 5.8, albumin 2.9, INR 2.42, calcium 9.8, magnesium 2.1.  Cardiac markers negative x3.  ASSESSMENT AND PLAN:  The patient presents with a 2-day history of constant chest pain radiating through to her back.  Pain persists and has worsened immediately following meals and was improved last night following GI cocktail.  Enzymes are negative and ECG is not acute.  Doubt ischemia though will follow up echocardiogram.  If echo is normal,  would not pursue any additional ischemic workup.  She is on a PPI at home and this was reintroduced today.  Consider GI evaluation.     Nicolasa Ducking, ANP   ______________________________ Doylene Canning. Ladona Ridgel, MD    CB/MEDQ  D:  01/23/2011  T:  01/23/2011  Job:  [id number]  Electronically Signed by Nicolasa Ducking ANP on 01/31/2011 03:33:58 PM Electronically Signed by Lewayne Bunting MD on 04/03/2011 06:34:52 PM

## 2011-04-07 ENCOUNTER — Telehealth: Payer: Self-pay | Admitting: Pharmacist

## 2011-04-07 MED ORDER — ENOXAPARIN SODIUM 60 MG/0.6ML ~~LOC~~ SOLN: 60.0000 mg | SUBCUTANEOUS | Status: DC

## 2011-04-07 NOTE — Telephone Encounter (Signed)
Pt having spinal injection on 10/23 at 1:30.  Needs lovenox bridge due to hx of CVA.  Pt's weight- 68 kg, SCr-1.18, CrCl~40mL/min. Since pt's CrCl has been fluctuating lately, will go ahead and dose at 1mg /kg once daily given her age as well.  Instructions given to pt's daughter Jennifer Brennan.   10/18- Last Dose 10/19- No Coumadin or Lovenox 10/20- Lovenox 60mg  in AM 10/21- Lovenox 60mg  in AM 10/22- Lovenox 60mg  in AM 10/23- Day of procedure.  Will check INR prior to injection.

## 2011-04-09 ENCOUNTER — Encounter: Payer: Medicare Other | Admitting: *Deleted

## 2011-04-09 ENCOUNTER — Other Ambulatory Visit: Payer: Self-pay | Admitting: Internal Medicine

## 2011-04-09 LAB — POCT CARDIAC MARKERS
CKMB, poc: <1 — ABNORMAL LOW
Myoglobin, poc: 68.4
Operator id: 288831
Troponin i, poc: <0.05

## 2011-04-09 LAB — D-DIMER, QUANTITATIVE: D-Dimer, Quant: 0.83 — ABNORMAL HIGH

## 2011-04-09 LAB — DIFFERENTIAL
Basophils Absolute: 0
Eosinophils Relative: 7 — ABNORMAL HIGH
Lymphocytes Relative: 29
Lymphs Abs: 2.4
Neutro Abs: 4.5
Neutrophils Relative %: 54

## 2011-04-09 LAB — CBC
HCT: 35.5 — ABNORMAL LOW
Platelets: 304
RDW: 17.6 — ABNORMAL HIGH
WBC: 8.3

## 2011-04-09 LAB — BASIC METABOLIC PANEL
BUN: 13
Calcium: 8.8
GFR calc non Af Amer: >60
Potassium: 3.8

## 2011-04-09 NOTE — Telephone Encounter (Signed)
Done per emr 

## 2011-04-10 LAB — BASIC METABOLIC PANEL
BUN: 12
BUN: 15
CO2: 27
Chloride: 108
Creatinine, Ser: 1.02
GFR calc non Af Amer: 43 — ABNORMAL LOW
GFR calc non Af Amer: 52 — ABNORMAL LOW
Glucose, Bld: 91
Glucose, Bld: 95
Potassium: 4.5
Potassium: 4.5
Sodium: 138

## 2011-04-10 LAB — CBC
HCT: 31.1 — ABNORMAL LOW
HCT: 32.3 — ABNORMAL LOW
Hemoglobin: 10.3 — ABNORMAL LOW
MCV: 83.9
MCV: 84.3
Platelets: 228
Platelets: 274
RDW: 15.8 — ABNORMAL HIGH
RDW: 15.9 — ABNORMAL HIGH
WBC: 11.2 — ABNORMAL HIGH

## 2011-04-10 LAB — PROTIME-INR: Prothrombin Time: 24.9 — ABNORMAL HIGH

## 2011-04-13 ENCOUNTER — Other Ambulatory Visit: Payer: Self-pay | Admitting: Internal Medicine

## 2011-04-14 ENCOUNTER — Other Ambulatory Visit: Payer: Self-pay | Admitting: Internal Medicine

## 2011-04-15 ENCOUNTER — Ambulatory Visit (INDEPENDENT_AMBULATORY_CARE_PROVIDER_SITE_OTHER): Payer: Medicare Other | Admitting: *Deleted

## 2011-04-15 DIAGNOSIS — Z8679 Personal history of other diseases of the circulatory system: Secondary | ICD-10-CM

## 2011-04-15 DIAGNOSIS — I6350 Cerebral infarction due to unspecified occlusion or stenosis of unspecified cerebral artery: Secondary | ICD-10-CM

## 2011-04-15 DIAGNOSIS — I639 Cerebral infarction, unspecified: Secondary | ICD-10-CM

## 2011-04-15 DIAGNOSIS — I82409 Acute embolism and thrombosis of unspecified deep veins of unspecified lower extremity: Secondary | ICD-10-CM

## 2011-04-15 NOTE — Patient Instructions (Signed)
10/23- Take Coumadin 1 1/2 tablets 10/24- Lovenox 60mg  injection in AM and Coumadin 2 tablets 10/25- Lovenox 60mg  injection in AM and Coumadin 1 1/2 tablets  10/26- Recheck INR.

## 2011-04-18 ENCOUNTER — Ambulatory Visit (INDEPENDENT_AMBULATORY_CARE_PROVIDER_SITE_OTHER): Payer: Medicare Other | Admitting: *Deleted

## 2011-04-18 ENCOUNTER — Encounter: Payer: Self-pay | Admitting: Internal Medicine

## 2011-04-18 DIAGNOSIS — I639 Cerebral infarction, unspecified: Secondary | ICD-10-CM

## 2011-04-18 DIAGNOSIS — Z8679 Personal history of other diseases of the circulatory system: Secondary | ICD-10-CM

## 2011-04-18 DIAGNOSIS — I6350 Cerebral infarction due to unspecified occlusion or stenosis of unspecified cerebral artery: Secondary | ICD-10-CM

## 2011-04-18 DIAGNOSIS — I82409 Acute embolism and thrombosis of unspecified deep veins of unspecified lower extremity: Secondary | ICD-10-CM

## 2011-04-25 ENCOUNTER — Ambulatory Visit (INDEPENDENT_AMBULATORY_CARE_PROVIDER_SITE_OTHER): Payer: Medicare Other | Admitting: *Deleted

## 2011-04-25 DIAGNOSIS — I82409 Acute embolism and thrombosis of unspecified deep veins of unspecified lower extremity: Secondary | ICD-10-CM

## 2011-04-25 DIAGNOSIS — I6350 Cerebral infarction due to unspecified occlusion or stenosis of unspecified cerebral artery: Secondary | ICD-10-CM

## 2011-04-25 DIAGNOSIS — Z8679 Personal history of other diseases of the circulatory system: Secondary | ICD-10-CM

## 2011-04-25 DIAGNOSIS — I639 Cerebral infarction, unspecified: Secondary | ICD-10-CM

## 2011-04-25 LAB — POCT INR: INR: 2.1

## 2011-05-07 ENCOUNTER — Ambulatory Visit (INDEPENDENT_AMBULATORY_CARE_PROVIDER_SITE_OTHER): Payer: Medicare Other | Admitting: Internal Medicine

## 2011-05-07 ENCOUNTER — Other Ambulatory Visit (INDEPENDENT_AMBULATORY_CARE_PROVIDER_SITE_OTHER): Payer: Medicare Other

## 2011-05-07 ENCOUNTER — Other Ambulatory Visit: Payer: Self-pay | Admitting: Internal Medicine

## 2011-05-07 ENCOUNTER — Encounter: Payer: Self-pay | Admitting: Internal Medicine

## 2011-05-07 DIAGNOSIS — M79609 Pain in unspecified limb: Secondary | ICD-10-CM

## 2011-05-07 DIAGNOSIS — R5383 Other fatigue: Secondary | ICD-10-CM

## 2011-05-07 DIAGNOSIS — M79661 Pain in right lower leg: Secondary | ICD-10-CM

## 2011-05-07 DIAGNOSIS — R5381 Other malaise: Secondary | ICD-10-CM

## 2011-05-07 DIAGNOSIS — E039 Hypothyroidism, unspecified: Secondary | ICD-10-CM

## 2011-05-07 DIAGNOSIS — I10 Essential (primary) hypertension: Secondary | ICD-10-CM

## 2011-05-07 DIAGNOSIS — C50919 Malignant neoplasm of unspecified site of unspecified female breast: Secondary | ICD-10-CM

## 2011-05-07 MED ORDER — LEVOTHYROXINE SODIUM 88 MCG PO TABS: 88.0000 ug | ORAL_TABLET | Freq: Every day | ORAL | Status: DC

## 2011-05-07 MED ORDER — ACETAMINOPHEN 500 MG PO TABS: 500.0000 mg | ORAL_TABLET | Freq: Four times a day (QID) | ORAL | Status: DC | PRN

## 2011-05-07 NOTE — Assessment & Plan Note (Signed)
Chronic pain, multifactorial: History of DVT December 2008 right lower extremity - on chronic Coumadin - Arthritic changes with history of right total hip replacement May 2008 Status post vascular evaluation in January 2009 due to pain -not circulatory issue Ongoing orthopedic evaluation for sciatica; MRI thoracic and lumbar spine done September 2012 Intolerant of Percocet do to neuro side effects and confusion Poor candidate for tramadol due to seizure history, no NSAIDs due to ongoing anticoagulation recommended trial scheduled Tylenol, reassurance provided

## 2011-05-07 NOTE — Patient Instructions (Signed)
It was good to see you today. Medications reviewed, no changes at this time. Try tylenol 1 tab twice daily and as needed for pain and arthritis symptoms  Test(s) ordered today. Your results will be called to you after review (48-72hours after test completion). If any changes need to be made, you will be notified at that time. Please schedule followup in 4 months to monitor blood pressure and thyroid, call sooner if problems.

## 2011-05-07 NOTE — Progress Notes (Signed)
Subjective:    Patient ID: Jennifer Brennan, female    DOB: 1924-05-30, 77 y.o.   MRN: [id number]  HPI  Here for follow up - reviewed chronic medical issues:  hypertension - hx labile but the patient reports compliance with medication(s) as prescribed. Denies adverse side effects.  Glaucoma - follows with optho for same - severe dx with legal blindness resulting from same - no eye pain or new vision changes  Hypothyroid - reports decrease in dose x 30d following hosp for chest pain 01/2011 - now back on original dose - no skin or weight changes but feels cold. Would like to recheck at this time  Right lower extremity pain - chronic symptoms, known sciatica, arthritic changes, and history of DVT 2008 - working with sports med provider at ortho office for same [name]) - MRI 02/2011 T&L spine> degen changes, central stenosis and R scoli - intolerant of Percocet due to confusion  Past Medical History  Diagnosis Date  . Hypertension   . Hypothyroidism   . Seizure disorder   . History of cerebrovascular accident   . Encounter for long-term (current) use of other medications     coumdain therapy  . Severe stage glaucoma   . Depression with anxiety   . OAB (overactive bladder)   . ADENOCARCINOMA, LEFT BREAST 04/11/2010  . Arthritis   . BACK PAIN, LUMBAR, CHRONIC   . DVT     RLE  . GLAUCOMA   . OSTEOARTHRITIS, HANDS, BILATERAL   . OSTEOPENIA   . Retinal ischemia   . Sciatica of right side   . VITAMIN D DEFICIENCY dx 08/2008   Review of Systems Respiratory: Negative for cough and shortness of breath.   Cardiovascular: Negative for chest pain or palpitations.  Neurological: Negative for dizziness -no breakthrough seizure activity      Objective:   Physical Exam  BP 138/62  Pulse 59  Temp(Src) 98 F (36.7 C) (Oral)  Wt 154 lb (69.854 kg)  SpO2 95% Constitutional: She is overweight but appears well-developed and well-nourished. Sitting in WC; No distress. dtr x2 at  side Cardiovascular: Normal rate, regular rhythm and normal heart sounds.  No murmur heard. No BLE edema. Pulmonary/Chest: Effort normal and breath sounds normal. No respiratory distress. She has no wheezes. Neurological: She is alert and oriented to person, place, and time. No cranial nerve deficit. Coordination normal and MAE.  Mskel: Right lower extremity without deformity or effusion. No swelling. Full range of motion active and passive in knee ankle and toes. Skin: chronic RLE distal venous changes/darkening -no open lesions, but tender to touch from knee posterior calf, ankle and foot; Skin is warm and dry. No rash noted. No erythema.  Psychiatric: She has a normal mood and affect. Her behavior is normal. Judgment and thought content normal.   Lab Results  Component Value Date   WBC 4.8 01/30/2011   HGB 13.4 01/30/2011   HCT 39.3 01/30/2011   PLT 198 01/30/2011   CHOL 201* 01/23/2011   TRIG 112 01/23/2011   HDL 64 01/23/2011   ALT 19 01/30/2011   AST 23 01/30/2011   NA 142 01/30/2011   K 3.9 01/30/2011   CL 108 01/30/2011   CREATININE 1.18* 01/30/2011   BUN 15 01/30/2011   CO2 26 01/30/2011   TSH 0.27* 03/05/2011   INR 2.1 04/25/2011   HGBA1C 5.7 08/06/2007      Assessment & Plan:  See problem list. Medications and labs reviewed today.   Fatigue -  cold intolerance - recheck labs including iron now - reassurance provided

## 2011-05-07 NOTE — Assessment & Plan Note (Signed)
Dose reduced to x30d, now back Check and adjust as needed Lab Results  Component Value Date   TSH 0.27* 03/05/2011

## 2011-05-07 NOTE — Assessment & Plan Note (Signed)
BP Readings from Last 3 Encounters:  05/07/11 138/62  03/05/11 130/62  01/20/11 130/70   The current medical regimen is effective;  continue present plan and medications.

## 2011-05-12 ENCOUNTER — Encounter: Payer: Medicare Other | Admitting: *Deleted

## 2011-05-19 ENCOUNTER — Ambulatory Visit (INDEPENDENT_AMBULATORY_CARE_PROVIDER_SITE_OTHER): Payer: Medicare Other | Admitting: *Deleted

## 2011-05-19 DIAGNOSIS — I82409 Acute embolism and thrombosis of unspecified deep veins of unspecified lower extremity: Secondary | ICD-10-CM

## 2011-05-19 DIAGNOSIS — Z8679 Personal history of other diseases of the circulatory system: Secondary | ICD-10-CM

## 2011-05-26 ENCOUNTER — Ambulatory Visit: Payer: Medicare Other | Admitting: Internal Medicine

## 2011-06-10 ENCOUNTER — Ambulatory Visit (INDEPENDENT_AMBULATORY_CARE_PROVIDER_SITE_OTHER): Payer: Medicare Other | Admitting: *Deleted

## 2011-06-10 DIAGNOSIS — Z8679 Personal history of other diseases of the circulatory system: Secondary | ICD-10-CM

## 2011-06-10 DIAGNOSIS — I639 Cerebral infarction, unspecified: Secondary | ICD-10-CM

## 2011-06-10 DIAGNOSIS — I82409 Acute embolism and thrombosis of unspecified deep veins of unspecified lower extremity: Secondary | ICD-10-CM

## 2011-06-10 DIAGNOSIS — I6350 Cerebral infarction due to unspecified occlusion or stenosis of unspecified cerebral artery: Secondary | ICD-10-CM

## 2011-06-10 LAB — POCT INR: INR: 2.2

## 2011-07-02 ENCOUNTER — Other Ambulatory Visit: Payer: Self-pay | Admitting: *Deleted

## 2011-07-02 DIAGNOSIS — C50919 Malignant neoplasm of unspecified site of unspecified female breast: Secondary | ICD-10-CM

## 2011-07-02 DIAGNOSIS — C44509 Unspecified malignant neoplasm of skin of other part of trunk: Secondary | ICD-10-CM

## 2011-07-02 DIAGNOSIS — C44599 Other specified malignant neoplasm of skin of other part of trunk: Secondary | ICD-10-CM

## 2011-07-02 MED ORDER — LETROZOLE 2.5 MG PO TABS: 2.5000 mg | ORAL_TABLET | Freq: Every evening | ORAL | Status: DC

## 2011-07-08 ENCOUNTER — Encounter: Payer: Medicare Other | Admitting: *Deleted

## 2011-07-09 ENCOUNTER — Ambulatory Visit (INDEPENDENT_AMBULATORY_CARE_PROVIDER_SITE_OTHER): Payer: Medicare Other | Admitting: *Deleted

## 2011-07-09 DIAGNOSIS — Z8679 Personal history of other diseases of the circulatory system: Secondary | ICD-10-CM

## 2011-07-09 DIAGNOSIS — I6350 Cerebral infarction due to unspecified occlusion or stenosis of unspecified cerebral artery: Secondary | ICD-10-CM

## 2011-07-09 DIAGNOSIS — I82409 Acute embolism and thrombosis of unspecified deep veins of unspecified lower extremity: Secondary | ICD-10-CM

## 2011-07-09 DIAGNOSIS — I639 Cerebral infarction, unspecified: Secondary | ICD-10-CM

## 2011-07-09 LAB — POCT INR: INR: 3.1

## 2011-07-24 ENCOUNTER — Other Ambulatory Visit: Payer: Medicare Other | Admitting: Lab

## 2011-07-30 ENCOUNTER — Encounter: Payer: Medicare Other | Admitting: *Deleted

## 2011-07-30 ENCOUNTER — Ambulatory Visit (INDEPENDENT_AMBULATORY_CARE_PROVIDER_SITE_OTHER): Payer: Medicare Other | Admitting: *Deleted

## 2011-07-30 DIAGNOSIS — I639 Cerebral infarction, unspecified: Secondary | ICD-10-CM

## 2011-07-30 DIAGNOSIS — Z8679 Personal history of other diseases of the circulatory system: Secondary | ICD-10-CM

## 2011-07-30 DIAGNOSIS — I6350 Cerebral infarction due to unspecified occlusion or stenosis of unspecified cerebral artery: Secondary | ICD-10-CM

## 2011-07-30 DIAGNOSIS — I82409 Acute embolism and thrombosis of unspecified deep veins of unspecified lower extremity: Secondary | ICD-10-CM

## 2011-08-11 ENCOUNTER — Other Ambulatory Visit: Payer: Self-pay | Admitting: Internal Medicine

## 2011-08-19 ENCOUNTER — Emergency Department (HOSPITAL_COMMUNITY)
Admission: EM | Admit: 2011-08-19 | Discharge: 2011-08-19 | Disposition: A | Discharge status: Home / Self Care | Payer: Medicare Other | Attending: Emergency Medicine | Admitting: Emergency Medicine

## 2011-08-19 ENCOUNTER — Encounter (HOSPITAL_COMMUNITY): Payer: Self-pay | Admitting: Emergency Medicine

## 2011-08-19 ENCOUNTER — Emergency Department (HOSPITAL_COMMUNITY): Payer: Medicare Other

## 2011-08-19 ENCOUNTER — Telehealth: Payer: Self-pay

## 2011-08-19 ENCOUNTER — Other Ambulatory Visit: Payer: Self-pay

## 2011-08-19 DIAGNOSIS — R413 Other amnesia: Secondary | ICD-10-CM | POA: Insufficient documentation

## 2011-08-19 DIAGNOSIS — R569 Unspecified convulsions: Secondary | ICD-10-CM

## 2011-08-19 DIAGNOSIS — H548 Legal blindness, as defined in USA: Secondary | ICD-10-CM | POA: Insufficient documentation

## 2011-08-19 DIAGNOSIS — M549 Dorsalgia, unspecified: Secondary | ICD-10-CM

## 2011-08-19 DIAGNOSIS — R404 Transient alteration of awareness: Secondary | ICD-10-CM | POA: Insufficient documentation

## 2011-08-19 DIAGNOSIS — Z86718 Personal history of other venous thrombosis and embolism: Secondary | ICD-10-CM | POA: Insufficient documentation

## 2011-08-19 DIAGNOSIS — E039 Hypothyroidism, unspecified: Secondary | ICD-10-CM | POA: Insufficient documentation

## 2011-08-19 DIAGNOSIS — Z7901 Long term (current) use of anticoagulants: Secondary | ICD-10-CM | POA: Insufficient documentation

## 2011-08-19 DIAGNOSIS — I10 Essential (primary) hypertension: Secondary | ICD-10-CM | POA: Insufficient documentation

## 2011-08-19 LAB — CBC
HCT: 42.9 % (ref 36.0–46.0)
Hemoglobin: 14.2 g/dL (ref 12.0–15.0)
MCH: 26.7 pg (ref 26.0–34.0)
MCHC: 33.1 g/dL (ref 30.0–36.0)
MCV: 80.6 fL (ref 78.0–100.0)
Platelets: 165 10*3/uL (ref 150–400)
RBC: 5.32 MIL/uL — ABNORMAL HIGH (ref 3.87–5.11)
RDW: 15.4 % (ref 11.5–15.5)
WBC: 6.5 10*3/uL (ref 4.0–10.5)

## 2011-08-19 LAB — URINALYSIS, ROUTINE W REFLEX MICROSCOPIC
Bilirubin Urine: NEGATIVE
Glucose, UA: NEGATIVE mg/dL
Hgb urine dipstick: NEGATIVE
Ketones, ur: NEGATIVE mg/dL
Leukocytes, UA: NEGATIVE
Nitrite: NEGATIVE
Protein, ur: NEGATIVE mg/dL
Specific Gravity, Urine: 1.01 (ref 1.005–1.030)
Urobilinogen, UA: 0.2 mg/dL (ref 0.0–1.0)
pH: 7.5 (ref 5.0–8.0)

## 2011-08-19 LAB — COMPREHENSIVE METABOLIC PANEL
ALT: 16 U/L (ref 0–35)
AST: 21 U/L (ref 0–37)
Alkaline Phosphatase: 61 U/L (ref 39–117)
CO2: 25 mEq/L (ref 19–32)
Calcium: 9.9 mg/dL (ref 8.4–10.5)
GFR calc non Af Amer: 44 mL/min — ABNORMAL LOW (ref >90)
Potassium: 4.5 mEq/L (ref 3.5–5.1)
Sodium: 139 mEq/L (ref 135–145)
Total Protein: 7.7 g/dL (ref 6.0–8.3)

## 2011-08-19 LAB — DIFFERENTIAL
Basophils Absolute: 0 10*3/uL (ref 0.0–0.1)
Basophils Relative: 0 % (ref 0–1)
Eosinophils Absolute: 0.3 K/uL (ref 0.0–0.7)
Eosinophils Relative: 4 % (ref 0–5)
Lymphocytes Relative: 34 % (ref 12–46)
Lymphs Abs: 2.2 10*3/uL (ref 0.7–4.0)
Monocytes Absolute: 0.4 K/uL (ref 0.1–1.0)
Monocytes Relative: 6 % (ref 3–12)
Neutro Abs: 3.7 K/uL (ref 1.7–7.7)
Neutrophils Relative %: 56 % (ref 43–77)

## 2011-08-19 LAB — PROTIME-INR
INR: 2.6 — ABNORMAL HIGH (ref 0.00–1.49)
Prothrombin Time: 28.3 s — ABNORMAL HIGH (ref 11.6–15.2)

## 2011-08-19 LAB — COMPREHENSIVE METABOLIC PANEL WITH GFR
Albumin: 3.6 g/dL (ref 3.5–5.2)
BUN: 14 mg/dL (ref 6–23)
Chloride: 105 meq/L (ref 96–112)
Creatinine, Ser: 1.11 mg/dL — ABNORMAL HIGH (ref 0.50–1.10)
GFR calc Af Amer: 51 mL/min — ABNORMAL LOW (ref >90)
Glucose, Bld: 103 mg/dL — ABNORMAL HIGH (ref 70–99)
Total Bilirubin: 0.3 mg/dL (ref 0.3–1.2)

## 2011-08-19 LAB — SAMPLE TO BLOOD BANK

## 2011-08-19 LAB — APTT: aPTT: 46 seconds — ABNORMAL HIGH (ref 24–37)

## 2011-08-19 MED ORDER — FENTANYL 12 MCG/HR TD PT72: 1.0000 | MEDICATED_PATCH | TRANSDERMAL | Status: DC

## 2011-08-19 MED ORDER — FENTANYL CITRATE 0.05 MG/ML IJ SOLN
50.0000 ug | Freq: Once | INTRAMUSCULAR | Status: AC
  Administered 2011-08-19: 50 ug via INTRAVENOUS
  Filled 2011-08-19: qty 2

## 2011-08-19 NOTE — ED Notes (Signed)
Took patient with CNA [place]  To the bathroom on the steady and assist the patient with getting dress

## 2011-08-19 NOTE — Telephone Encounter (Signed)
Pt's daughter called stating that pt is c/o back pain, so bad that she is unable to get out of bed. Daughter gave her 2 tylenol at 8 am this morning but is wondering if they could give pt Hydrocodone 5-325 in addition since palin tylenol did not help, please advise.

## 2011-08-19 NOTE — Telephone Encounter (Signed)
It appears pt has been working with Dr [name] at [facility] (ortho) for her back pain problems - i would advise pt to schedule follow up there (or here) to review change in pain symptoms - in meanwhile ok to take additional pain med as requested - but please note: our med list shows oxy 5/325, not hydrocodone 5/325 - i will not send any new rx at this time presuming pt already has supply at home - if new rx is needed, will need to clarify what med is being taken and who has rx'd prior meds - thanks

## 2011-08-19 NOTE — Telephone Encounter (Signed)
Pt's daughter advised of MD's recommendation and will contact [facility] for further advisement. Daughter also clarified that pt is taking Oxycodone 5-325.

## 2011-08-19 NOTE — ED Notes (Signed)
Per EMS, family states pt became diaphoretic/quiet/eyes rolled back-family states this usually occurs with a seizure-

## 2011-08-19 NOTE — ED Provider Notes (Signed)
History     CSN: [id number]  Arrival date & time 08/19/11  1522   First MD Initiated Contact with Patient 08/19/11 1646      Chief Complaint  Patient presents with  . Seizures    (Consider location/radiation/quality/duration/timing/severity/associated sxs/prior treatment) HPI Comments: Level 5 caveat due to post ictal state and altered mentation.  Patient with prior history of seizures many years ago following a cerebral hemorrhage. This occurred approximately 30 years ago. Patient takes Neurontin currently, unsure if it is for seizure prevention or not. Patient also has a history of DVT of her legs and is currently on Coumadin. She does have a history of some chronic back problems and since yesterday she's had more symptoms of upper back pain with radiation into her shoulders upper extremities. She does have a history of arthritis but patient is unsure of this is related to arthritis or not. She denies any new trauma. She reports feeling very cold and chilled over the last few days, but no other history or symptoms or signs of infection. Specifically she denies sinus problems, rhinorrhea, sore throat, cough, vomiting or diarrhea. Patient's family member had applied a heating packs to her back area which was helping her discomfort, however earlier this afternoon she complained of feeling very hot, became diaphoretic last night as well and again today and felt that she was not feeling right. Then the patient had a tonic generalized seizure that lasted a couple of minutes. The patient was postictal according to family. Here in the emergency department, the patient denies headache, denies any oral trauma and had no urinary or bowel incontinence. Other than her back pain, she denies any chest pain, shortness of breath currently. Apparently the patient did have an episode of shortness of breath last night but apparently has resolved. That episode was not associated with chest pain or nausea or vomiting. It  is unclear when she had her diaphoretic episode last night. She did take a Benadryl as well as oxycodone last night in order to help her sleep due to her back problems. She has taken these medications in combination in the past without problems. She takes the Benadryl because of narcotic seem to cause her to itch. Otherwise no new medications. Patient is brought here by EMS .  Patient is a 78 y.o. female presenting with seizures. The history is provided by the patient, the spouse and a relative.  Seizures     Past Medical History  Diagnosis Date  . Hypertension   . Hypothyroidism   . Seizure disorder   . History of cerebrovascular accident   . Severe stage glaucoma     legally blind  . Depression with anxiety   . OAB (overactive bladder)   . ADENOCARCINOMA, LEFT BREAST 04/11/2010    s/p L mastectomy, on femara  . BACK PAIN, LUMBAR, CHRONIC   . DVT 05/2007    RLE following R THR - chronic coumadin, chronic RLE pain  . GLAUCOMA   . OSTEOARTHRITIS, HANDS, BILATERAL   . OSTEOPENIA   . Retinal ischemia   . Sciatica of right side     chronic RLE pain, multifactorial - MRI T/L spine 02/2011  . VITAMIN D DEFICIENCY dx 08/2008    Past Surgical History  Procedure Date  . Total abdominal hysterectomy   . Total hip arthroplasty 10/2006    right hip  . Refractive surgery     B/L  . Breast surgery 03/2010    breast biopsy, L mastectomy   .  Tonsillectomy     Family History  Problem Relation Age of Onset  . Ovarian cancer Mother   . Arthritis Other     grandparents  . Lung cancer Other   . Heart disease Other     parent    History  Substance Use Topics  . Smoking status: Former Games developer  . Smokeless tobacco: Not on file  . Alcohol Use: No    OB History    Grav Para Term Preterm Abortions TAB SAB Ect Mult Living                  Review of Systems  Unable to perform ROS: Other  Neurological: Positive for seizures.    Allergies  Morphine sulfate; Penicillins; and  Sertraline hcl  Home Medications   Current Outpatient Rx  Name Route Sig Dispense Refill  . ACETAMINOPHEN 500 MG PO TABS Oral Take 1 tablet (500 mg total) by mouth every 6 (six) hours as needed for pain. 100 tablet 2  . BRIMONIDINE TARTRATE 0.15 % OP SOLN Left Eye Place 1 drop into the left eye every 12 (twelve) hours.     . CHOLECALCIFEROL 1000 UNITS PO TABS Oral Take 1,000 Units by mouth daily.      Marland Kitchen DIOVAN 80 MG PO TABS  take 1 tablet by mouth once daily 30 tablet 5  . DOCUSATE SODIUM 100 MG PO CAPS Oral Take 100 mg by mouth daily.      . DORZOLAMIDE HCL-TIMOLOL MAL 22.3-6.8 MG/ML OP SOLN Both Eyes Place 1 drop into both eyes 2 (two) times daily.      Marland Kitchen LATANOPROST 0.005 % OP SOLN Both Eyes Place 1 drop into both eyes at bedtime.      Marland Kitchen LETROZOLE 2.5 MG PO TABS Oral Take 1 tablet (2.5 mg total) by mouth every evening. 30 tablet 2  . LEVOTHYROXINE SODIUM 88 MCG PO TABS Oral Take 1 tablet (88 mcg total) by mouth daily. 30 tablet 6  . NIFEDICAL XL 30 MG PO TB24  take 1 tablet by mouth once daily 30 tablet 5  . OMEPRAZOLE 20 MG PO CPDR Oral Take 1 capsule (20 mg total) by mouth 2 (two) times daily. 60 capsule 6  . OXYCODONE-ACETAMINOPHEN 5-325 MG PO TABS  Take as needed    . VESICARE 5 MG PO TABS  take 1 tablet by mouth once daily 30 tablet 5  . WARFARIN SODIUM 5 MG PO TABS Oral Take 5 mg by mouth daily.       BP 158/67  Pulse 60  Temp(Src) 97.6 F (36.4 C) (Oral)  Resp 16  SpO2 100%  Physical Exam  Nursing note and vitals reviewed. Constitutional: Vital signs are normal. She appears well-developed. She is cooperative.  Non-toxic appearance.  HENT:  Head: Normocephalic and atraumatic.  Eyes: Pupils are equal, round, and reactive to light. No scleral icterus.  Neck: Normal range of motion and full passive range of motion without pain. Neck supple. No rigidity. Normal range of motion present. No Brudzinski's sign and no Kernig's sign noted.  Cardiovascular: Normal rate, regular  rhythm, S1 normal and S2 normal.   Pulmonary/Chest: Effort normal and breath sounds normal. No respiratory distress. She has no decreased breath sounds. She has no wheezes.  Abdominal: Soft. Normal appearance and bowel sounds are normal. There is no tenderness.  Neurological: She is alert. She has normal strength. No cranial nerve deficit. GCS eye subscore is 4. GCS verbal subscore is 4. GCS motor subscore is  6.  Reflex Scores:      Patellar reflexes are 1+ on the right side and 1+ on the left side. Psychiatric: Her speech is normal and behavior is normal. Her mood appears not anxious. Her affect is not angry, not blunt and not labile. Cognition and memory are impaired. She does not exhibit a depressed mood. She exhibits abnormal recent memory. She exhibits normal remote memory.    ED Course  Procedures (including critical care time)  Labs Reviewed  CBC - Abnormal; Notable for the following:    RBC 5.32 (*)    All other components within normal limits  COMPREHENSIVE METABOLIC PANEL - Abnormal; Notable for the following:    Glucose, Bld 103 (*)    Creatinine, Ser 1.11 (*)    GFR calc non Af Amer 44 (*)    GFR calc Af Amer 51 (*)    All other components within normal limits  APTT - Abnormal; Notable for the following:    aPTT 46 (*)    All other components within normal limits  PROTIME-INR - Abnormal; Notable for the following:    Prothrombin Time 28.3 (*)    INR 2.60 (*)    All other components within normal limits  DIFFERENTIAL  URINALYSIS, ROUTINE W REFLEX MICROSCOPIC  SAMPLE TO BLOOD BANK   Ct Head Wo Contrast  08/19/2011  *RADIOLOGY REPORT*  Clinical Data: Seizure.  Altered level of consciousness.  CT HEAD WITHOUT CONTRAST  Technique:  Contiguous axial images were obtained from the base of the skull through the vertex without contrast.  Comparison: Brain MRI on 04/30/2009  Findings: There is no evidence of intracranial hemorrhage, brain edema or other signs of acute infarction.   There is no evidence of intracranial mass lesion or mass effect.  No abnormal extra-axial fluid collections are identified.  Old bilateral parietooccipital infarcts are again seen, with ex vacuo dilatation of the occipital horns of the lateral ventricles. Ventriculomegaly is stable.  No skull abnormality identified.  IMPRESSION:  1.  No acute intracranial abnormality. 2.  Stable old bilateral parietooccipital infarcts and ventriculomegaly.  Original Report Authenticated By: Danae Orleans, M.D.     1. Back pain   2. Seizure     Sat is 97% which is normal.  ECG at time 17:31, shows normal sinus rhythm at rate of 61. Prolonged PR interval at 240 ms, shows first degree AV block. Normal axis. No ST or T-wave abnormalities. Prolonged duration of PR interval is changed compared to EKG from 01/22/2011.  MDM   With her history of hypertension, being on Coumadin and history of seizures following cerebral hemorrhage, plan will be to obtain head CT scan. Also recheck a rectal temperature to ensure that she is not currently feverish. Her blood pressure is mildly elevated but likely not in the range where it would be causing hypertensive urgency. She is slightly disoriented currently, however I think this may be due to a post ictal state. Patient is requesting some analgesics for her back pain and I'll give her some IV fentanyl. Otherwise we'll get routine blood tests, electrolytes and a urinalysis. I will check a INR given she is on Coumadin.   6:46 PM WBC is not elevated, head CT scan which I personally reviewed, per radiologist shows no acute changes.       8:30 PM Pt ate, ambulated well.  I spoke to Dr. Everardo All adn discussed her symptoms. He will let Dr. Felicity Coyer know and pt and family are ok going home.  She did very well with fentanyl so will provide a patch for her, lowest dose.    Gavin Pound. Oletta Lamas, MD 08/19/11 2036

## 2011-08-19 NOTE — Discharge Instructions (Signed)
Back Pain, Adult Low back pain is very common. About 1 in 5 people have back pain.The cause of low back pain is rarely dangerous. The pain often gets better over time.About half of people with a sudden onset of back pain feel better in just 2 weeks. About 8 in 10 people feel better by 6 weeks.  CAUSES Some common causes of back pain include:  Strain of the muscles or ligaments supporting the spine.   Wear and tear (degeneration) of the spinal discs.   Arthritis.   Direct injury to the back.  DIAGNOSIS Most of the time, the direct cause of low back pain is not known.However, back pain can be treated effectively even when the exact cause of the pain is unknown.Answering your caregiver's questions about your overall health and symptoms is one of the most accurate ways to make sure the cause of your pain is not dangerous. If your caregiver needs more information, he or she may order lab work or imaging tests (X-rays or MRIs).However, even if imaging tests show changes in your back, this usually does not require surgery. HOME CARE INSTRUCTIONS For many people, back pain returns.Since low back pain is rarely dangerous, it is often a condition that people can learn to manageon their own.   Remain active. It is stressful on the back to sit or stand in one place. Do not sit, drive, or stand in one place for more than 30 minutes at a time. Take short walks on level surfaces as soon as pain allows.Try to increase the length of time you walk each day.   Do not stay in bed.Resting more than 1 or 2 days can delay your recovery.   Do not avoid exercise or work.Your body is made to move.It is not dangerous to be active, even though your back may hurt.Your back will likely heal faster if you return to being active before your pain is gone.   Pay attention to your body when you bend and lift. Many people have less discomfortwhen lifting if they bend their knees, keep the load close to their  bodies,and avoid twisting. Often, the most comfortable positions are those that put less stress on your recovering back.   Find a comfortable position to sleep. Use a firm mattress and lie on your side with your knees slightly bent. If you lie on your back, put a pillow under your knees.   Only take over-the-counter or prescription medicines as directed by your caregiver. Over-the-counter medicines to reduce pain and inflammation are often the most helpful.Your caregiver may prescribe muscle relaxant drugs.These medicines help dull your pain so you can more quickly return to your normal activities and healthy exercise.   Put ice on the injured area.   Put ice in a plastic bag.   Place a towel between your skin and the bag.   Leave the ice on for 15 to 20 minutes, 3 to 4 times a day for the first 2 to 3 days. After that, ice and heat may be alternated to reduce pain and spasms.   Ask your caregiver about trying back exercises and gentle massage. This may be of some benefit.   Avoid feeling anxious or stressed.Stress increases muscle tension and can worsen back pain.It is important to recognize when you are anxious or stressed and learn ways to manage it.Exercise is a great option.  SEEK MEDICAL CARE IF:  You have pain that is not relieved with rest or medicine.   You have   pain that does not improve in 1 week.   You have new symptoms.   You are generally not feeling well.  SEEK IMMEDIATE MEDICAL CARE IF:   You have pain that radiates from your back into your legs.   You develop new bowel or bladder control problems.   You have unusual weakness or numbness in your arms or legs.   You develop nausea or vomiting.   You develop abdominal pain.   You feel faint.  Document Released: 06/09/2005 Document Revised: 02/19/2011 Document Reviewed: 10/28/2010 [facility] Patient Information 2012 [place], [place].    Seizures You had a seizure. About 2% of the population will have a  seizure problem during their lifetime. Sometimes the cause for the seizure is not known. Seizures are usually associated with one of these problems:  Epilepsy.   Not taking your seizure medicine.   Alcohol and drug abuse.   Head injury, strokes, tumors, and brain surgery.   High fever and infections.   Low blood sugar.  Evaluating a new seizure disorder may require having a brain scan or a brain wave test called an EEG. If you have been given a seizure medicine, it is very important that you take it as prescribed. Not taking these medicines as directed is the most common cause of seizures. Blood tests are often used to be sure you are taking the proper dose.  Seizures cause many different symptoms, from convulsions to brief blackouts. Do not ride a bike, drive a car, go swimming, climb in high or dangerous places such as ladders or roofs, or operate any dangerous equipment until you have your doctor's permission. If you hold a driver's license, state law may require that a report be made to the motor vehicles department. You should wear an emergency medical identification bracelet with information about your seizures. If you have any warning that a seizure may occur, lie down in a safe place to protect yourself. Teach your family and friends what to do if you have any further seizures. They should stay calm and try to keep you from falling on hard or sharp objects. It is best not to try to restrain a seizing person or to force anything into his or her mouth. Do not try to open clenched jaws. When the seizure is over, the person should be rolled on their side to help drain any vomit or secretions from the mouth. After a seizure, a person may be confused or drowsy for several minutes. An ambulance should be called if the seizure lasted more than 5 minutes or if confusion remains for more than 30 minutes. Call your caregiver or the emergency department for further instructions. Do not drive until  cleared by your caregiver or neurologist! Document Released: 07/17/2004 Document Revised: 02/19/2011 Document Reviewed: 06/09/2005 [facility] Patient Information 2012 [place], [place].     Try the Duragesic patch which delivers a low dose of fentanyl at a steady rate.  Be very cautious and monitor her carefully since this is a new medication.  If it is effective, please let your doctor know that it is a new medicine that I have started her on.

## 2011-08-19 NOTE — ED Notes (Signed)
Unable to get rectal temp at this time-pt tensed up-will attempt at later time

## 2011-08-19 NOTE — ED Notes (Signed)
[id number]<AV> Expected date:08/19/11<BR> Expected time: 3:10 PM<BR> Means of arrival:Ambulance<BR> Comments:<BR> Seizure

## 2011-08-20 ENCOUNTER — Telehealth: Payer: Self-pay | Admitting: Endocrinology

## 2011-08-20 DIAGNOSIS — R569 Unspecified convulsions: Secondary | ICD-10-CM

## 2011-08-20 NOTE — Telephone Encounter (Signed)
Dr Oletta Lamas called last night.  Pt was seen in er with seizure.  Pt and family declined admission.  Needs f/u (? neurol consult)

## 2011-08-20 NOTE — Telephone Encounter (Signed)
Left message on machine for pt to return my call  

## 2011-08-20 NOTE — Telephone Encounter (Signed)
Noted - i have made refer to neuro - please let family know same and encourage them to arrange ROV with me as well- thanks

## 2011-08-21 NOTE — Telephone Encounter (Signed)
Left message on machine for pt to return my call  

## 2011-08-25 ENCOUNTER — Other Ambulatory Visit: Payer: Medicare Other | Admitting: Lab

## 2011-08-27 ENCOUNTER — Ambulatory Visit (INDEPENDENT_AMBULATORY_CARE_PROVIDER_SITE_OTHER): Payer: Medicare Other | Admitting: *Deleted

## 2011-08-27 DIAGNOSIS — I639 Cerebral infarction, unspecified: Secondary | ICD-10-CM

## 2011-08-27 DIAGNOSIS — I6350 Cerebral infarction due to unspecified occlusion or stenosis of unspecified cerebral artery: Secondary | ICD-10-CM

## 2011-08-27 DIAGNOSIS — I82409 Acute embolism and thrombosis of unspecified deep veins of unspecified lower extremity: Secondary | ICD-10-CM

## 2011-08-27 LAB — POCT INR: INR: 2.8

## 2011-09-01 ENCOUNTER — Ambulatory Visit: Payer: Medicare Other | Admitting: Physician Assistant

## 2011-09-03 ENCOUNTER — Ambulatory Visit: Payer: Medicare Other | Admitting: Internal Medicine

## 2011-09-03 ENCOUNTER — Ambulatory Visit (HOSPITAL_BASED_OUTPATIENT_CLINIC_OR_DEPARTMENT_OTHER): Payer: Medicare Other | Admitting: Physician Assistant

## 2011-09-03 ENCOUNTER — Encounter: Payer: Self-pay | Admitting: Physician Assistant

## 2011-09-03 VITALS — BP 181/75 | HR 54 | Temp 98.3°F | Ht 60.0 in | Wt 164.7 lb

## 2011-09-03 DIAGNOSIS — C44599 Other specified malignant neoplasm of skin of other part of trunk: Secondary | ICD-10-CM

## 2011-09-03 DIAGNOSIS — Z1231 Encounter for screening mammogram for malignant neoplasm of breast: Secondary | ICD-10-CM

## 2011-09-03 DIAGNOSIS — C50919 Malignant neoplasm of unspecified site of unspecified female breast: Secondary | ICD-10-CM

## 2011-09-03 DIAGNOSIS — Z853 Personal history of malignant neoplasm of breast: Secondary | ICD-10-CM

## 2011-09-03 DIAGNOSIS — Z170 Estrogen receptor positive status [ER+]: Secondary | ICD-10-CM

## 2011-09-03 DIAGNOSIS — E559 Vitamin D deficiency, unspecified: Secondary | ICD-10-CM

## 2011-09-03 DIAGNOSIS — Z9010 Acquired absence of unspecified breast and nipple: Secondary | ICD-10-CM

## 2011-09-03 DIAGNOSIS — Z79811 Long term (current) use of aromatase inhibitors: Secondary | ICD-10-CM

## 2011-09-03 MED ORDER — LETROZOLE 2.5 MG PO TABS: 2.5000 mg | ORAL_TABLET | Freq: Every evening | ORAL | Status: DC

## 2011-09-03 NOTE — Progress Notes (Signed)
ID: Jennifer Brennan   DOB: [birth year]  MR#: [id number]  CSN#:[id number]  HISTORY OF PRESENT ILLNESS: The patient had some pain in her right breast associated with a palpable abnormality.  She brought this to Jennifer Brennan attention and he set her up for bilateral diagnostic mammography and left ultrasonography which was performed October 11th, 2011 at [facility].  Dr. Manson Passey found a spiculated mass in the upper inner quadrant of the left breast which was palpable and associated with skin dimpling.  Ultrasound shows a hypoechoic mass with acoustic shadowing, spiculated, measuring 2.0 cm.  The left axilla showed no enlarged lymph nodes.  Biopsy was performed the same day and showed (invasive ductal carcinoma, Grade 2, with some scirrhous changes.  The tumor was estrogen 97% positive, progesterone 64% positive with a proliferative marker of 18% and no amplification of Her-2 by CISH with a ratio of 0.94.   With this information, the patient was referred to Jennifer Brennan, and she was felt not to be an MRI candidate because of her multiple medical problems as listed below.  The case was presented at the Multidisciplinary Breast Cancer Conference on October 19th and the consensus there was that the patient was a good candidate for breast conserving surgery followed by hormones.    Accordingly, the patient is status post left mastectomy and sentinel lymph node sampling in November of 2011 for a T2 N0, grade 3 invasive ductal carcinoma. Tumor was strongly ER and PR positive, HER-2/neu negative, with a borderline MIP-1. She began letrozole in May of 2012, 2.5 mg daily with good tolerance.  INTERVAL HISTORY: Jennifer Brennan returns today accompanied by her 2 daughters and her husband. Interval history is remarkable for recent hospitalization for seizure activity. She is already scheduled for followup with her primary care physician, Dr. Felicity Brennan, and also for appointment with the neurologist.  [name] biggest  complaint today is continued pain in the back in the hips bilaterally. This is followed by Dr. Norlene Brennan, and her daughter Jennifer Brennan tells me she actually has an appointment there for her an injection in the next few weeks. In the meanwhile she continues on fentanyl patches. She is tolerating them well, but thinks they may be causing some night sweats.  [name] is tolerating the letrozole well. She does have occasional hot flashes. Sometimes she feels very cold, and has a hard time getting her hands and feet warm. She attributes this to the letrozole.   A detailed review of systems is otherwise noncontributory as noted below.  Review of Systems: Constitutional:  Weakness, night sweats, hot flashes, no fever or chills Eyes: negative WNU:[id number] Cardiovascular: no chest pain or dyspnea on exertion Respiratory: no cough, shortness of breath, or wheezing Neurological: positive for - recent seizure activity Dermatological: negative Gastrointestinal: no abdominal pain, nausea, change in bowel habits, or black or bloody stools Genito-Urinary: no dysuria, trouble voiding, or hematuria Hematological and Lymphatic: negative Breast: negative Musculoskeletal: positive for - joint stiffness and pain in back - generalized and hip - both Remaining ROS negative.    PAST MEDICAL HISTORY: Past Medical History  Diagnosis Date  . Hypertension   . Hypothyroidism   . Seizure disorder   . History of cerebrovascular accident   . Severe stage glaucoma     legally blind  . Depression with anxiety   . OAB (overactive bladder)   . ADENOCARCINOMA, LEFT BREAST 04/11/2010    s/p L mastectomy, on femara  . BACK PAIN, LUMBAR, CHRONIC   .  DVT 05/2007    RLE following R THR - chronic coumadin, chronic RLE pain  . GLAUCOMA   . OSTEOARTHRITIS, HANDS, BILATERAL   . OSTEOPENIA   . Retinal ischemia   . Sciatica of right side     chronic RLE pain, multifactorial - MRI T/L spine 02/2011  . VITAMIN D  DEFICIENCY dx 08/2008    PAST SURGICAL HISTORY: Past Surgical History  Procedure Date  . Total abdominal hysterectomy   . Total hip arthroplasty 10/2006    right hip  . Refractive surgery     B/L  . Breast surgery 03/2010    breast biopsy, L mastectomy   . Tonsillectomy     FAMILY HISTORY Family History  Problem Relation Age of Onset  . Ovarian cancer Mother   . Arthritis Other     grandparents  . Lung cancer Other   . Heart disease Other     parent    GYN HISTORY:  She is GX P4.  First pregnancy to term at age 61.  She went through the change of life at the age of 61 and did take hormones for several years but she cannot recall how many.  SOCIAL HISTORY:  The patient's husband of 43 years, Jennifer Brennan, used to work as a Facilities manager in a children's program but now "I am her caretaker."  He has some hearing loss.  Daughter Jennifer Brennan lives in [place], is retired.   Daughter Jennifer Brennan also lives in [place].  She is disabled secondary to rheumatoid arthritis.   Daughter Jennifer Brennan lives in [place].  She is disabled secondary to sickle cell disease. The patient is a Systems analyst.  She has a Living Will in place although I do not have a copy of it.   ADVANCED DIRECTIVES:  HEALTH MAINTENANCE: History  Substance Use Topics  . Smoking status: Former Games developer  . Smokeless tobacco: Not on file  . Alcohol Use: No     Colonoscopy:  PAP:  Bone density:  Lipid panel:  Allergies  Allergen Reactions  . Morphine Sulfate     REACTION: rash: ITCHING  . Penicillins     REACTION: urticaria (hives)  . Sertraline Hcl     REACTION: rash    Current Outpatient Prescriptions  Medication Sig Dispense Refill  . acetaminophen (TYLENOL) 500 MG tablet Take 1 tablet (500 mg total) by mouth every 6 (six) hours as needed for pain.  100 tablet  2  . brimonidine (ALPHAGAN) 0.15 % ophthalmic solution Place 1 drop into the left eye every 12 (twelve) hours.       . Cholecalciferol 1000  UNITS tablet Take 1,000 Units by mouth daily.        Marland Kitchen DIOVAN 80 MG tablet take 1 tablet by mouth once daily  30 tablet  5  . docusate sodium (COLACE) 100 MG capsule Take 100 mg by mouth daily.        . dorzolamide-timolol (COSOPT) 22.3-6.8 MG/ML ophthalmic solution Place 1 drop into both eyes 2 (two) times daily.        . fentaNYL (DURAGESIC) 12 MCG/HR Place 1 patch (12.5 mcg total) onto the skin every 3 (three) days.  2 patch  0  . FLUOCINOLONE ACETONIDE SCALP 0.01 % external oil       . gabapentin (NEURONTIN) 300 MG capsule       . latanoprost (XALATAN) 0.005 % ophthalmic solution Place 1 drop into both eyes at bedtime.        Marland Kitchen  letrozole (FEMARA) 2.5 MG tablet Take 1 tablet (2.5 mg total) by mouth every evening.  30 tablet  11  . levothyroxine (SYNTHROID) 88 MCG tablet Take 1 tablet (88 mcg total) by mouth daily.  30 tablet  6  . NIFEDICAL XL 30 MG 24 hr tablet take 1 tablet by mouth once daily  30 tablet  5  . omeprazole (PRILOSEC) 20 MG capsule Take 1 capsule (20 mg total) by mouth 2 (two) times daily.  60 capsule  6  . pilocarpine (PILOCAR) 1 % ophthalmic solution       . VESICARE 5 MG tablet take 1 tablet by mouth once daily  30 tablet  5  . warfarin (COUMADIN) 5 MG tablet Take 5 mg by mouth daily.       Marland Kitchen DISCONTD: letrozole (FEMARA) 2.5 MG tablet Take 1 tablet (2.5 mg total) by mouth every evening.  30 tablet  2  . oxyCODONE-acetaminophen (PERCOCET) 5-325 MG per tablet Take as needed        OBJECTIVE: Filed Vitals:   09/03/11 1524  BP: 181/75  Pulse: 54  Temp: 98.3 F (36.8 C)     Body mass index is 32.17 kg/(m^2).    ECOG FS: 1  Physical Exam: Patient presents in a wheelchair. HEENT:  Sclerae anicteric, conjunctivae pink.  Nodes:  No cervical, supraclavicular, or axillary lymphadenopathy palpated.  Breast Exam:  Right breast is unremarkable, benign with no masses, skin changes, or nipple inversion. Patient status post left mastectomy with well-healed incision. No suspicious  nodularity, skin changes, or evidence of local recurrence. Lungs:  Clear to auscultation bilaterally.  No crackles, rhonchi, or wheezes.   Heart:  Regular rate and rhythm.   Abdomen:  Soft, nontender.  Positive bowel sounds.  No organomegaly or masses palpated.   Musculoskeletal:  No focal spinal tenderness to palpation.  Extremities:  Benign.  No peripheral edema or cyanosis.   Skin:  Benign.   Neuro:  Nonfocal.   LAB RESULTS: Lab Results  Component Value Date   WBC 6.5 08/19/2011   NEUTROABS 3.7 08/19/2011   HGB 14.2 08/19/2011   HCT 42.9 08/19/2011   MCV 80.6 08/19/2011   PLT 165 08/19/2011      Chemistry      Component Value Date/Time   NA 139 08/19/2011 1755   K 4.5 08/19/2011 1755   CL 105 08/19/2011 1755   CO2 25 08/19/2011 1755   BUN 14 08/19/2011 1755   CREATININE 1.11* 08/19/2011 1755   CREATININE 1.14* 01/20/2011 1516      Component Value Date/Time   CALCIUM 9.9 08/19/2011 1755   ALKPHOS 61 08/19/2011 1755   AST 21 08/19/2011 1755   ALT 16 08/19/2011 1755   BILITOT 0.3 08/19/2011 1755       STUDIES: Ct Head Wo Contrast  08/19/2011  *RADIOLOGY REPORT*  Clinical Data: Seizure.  Altered level of consciousness.  CT HEAD WITHOUT CONTRAST  Technique:  Contiguous axial images were obtained from the base of the skull through the vertex without contrast.  Comparison: Brain MRI on 04/30/2009  Findings: There is no evidence of intracranial hemorrhage, brain edema or other signs of acute infarction.  There is no evidence of intracranial mass lesion or mass effect.  No abnormal extra-axial fluid collections are identified.  Old bilateral parietooccipital infarcts are again seen, with ex vacuo dilatation of the occipital horns of the lateral ventricles. Ventriculomegaly is stable.  No skull abnormality identified.  IMPRESSION:  1.  No acute intracranial abnormality.  2.  Stable old bilateral parietooccipital infarcts and ventriculomegaly.  Original Report Authenticated By: Danae Orleans, M.D.      ASSESSMENT: An 78 year old Manson Pas[place] woman status post left mastectomy and sentinel lymph node sampling November 2011 for a T2 N0, grade 3 invasive ductal carcinoma which was strongly estrogen and progesterone-receptor positive, HER-2-negative, with a borderline MIB-1.  She finally started letrozole May 2012 and is tolerating it without unusual side effects.   PLAN: With regards to her breast cancer, Ms. Klugh is doing well, and there is no clinical evidence of disease recurrence. She'll continue on her letrozole which we have refilled for another year. The plan is to continue for a total of 5 years.  The meanwhile she'll continue to follow with her primary care physician. She'll see her neurologist as noted above, and will see Dr. [name] regarding the back pain and hip pain followed by his office.   Ms. Bobo is due for her next annual mammogram in October, and will return to see Dr. Darnelle Catalan for routine followup in November. They know to call the meanwhile any changes or problems.  Zion Ta    09/03/2011

## 2011-09-05 ENCOUNTER — Telehealth: Payer: Self-pay | Admitting: *Deleted

## 2011-09-05 NOTE — Telephone Encounter (Signed)
made patient appointment for 04-07-2012 at [facility] time 10:00am left voice message to inform the patient of the new date and time of the mammogram

## 2011-09-08 ENCOUNTER — Ambulatory Visit: Payer: Medicare Other | Admitting: Internal Medicine

## 2011-09-10 ENCOUNTER — Ambulatory Visit (INDEPENDENT_AMBULATORY_CARE_PROVIDER_SITE_OTHER): Payer: Medicare Other | Admitting: Internal Medicine

## 2011-09-10 ENCOUNTER — Encounter: Payer: Self-pay | Admitting: Internal Medicine

## 2011-09-10 VITALS — BP 138/80 | HR 57 | Temp 97.4°F | Ht 61.0 in | Wt 167.1 lb

## 2011-09-10 DIAGNOSIS — I10 Essential (primary) hypertension: Secondary | ICD-10-CM

## 2011-09-10 DIAGNOSIS — I82409 Acute embolism and thrombosis of unspecified deep veins of unspecified lower extremity: Secondary | ICD-10-CM

## 2011-09-10 DIAGNOSIS — M79609 Pain in unspecified limb: Secondary | ICD-10-CM

## 2011-09-10 DIAGNOSIS — I824Z9 Acute embolism and thrombosis of unspecified deep veins of unspecified distal lower extremity: Secondary | ICD-10-CM

## 2011-09-10 DIAGNOSIS — M79661 Pain in right lower leg: Secondary | ICD-10-CM

## 2011-09-10 DIAGNOSIS — C50919 Malignant neoplasm of unspecified site of unspecified female breast: Secondary | ICD-10-CM

## 2011-09-10 DIAGNOSIS — R569 Unspecified convulsions: Secondary | ICD-10-CM

## 2011-09-10 MED ORDER — FENTANYL 12 MCG/HR TD PT72: 1.0000 | MEDICATED_PATCH | TRANSDERMAL | Status: DC

## 2011-09-10 MED ORDER — GABAPENTIN 300 MG PO CAPS: 300.0000 mg | ORAL_CAPSULE | Freq: Four times a day (QID) | ORAL | Status: DC

## 2011-09-10 NOTE — Assessment & Plan Note (Addendum)
Chronic pain, multifactorial: History of DVT December 2008 right lower extremity - on chronic Coumadin - Arthritic changes with history of right total hip replacement May 2008 Status post vascular evaluation in January 2009 due to pain -not circulatory issue Ongoing orthopedic evaluation for sciatica; MRI thoracic and lumbar spine done September 2012 Intolerant of naroctics (fenantyl + Percocet) due to neuro side effects and confusion Titrate up gabapentin now - also refer back to [facility] for new opinion recommended trial scheduled Tylenol, reassurance provided

## 2011-09-10 NOTE — Assessment & Plan Note (Signed)
breakthru event with ER eval 07/2011 reviewed -  The current medical regimen is effective;  continue present plan and medications.

## 2011-09-10 NOTE — Assessment & Plan Note (Signed)
Dx 04/2010: T2N0 grade 3 invasive ductal ca, HER2 negative, higly E/P receptor positive S/p L mastectomy and LN sampling Started letrozole 10/2010 and tolerating without unusual side effects - tx 5 years planned Follows with onc for same ([facility]) - no change in tx recommended  

## 2011-09-10 NOTE — Assessment & Plan Note (Signed)
BP Readings from Last 3 Encounters:  09/10/11 138/80  09/03/11 181/75  08/19/11 148/76   The current medical regimen is effective;  continue present plan and medications.

## 2011-09-10 NOTE — Progress Notes (Signed)
Subjective:    Patient ID: Jennifer Brennan, female    DOB: July 24, 1923, 78 y.o.   MRN: [id number]  HPI  Here for follow up - reviewed chronic medical issues:  hypertension - hx labile but the patient reports compliance with medication(s) as prescribed. Denies adverse side effects.  Glaucoma, severe - follows with optho for same - dx with legal blindness resulting from same - no eye pain but reports "color" vision changes  Hypothyroid - reports decrease in dose x 30d following hosp for chest pain 01/2011 - now on "original" dose - no skin or weight changes but feels cold constantly   Right lower extremity pain - chronic symptoms, multifactorial: known sciatica, arthritic changes, and history of DVT 2008 - working with sports med provider at [facility] ortho office for same [name]) - MRI 02/2011 T&L spine> degen changes, central stenosis and R scoli -s/p ESI with [name], did well until "wore off" -now awaiting repeat injection - intolerant of Percocet due to confusion, now on fentanyl but experiencing side effects: hallucinations, constipation, sleepy  Breast cancer - s/p L breast resection and on chemo x 68yr - no evidence of recurrence on recent onc follow up   Past Medical History  Diagnosis Date  . Hypertension   . Hypothyroidism   . Seizure disorder   . History of cerebrovascular accident   . Severe stage glaucoma     legally blind  . Depression with anxiety   . OAB (overactive bladder)   . ADENOCARCINOMA, LEFT BREAST 04/11/2010    s/p L mastectomy, on femara x 64yr  . BACK PAIN, LUMBAR, CHRONIC   . DVT 05/2007    RLE following R THR - chronic coumadin, chronic RLE pain  . GLAUCOMA   . OSTEOARTHRITIS, HANDS, BILATERAL   . OSTEOPENIA   . Retinal ischemia   . Sciatica of right side     chronic RLE pain, multifactorial - MRI T/L spine 02/2011  . VITAMIN D DEFICIENCY dx 08/2008   Review of Systems Respiratory: Negative for cough and shortness of breath.   Cardiovascular: Negative  for chest pain or palpitations.  Neurological: Negative for dizziness - breakthrough seizure activity on 07/2011     Objective:   Physical Exam  BP 138/80  Pulse 57  Temp(Src) 97.4 F (36.3 C) (Oral)  Ht 5\' 1"  (1.549 m)  Wt 167 lb 1.9 oz (75.805 kg)  BMI 31.58 kg/m2  SpO2 90% Wt Readings from Last 3 Encounters:  09/10/11 167 lb 1.9 oz (75.805 kg)  09/03/11 164 lb 11.2 oz (74.707 kg)  05/07/11 154 lb (69.854 kg)   Constitutional: She is overweight but appears well-developed and well-nourished. Sitting in WC; No distress. dtr and spouse at side Cardiovascular: Normal rate, regular rhythm and normal heart sounds.  No murmur heard. No BLE edema. Pulmonary/Chest: Effort normal and breath sounds normal. No respiratory distress. She has no wheezes.  Mskel: Right lower extremity without deformity or effusion. No swelling. Full range of motion active and passive in knee ankle and toes. Skin: chronic RLE distal venous changes/darkening -no open lesions, but chronically tender to touch from knee posterior calf, ankle and foot; Skin is warm and dry. No rash noted. No erythema.  Psychiatric: She has a normal mood and affect. Her behavior is normal. Judgment and thought content normal.   Lab Results  Component Value Date   WBC 6.5 08/19/2011   HGB 14.2 08/19/2011   HCT 42.9 08/19/2011   PLT 165 08/19/2011   CHOL 201*  01/23/2011   TRIG 112 01/23/2011   HDL 64 01/23/2011   ALT 16 08/19/2011   AST 21 08/19/2011   NA 139 08/19/2011   K 4.5 08/19/2011   CL 105 08/19/2011   CREATININE 1.11* 08/19/2011   BUN 14 08/19/2011   CO2 25 08/19/2011   TSH 0.11* 05/07/2011   INR 2.8 08/27/2011   HGBA1C 5.7 08/06/2007      Assessment & Plan:  See problem list. Medications and labs reviewed today.

## 2011-09-10 NOTE — Patient Instructions (Signed)
It was good to see you today. Increase gabapentin to 4pills per day and continue Duragesic patch until bext back pain shot done - Your prescription(s) have been submitted to your pharmacy. Please take as directed and contact our office if you believe you are having problem(s) with the medication(s). Other Medications reviewed, no changes at this time. we'll make referral to vein specialist for your leg. Our office will contact you regarding appointment(s) once made. Please schedule followup in 6 months, call sooner if problems.

## 2011-09-11 ENCOUNTER — Ambulatory Visit (INDEPENDENT_AMBULATORY_CARE_PROVIDER_SITE_OTHER): Payer: Medicare Other | Admitting: *Deleted

## 2011-09-11 DIAGNOSIS — I82409 Acute embolism and thrombosis of unspecified deep veins of unspecified lower extremity: Secondary | ICD-10-CM

## 2011-09-11 DIAGNOSIS — I639 Cerebral infarction, unspecified: Secondary | ICD-10-CM

## 2011-09-11 DIAGNOSIS — I6350 Cerebral infarction due to unspecified occlusion or stenosis of unspecified cerebral artery: Secondary | ICD-10-CM

## 2011-09-12 ENCOUNTER — Other Ambulatory Visit: Payer: Self-pay | Admitting: Cardiology

## 2011-09-15 ENCOUNTER — Other Ambulatory Visit: Payer: Self-pay

## 2011-09-19 ENCOUNTER — Other Ambulatory Visit: Payer: Self-pay | Admitting: Internal Medicine

## 2011-09-29 ENCOUNTER — Ambulatory Visit (INDEPENDENT_AMBULATORY_CARE_PROVIDER_SITE_OTHER): Payer: Medicare Other | Admitting: Internal Medicine

## 2011-09-29 ENCOUNTER — Encounter: Payer: Self-pay | Admitting: Internal Medicine

## 2011-09-29 VITALS — BP 100/58 | HR 77 | Temp 97.0°F | Resp 16

## 2011-09-29 DIAGNOSIS — Z7901 Long term (current) use of anticoagulants: Secondary | ICD-10-CM

## 2011-09-29 DIAGNOSIS — J209 Acute bronchitis, unspecified: Secondary | ICD-10-CM

## 2011-09-29 DIAGNOSIS — J9801 Acute bronchospasm: Secondary | ICD-10-CM

## 2011-09-29 MED ORDER — HYDROCODONE-HOMATROPINE 5-1.5 MG/5ML PO SYRP: 5.0000 mL | ORAL_SOLUTION | Freq: Four times a day (QID) | ORAL | Status: AC | PRN

## 2011-09-29 MED ORDER — DOXYCYCLINE HYCLATE 100 MG PO TABS: 100.0000 mg | ORAL_TABLET | Freq: Two times a day (BID) | ORAL | Status: AC

## 2011-09-29 MED ORDER — PREDNISONE (PAK) 10 MG PO TABS: 10.0000 mg | ORAL_TABLET | ORAL | Status: AC

## 2011-09-29 NOTE — Patient Instructions (Signed)
It was good to see you today. Doxycycline antibiotics twice a day for one week, prednisone taper over next 6 days and use hydrocodone syrup as needed for cough - Your prescription(s) have been submitted to your pharmacy. Please take as directed and contact our office if you believe you are having problem(s) with the medication(s). All antibiotics can interact with warfarin - if any change in bruising, bleeding or other problems occur while taking doxycycline, stop the antibiotic and call here or  Coumadin clinic for further instruction Hydrate, rest and call if symptoms worse or unimproved

## 2011-09-29 NOTE — Progress Notes (Signed)
Subjective:    Patient ID: Jennifer Brennan, female    DOB: [birth year], 78 y.o.   MRN: [id number]  HPI Here for cough Onset >1 week ago associated with wheeze and chest rattle - little productive sputum Low-grade fever and fatigue Dictated by sick contacts at home and allergy season  Also  reviewed chronic medical issues:  hypertension - hx labile but the patient reports compliance with medication(s) as prescribed. Denies adverse side effects.  Glaucoma, severe - follows with optho for same - dx with legal blindness resulting from same - no eye pain but reports "color" vision changes  Hypothyroid - reports decrease in dose x 30d following hosp for chest pain 01/2011 - now on "original" dose - no skin or weight changes but feels cold constantly   Right lower extremity pain - chronic symptoms, multifactorial: known sciatica, arthritic changes, and history of DVT 2008 - working with sports med provider at [facility] ortho office for same Jennifer Brennan) - MRI 02/2011 T&L spine> degen changes, central stenosis and R scoli -s/p ESI with Jennifer Brennan, did well until "wore off" -now awaiting repeat injection - intolerant of Percocet due to confusion, now on fentanyl but experiencing side effects: hallucinations, constipation, sleepy  Breast cancer - s/p L breast resection and on chemo x 86yr - no evidence of recurrence on recent onc follow up   Past Medical History  Diagnosis Date  . Hypertension   . Hypothyroidism   . Seizure disorder   . History of cerebrovascular accident   . Severe stage glaucoma     legally blind  . Depression with anxiety   . OAB (overactive bladder)   . ADENOCARCINOMA, LEFT BREAST 04/11/2010    s/p L mastectomy, on femara x 93yr  . BACK PAIN, LUMBAR, CHRONIC   . DVT 05/2007    RLE following R THR - chronic coumadin, chronic RLE pain  . GLAUCOMA   . OSTEOARTHRITIS, HANDS, BILATERAL   . OSTEOPENIA   . Retinal ischemia   . Sciatica of right side     chronic RLE pain,  multifactorial - MRI T/L spine 02/2011  . VITAMIN D DEFICIENCY dx 08/2008   Review of Systems Respiratory: Negative for shortness of breath.   Cardiovascular: Negative for chest pain or palpitations.  Neurological: Negative for dizziness - breakthrough seizure activity on 07/2011     Objective:   Physical Exam  BP 100/58  Pulse 77  Temp(Src) 97 F (36.1 C) (Oral)  Resp 16  SpO2 92% Wt Readings from Last 3 Encounters:  09/10/11 167 lb 1.9 oz (75.805 kg)  09/03/11 164 lb 11.2 oz (74.707 kg)  05/07/11 154 lb (69.854 kg)   Constitutional: She is overweight but appears well-developed and well-nourished. Sitting in WC; No distress. dtr and spouse at side Cardiovascular: Normal rate, regular rhythm and normal heart sounds.  No murmur heard. No BLE edema. Pulmonary/Chest: Bilateral rhonchi with mild end expiratory wheeze left greater than right base. No increased work of breathing at rest  Mskel: Right lower extremity without deformity or effusion. No swelling. Full range of motion active and passive in knee ankle and toes. Skin: chronic RLE distal venous changes/darkening -no open lesions, but chronically tender to touch from knee posterior calf, ankle and foot; Skin is warm and dry. No rash noted. No erythema.  Psychiatric: She has a normal mood and affect. Her behavior is normal. Judgment and thought content normal.   Lab Results  Component Value Date   WBC 6.5 08/19/2011   HGB  14.2 08/19/2011   HCT 42.9 08/19/2011   PLT 165 08/19/2011   CHOL 201* 01/23/2011   TRIG 112 01/23/2011   HDL 64 01/23/2011   ALT 16 08/19/2011   AST 21 08/19/2011   NA 139 08/19/2011   K 4.5 08/19/2011   CL 105 08/19/2011   CREATININE 1.11* 08/19/2011   BUN 14 08/19/2011   CO2 25 08/19/2011   TSH 0.11* 05/07/2011   INR 2.8 09/11/2011   HGBA1C 5.7 08/06/2007      Assessment & Plan:   Acute bronchitis with bronchospasm - treat with doxycycline, prednisone taper and cough suppressant - Reviewed possible interaction  between anticoagulation and antibiotic, risks versus benefits explained  See problem list. Medications and labs reviewed today.

## 2011-10-02 ENCOUNTER — Ambulatory Visit (INDEPENDENT_AMBULATORY_CARE_PROVIDER_SITE_OTHER): Payer: Medicare Other | Admitting: Pharmacist

## 2011-10-02 DIAGNOSIS — I639 Cerebral infarction, unspecified: Secondary | ICD-10-CM

## 2011-10-02 DIAGNOSIS — I6350 Cerebral infarction due to unspecified occlusion or stenosis of unspecified cerebral artery: Secondary | ICD-10-CM

## 2011-10-02 DIAGNOSIS — I82409 Acute embolism and thrombosis of unspecified deep veins of unspecified lower extremity: Secondary | ICD-10-CM

## 2011-10-02 LAB — POCT INR: INR: 3.4

## 2011-10-07 ENCOUNTER — Other Ambulatory Visit: Payer: Self-pay | Admitting: Medical Oncology

## 2011-10-07 DIAGNOSIS — C44599 Other specified malignant neoplasm of skin of other part of trunk: Secondary | ICD-10-CM

## 2011-10-07 DIAGNOSIS — C50919 Malignant neoplasm of unspecified site of unspecified female breast: Secondary | ICD-10-CM

## 2011-10-07 MED ORDER — LETROZOLE 2.5 MG PO TABS: 2.5000 mg | ORAL_TABLET | Freq: Every evening | ORAL | Status: DC

## 2011-10-08 ENCOUNTER — Telehealth: Payer: Self-pay | Admitting: *Deleted

## 2011-10-08 MED ORDER — LEVOTHYROXINE SODIUM 88 MCG PO TABS: 88.0000 ug | ORAL_TABLET | Freq: Every day | ORAL | Status: DC

## 2011-10-08 NOTE — Telephone Encounter (Signed)
Received PA for synthroid 88 mcg. Contacted insurance they stated snythroid is not covered, but levothyroxine 88 would be. Sent another rx for levothyroxine to pharmacy... 10/08/11@3 :43pm/LMB

## 2011-10-13 ENCOUNTER — Other Ambulatory Visit: Payer: Self-pay | Admitting: Internal Medicine

## 2011-10-14 ENCOUNTER — Ambulatory Visit: Payer: Medicare Other | Admitting: Neurology

## 2011-10-16 ENCOUNTER — Encounter: Payer: Self-pay | Admitting: Neurology

## 2011-10-16 ENCOUNTER — Ambulatory Visit (INDEPENDENT_AMBULATORY_CARE_PROVIDER_SITE_OTHER): Payer: Medicare Other | Admitting: Neurology

## 2011-10-16 VITALS — BP 140/84 | HR 66 | Wt 167.0 lb

## 2011-10-16 DIAGNOSIS — I6350 Cerebral infarction due to unspecified occlusion or stenosis of unspecified cerebral artery: Secondary | ICD-10-CM

## 2011-10-16 DIAGNOSIS — I639 Cerebral infarction, unspecified: Secondary | ICD-10-CM

## 2011-10-16 MED ORDER — LEVETIRACETAM ER 500 MG PO TB24: ORAL_TABLET | ORAL | Status: DC

## 2011-10-16 NOTE — Progress Notes (Signed)
Dear Dr. Felicity Coyer,  Thank you for having me see Jennifer Brennan in consultation today at [facility] for her problem with epilepsy.  As you may recall, she is a 78 y.o. 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  She has had seizures since her hemorrhage in the mid 80s.  While some of the history of her epilepsy care is hazy, she was originally placed on Depakote.  She was switched to gabapentin for seizure treatment about 10 years ago by Dr. Kendell Bane at [facility].  Her seizures are described as behavioral arrest with unresponsiveness and "falling backwards".  She typically returns to normal 5-10 minutes after they occur.  Her last seizures was about 5 years ago.  With her latest event in February she said just before the event that she felt hot.  She then was noted to become unresponsive and again her "eyes roll back". She was not clearly ill before the event, but had been on antibiotics -- unfortunately the type of antibiotics are not known.    It sounds like she is intermittently compliant with gabapentin.  She only takes it twice a day at 600mg .  Her daughter says she may miss it.  She also complains of pain in the back as well as pain that shoots down her right leg.  This has been going at least 2 years.  An MRI of her L-spine revealed moderate central canal stenosis at L3-L4 and L4-L5 as well as left lateral recess narrowing at L2-L3.  She does not think the gabapentin she takes for her seizures.  She was using a fentanyl patch and this was helping with her pain but for an unknown reason stopped this.  Past Medical History  Diagnosis Date  . Hypertension   . Hypothyroidism   . Seizure disorder   . History of cerebrovascular accident   . Severe stage glaucoma     legally  blind  . Depression with anxiety   . OAB (overactive bladder)   . ADENOCARCINOMA, LEFT BREAST 04/11/2010    s/p L mastectomy, on femara x 51yr  . BACK PAIN, LUMBAR, CHRONIC   . DVT 05/2007    RLE following R THR - chronic coumadin, chronic RLE pain  . GLAUCOMA   . OSTEOARTHRITIS, HANDS, BILATERAL   . OSTEOPENIA   . Retinal ischemia   . Sciatica of right side     chronic RLE pain, multifactorial - MRI T/L spine 02/2011  . VITAMIN D DEFICIENCY dx 08/2008    Past Surgical History  Procedure Date  . Total abdominal hysterectomy   . Total hip arthroplasty 10/2006    right hip  . Refractive surgery     B/L  . Breast surgery 03/2010    breast biopsy, L mastectomy   . Tonsillectomy     History   Social History  . Marital Status: Married    Spouse Name: N/A    Number of Children: N/A  . Years of Education: N/A   Social History Main Topics  . Smoking status: Former Smoker    Quit date: 10/16/1979  . Smokeless tobacco: Never Used  . Alcohol Use: No  . Drug Use: No  . Sexually Active: None   Other Topics Concern  . None   Social History Narrative   Retired Married  Never Smoked     Alcohol use-no               Family History  Problem Relation Age of Onset  . Ovarian cancer Mother   . Arthritis Other     grandparents  . Lung cancer Other   . Heart disease Other     parent    Current Outpatient Prescriptions on File Prior to Visit  Medication Sig Dispense Refill  . acetaminophen (TYLENOL) 500 MG tablet Take 1 tablet (500 mg total) by mouth every 6 (six) hours as needed for pain.  100 tablet  2  . brimonidine (ALPHAGAN) 0.15 % ophthalmic solution Place 1 drop into the left eye every 12 (twelve) hours.       . Cholecalciferol 1000 UNITS tablet Take 1,000 Units by mouth daily.        Marland Kitchen DIOVAN 80 MG tablet take 1 tablet by mouth once daily  30 tablet  5  . docusate sodium (COLACE) 100 MG capsule Take 100 mg by mouth daily.        . dorzolamide-timolol (COSOPT)  22.3-6.8 MG/ML ophthalmic solution Place 1 drop into both eyes 2 (two) times daily.        Marland Kitchen FLUOCINOLONE ACETONIDE SCALP 0.01 % external oil       . gabapentin (NEURONTIN) 300 MG capsule Take 1 capsule (300 mg total) by mouth 4 (four) times daily.  120 capsule  3  . latanoprost (XALATAN) 0.005 % ophthalmic solution Place 1 drop into both eyes at bedtime.        Marland Kitchen letrozole (FEMARA) 2.5 MG tablet Take 1 tablet (2.5 mg total) by mouth every evening.  30 tablet  6  . levothyroxine (SYNTHROID, LEVOTHROID) 88 MCG tablet Take 1 tablet (88 mcg total) by mouth daily.  30 tablet  6  . NIFEDICAL XL 30 MG 24 hr tablet take 1 tablet by mouth once daily  30 tablet  5  . omeprazole (PRILOSEC) 20 MG capsule Take 1 capsule (20 mg total) by mouth 2 (two) times daily.  60 capsule  6  . oxyCODONE-acetaminophen (PERCOCET) 5-325 MG per tablet Take as needed      . pilocarpine (PILOCAR) 1 % ophthalmic solution       . VESICARE 5 MG tablet take 1 tablet by mouth once daily  30 tablet  5  . warfarin (COUMADIN) 5 MG tablet take as directed BY COUMADIN CLINIC  40 tablet  3  . fentaNYL (DURAGESIC) 12 MCG/HR Place 1 patch (12.5 mcg total) onto the skin every 3 (three) days.  10 patch  0  . levETIRAcetam (KEPPRA XR) 500 MG 24 hr tablet take 1 before bed for 2 weeks, then increase to 2 before bed from then on.  60 tablet  3    Allergies  Allergen Reactions  . Morphine Sulfate     REACTION: rash: ITCHING  . Penicillins     REACTION: urticaria (hives)  . Sertraline Hcl     REACTION: rash      ROS:  13 systems were reviewed and are notable for tenderness to palpation of her right lower leg.  She is legally blind.  All other review of systems are unremarkable.   Examination:  Filed Vitals:   10/16/11 1337  BP: 140/84  Pulse: 66  Weight: 167 lb (75.751 kg)     In general, thin appearing womn.  Cardiovascular: The patient has a regular rate and rhythm and no carotid bruits.  Fundoscopy:  Very constricted  pupils.  Cannot see fundi.  Mental status:   The patient is oriented to person, place and time. Recent and remote memory are intact. Attention span and concentration are normal. Language including repetition, naming, following commands are intact. Fund of knowledge of current and historical events, as well as vocabulary are normal.  Cranial Nerves: Pupils poorly reactive OD 2, OS 1.5. Cannot perceive light. Extraocular movements are intact without nystagmus. Facial sensation and muscles of mastication are intact. Muscles of facial expression are symmetric. Hearing decreased to finger rub. Tongue protrusion, uvula, palate midline.  Shoulder shrug intact  Motor:  The patient has decreased bulk in lower extremities.  Obvious venous stasis changes in right leg.  Some weakness in right lower leg but likely related to feared pain about palpation of the lower extremity.    Reflexes:   Biceps  Triceps Brachioradialis Knee Ankle  Right 2+  2+  2+   2+ 2+  Left  2+  2+  2+   2+ 2+  Toes down  Coordination:  Not tested  Sensation is decreased to temperature in lower extremities in length dep manner.  Vibration moderately impaired.  Position is intact.  Gait was not tested.  Extremities - significant venous stasis changes in right lower extremity with tenderness on the right shin   Impression/Recs: 1.  Epilepsy - It is difficult to tell why she had a breakthrough seizures.  I am concerned about non-compliance.  I am going to start her on Keppra XR 500->1000mg  daily so she only has to remember to take it once per day.  I will consider weaning her gabapentin at her next visit.  However, if this makes her pain worse then I would leave both medications in place. 2.  Right lower extremity pain - Likely from radiculopathy - although may be peripheral nerve or musculoskeletal as well.  However, for now I have just recommended she go back on the fentanyl patch.  She can also consider repeat ESI which  apparently was done before although it sounds like this on made her back pain better and not her leg pain.   We will see the patient back in 3 months.  Thank you for having [place] see Jennifer Brennan in consultation.  Feel free to contact me with any questions.  Lupita Raider Modesto Charon, MD [facility], [place] [address] [place], [place] [address] Phone: [phone] Fax: [phone].

## 2011-10-17 ENCOUNTER — Ambulatory Visit (INDEPENDENT_AMBULATORY_CARE_PROVIDER_SITE_OTHER): Payer: Medicare Other | Admitting: Pharmacist

## 2011-10-17 DIAGNOSIS — I6350 Cerebral infarction due to unspecified occlusion or stenosis of unspecified cerebral artery: Secondary | ICD-10-CM

## 2011-10-17 DIAGNOSIS — I82409 Acute embolism and thrombosis of unspecified deep veins of unspecified lower extremity: Secondary | ICD-10-CM

## 2011-10-17 DIAGNOSIS — I639 Cerebral infarction, unspecified: Secondary | ICD-10-CM

## 2011-10-27 ENCOUNTER — Encounter: Payer: Medicare Other | Admitting: Surgery

## 2011-10-30 ENCOUNTER — Ambulatory Visit (INDEPENDENT_AMBULATORY_CARE_PROVIDER_SITE_OTHER): Payer: Medicare Other | Admitting: Internal Medicine

## 2011-10-30 ENCOUNTER — Encounter: Payer: Self-pay | Admitting: Internal Medicine

## 2011-10-30 VITALS — BP 138/78 | HR 58 | Temp 97.9°F

## 2011-10-30 DIAGNOSIS — I872 Venous insufficiency (chronic) (peripheral): Secondary | ICD-10-CM

## 2011-10-30 DIAGNOSIS — M79609 Pain in unspecified limb: Secondary | ICD-10-CM

## 2011-10-30 DIAGNOSIS — M79661 Pain in right lower leg: Secondary | ICD-10-CM

## 2011-10-30 NOTE — Patient Instructions (Signed)
It was good to see you today. Occasional "clear weeping" is normal - keep wrapped and covered as discussed - but call if any increased redness, heat or fever - or if the "tears" are cloudy in color rather than clear

## 2011-10-30 NOTE — Assessment & Plan Note (Signed)
Chronic pain, multifactorial: History of DVT December 2008 right lower extremity - on chronic Coumadin - Arthritic changes with history of right total hip replacement May 2008 Status post vascular evaluation in January 2009 due to pain -not circulatory issue Ongoing orthopedic evaluation for sciatica; MRI thoracic and lumbar spine done September 2012 Intolerant of naroctics (fenantyl + Percocet) due to neuro side effects and confusion Titratating gabapentin and scheduled Tylenol Re: periodic clear weeping, reassurance provided - no evidence for infection Supportive care as ongoing

## 2011-10-30 NOTE — Progress Notes (Signed)
  Subjective:    Patient ID: Jennifer Brennan, female    DOB: Dec 03, 1923, 78 y.o.   MRN: [id number]  HPI  complains of "weeping" from RLE Onset 3 days ago, improved with elevation and guaze wrapping Reports occasional recurrence of same every few months Not swollen, hot or red No fever - no other swelling or shortness of breath  No increase in chronic pain  Past Medical History  Diagnosis Date  . Hypertension   . Hypothyroidism   . Seizure disorder   . History of cerebrovascular accident   . Severe stage glaucoma     legally blind  . Depression with anxiety   . OAB (overactive bladder)   . ADENOCARCINOMA, LEFT BREAST 04/11/2010    s/p L mastectomy, on femara x 28yr  . BACK PAIN, LUMBAR, CHRONIC   . DVT 05/2007    RLE following R THR - chronic coumadin, chronic RLE pain  . GLAUCOMA   . OSTEOARTHRITIS, HANDS, BILATERAL   . OSTEOPENIA   . Retinal ischemia   . Sciatica of right side     chronic RLE pain, multifactorial - MRI T/L spine 02/2011  . VITAMIN D DEFICIENCY dx 08/2008    Review of Systems  Constitutional: Negative for fever.  Respiratory: Negative for cough and shortness of breath.   Cardiovascular: Negative for chest pain and palpitations.       Objective:   Physical Exam BP 138/78  Pulse 58  Temp(Src) 97.9 F (36.6 C) (Oral)  SpO2 91% Constitutional: She is overweight but appears well-developed and well-nourished. Sitting in WC; No distress. dtr and spouse at side Cardiovascular: Normal rate, regular rhythm and normal heart sounds.  No murmur heard. No BLE edema. Chronic venou changes distal LE with scant clear weeping Pulmonary/Chest: Effort normal and breath sounds normal. No respiratory distress. She has no wheezes.  Mskel: Right lower extremity without deformity or effusion. No swelling. Full range of motion active and passive in knee ankle and toes. Skin: chronic RLE distal venous changes/darkening -no open lesions, but chronically tender to touch from  knee posterior calf, ankle and foot; clear weeping at 3 locations. Remaining skin is warm and dry. No rash noted. No erythema.       Assessment & Plan:

## 2011-11-04 ENCOUNTER — Other Ambulatory Visit: Payer: Self-pay | Admitting: Internal Medicine

## 2011-11-05 ENCOUNTER — Telehealth: Payer: Self-pay | Admitting: Pharmacist

## 2011-11-05 NOTE — Telephone Encounter (Signed)
Faxed orders to Dr. Alvester Morin that it is okay to hold Coumadin with no Lovenox bridge prior to injection.

## 2011-11-05 NOTE — Telephone Encounter (Signed)
Message copied by Velda Shell on Wed Nov 05, 2011  3:46 PM ------      Message from: [name] Paci A      Created: Wed Nov 05, 2011  1:17 PM       that is my understanding,  so i think ok to not bridge.      But i will support your preference -      Thanks Kasen Adduci!      VAL      ----- Message -----         From: Mariane Masters, PHARMD         Sent: 11/05/2011  12:58 PM           To: Newt Lukes, MD            I got your fax on Mrs. Bourbeau and holding Coumadin for injection.  In the past we have bridged her secondary to CVA but I was looking at little closer at her records.  It looks like she had a bleed, not clot that caused her CVA.  Is this correct?  If so, given her age are you okay with her not bridging with Lovenox?  Thanks, Kennon Rounds

## 2011-11-10 ENCOUNTER — Ambulatory Visit (INDEPENDENT_AMBULATORY_CARE_PROVIDER_SITE_OTHER): Payer: Medicare Other | Admitting: Pharmacist

## 2011-11-10 DIAGNOSIS — I82409 Acute embolism and thrombosis of unspecified deep veins of unspecified lower extremity: Secondary | ICD-10-CM

## 2011-11-10 DIAGNOSIS — I6350 Cerebral infarction due to unspecified occlusion or stenosis of unspecified cerebral artery: Secondary | ICD-10-CM

## 2011-11-10 DIAGNOSIS — I639 Cerebral infarction, unspecified: Secondary | ICD-10-CM

## 2011-11-18 ENCOUNTER — Ambulatory Visit (INDEPENDENT_AMBULATORY_CARE_PROVIDER_SITE_OTHER): Payer: Medicare Other | Admitting: *Deleted

## 2011-11-18 DIAGNOSIS — I6350 Cerebral infarction due to unspecified occlusion or stenosis of unspecified cerebral artery: Secondary | ICD-10-CM

## 2011-11-18 DIAGNOSIS — I639 Cerebral infarction, unspecified: Secondary | ICD-10-CM

## 2011-11-18 DIAGNOSIS — I82409 Acute embolism and thrombosis of unspecified deep veins of unspecified lower extremity: Secondary | ICD-10-CM

## 2011-11-18 LAB — POCT INR: INR: 2.2

## 2011-11-19 ENCOUNTER — Encounter: Payer: Medicare Other | Admitting: Vascular Surgery

## 2011-11-19 ENCOUNTER — Other Ambulatory Visit: Payer: Self-pay

## 2011-11-19 MED ORDER — FENTANYL 12 MCG/HR TD PT72: 1.0000 | MEDICATED_PATCH | TRANSDERMAL | Status: DC

## 2011-11-19 NOTE — Telephone Encounter (Signed)
Pt's daughter informed, Rx in cabinet for pt pick up  

## 2011-11-28 ENCOUNTER — Telehealth: Payer: Self-pay | Admitting: Pharmacist

## 2011-11-28 NOTE — Telephone Encounter (Signed)
Faxed approval to [facility] for patient to hold coumadin x5 days prior to epidural steroid injection.  Spoke with patient's daughter and informed her of consent and requested that she notify us once procedure date is scheduled.

## 2011-12-03 ENCOUNTER — Ambulatory Visit (INDEPENDENT_AMBULATORY_CARE_PROVIDER_SITE_OTHER): Payer: Medicare Other | Admitting: *Deleted

## 2011-12-03 DIAGNOSIS — I639 Cerebral infarction, unspecified: Secondary | ICD-10-CM

## 2011-12-03 DIAGNOSIS — I6350 Cerebral infarction due to unspecified occlusion or stenosis of unspecified cerebral artery: Secondary | ICD-10-CM

## 2011-12-03 DIAGNOSIS — I82409 Acute embolism and thrombosis of unspecified deep veins of unspecified lower extremity: Secondary | ICD-10-CM

## 2011-12-03 NOTE — Patient Instructions (Signed)
Anticoagulation safety reviewed 

## 2011-12-05 ENCOUNTER — Encounter: Payer: Medicare Other | Admitting: Vascular Surgery

## 2011-12-10 ENCOUNTER — Ambulatory Visit (INDEPENDENT_AMBULATORY_CARE_PROVIDER_SITE_OTHER): Payer: Medicare Other | Admitting: *Deleted

## 2011-12-10 DIAGNOSIS — I6350 Cerebral infarction due to unspecified occlusion or stenosis of unspecified cerebral artery: Secondary | ICD-10-CM

## 2011-12-10 DIAGNOSIS — I639 Cerebral infarction, unspecified: Secondary | ICD-10-CM

## 2011-12-10 DIAGNOSIS — I82409 Acute embolism and thrombosis of unspecified deep veins of unspecified lower extremity: Secondary | ICD-10-CM

## 2011-12-23 ENCOUNTER — Ambulatory Visit (INDEPENDENT_AMBULATORY_CARE_PROVIDER_SITE_OTHER): Payer: Medicare Other | Admitting: *Deleted

## 2011-12-23 DIAGNOSIS — I639 Cerebral infarction, unspecified: Secondary | ICD-10-CM

## 2011-12-23 DIAGNOSIS — I6350 Cerebral infarction due to unspecified occlusion or stenosis of unspecified cerebral artery: Secondary | ICD-10-CM

## 2011-12-23 DIAGNOSIS — I82409 Acute embolism and thrombosis of unspecified deep veins of unspecified lower extremity: Secondary | ICD-10-CM

## 2012-01-07 ENCOUNTER — Encounter: Payer: Self-pay | Admitting: Internal Medicine

## 2012-01-07 ENCOUNTER — Ambulatory Visit (INDEPENDENT_AMBULATORY_CARE_PROVIDER_SITE_OTHER): Payer: Medicare Other | Admitting: Internal Medicine

## 2012-01-07 ENCOUNTER — Telehealth: Payer: Self-pay | Admitting: Internal Medicine

## 2012-01-07 ENCOUNTER — Ambulatory Visit (INDEPENDENT_AMBULATORY_CARE_PROVIDER_SITE_OTHER): Payer: Medicare Other | Admitting: *Deleted

## 2012-01-07 VITALS — BP 132/62 | HR 62 | Temp 98.4°F

## 2012-01-07 DIAGNOSIS — M79609 Pain in unspecified limb: Secondary | ICD-10-CM

## 2012-01-07 DIAGNOSIS — M79661 Pain in right lower leg: Secondary | ICD-10-CM

## 2012-01-07 DIAGNOSIS — I639 Cerebral infarction, unspecified: Secondary | ICD-10-CM

## 2012-01-07 DIAGNOSIS — I82409 Acute embolism and thrombosis of unspecified deep veins of unspecified lower extremity: Secondary | ICD-10-CM

## 2012-01-07 DIAGNOSIS — M545 Low back pain: Secondary | ICD-10-CM

## 2012-01-07 DIAGNOSIS — L03115 Cellulitis of right lower limb: Secondary | ICD-10-CM

## 2012-01-07 DIAGNOSIS — I6350 Cerebral infarction due to unspecified occlusion or stenosis of unspecified cerebral artery: Secondary | ICD-10-CM

## 2012-01-07 DIAGNOSIS — L02419 Cutaneous abscess of limb, unspecified: Secondary | ICD-10-CM

## 2012-01-07 MED ORDER — GABAPENTIN 300 MG PO CAPS: 600.0000 mg | ORAL_CAPSULE | Freq: Four times a day (QID) | ORAL | Status: DC

## 2012-01-07 MED ORDER — SULFAMETHOXAZOLE-TRIMETHOPRIM 800-160 MG PO TABS: 1.0000 | ORAL_TABLET | Freq: Two times a day (BID) | ORAL | Status: DC

## 2012-01-07 NOTE — Assessment & Plan Note (Signed)
Chronic pain, multifactorial: History of DVT December 2008 right lower extremity - on chronic Coumadin - Arthritic changes with history of right total hip replacement May 2008 Status post vascular evaluation in January 2009 due to pain -not circulatory issue Periodic orthopedic evaluation for sciatica; MRI thoracic and lumbar spine done September 2012 Drs Francis Dowse and Alvester Morin - s/p ESI in 2013, some relief back pain but not RLE pain generally intolerant of narcotics, but on low dose fenantyl + Percocet - cause neuro side effects and confusion Titratating gabapentin - increase dose now and continue scheduled Tylenol Family requests refer to pain mgmt - will do so now Re: periodic clear weeping, reassurance provided - Supportive care as ongoing

## 2012-01-07 NOTE — Assessment & Plan Note (Signed)
Follows with ortho - [facility] for same Complicated by chronic RLE pain - see above At request of family, will refer to pain mgmt also increase gabapentin given poor tolerance of higher dose narcotics

## 2012-01-07 NOTE — Patient Instructions (Addendum)
It was good to see you today. Septra antibiotics 2x/day x 5 days for leg infection. Occasional "clear weeping" is normal - keep wrapped and covered as discussed - but call if any increased redness, heat or fever - or if the "tears" are cloudy in color rather than clear Increase gabapentin to 600mg  4x/day (8 pills per 24hours) Your prescription(s) have been submitted to your pharmacy. Please take as directed and contact our office if you believe you are having problem(s) with the medication(s). we'll make referral to pain specialist clinic. Our office will contact you regarding appointment(s) once made. Please keep scheduled followup in september, call sooner if problems.

## 2012-01-07 NOTE — Telephone Encounter (Signed)
Caller: Joyce/Child; PCP: [name] Paci; CB#: [phone]; Call regarding blisters on right leg that have not improved; Increased pain, "severe".  Dried yellow drainage on leg.  Appointment ast 1600 with Dr. Bayard Hugger per Leg Non-Injury protocol.

## 2012-01-07 NOTE — Progress Notes (Signed)
  Subjective:    Patient ID: Jennifer Brennan, female    DOB: 19-Jan-1924, 78 y.o.   MRN: [id number]  Leg Pain    complains of "weeping" from RLE - yellow/cloudy fluid Onset 2 days ago, improved with elevation and guaze wrapping Reports occasional recurrence of same every few months mildly swollen, but not hot or red No fever - no other swelling or shortness of breath  No increase in chronic pain - see CC above - family asks about pain specialist evaluation  Past Medical History  Diagnosis Date  . Hypertension   . Hypothyroidism   . Seizure disorder   . History of cerebrovascular accident   . Severe stage glaucoma     legally blind  . Depression with anxiety   . OAB (overactive bladder)   . ADENOCARCINOMA, LEFT BREAST 04/11/2010    s/p L mastectomy, on femara x 34yr  . BACK PAIN, LUMBAR, CHRONIC   . DVT 05/2007    RLE following R THR - chronic coumadin, chronic RLE pain  . GLAUCOMA   . OSTEOARTHRITIS, HANDS, BILATERAL   . OSTEOPENIA   . Retinal ischemia   . Sciatica of right side     chronic RLE pain, multifactorial - MRI T/L spine 02/2011  . VITAMIN D DEFICIENCY dx 08/2008    Review of Systems  Constitutional: Negative for fever.  Respiratory: Negative for cough and shortness of breath.   Cardiovascular: Negative for chest pain and palpitations.       Objective:   Physical Exam  BP 132/62  Pulse 62  Temp 98.4 F (36.9 C) (Oral)  SpO2 94% Constitutional: She is overweight but appears well-developed and well-nourished. Sitting in WC; No distress. dtr and spouse at side Cardiovascular: Normal rate, regular rhythm and normal heart sounds.  No murmur heard. No BLE edema. Chronic venous changes distal RLE - see below Pulmonary/Chest: Effort normal and breath sounds normal. No respiratory distress. She has no wheezes.  Mskel: Right lower extremity without deformity or effusion. No swelling. Full range of motion active and passive in knee ankle and toes. Skin: chronic RLE  distal venous changes/darkening -no open lesions, but chronically tender to touch from knee posterior calf, ankle and foot; yellow weeping at 3 locations. Remaining skin is warm and dry. No rash noted.    Lab Results  Component Value Date   WBC 6.5 08/19/2011   HGB 14.2 08/19/2011   HCT 42.9 08/19/2011   PLT 165 08/19/2011   GLUCOSE 103* 08/19/2011   CHOL 201* 01/23/2011   TRIG 112 01/23/2011   HDL 64 01/23/2011   LDLCALC 244* 01/23/2011   ALT 16 08/19/2011   AST 21 08/19/2011   NA 139 08/19/2011   K 4.5 08/19/2011   CL 105 08/19/2011   CREATININE 1.11* 08/19/2011   BUN 14 08/19/2011   CO2 25 08/19/2011   TSH 0.11* 05/07/2011   INR 3.7 01/07/2012   HGBA1C 5.7 08/06/2007       Assessment & Plan:  RLE cellulitis, early infection - Septra bid x 5 days - advised on interaction with coumadin but feel potential risk outweighed by potential benefit of infection resolution  Also see problem list. Medications and labs reviewed today.

## 2012-01-08 NOTE — Telephone Encounter (Signed)
Seen 7/17 

## 2012-01-12 ENCOUNTER — Ambulatory Visit (INDEPENDENT_AMBULATORY_CARE_PROVIDER_SITE_OTHER): Payer: Medicare Other | Admitting: *Deleted

## 2012-01-12 ENCOUNTER — Other Ambulatory Visit: Payer: Self-pay

## 2012-01-12 DIAGNOSIS — I6350 Cerebral infarction due to unspecified occlusion or stenosis of unspecified cerebral artery: Secondary | ICD-10-CM

## 2012-01-12 DIAGNOSIS — I82409 Acute embolism and thrombosis of unspecified deep veins of unspecified lower extremity: Secondary | ICD-10-CM

## 2012-01-12 DIAGNOSIS — I639 Cerebral infarction, unspecified: Secondary | ICD-10-CM

## 2012-01-12 MED ORDER — FENTANYL 12 MCG/HR TD PT72: 1.0000 | MEDICATED_PATCH | TRANSDERMAL | Status: DC

## 2012-01-12 NOTE — Telephone Encounter (Signed)
Rx in cabinet for pt pick up, no answer, no VM.

## 2012-01-13 ENCOUNTER — Telehealth: Payer: Self-pay | Admitting: Internal Medicine

## 2012-01-13 NOTE — Telephone Encounter (Signed)
Caller: Joyce/Child; PCP: [name] Paci; CB#: [phone]; ; ; Call regarding Wheezing and cough; states she is on a pain patch which she changes q 3 days; family has been using her hydrocodone cough medication to reduce the cough.  States recently finished her antibiotic for infectiion in her leg.  Denies difficulty breathing, but states she does not look even as good as she did 24 hours ago.  She has been wheezing.  Patient is compromised by the cough, per family, as she is not able to tolerate activity, and is wheezing even at rest.  Per protocol, advised ED; will take to [facility] ED.

## 2012-01-14 ENCOUNTER — Ambulatory Visit (INDEPENDENT_AMBULATORY_CARE_PROVIDER_SITE_OTHER): Payer: Medicare Other | Admitting: Internal Medicine

## 2012-01-14 ENCOUNTER — Telehealth: Payer: Self-pay | Admitting: Internal Medicine

## 2012-01-14 ENCOUNTER — Encounter: Payer: Self-pay | Admitting: Internal Medicine

## 2012-01-14 ENCOUNTER — Encounter: Payer: Self-pay | Admitting: Neurology

## 2012-01-14 ENCOUNTER — Ambulatory Visit (INDEPENDENT_AMBULATORY_CARE_PROVIDER_SITE_OTHER): Payer: Medicare Other | Admitting: Neurology

## 2012-01-14 VITALS — BP 130/62 | HR 61 | Temp 98.1°F | Ht 61.0 in

## 2012-01-14 DIAGNOSIS — R569 Unspecified convulsions: Secondary | ICD-10-CM

## 2012-01-14 DIAGNOSIS — J209 Acute bronchitis, unspecified: Secondary | ICD-10-CM

## 2012-01-14 DIAGNOSIS — I878 Other specified disorders of veins: Secondary | ICD-10-CM

## 2012-01-14 DIAGNOSIS — L03119 Cellulitis of unspecified part of limb: Secondary | ICD-10-CM

## 2012-01-14 DIAGNOSIS — L02419 Cutaneous abscess of limb, unspecified: Secondary | ICD-10-CM

## 2012-01-14 DIAGNOSIS — I872 Venous insufficiency (chronic) (peripheral): Secondary | ICD-10-CM

## 2012-01-14 MED ORDER — SULFAMETHOXAZOLE-TRIMETHOPRIM 800-160 MG PO TABS: 1.0000 | ORAL_TABLET | Freq: Two times a day (BID) | ORAL | Status: AC

## 2012-01-14 MED ORDER — ALBUTEROL SULFATE HFA 108 (90 BASE) MCG/ACT IN AERS: 2.0000 | INHALATION_SPRAY | Freq: Four times a day (QID) | RESPIRATORY_TRACT | Status: DC | PRN

## 2012-01-14 MED ORDER — PREDNISONE (PAK) 10 MG PO TABS: 10.0000 mg | ORAL_TABLET | ORAL | Status: AC

## 2012-01-14 NOTE — Patient Instructions (Addendum)
It was good to see you today. Septra antibiotics 2x/day x 7 days for bronchitis and leg infection. Prednisone taper x 6 days and use Albuterol inhaler as needed Your prescription(s) have been submitted to your pharmacy. Please take as directed and contact our office if you believe you are having problem(s) with the medication(s). Occasional "clear weeping" is normal - keep wrapped and covered as discussed - but call if any increased redness, heat or fever - or if the "tears" are cloudy in color rather than clear Please keep scheduled followup in september, call sooner if problems.

## 2012-01-14 NOTE — Progress Notes (Signed)
Subjective:    Patient ID: Jennifer Brennan, female    DOB: 1924-02-25, 78 y.o.   MRN: [id number]  Wheezing  This is a recurrent problem. The current episode started 1 to 4 weeks ago. The problem occurs intermittently. The problem has been waxing and waning. Associated symptoms include coughing and sputum production. Pertinent negatives include no chest pain, fever, hemoptysis, rash, shortness of breath, sore throat or swollen glands. The symptoms are aggravated by any activity. She has tried prescription cough suppressant for the symptoms. The treatment provided moderate relief. There is no history of asthma or COPD.   complains of conitnued "weeping" from RLE - yellow/cloudy fluid Onset >1 week days ago, not improved with elevation and guaze wrapping or Septra x 5days Reports occasional recurrence of same every few months mildly swollen, but not hot or red No fever - no other swelling or shortness of breath  associated with increase in chronic RLE pain - family asks about pain specialist evaluation  Also requests dermatology eval and ?follow up with ortho -?[facility]  Past Medical History  Diagnosis Date  . Hypertension   . Hypothyroidism   . Seizure disorder   . History of cerebrovascular accident   . Severe stage glaucoma     legally blind  . Depression with anxiety   . OAB (overactive bladder)   . ADENOCARCINOMA, LEFT BREAST 04/11/2010    s/p L mastectomy, on femara x 43yr  . BACK PAIN, LUMBAR, CHRONIC   . DVT 05/2007    RLE following R THR - chronic coumadin, chronic RLE pain  . GLAUCOMA   . OSTEOARTHRITIS, HANDS, BILATERAL   . OSTEOPENIA   . Retinal ischemia   . Sciatica of right side     chronic RLE pain, multifactorial - MRI T/L spine 02/2011  . VITAMIN D DEFICIENCY dx 08/2008    Review of Systems  Constitutional: Negative for fever.  HENT: Negative for sore throat.   Respiratory: Positive for cough, sputum production and wheezing. Negative for hemoptysis and shortness of  breath.   Cardiovascular: Negative for chest pain and palpitations.  Skin: Negative for rash.       Objective:   Physical Exam  BP 130/62  Pulse 61  Temp 98.1 F (36.7 C) (Oral)  Ht 5\' 1"  (1.549 m)  SpO2 91% Constitutional: She is overweight but appears well-developed and well-nourished. Sitting in WC; No distress. dtr and spouse at side Cardiovascular: Normal rate, regular rhythm and normal heart sounds.  No murmur heard. No BLE edema. Chronic venous changes distal RLE - see below Pulmonary/Chest: Effort normal, breath sounds diminished at bases. No respiratory distress. She has soft end exp wheezes.  Mskel: Right lower extremity without deformity or effusion. No swelling. Full range of motion active and passive in knee ankle and toes. Skin: chronic RLE distal venous changes/darkening -no open lesions, but chronically tender to touch from knee posterior calf, ankle and foot; yellow weeping at 3 locations. Remaining skin is warm and dry. No rash noted.    Lab Results  Component Value Date   WBC 6.5 08/19/2011   HGB 14.2 08/19/2011   HCT 42.9 08/19/2011   PLT 165 08/19/2011   GLUCOSE 103* 08/19/2011   CHOL 201* 01/23/2011   TRIG 112 01/23/2011   HDL 64 01/23/2011   LDLCALC 409* 01/23/2011   ALT 16 08/19/2011   AST 21 08/19/2011   NA 139 08/19/2011   K 4.5 08/19/2011   CL 105 08/19/2011   CREATININE 1.11* 08/19/2011  BUN 14 08/19/2011   CO2 25 08/19/2011   TSH 0.11* 05/07/2011   INR 3.2 01/12/2012   HGBA1C 5.7 08/06/2007       Assessment & Plan:  Bronchospasm - ?early bronchitis - resume antibiotics and tx pred taper x 6 days, use Albuterol MDI prn  RLE cellulitis, early infection? - continue Septra- advised on interaction with coumadin but feel potential risk outweighed by potential benefit of infection resolution

## 2012-01-14 NOTE — Telephone Encounter (Signed)
Caller: Joyce/Child; PCP: [name] Paci; CB#: [phone]; ; ; Call regarding Wheezing; Follow up to triage call from 01/13/12. Patient would not go to the ED yesterday. Conferenced patient in on the phone call with daughter to ask questions. Patient complains of no shortness of breath this morning. She is breathing and having no wheezing at this time. Patient reports that she has had a cold recently. Emergent s/s of "Intermittent wheezing, shortness of breath, or cough and has had viral illness within last month" positive per Breathing Problems guideline. Appointment scheduled with Dr. Felicity Coyer 01/14/12 at 2:30pm. Patient is unable to get to the office before this time. Her daughter has to come pick her up.

## 2012-01-14 NOTE — Telephone Encounter (Signed)
Noted thanks °

## 2012-01-15 ENCOUNTER — Ambulatory Visit: Payer: Medicare Other | Admitting: Neurology

## 2012-01-16 ENCOUNTER — Encounter
Payer: Medicare Other | Attending: Physical Medicine and Rehabilitation | Admitting: Physical Medicine and Rehabilitation

## 2012-01-16 ENCOUNTER — Encounter: Payer: Self-pay | Admitting: Physical Medicine and Rehabilitation

## 2012-01-16 VITALS — BP 168/82 | HR 70 | Resp 14 | Ht 60.0 in | Wt 166.0 lb

## 2012-01-16 DIAGNOSIS — H539 Unspecified visual disturbance: Secondary | ICD-10-CM | POA: Insufficient documentation

## 2012-01-16 DIAGNOSIS — E669 Obesity, unspecified: Secondary | ICD-10-CM | POA: Insufficient documentation

## 2012-01-16 DIAGNOSIS — G8929 Other chronic pain: Secondary | ICD-10-CM | POA: Insufficient documentation

## 2012-01-16 DIAGNOSIS — E039 Hypothyroidism, unspecified: Secondary | ICD-10-CM | POA: Insufficient documentation

## 2012-01-16 DIAGNOSIS — Z8673 Personal history of transient ischemic attack (TIA), and cerebral infarction without residual deficits: Secondary | ICD-10-CM | POA: Insufficient documentation

## 2012-01-16 DIAGNOSIS — Z853 Personal history of malignant neoplasm of breast: Secondary | ICD-10-CM | POA: Insufficient documentation

## 2012-01-16 DIAGNOSIS — I10 Essential (primary) hypertension: Secondary | ICD-10-CM | POA: Insufficient documentation

## 2012-01-16 DIAGNOSIS — M79605 Pain in left leg: Secondary | ICD-10-CM

## 2012-01-16 DIAGNOSIS — M79609 Pain in unspecified limb: Secondary | ICD-10-CM | POA: Insufficient documentation

## 2012-01-16 DIAGNOSIS — Z86718 Personal history of other venous thrombosis and embolism: Secondary | ICD-10-CM | POA: Insufficient documentation

## 2012-01-16 DIAGNOSIS — R269 Unspecified abnormalities of gait and mobility: Secondary | ICD-10-CM

## 2012-01-16 NOTE — Progress Notes (Signed)
Dear Dr. Felicity Coyer,   Thank you for having me see Jennifer Brennan in [place] today at [facility] for her problem with epilepsy. As you may recall, she is a 78 y.o. 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 She has had seizures since her hemorrhage in the mid 80s. While some of the history of her epilepsy care is hazy, she was originally placed on Depakote. She was switched to gabapentin for seizure treatment about 10 years ago by Dr. Kendell Bane at [facility]. Her seizures are described as behavioral arrest with unresponsiveness and "falling backwards". She typically returns to normal 5-10 minutes after they occur. Her last seizures was about 5 years ago.   With her latest event in February she said just before the event that she felt hot. She then was noted to become unresponsive and again her "eyes roll back". She was not clearly ill before the event, but had been on antibiotics -- unfortunately the type of antibiotics are not known.   It sounds like she is intermittently compliant with gabapentin. She only takes it twice a day at 600mg . Her daughter says she may miss it.   She also complains of pain in the back as well as pain that shoots down her right leg. This has been going at least 2 years. An MRI of her L-spine revealed moderate central canal stenosis at L3-L4 and L4-L5 as well as left lateral recess narrowing at L2-L3. She does not think the gabapentin helps that she takes for her seizures. She has restarted the fentanyl patch that was helping for her pain.  ---------------------  At her last clinic visit I started her on Keppra XR 1000mg  daily.  My goal was to use this once a day medicine because of her difficulties with compliance.  They do not report any problems with her  Keppra, and don't report any seizures.  however, after checking with her drug store it turns out she only have it filled in April and never had it filled again, so evidently she is not taking it.  Her husband, the patient and her daughter at least gave the impression that they don't know what else she is taking.  Evidently for her leg pain, you have attempted to increase her gabapentin to 900 bid.   She also has had an ESI from pain management for her back and leg pain.  She continues to have pain in her back shooting into her right leg.  She also has pain from what appears to be venous stasis ulcer/?infection in her right lower extremity.  Medical history, social history, and family history were reviewed and have not changed since the last clinic visit.  Current Outpatient Prescriptions on File Prior to Visit  Medication Sig Dispense Refill  . acetaminophen (TYLENOL) 500 MG tablet Take 1 tablet (500 mg total) by mouth every 6 (six) hours as needed for pain.  100 tablet  2  . albuterol (PROVENTIL HFA;VENTOLIN HFA) 108 (90 BASE) MCG/ACT inhaler Inhale 2 puffs into the lungs every 6 (six) hours as needed for wheezing.  1 Inhaler  0  . brimonidine (ALPHAGAN) 0.15 % ophthalmic solution Place 1 drop into the left eye every 12 (twelve) hours.       . Cholecalciferol 1000 UNITS tablet Take 1,000 Units by mouth daily.        Marland Kitchen  DIOVAN 80 MG tablet take 1 tablet by mouth once daily  30 tablet  5  . docusate sodium (COLACE) 100 MG capsule Take 100 mg by mouth daily.        . dorzolamide-timolol (COSOPT) 22.3-6.8 MG/ML ophthalmic solution Place 1 drop into both eyes 2 (two) times daily.        . fentaNYL (DURAGESIC - DOSED MCG/HR) 12 MCG/HR Place 1 patch (12.5 mcg total) onto the skin every 3 (three) days.  10 patch  0  . FLUOCINOLONE ACETONIDE SCALP 0.01 % external oil       . gabapentin (NEURONTIN) 300 MG capsule Take 900 mg by mouth 2 (two) times daily.      Marland Kitchen latanoprost (XALATAN) 0.005 % ophthalmic  solution Place 1 drop into both eyes at bedtime.        Marland Kitchen letrozole (FEMARA) 2.5 MG tablet Take 1 tablet (2.5 mg total) by mouth every evening.  30 tablet  6  . levothyroxine (SYNTHROID, LEVOTHROID) 88 MCG tablet Take 1 tablet (88 mcg total) by mouth daily.  30 tablet  6  . NIFEDICAL XL 30 MG 24 hr tablet take 1 tablet by mouth once daily  30 tablet  5  . omeprazole (PRILOSEC) 20 MG capsule take 1 capsule by mouth twice a day  60 capsule  6  . oxyCODONE-acetaminophen (PERCOCET) 5-325 MG per tablet Take as needed      . pilocarpine (PILOCAR) 1 % ophthalmic solution       . VESICARE 5 MG tablet take 1 tablet by mouth once daily  30 tablet  5  . warfarin (COUMADIN) 5 MG tablet take as directed BY COUMADIN CLINIC  40 tablet  3  . levETIRAcetam (KEPPRA XR) 500 MG 24 hr tablet take 1 before bed for 2 weeks, then increase to 2 before bed from then on.  60 tablet  3    Allergies  Allergen Reactions  . Morphine Sulfate     REACTION: rash: ITCHING  . Penicillins     REACTION: urticaria (hives)  . Sertraline Hcl     REACTION: rash    ROS:  13 systems were reviewed and are notable for back pain and right leg pain. ?cellulitis in right leg.  All other review of systems are unremarkable.  Exam: . In general, women in wheel chair in NAD.  Ext:  indurated discolored lower leg, with ?ulcer  Impression/Recommendations:  1.  Epilepsy - We have called the patient's daughter to let her know that the patient was not taking the Keppra XR 1000mg .  I am unsure whether the patient will comply.  If she has another seizure I would make sure this is being taken by checking a Keppra level.  The Keppra can be increased to as much as 3000mg  per day if necessary. 2.  Pain - I have convinced them to increase the gabapentin to 900mg  bid.  This will hopefully prevent seizures as well, although ideally should be taken three times a day for that purpose.   Lupita Raider Modesto Charon, MD [facility], [place]

## 2012-01-16 NOTE — Patient Instructions (Addendum)
Followup with vascular specialist  I will see you back in one month  Please keep your pain medications locked up and in a secure location.  I may consider having a therapist see you who can help you make walking safer for you.

## 2012-01-16 NOTE — Progress Notes (Signed)
Subjective:    Patient ID: Jennifer Brennan, female    DOB: [birth year], 78 y.o.   MRN: [id number]  HPI  78 year old woman who is accompanied by her daughter and her granddaughter and her husband today in clinic. They are in the room today with her permission. She has visual deficits.  She reports pain for 3-4 years in the right lower extremity. Pain is constant a 7 to a 10 on a scale of 10 worse with standing and walking.  She reports some relief with pain medication  Pain Inventory Average Pain 10 Pain Right Now 7 My pain is constant, sharp, stabbing and aching  In the last 24 hours, has pain interfered with the following? General activity 10 Relation with others 10 Enjoyment of life 10 What TIME of day is your pain at its worst? all the time Sleep (in general) Fair  Pain is worse with: walking, bending, sitting, inactivity, standing and some activites Pain improves with: rest, heat/ice and medication Relief from Meds: 7  Mobility use a cane use a walker how many minutes can you walk? 5 ability to climb steps?  no do you drive?  no use a wheelchair Do you have any goals in this area?  yes  Function retired Do you have any goals in this area?  yes  Neuro/Psych weakness numbness trouble walking confusion depression  Prior Studies Any changes since last visit?  no  Physicians involved in your care Primary care [facility]   Family History  Problem Relation Age of Onset  . Ovarian cancer Mother   . Arthritis Other     grandparents  . Lung cancer Other   . Heart disease Other     parent   History   Social History  . Marital Status: Married    Spouse Name: N/A    Number of Children: N/A  . Years of Education: N/A   Social History Main Topics  . Smoking status: Former Smoker    Quit date: 10/16/1979  . Smokeless tobacco: Never Used  . Alcohol Use: No  . Drug Use: No  . Sexually Active: None   Other Topics Concern  . None   Social History  Narrative   Retired Married     Never Smoked     Alcohol use-no              Past Surgical History  Procedure Date  . Total abdominal hysterectomy   . Total hip arthroplasty 10/2006    right hip  . Refractive surgery     B/L  . Breast surgery 03/2010    breast biopsy, L mastectomy   . Tonsillectomy    Past Medical History  Diagnosis Date  . Hypertension   . Hypothyroidism   . Seizure disorder   . History of cerebrovascular accident   . Severe stage glaucoma     legally blind  . Depression with anxiety   . OAB (overactive bladder)   . ADENOCARCINOMA, LEFT BREAST 04/11/2010    s/p L mastectomy, on femara x 65yr  . BACK PAIN, LUMBAR, CHRONIC   . DVT 05/2007    RLE following R THR - chronic coumadin, chronic RLE pain  . GLAUCOMA   . OSTEOARTHRITIS, HANDS, BILATERAL   . OSTEOPENIA   . Retinal ischemia   . Sciatica of right side     chronic RLE pain, multifactorial - MRI T/L spine 02/2011  . VITAMIN D DEFICIENCY dx 08/2008   BP 168/82  Pulse 70  Resp 14  Ht 5' (1.524 m)  Wt 166 lb (75.297 kg)  BMI 32.42 kg/m2  SpO2 95%     Review of Systems  Musculoskeletal: Positive for myalgias, joint swelling and arthralgias.  All other systems reviewed and are negative.       Objective:   Physical Exam She is an obese elderly woman who does not appear in any distress. Skin is remarkable for integration in the right lower extremity. She has dark discoloration throughout the tibial region. She has some weepy from various sites.  Her cranial nerves are remarkable for decreased visual acuity. Coordination is grossly intact  Manual muscle testing reveals 4+ over 5 strength in both upper and lower extremities without obvious focal deficits  Reflexes are diminished overall.  Sensation is intact to light touch in both lower extremities.  She transitions from sitting to standing slowly. Gait is stable and very slow.  Tandem gait was not evaluated due to safety concerns  Left  dorsalis pedis is intact and easily palpated  Right dorsalis pedis is not present.       Assessment & Plan:  Chronic right leg pain with decreased pulse and right lower extremity skin changes are noted as well would like to have vascular consult.  Other causes of chronic leg pain could be neuropathic or orthopedic in nature.   Consider PT to work on balance and improve ambulation safety.   Pain medication in the elderly substantially increased risk of fall special in individual with visual impairment. Patient and family members understand this.   Check UDS prior to prescription.  F/U in 2 weeks.

## 2012-01-19 NOTE — Addendum Note (Signed)
Addended by: Judd Gaudier on: 01/19/2012 10:53 AM   Modules accepted: Orders

## 2012-01-20 ENCOUNTER — Ambulatory Visit (INDEPENDENT_AMBULATORY_CARE_PROVIDER_SITE_OTHER): Payer: Medicare Other | Admitting: Pharmacist

## 2012-01-20 DIAGNOSIS — I82409 Acute embolism and thrombosis of unspecified deep veins of unspecified lower extremity: Secondary | ICD-10-CM

## 2012-01-20 DIAGNOSIS — I6350 Cerebral infarction due to unspecified occlusion or stenosis of unspecified cerebral artery: Secondary | ICD-10-CM

## 2012-01-20 DIAGNOSIS — I639 Cerebral infarction, unspecified: Secondary | ICD-10-CM

## 2012-01-21 ENCOUNTER — Other Ambulatory Visit: Payer: Self-pay

## 2012-01-21 DIAGNOSIS — R0989 Other specified symptoms and signs involving the circulatory and respiratory systems: Secondary | ICD-10-CM

## 2012-01-22 ENCOUNTER — Telehealth: Payer: Self-pay

## 2012-01-22 NOTE — Telephone Encounter (Signed)
Per Dr. Modesto Charon she should check with her primary care doctor regarding the sleepiness and facial swelling.  Unlikely that it is a bleed.  Multiple attempts to reach pt's daughter (busy signal), will keep trying.

## 2012-01-22 NOTE — Telephone Encounter (Signed)
Pt's daughter stopped by because she was concerned because her mom has been sleeping a lot since last night, she can't seem to stay awake.  She said her face swelled today and she gave her a benadryl today at 2:00pm.  Her recent INR was 4.3 and they cut back to coumadin 5 mg to one pill a day and 1/2 on Friday.  She is just concerned about what she should be looking for as far as bleed.   She is not having any stroke symptoms.

## 2012-01-22 NOTE — Telephone Encounter (Signed)
Pt's daughter notified of Dr. Nash Dimmer info and recommendations.

## 2012-01-26 ENCOUNTER — Ambulatory Visit (INDEPENDENT_AMBULATORY_CARE_PROVIDER_SITE_OTHER): Payer: Medicare Other | Admitting: Internal Medicine

## 2012-01-26 ENCOUNTER — Telehealth: Payer: Self-pay | Admitting: General Practice

## 2012-01-26 ENCOUNTER — Encounter: Payer: Self-pay | Admitting: Internal Medicine

## 2012-01-26 VITALS — BP 122/72 | HR 60 | Temp 97.5°F | Ht 61.0 in

## 2012-01-26 DIAGNOSIS — L03119 Cellulitis of unspecified part of limb: Secondary | ICD-10-CM

## 2012-01-26 DIAGNOSIS — Z7901 Long term (current) use of anticoagulants: Secondary | ICD-10-CM

## 2012-01-26 DIAGNOSIS — M79609 Pain in unspecified limb: Secondary | ICD-10-CM

## 2012-01-26 DIAGNOSIS — L03115 Cellulitis of right lower limb: Secondary | ICD-10-CM

## 2012-01-26 DIAGNOSIS — I10 Essential (primary) hypertension: Secondary | ICD-10-CM

## 2012-01-26 DIAGNOSIS — M79661 Pain in right lower leg: Secondary | ICD-10-CM

## 2012-01-26 MED ORDER — DOXYCYCLINE HYCLATE 100 MG PO TABS: 100.0000 mg | ORAL_TABLET | Freq: Two times a day (BID) | ORAL | Status: DC

## 2012-01-26 NOTE — Assessment & Plan Note (Signed)
BP Readings from Last 3 Encounters:  01/26/12 122/72  01/16/12 168/82  01/14/12 130/62   The current medical regimen is effective;  continue present plan and medications. Reviewed "normal" BP parameters with family today and importance of compliance with meds as rx'd

## 2012-01-26 NOTE — Patient Instructions (Signed)
It was good to see you today. doxycycline antibiotics 2x/day x 10 days for leg infection. Occasional "clear weeping" is normal - keep washed, wrapped and covered with neosproin as discussed - but call if any increased redness, heat or fever - or if the "tears" are cloudy in color rather than clear Your prescription(s) have been submitted to your pharmacy. Please take as directed and contact our office if you believe you are having problem(s) with the medication(s). Other Medications reviewed, no changes at this time. Please keep scheduled followup in september, call sooner if problems.

## 2012-01-26 NOTE — Progress Notes (Signed)
Subjective:    Patient ID: Jennifer Brennan, female    DOB: [birth year], 78 y.o.   MRN: [id number]  HPI complains of increased "weeping" from RLE - yellow/cloudy fluid>now white/thick Onset >2 weeks ago, not improved with elevation and guaze wrapping Improved while on Septra, but recurrent drainage when antibiotics stopped Reports occasional recurrence of same every few months mildly swollen, but not hot or red No fever - no other swelling or shortness of breath   Infection symptoms associated with increase in chronic RLE pain - pending pain specialist evaluation, derm and ortho for ?ESI  Past Medical History  Diagnosis Date  . Hypertension   . Hypothyroidism   . Seizure disorder   . History of cerebrovascular accident   . Severe stage glaucoma     legally blind  . Depression with anxiety   . OAB (overactive bladder)   . ADENOCARCINOMA, LEFT BREAST 04/11/2010    s/p L mastectomy, on femara x 63yr  . BACK PAIN, LUMBAR, CHRONIC   . DVT 05/2007    RLE following R THR - chronic coumadin, chronic RLE pain  . GLAUCOMA   . OSTEOARTHRITIS, HANDS, BILATERAL   . OSTEOPENIA   . Retinal ischemia   . Sciatica of right side     chronic RLE pain, multifactorial - MRI T/L spine 02/2011  . VITAMIN D DEFICIENCY dx 08/2008    Review of Systems  Constitutional: Positive for chills and fatigue. Negative for fever and unexpected weight change.  Respiratory: Negative for cough and shortness of breath.   Cardiovascular: Positive for leg swelling. Negative for chest pain and palpitations.  Skin: Positive for color change and wound (RLE).       Objective:   Physical Exam  BP 122/72  Pulse 60  Temp 97.5 F (36.4 C) (Oral)  Ht 5\' 1"  (1.549 m)  SpO2 97% Constitutional: She is overweight but appears well-developed and well-nourished. Sitting in WC; No distress. dtr and spouse at side Cardiovascular: Normal rate, regular rhythm and normal heart sounds.  No murmur heard. No BLE edema. Chronic  venous changes distal RLE - see below Pulmonary/Chest: Effort normal, breath sounds diminished at bases. No respiratory distress. She has no exp wheezes.  Mskel: Right lower extremity without deformity or effusion. No swelling of joints. Full range of motion active and passive in knee, ankle and toes. Skin: chronic RLE distal venous changes/darkening -no open lesions, but chronically tender to touch from knee posterior calf, ankle and foot; yellow weeping at 3 locations. Remaining skin is warm and dry. No rash noted.    Lab Results  Component Value Date   WBC 6.5 08/19/2011   HGB 14.2 08/19/2011   HCT 42.9 08/19/2011   PLT 165 08/19/2011   GLUCOSE 103* 08/19/2011   CHOL 201* 01/23/2011   TRIG 112 01/23/2011   HDL 64 01/23/2011   LDLCALC 130* 01/23/2011   ALT 16 08/19/2011   AST 21 08/19/2011   NA 139 08/19/2011   K 4.5 08/19/2011   CL 105 08/19/2011   CREATININE 1.11* 08/19/2011   BUN 14 08/19/2011   CO2 25 08/19/2011   TSH 0.11* 05/07/2011   INR 4.5 01/20/2012   HGBA1C 5.7 08/06/2007      Assessment & Plan:   RLE cellulitis, recurrent infection - improved on Septra, but recurrent weeping/purulence off abx Will instead use doxy x 10 days continue wound care and neosporin prn advised on interaction with coumadin but feel potential risk outweighed by potential benefit of infection resolution  Chronic anticoag - recent supratheraputic level related to antibiotics reviewed No symptoms bleeding or bruising - risk benefit antibiotics vs infection again reviewed as above

## 2012-01-26 NOTE — Assessment & Plan Note (Signed)
Chronic pain, multifactorial: History of DVT December 2008 right lower extremity - on chronic Coumadin - Arthritic changes with history of right total hip replacement May 2008 Status post vascular evaluation in January 2009 due to pain -not circulatory issue, but reeval by [facility] pending at family request Periodic orthopedic evaluation for sciatica; MRI thoracic and lumbar spine done September 2012 Drs Francis Dowse and Alvester Morin - s/p ESI in 2013, some relief back pain but not RLE pain -  generally intolerant of narcotics, but on low dose fenantyl + Percocet - reviewed subsquent neuro side effects, sedation and confusion Titratating gabapentin and continue scheduled Tylenol Pending eval by pain mgmt at family request Re: periodic clear weeping, reassurance provided - Supportive care as ongoing

## 2012-01-27 ENCOUNTER — Ambulatory Visit (INDEPENDENT_AMBULATORY_CARE_PROVIDER_SITE_OTHER): Payer: Medicare Other

## 2012-01-27 DIAGNOSIS — I6350 Cerebral infarction due to unspecified occlusion or stenosis of unspecified cerebral artery: Secondary | ICD-10-CM

## 2012-01-27 DIAGNOSIS — I82409 Acute embolism and thrombosis of unspecified deep veins of unspecified lower extremity: Secondary | ICD-10-CM

## 2012-01-27 DIAGNOSIS — I639 Cerebral infarction, unspecified: Secondary | ICD-10-CM

## 2012-01-27 LAB — POCT INR: INR: 1.9

## 2012-01-27 NOTE — Telephone Encounter (Signed)
Spoke with [name] (patient's dtr) Patient starting doxycycline today.  Instructed not to change coumadin dosage.  Patient has appointment in coumadin clinic on Tues morning.

## 2012-01-28 ENCOUNTER — Emergency Department (HOSPITAL_COMMUNITY)
Admission: EM | Admit: 2012-01-28 | Discharge: 2012-01-28 | Disposition: A | Discharge status: Home / Self Care | Payer: Medicare Other | Attending: Emergency Medicine | Admitting: Emergency Medicine

## 2012-01-28 ENCOUNTER — Emergency Department (HOSPITAL_COMMUNITY): Payer: Medicare Other

## 2012-01-28 ENCOUNTER — Telehealth: Payer: Self-pay

## 2012-01-28 ENCOUNTER — Encounter (HOSPITAL_COMMUNITY): Payer: Self-pay | Admitting: Emergency Medicine

## 2012-01-28 DIAGNOSIS — L97909 Non-pressure chronic ulcer of unspecified part of unspecified lower leg with unspecified severity: Secondary | ICD-10-CM

## 2012-01-28 DIAGNOSIS — Z87891 Personal history of nicotine dependence: Secondary | ICD-10-CM | POA: Insufficient documentation

## 2012-01-28 DIAGNOSIS — I83009 Varicose veins of unspecified lower extremity with ulcer of unspecified site: Secondary | ICD-10-CM | POA: Insufficient documentation

## 2012-01-28 DIAGNOSIS — E039 Hypothyroidism, unspecified: Secondary | ICD-10-CM | POA: Insufficient documentation

## 2012-01-28 DIAGNOSIS — Z86718 Personal history of other venous thrombosis and embolism: Secondary | ICD-10-CM | POA: Insufficient documentation

## 2012-01-28 DIAGNOSIS — M79609 Pain in unspecified limb: Secondary | ICD-10-CM | POA: Insufficient documentation

## 2012-01-28 DIAGNOSIS — I10 Essential (primary) hypertension: Secondary | ICD-10-CM | POA: Insufficient documentation

## 2012-01-28 DIAGNOSIS — Z79899 Other long term (current) drug therapy: Secondary | ICD-10-CM | POA: Insufficient documentation

## 2012-01-28 DIAGNOSIS — M7989 Other specified soft tissue disorders: Secondary | ICD-10-CM | POA: Insufficient documentation

## 2012-01-28 LAB — BASIC METABOLIC PANEL
CO2: 24 mEq/L (ref 19–32)
Calcium: 9.2 mg/dL (ref 8.4–10.5)
Chloride: 106 mEq/L (ref 96–112)
Creatinine, Ser: 1.22 mg/dL — ABNORMAL HIGH (ref 0.50–1.10)
Glucose, Bld: 96 mg/dL (ref 70–99)
Sodium: 140 mEq/L (ref 135–145)

## 2012-01-28 LAB — CBC WITH DIFFERENTIAL/PLATELET
Basophils Absolute: 0 10*3/uL (ref 0.0–0.1)
Eosinophils Relative: 3 % (ref 0–5)
HCT: 40.5 % (ref 36.0–46.0)
Lymphocytes Relative: 43 % (ref 12–46)
Lymphs Abs: 3.7 10*3/uL (ref 0.7–4.0)
MCV: 81.7 fL (ref 78.0–100.0)
Monocytes Absolute: 0.8 10*3/uL (ref 0.1–1.0)
Neutro Abs: 3.9 10*3/uL (ref 1.7–7.7)
RBC: 4.96 MIL/uL (ref 3.87–5.11)
RDW: 15.1 % (ref 11.5–15.5)
WBC: 8.6 10*3/uL (ref 4.0–10.5)

## 2012-01-28 LAB — PROTIME-INR
INR: 2.03 — ABNORMAL HIGH (ref 0.00–1.49)
Prothrombin Time: 23.3 seconds — ABNORMAL HIGH (ref 11.6–15.2)

## 2012-01-28 MED ORDER — OXYCODONE-ACETAMINOPHEN 5-325 MG PO TABS
1.0000 | ORAL_TABLET | Freq: Once | ORAL | Status: AC
  Administered 2012-01-28: 1 via ORAL
  Filled 2012-01-28: qty 1

## 2012-01-28 NOTE — ED Notes (Signed)
Pain onset weeks ago intermittent however extremity pain one day ago.  Currently pain at rest 0/10 with movement or palpation pain increases 5-10/10 achy sharp.

## 2012-01-28 NOTE — ED Notes (Signed)
[facility] ARRIVED TO TRANSPORT PT. HOME. FAMILY PRESENT DURING TRANSPORT TO GIVE DIRECTIONS.

## 2012-01-28 NOTE — ED Provider Notes (Signed)
I saw and evaluated the patient, reviewed the resident's note and I agree with the findings and plan.   .Face to face Exam:  General:  Awake HEENT:  Atraumatic Resp:  Normal effort Abd:  Nondistended Neuro:No focal weakness Lymph: No adenopathy   Nelia Shi, MD 01/28/12 [phone]

## 2012-01-28 NOTE — ED Notes (Signed)
Placed call for transport back home

## 2012-01-28 NOTE — ED Notes (Signed)
Patient transported from stretcher to wheelchair with assistance.  Family states have to go up 6 stairs at home. Doctor notified called [facility] for transport.

## 2012-01-28 NOTE — ED Notes (Signed)
Per EMS: pt c/o right leg pain x several weeks that became more severe last night with discoloration and some edema noted

## 2012-01-28 NOTE — ED Provider Notes (Signed)
History     CSN: [id number]  Arrival date & time 01/28/12  1116   First MD Initiated Contact with Patient 01/28/12 1500      Chief Complaint  Patient presents with  . Leg Pain    (Consider location/radiation/quality/duration/timing/severity/associated sxs/prior treatment) HPI Comments: This is a 78 year old female with history of DVT, chronic anticoagulation, and right lower extremity venous stasis/ulceration who presents with worsening leg pain and swelling in the lower right leg.  She has had venous stasis changes to her skin for years in the right leg, and for months has complained of clear weeping from the skin.  Although not noted in the physical exam, there was concern for infection by the PCP and she was treated as an outpatient with PO Septra.  The family reports some improvement with Septra, but as soon as this course finished, the weeping, swelling and pain progressed again.  She was again seen by here PCP two days prior to presentation and her skin had ulcerated.  She was started on a 10 day course of doxycycline.  The swelling and pain worsened since then so she came to the ED.  She denies dizziness, headaches, changes in vision and hearing, dyspnea, chest pain, abdominal pain, diarrhea, and dysuria.   Past Medical History  Diagnosis Date  . Hypertension   . Hypothyroidism   . Seizure disorder   . History of cerebrovascular accident   . Severe stage glaucoma     legally blind  . Depression with anxiety   . OAB (overactive bladder)   . ADENOCARCINOMA, LEFT BREAST 04/11/2010    s/p L mastectomy, on femara x 38yr  . BACK PAIN, LUMBAR, CHRONIC   . DVT 05/2007    RLE following R THR - chronic coumadin, chronic RLE pain  . GLAUCOMA   . OSTEOARTHRITIS, HANDS, BILATERAL   . OSTEOPENIA   . Retinal ischemia   . Sciatica of right side     chronic RLE pain, multifactorial - MRI T/L spine 02/2011  . VITAMIN D DEFICIENCY dx 08/2008    Past Surgical History  Procedure Date  .  Total abdominal hysterectomy   . Total hip arthroplasty 10/2006    right hip  . Refractive surgery     B/L  . Breast surgery 03/2010    breast biopsy, L mastectomy   . Tonsillectomy     Family History  Problem Relation Age of Onset  . Ovarian cancer Mother   . Arthritis Other     grandparents  . Lung cancer Other   . Heart disease Other     parent    History  Substance Use Topics  . Smoking status: Former Smoker    Quit date: 10/16/1979  . Smokeless tobacco: Never Used  . Alcohol Use: No    OB History    Grav Para Term Preterm Abortions TAB SAB Ect Mult Living                  Review of Systems  All other systems reviewed and are negative.    Allergies  Morphine sulfate; Penicillins; and Sertraline hcl  Home Medications   Current Outpatient Rx  Name Route Sig Dispense Refill  . ACETAMINOPHEN 500 MG PO TABS Oral Take 1 tablet (500 mg total) by mouth every 6 (six) hours as needed for pain. 100 tablet 2  . ALBUTEROL SULFATE HFA 108 (90 BASE) MCG/ACT IN AERS Inhalation Inhale 2 puffs into the lungs every 6 (six) hours as  needed for wheezing. 1 Inhaler 0  . BRIMONIDINE TARTRATE 0.15 % OP SOLN Left Eye Place 1 drop into the left eye every 12 (twelve) hours.     . CHOLECALCIFEROL 1000 UNITS PO TABS Oral Take 1,000 Units by mouth daily.      Marland Kitchen DOCUSATE SODIUM 100 MG PO CAPS Oral Take 100 mg by mouth daily.      . DORZOLAMIDE HCL-TIMOLOL MAL 22.3-6.8 MG/ML OP SOLN Both Eyes Place 1 drop into both eyes 2 (two) times daily.      Marland Kitchen DOXYCYCLINE HYCLATE 100 MG PO TABS Oral Take 1 tablet (100 mg total) by mouth 2 (two) times daily. 20 tablet 0  . FENTANYL 12 MCG/HR TD PT72 Transdermal Place 1 patch (12.5 mcg total) onto the skin every 3 (three) days. 10 patch 0  . FLUOCINOLONE ACETONIDE SCALP 0.01 % EX OIL      . GABAPENTIN 300 MG PO CAPS Oral Take 900 mg by mouth 2 (two) times daily.    Marland Kitchen HYDROCODONE-ACETAMINOPHEN 5-325 MG PO TABS Oral Take 1 tablet by mouth every 6 (six)  hours as needed.    Marland Kitchen LATANOPROST 0.005 % OP SOLN Both Eyes Place 1 drop into both eyes at bedtime.      Marland Kitchen LETROZOLE 2.5 MG PO TABS Oral Take 1 tablet (2.5 mg total) by mouth every evening. 30 tablet 6  . LEVOTHYROXINE SODIUM 88 MCG PO TABS Oral Take 1 tablet (88 mcg total) by mouth daily. 30 tablet 6  . DAILY MULTIVITAMINS/IRON PO Oral Take by mouth daily.    . OXYCODONE-ACETAMINOPHEN 5-325 MG PO TABS  Take as needed    . PILOCARPINE HCL 1 % OP SOLN        BP 127/54  Pulse 70  Temp 98.2 F (36.8 C) (Oral)  Resp 16  SpO2 91%  Physical Exam  Constitutional: Vital signs are normal. No distress.  HENT:  Head: Normocephalic and atraumatic.  Nose: Nose normal.  Mouth/Throat: Uvula is midline, oropharynx is clear and moist and mucous membranes are normal.  Eyes: EOM are normal. Pupils are equal, round, and reactive to light.  Neck: Neck supple. No mass and no thyromegaly present.  Cardiovascular: Normal rate, regular rhythm, S1 normal and S2 normal.   Murmur heard.  Crescendo decrescendo systolic murmur is present with a grade of 2/6  Pulses:      Radial pulses are 2+ on the right side, and 2+ on the left side.       Dorsalis pedis pulses are 2+ on the right side, and 2+ on the left side.       Posterior tibial pulses are 2+ on the right side, and 2+ on the left side.  Pulmonary/Chest: Effort normal. She has wheezes (throughout).  Abdominal: Bowel sounds are normal. She exhibits no mass. There is no hepatosplenomegaly. There is no tenderness.  Musculoskeletal:       Right lower leg: She exhibits edema (trace).       Left lower leg: She exhibits edema (trace).  Lymphadenopathy:    She has no cervical adenopathy.  Neurological: She is alert. She has normal strength. No cranial nerve deficit or sensory deficit. GCS eye subscore is 4. GCS verbal subscore is 5. GCS motor subscore is 6.  Skin:       ED Course  Procedures (including critical care time)  4:53 PM - pt reassesed.  She  is resting comfortably in bed.  RN reports pt desatruated off of oxygen, 97 on  2L [place]. Expiratory wheezes in all lung fields on exam.  6:26 PM - pt reassessed. Hemodynamically stable. Resting comfortably in bed.  Labs Reviewed  BASIC METABOLIC PANEL - Abnormal; Notable for the following:    Creatinine, Ser 1.22 (*)     GFR calc non Af Amer 39 (*)     GFR calc Af Amer 45 (*)     All other components within normal limits  PROTIME-INR - Abnormal; Notable for the following:    Prothrombin Time 23.3 (*)     INR 2.03 (*)     All other components within normal limits  CBC WITH DIFFERENTIAL  CULTURE, BLOOD (ROUTINE X 2)  CULTURE, BLOOD (ROUTINE X 2)   Dg Chest 2 View  01/28/2012  *RADIOLOGY REPORT*  Clinical Data: Cough, wheezing  CHEST - 2 VIEW  Comparison: 01/22/2011  Findings: Cardiomediastinal silhouette is stable.  Elevation of the right hemidiaphragm again noted.  No acute infiltrate or pleural effusion.  No pulmonary edema.  Bony thorax is stable.  IMPRESSION: No active disease.  No significant change.  Original Report Authenticated By: Natasha Mead, M.D.   Dg Tibia/fibula Right  01/28/2012  *RADIOLOGY REPORT*  Clinical Data: Leg pain  RIGHT TIBIA AND FIBULA - 2 VIEW  Comparison: None.  Findings: Four views of the right tibia-fibula submitted. Chondrocalcinosis noted right knee joint.  Sclerotic changes distal right femur probable due to prior avascular necrosis.  No acute fracture or subluxation.  No periosteal reaction or bony erosion.  IMPRESSION: No acute fracture or subluxation.  No periosteal reaction or bony erosion.  Chondrocalcinosis noted right knee joint.  Original Report Authenticated By: Natasha Mead, M.D.     1. Venous stasis ulcer       MDM  1.   Venous stasis ulcer:  On exam, there is no evidence of cellulitis or infection of the ulcer.  CBC w/ differential was normal and patient remains afebrile.  Blood cultures were drawn at presentation and are still pending.  We will  continue her continue the doxycycline as prescribed by her PCP and have her follow up with the wound clinic.  Lollie Sails, MD 01/28/12 [phone]

## 2012-01-28 NOTE — Telephone Encounter (Signed)
Message copied by Anselm Jungling on Wed Jan 28, 2012  9:56 AM ------      Message from: Daphene Calamity      Created: Wed Jan 28, 2012  9:54 AM       Caller: Joyce/Child; Phone Number: [phone]; Message from caller: Daughter calling not with patient other sister is calling 911 and they will be taking the patient to [facility] wanted the Dr.  To know did not want to speak to Rn wanted message sent.

## 2012-01-28 NOTE — Telephone Encounter (Signed)
Noted thanks °

## 2012-01-30 ENCOUNTER — Encounter: Payer: Self-pay | Admitting: Physical Medicine and Rehabilitation

## 2012-01-30 ENCOUNTER — Encounter
Payer: Medicare Other | Attending: Physical Medicine and Rehabilitation | Admitting: Physical Medicine and Rehabilitation

## 2012-01-30 VITALS — BP 119/58 | HR 62 | Resp 14 | Ht 64.0 in | Wt 168.0 lb

## 2012-01-30 DIAGNOSIS — L97909 Non-pressure chronic ulcer of unspecified part of unspecified lower leg with unspecified severity: Secondary | ICD-10-CM

## 2012-01-30 DIAGNOSIS — M79662 Pain in left lower leg: Secondary | ICD-10-CM

## 2012-01-30 DIAGNOSIS — L97809 Non-pressure chronic ulcer of other part of unspecified lower leg with unspecified severity: Secondary | ICD-10-CM | POA: Insufficient documentation

## 2012-01-30 DIAGNOSIS — M79609 Pain in unspecified limb: Secondary | ICD-10-CM | POA: Insufficient documentation

## 2012-01-30 DIAGNOSIS — G8929 Other chronic pain: Secondary | ICD-10-CM | POA: Insufficient documentation

## 2012-01-30 MED ORDER — OXYCODONE-ACETAMINOPHEN 5-325 MG PO TABS: 1.0000 | ORAL_TABLET | ORAL | Status: DC | PRN

## 2012-01-30 NOTE — Patient Instructions (Signed)
Follow up with Dr. Imogene Burn, vascular specialist, and with wound care

## 2012-01-30 NOTE — Progress Notes (Signed)
Subjective:    Patient ID: Jennifer Brennan, female    DOB: 1924-06-12, 78 y.o.   MRN: [id number]  HPI The patient complains about right lower leg pain, which has gotten worse in the last 3-4 weeks. She has had chronic erythema, swelling and some pain in her right lower leg for a while, but the pain, the swelling, erythema has increased in the last 3-4 weeks. She also complains about an open ulcer on her right lower leg. She reports that she has been on 3 different antibiotics so far, without much success. At the moment she is on doxycycline.  Pain Inventory Average Pain 8 Pain Right Now 0 My pain is sharp  In the last 24 hours, has pain interfered with the following? General activity 10 Relation with others 6 Enjoyment of life 0 What TIME of day is your pain at its worst? varies Sleep (in general) Fair  Pain is worse with: walking, sitting and standing Pain improves with: rest and medication Relief from Meds: 4  Mobility walk with assistance use a cane use a walker ability to climb steps?  no do you drive?  no  Function disabled: date disabled  I need assistance with the following:  dressing, bathing, toileting, meal prep, household duties and shopping  Neuro/Psych bladder control problems trouble walking confusion  Prior Studies Any changes since last visit?  no  Physicians involved in your care Any changes since last visit?  no   Family History  Problem Relation Age of Onset  . Ovarian cancer Mother   . Arthritis Other     grandparents  . Lung cancer Other   . Heart disease Other     parent   History   Social History  . Marital Status: Married    Spouse Name: N/A    Number of Children: N/A  . Years of Education: N/A   Social History Main Topics  . Smoking status: Former Smoker    Quit date: 10/16/1979  . Smokeless tobacco: Never Used  . Alcohol Use: No  . Drug Use: No  . Sexually Active: None   Other Topics Concern  . None   Social History  Narrative   Retired Married     Never Smoked     Alcohol use-no              Past Surgical History  Procedure Date  . Total abdominal hysterectomy   . Total hip arthroplasty 10/2006    right hip  . Refractive surgery     B/L  . Breast surgery 03/2010    breast biopsy, L mastectomy   . Tonsillectomy    Past Medical History  Diagnosis Date  . Hypertension   . Hypothyroidism   . Seizure disorder   . History of cerebrovascular accident   . Severe stage glaucoma     legally blind  . Depression with anxiety   . OAB (overactive bladder)   . ADENOCARCINOMA, LEFT BREAST 04/11/2010    s/p L mastectomy, on femara x 34yr  . BACK PAIN, LUMBAR, CHRONIC   . DVT 05/2007    RLE following R THR - chronic coumadin, chronic RLE pain  . GLAUCOMA   . OSTEOARTHRITIS, HANDS, BILATERAL   . OSTEOPENIA   . Retinal ischemia   . Sciatica of right side     chronic RLE pain, multifactorial - MRI T/L spine 02/2011  . VITAMIN D DEFICIENCY dx 08/2008   BP 119/58  Pulse 62  Resp 14  Ht 5\' 4"  (1.626 m)  Wt 168 lb (76.204 kg)  BMI 28.84 kg/m2  SpO2 88%     Review of Systems  Constitutional: Positive for unexpected weight change.  Respiratory: Positive for wheezing.   Gastrointestinal: Positive for constipation.  Musculoskeletal: Positive for myalgias, back pain, joint swelling, arthralgias and gait problem.  Hematological: Bruises/bleeds easily.  Psychiatric/Behavioral: Positive for confusion.  All other systems reviewed and are negative.       Objective:   Physical Exam She is an obese elderly woman who does not appear in any distress.  Skin is remarkable for integration in the right lower extremity. She has dark discoloration throughout the tibial region. She has some weeping on the medial side .  Her cranial nerves are remarkable for decreased visual acuity. Coordination is grossly intact  Manual muscle testing reveals 4+ over 5 strength in both upper and lower extremities without obvious  focal deficits  Reflexes are diminished overall.  Sensation is intact to light touch in both lower extremities.  Patient in wheel chair. Left dorsalis pedis is intact and easily palpated  Right dorsalis pedis is not present. Erythema, swelling , ulcer on right lower leg.        Assessment & Plan:  Chronic right low leg pain with decreased pulse and right lower extremity skin changes with an ulcer on the medial side.  Patient will follow up with a vascular specialist on 02/06/2012, and wound care on 02/10/2012. Patient is 4 days on Doxicycline, 10 day course. Got Dr. Riley Kill in the room, to take a look at her leg, he stated that the patient should follow up with vascular specialist, and should go to the ED if her Sx get worse ( fever, more redness or pain). Refilled her Oxycodone 5mg  1 tablet /day for breakthrough pain, Dr. Carleene Cooper prescribed her Fentanyl patches. Follow up with [place] in 1 month

## 2012-02-02 ENCOUNTER — Ambulatory Visit (INDEPENDENT_AMBULATORY_CARE_PROVIDER_SITE_OTHER): Payer: Medicare Other | Admitting: *Deleted

## 2012-02-02 DIAGNOSIS — I639 Cerebral infarction, unspecified: Secondary | ICD-10-CM

## 2012-02-02 DIAGNOSIS — I6350 Cerebral infarction due to unspecified occlusion or stenosis of unspecified cerebral artery: Secondary | ICD-10-CM

## 2012-02-02 DIAGNOSIS — I82409 Acute embolism and thrombosis of unspecified deep veins of unspecified lower extremity: Secondary | ICD-10-CM

## 2012-02-02 LAB — POCT INR: INR: 2.9

## 2012-02-03 ENCOUNTER — Telehealth: Payer: Self-pay | Admitting: Neurology

## 2012-02-03 ENCOUNTER — Encounter: Payer: Self-pay | Admitting: Internal Medicine

## 2012-02-03 ENCOUNTER — Ambulatory Visit (INDEPENDENT_AMBULATORY_CARE_PROVIDER_SITE_OTHER): Payer: Medicare Other | Admitting: Internal Medicine

## 2012-02-03 VITALS — BP 120/72 | HR 66 | Temp 98.3°F

## 2012-02-03 DIAGNOSIS — M79661 Pain in right lower leg: Secondary | ICD-10-CM

## 2012-02-03 DIAGNOSIS — L97909 Non-pressure chronic ulcer of unspecified part of unspecified lower leg with unspecified severity: Secondary | ICD-10-CM

## 2012-02-03 DIAGNOSIS — M79609 Pain in unspecified limb: Secondary | ICD-10-CM

## 2012-02-03 DIAGNOSIS — R609 Edema, unspecified: Secondary | ICD-10-CM

## 2012-02-03 DIAGNOSIS — I83019 Varicose veins of right lower extremity with ulcer of unspecified site: Secondary | ICD-10-CM | POA: Insufficient documentation

## 2012-02-03 DIAGNOSIS — L97919 Non-pressure chronic ulcer of unspecified part of right lower leg with unspecified severity: Secondary | ICD-10-CM

## 2012-02-03 MED ORDER — FUROSEMIDE 20 MG PO TABS: 20.0000 mg | ORAL_TABLET | Freq: Two times a day (BID) | ORAL | Status: DC

## 2012-02-03 MED ORDER — SULFAMETHOXAZOLE-TRIMETHOPRIM 800-160 MG PO TABS: 1.0000 | ORAL_TABLET | Freq: Two times a day (BID) | ORAL | Status: AC

## 2012-02-03 NOTE — Assessment & Plan Note (Signed)
Chronic pain, multifactorial: History of DVT December 2008 right lower extremity - on chronic Coumadin - Arthritic changes with history of right total hip replacement May 2008 - continued groin pain (chronic) Status post vascular evaluation January 2009 due to pain -"not circulatory issue" per family (unable to located records in EMR), but reeval by [facility] pending for this week - scheduled ABIs  Also, periodic orthopedic evaluation for sciatica; MRI thoracic and lumbar spine done September 2012 Drs Francis Dowse and Alvester Morin - s/p ESI in early 2013, some relief of back pain but not RLE pain -  generally intolerant of narcotics due to dementia, but on low dose fenantyl + prn oxy IR - reviewed subsquent neuro side effects: sedation and confusion Titratating gabapentin and continue scheduled Tylenol S/p 01/2012 eval by pain mgmt -

## 2012-02-03 NOTE — Progress Notes (Signed)
Subjective:    Patient ID: Jennifer Brennan, female    DOB: Sep 26, 1923, 78 y.o.   MRN: [id number]  HPI here for follow up - RLE venous ulcer Seen x 2 here in past 2 weeks for same - also ER visit 48h ago Onset >2 weeks ago, not improved with elevation and guaze wrapping Improved while on Septra, but recurrent drainage when antibiotics stopped Started on doxy 5 days ago - not improved Also associated with increased leg pain, swelling R>L leg and increasing size wound per family Seen by pain clinic yesterday - now on oxy IR for breakthru in addition to Fentanyl patch No fever - no other swelling or shortness of breath  pending Vasc eval 8/16 and WOC eval 8/20   Past Medical History  Diagnosis Date  . Hypertension   . Hypothyroidism   . Seizure disorder   . History of cerebrovascular accident   . Severe stage glaucoma     legally blind  . Depression with anxiety   . OAB (overactive bladder)   . ADENOCARCINOMA, LEFT BREAST 04/11/2010    s/p L mastectomy, on femara x 74yr  . BACK PAIN, LUMBAR, CHRONIC   . DVT 05/2007    RLE following R THR - chronic coumadin, chronic RLE pain  . GLAUCOMA   . OSTEOARTHRITIS, HANDS, BILATERAL   . OSTEOPENIA   . Retinal ischemia   . Sciatica of right side     chronic RLE pain, multifactorial - MRI T/L spine 02/2011  . VITAMIN D DEFICIENCY dx 08/2008  . Venous ulcer of right lower extremity with varicose veins     Review of Systems  Constitutional: Positive for chills and fatigue. Negative for fever and unexpected weight change.  Respiratory: Negative for cough and shortness of breath.   Cardiovascular: Positive for leg swelling. Negative for chest pain and palpitations.  Skin: Positive for color change and wound (RLE).       Objective:   Physical Exam  BP 120/72  Pulse 66  Temp 98.3 F (36.8 C) (Oral)  SpO2 90% Constitutional: She is overweight but appears well-developed and well-nourished. Sitting in WC; No distress. dtr and spouse at  side Cardiovascular: Normal rate, regular rhythm and normal heart sounds.  No murmur heard. R>L LE edema, trace-1+. Chronic venous changes distal RLE - see below Pulmonary/Chest: Effort normal, breath sounds diminished at bases. No respiratory distress. She has no exp wheezes.  Mskel: Right lower extremity without deformity or effusion. No swelling of joints. Full range of motion active and passive in knee, ankle and toes. Skin: chronic RLE distal venous changes/darkening with slight erythema.- small ulcer at medial side of distal RLE, chronically tender to touch from knee posterior calf, ankle and foot; yellow weeping and "scab" at 2 other locations. Remaining skin is warm and dry. No rash noted.    Lab Results  Component Value Date   WBC 8.6 01/28/2012   HGB 13.8 01/28/2012   HCT 40.5 01/28/2012   PLT 193 01/28/2012   GLUCOSE 96 01/28/2012   CHOL 201* 01/23/2011   TRIG 112 01/23/2011   HDL 64 01/23/2011   LDLCALC 782* 01/23/2011   ALT 16 08/19/2011   AST 21 08/19/2011   NA 140 01/28/2012   K 4.3 01/28/2012   CL 106 01/28/2012   CREATININE 1.22* 01/28/2012   BUN 18 01/28/2012   CO2 24 01/28/2012   TSH 0.11* 05/07/2011   INR 2.9 02/02/2012   HGBA1C 5.7 08/06/2007      Assessment &  Plan:   See problem list. Medications and labs reviewed today.  Edema - R>L LE - no other signs of volume overload - suspect inflammatory to acute process with ulceration of RLE - add lasix bid x 3 days, then qd - recent labs from ER reviewed

## 2012-02-03 NOTE — Assessment & Plan Note (Addendum)
RLE venous stasis ulcer - no definitive cellulitis or infection on exam but increasing size of wound with edema initially improved on Septra course x 10d, but caused increase INR Then started doxy 5 days ago due to recurrent drainage - ongoing doxy now, but will change back to Septra at dtr pref -  Afeb and normal WBC in ER 2 days ago - VS stable now also lasix for associated edema continue wound care and neosporin  advised on interaction with coumadin but feel potential risk outweighed by potential benefit  follow up with [facility] 02/06/12 (note scheduled ABIs) and WOC 8/20 Also reviewed ER visit 8/7 and pain clinic eval 8/12  The patient/family is reassured that these symptoms do not require hospitalization at this time

## 2012-02-03 NOTE — Patient Instructions (Signed)
It was good to see you today. Stop doxycycline antibiotics and resume septra 2x/day for leg wound. Use lasix 2x/day for fluid x 3 days, then once daily (or as directed) Your prescription(s) have been submitted to your pharmacy. Please take as directed and contact our office if you believe you are having problem(s) with the medication(s). Occasional "clear weeping" is normal - keep washed, wrapped and covered with neosproin as discussed - but call if any increased redness,uncontrollable pain or fever - or if the "tears" are cloudy in color rather than clear if your symptoms continue to worsen (uncontrollable pain, fever, etc), or if you are unable take anything by mouth (pills, fluids, etc), you should go to the emergency room for further evaluation and treatment. Other Medications reviewed, no changes at this time.keep visits with other specialists as reviewed today

## 2012-02-03 NOTE — Telephone Encounter (Signed)
Pt's contact called to check on appointments. LM on voicemail stating that they were supposed to see Dr. Modesto Charon back in 3 months. Her last appointment was on 01/14/2012, so 3 months would be in October. Is this patient supposed to fu with you at [facility]?

## 2012-02-04 LAB — CULTURE, BLOOD (ROUTINE X 2): Culture: NO GROWTH

## 2012-02-04 NOTE — Telephone Encounter (Signed)
Attempted to call back Torrie Mayers. No answer and no machine at number given. Will try back again later.

## 2012-02-04 NOTE — Telephone Encounter (Signed)
If they would like to see me at [facility] that would be ok.  However, her seizures are relatively quiescent and I think they could just follow up with their pcp as well.  It's up to them. of course if it is too far a distance they could see someone here - you could suggest [facility].

## 2012-02-05 ENCOUNTER — Encounter: Payer: Self-pay | Admitting: Vascular Surgery

## 2012-02-06 ENCOUNTER — Ambulatory Visit (INDEPENDENT_AMBULATORY_CARE_PROVIDER_SITE_OTHER): Payer: Medicare Other | Admitting: Vascular Surgery

## 2012-02-06 ENCOUNTER — Encounter (INDEPENDENT_AMBULATORY_CARE_PROVIDER_SITE_OTHER): Payer: Medicare Other

## 2012-02-06 ENCOUNTER — Encounter: Payer: Self-pay | Admitting: Vascular Surgery

## 2012-02-06 ENCOUNTER — Ambulatory Visit (INDEPENDENT_AMBULATORY_CARE_PROVIDER_SITE_OTHER): Payer: Medicare Other | Admitting: Pharmacist

## 2012-02-06 VITALS — BP 136/76 | HR 74 | Resp 18 | Ht 64.0 in | Wt 160.0 lb

## 2012-02-06 DIAGNOSIS — M79606 Pain in leg, unspecified: Secondary | ICD-10-CM | POA: Insufficient documentation

## 2012-02-06 DIAGNOSIS — I739 Peripheral vascular disease, unspecified: Secondary | ICD-10-CM

## 2012-02-06 DIAGNOSIS — I87009 Postthrombotic syndrome without complications of unspecified extremity: Secondary | ICD-10-CM | POA: Insufficient documentation

## 2012-02-06 DIAGNOSIS — I82409 Acute embolism and thrombosis of unspecified deep veins of unspecified lower extremity: Secondary | ICD-10-CM

## 2012-02-06 DIAGNOSIS — M79609 Pain in unspecified limb: Secondary | ICD-10-CM

## 2012-02-06 DIAGNOSIS — L97209 Non-pressure chronic ulcer of unspecified calf with unspecified severity: Secondary | ICD-10-CM

## 2012-02-06 DIAGNOSIS — I872 Venous insufficiency (chronic) (peripheral): Secondary | ICD-10-CM

## 2012-02-06 DIAGNOSIS — I639 Cerebral infarction, unspecified: Secondary | ICD-10-CM

## 2012-02-06 DIAGNOSIS — M7989 Other specified soft tissue disorders: Secondary | ICD-10-CM

## 2012-02-06 DIAGNOSIS — R0989 Other specified symptoms and signs involving the circulatory and respiratory systems: Secondary | ICD-10-CM

## 2012-02-06 DIAGNOSIS — I6350 Cerebral infarction due to unspecified occlusion or stenosis of unspecified cerebral artery: Secondary | ICD-10-CM

## 2012-02-06 LAB — POCT INR
INR: 3.3
INR: 3.3

## 2012-02-06 NOTE — Progress Notes (Signed)
[facility] [place]  Referred by:  Newt Lukes, MD [address] [place], [place] [address]  Reason for referral: Swollen bilateral legs  History of Present Illness  Jennifer Brennan is a 78 y.o. ([birth year]) female who presents with chief complaint: B swollen legs with R leg ulcer.  Patient notes onset of swelling several months ago, followed by R medial ulcer formation.  The patient has known history of DVT, no history of varicose vein, no history of venous stasis ulcers, no history of  Lymphedema and no history of skin changes in lower legs.  There is no family history of venous disorders.  The patient has used compression stockings in the past: 15-20 mm Hg.  Pt has had recurrent episodes of cellulitis and continue skin breakdown in the medial aspect of her right leg.  She denies any toe gangrene or ischemia.  She has pain in the right calf wound with ambulation.  No rest pain per se.  Pt is already on abx for cellulitis  Past Medical History  Diagnosis Date  . Hypertension   . Hypothyroidism   . Seizure disorder   . History of cerebrovascular accident   . Severe stage glaucoma     legally blind  . Depression with anxiety   . OAB (overactive bladder)   . ADENOCARCINOMA, LEFT BREAST 04/11/2010    s/p L mastectomy, on femara x 55yr  . BACK PAIN, LUMBAR, CHRONIC   . DVT 05/2007    RLE following R THR - chronic coumadin, chronic RLE pain  . GLAUCOMA   . OSTEOARTHRITIS, HANDS, BILATERAL   . OSTEOPENIA   . Retinal ischemia   . Sciatica of right side     chronic RLE pain, multifactorial - MRI T/L spine 02/2011  . VITAMIN D DEFICIENCY dx 08/2008  . Venous ulcer of right lower extremity with varicose veins     Past Surgical History  Procedure Date  . Total abdominal hysterectomy   . Total hip arthroplasty 10/2006    right hip  . Refractive surgery     B/L  . Breast surgery 03/2010    breast biopsy, L mastectomy   .  Tonsillectomy     History   Social History  . Marital Status: Married    Spouse Name: N/A    Number of Children: N/A  . Years of Education: N/A   Occupational History  . Not on file.   Social History Main Topics  . Smoking status: Former Smoker -- 45 years    Types: Cigarettes    Quit date: 10/16/1979  . Smokeless tobacco: Never Used  . Alcohol Use: No  . Drug Use: No  . Sexually Active: Not on file   Other Topics Concern  . Not on file   Social History Narrative   Retired Married     Never Smoked     Alcohol use-no               Family History  Problem Relation Age of Onset  . Ovarian cancer Mother   . Cancer Mother   . Deep vein thrombosis Mother   . Heart disease Mother   . Arthritis Other     grandparents  . Lung cancer Other   . Heart disease Other     parent  . Cancer Father     BRAIN TUMOR  . Cancer Brother   . Hyperlipidemia Brother   . Hypertension Brother   .  Stroke Brother   . Arthritis Brother   . Diabetes Daughter   . Heart disease Daughter   . Hypertension Daughter   . Hyperlipidemia Daughter   . Other Daughter     VARICOSE VEINS    Current Outpatient Prescriptions on File Prior to Visit  Medication Sig Dispense Refill  . acetaminophen (TYLENOL) 500 MG tablet Take 500 mg by mouth every 6 (six) hours as needed.      Marland Kitchen albuterol (PROVENTIL HFA;VENTOLIN HFA) 108 (90 BASE) MCG/ACT inhaler Inhale 2 puffs into the lungs every 6 (six) hours as needed.      . brimonidine (ALPHAGAN) 0.15 % ophthalmic solution Place 1 drop into both eyes every 12 (twelve) hours.       . Cholecalciferol 1000 UNITS tablet Take 1,000 Units by mouth daily.        . diphenhydrAMINE (BENADRYL) 25 MG tablet Take 25 mg by mouth as directed. Take with oxycodone to reduce itching      . docusate sodium (COLACE) 100 MG capsule Take 100 mg by mouth daily.        . dorzolamide-timolol (COSOPT) 22.3-6.8 MG/ML ophthalmic solution Place 1 drop into both eyes 2 (two) times daily.         . fentaNYL (DURAGESIC - DOSED MCG/HR) 12 MCG/HR Place 1 patch onto the skin every 3 (three) days.      Marland Kitchen FLUOCINOLONE ACETONIDE SCALP 0.01 % external oil       . furosemide (LASIX) 20 MG tablet Take 1 tablet (20 mg total) by mouth 2 (two) times daily. X 3 days, then 20mg  once daily (or as directed)  60 tablet  1  . gabapentin (NEURONTIN) 300 MG capsule Take 900 mg by mouth 2 (two) times daily.      Marland Kitchen latanoprost (XALATAN) 0.005 % ophthalmic solution Place 1 drop into both eyes at bedtime.        Marland Kitchen letrozole (FEMARA) 2.5 MG tablet Take 2.5 mg by mouth every evening.      . levETIRAcetam (KEPPRA XR) 500 MG 24 hr tablet Take 1,000 mg by mouth at bedtime.      Marland Kitchen levothyroxine (SYNTHROID, LEVOTHROID) 88 MCG tablet Take 88 mcg by mouth daily.      Marland Kitchen NIFEdipine (PROCARDIA-XL/ADALAT-CC/NIFEDICAL-XL) 30 MG 24 hr tablet Take 30 mg by mouth daily.      Marland Kitchen omeprazole (PRILOSEC) 20 MG capsule Take 20 mg by mouth 2 (two) times daily.      Marland Kitchen oxyCODONE-acetaminophen (PERCOCET/ROXICET) 5-325 MG per tablet Take 2 tablets by mouth as needed. Take 1 tablet per day as needed for pain      . pilocarpine (PILOCAR) 1 % ophthalmic solution       . solifenacin (VESICARE) 5 MG tablet Take 5 mg by mouth daily.      Marland Kitchen sulfamethoxazole-trimethoprim (SEPTRA DS) 800-160 MG per tablet Take 1 tablet by mouth 2 (two) times daily.  14 tablet  0  . valsartan (DIOVAN) 80 MG tablet Take 80 mg by mouth daily.      Marland Kitchen warfarin (COUMADIN) 5 MG tablet Take 5 mg by mouth as directed. Take 5 mg every day except Friday. On Friday take 2.5 mg (1/2 tablet) .      Marland Kitchen Multiple Vitamins-Iron (DAILY MULTIVITAMINS/IRON PO) Take by mouth daily.      Marland Kitchen DISCONTD: acetaminophen (TYLENOL) 500 MG tablet Take 1 tablet (500 mg total) by mouth every 6 (six) hours as needed for pain.  100 tablet  2  Allergies  Allergen Reactions  . Morphine Sulfate     REACTION: rash: ITCHING  . Penicillins     REACTION: urticaria (hives)  . Sertraline Hcl      REACTION: rash    REVIEW OF SYSTEMS:  (Positives checked otherwise negative)  CARDIOVASCULAR:  [ ]  chest pain, [ ]  chest pressure, [ ]  palpitations, [ ]  shortness of breath when laying flat, [ ]  shortness of breath with exertion, [x]  pain in feet when walking, [x]  pain in feet when laying flat, [x]  history of blood clot in veins (DVT), [ ]  history of phlebitis, [x]  swelling in legs, [x]  varicose veins  PULMONARY:  [x]  productive cough, [x]  asthma, [x]  wheezing  NEUROLOGIC:  [x]  weakness in arms or legs, [x]  numbness in arms or legs, [ ]  difficulty speaking or slurred speech, [ ]  temporary loss of vision in one eye, [ ]  dizziness  HEMATOLOGIC:  [ ]  bleeding problems, [x]  problems with blood clotting too easily  MUSCULOSKEL:  [ ]  joint pain, [ ]  joint swelling  GASTROINTEST:  [ ]   Vomiting blood, [ ]   Blood in stool     GENITOURINARY:  [ ]   Burning with urination, [ ]   Blood in urine  PSYCHIATRIC:  [x]  history of major depression  INTEGUMENTARY:  [x]  rashes, [x]  ulcers  CONSTITUTIONAL:  [ ]  fever, [x]  chills  Physical Examination  Filed Vitals:   02/06/12 1103  BP: 136/76  Pulse: 74  Resp: 18  Height: 5\' 4"  (1.626 m)  Weight: 160 lb (72.576 kg)   Body mass index is 27.46 kg/(m^2).  General: A&O x 3, elderly, ill appearing  Head: [place]/AT  Ear/Nose/Throat: Hearing grossly intact, nares w/o erythema or drainage, oropharynx w/o Erythema/Exudate  Eyes: PERRLA, EOMI  Neck: Supple, no nuchal rigidity, no palpable LAD  Pulmonary: Sym exp, good air movt, CTAB, no rales, rhonchi, & wheezing  Cardiac: RRR, Nl S1, S2, no Murmurs, rubs or gallops  Vascular: Vessel Right Left  Radial Palpable Palpable  Ulnar Palpable Palpable  Brachial Palpable Palpable  Carotid Palpable, without bruit Palpable, without bruit  Aorta Not palpable N/A  Femoral Palpable Palpable  Popliteal Not palpable Not palpable  PT Palpable Non Palpable  DP Palpable Palpable   Gastrointestinal: soft,  NTND, -G/R, - HSM, - masses, - CVAT B  Musculoskeletal: M/S 5/5 throughout , Extremities without ischemic changes , desquamation of skin in medial calf with serous drainage from blisters, erythema and light TTP in R calf, RLE 2+ edema, LLE 1+ edema, RLE LDS  Neurologic: CN 2-12 intact , Pain and light touch intact in extremities , Motor exam as listed above  Psychiatric: Judgment intact, Mood & affect appropriate for pt's clinical situation  Dermatologic: See M/S exam for extremity exam, no rashes otherwise noted  Lymph : No Cervical, Axillary, or Inguinal lymphadenopathy   Non-Invasive Vascular Imaging  ABI (Date: 02/06/12)  RLE: Not done due to pain, PT and DP: biphasic, TBI 0.72  LLE: 1.10, PT: monophasic, DP: biphasic, TBI 0.78  RLE Venous Insufficiency Duplex (Date: 02/06/12):   Enlarge LN in right groin  No DVT from groin to knee, could not evaluate calf due to pain from ulcer  Mild incompetent in R CFV  Competent R GSV and SSV  Pulsatile venous flow c/w fluid overload  Outside Studies/Documentation 8 pages of outside documents were reviewed including: clinic work-up.  Medical Decision Making  ESSA WENK is a 78 y.o. 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   Based on the patient's history and examination, I recommend: cont PO abx.  If not responsive, may need IV abx.  The patient will need compressive therapy, including possible unna boot to R leg.  She already has a wound care clinic referral with a scheduled appt.  I suspect the wound care team will agree with my assessment.    I will defer wound care to them given their access to a wider variety of modalities to get her to heal up her wound.  She will likely need 20-30 mm Hg compression once she heals up her wound.  Evaluation for a systemic source  of fluid overload also recommended, given bilateral edema and waveforms on venous duplex c/w CHF.  Fortunately, her PAD is relatively limited with biphasic blood flow to the leg with the wound.  Both legs have normal TBI suggesting adequate flow down to the level of the toes bilaterally.  We will have her come back in 3 months to recheck the wound and repeat the BLE ABI.  Thank you for allowing [place] to participate in this patient's care.  Leonides Sake, MD [facility] Office: [phone] Pager: [phone]  02/06/2012, 11:42 AM

## 2012-02-06 NOTE — Addendum Note (Signed)
Addended by: Sharee Pimple on: 02/06/2012 12:56 PM   Modules accepted: Orders

## 2012-02-10 ENCOUNTER — Inpatient Hospital Stay (HOSPITAL_COMMUNITY)
Admission: EM | Admit: 2012-02-10 | Discharge: 2012-02-14 | DRG: 300 | Disposition: A | Discharge status: Home Health | Payer: Medicare Other | Attending: Internal Medicine | Admitting: Internal Medicine

## 2012-02-10 ENCOUNTER — Encounter (HOSPITAL_COMMUNITY): Payer: Self-pay | Admitting: *Deleted

## 2012-02-10 ENCOUNTER — Inpatient Hospital Stay (HOSPITAL_COMMUNITY): Payer: Medicare Other

## 2012-02-10 ENCOUNTER — Encounter (HOSPITAL_BASED_OUTPATIENT_CLINIC_OR_DEPARTMENT_OTHER): Payer: Medicare Other

## 2012-02-10 DIAGNOSIS — I83019 Varicose veins of right lower extremity with ulcer of unspecified site: Secondary | ICD-10-CM | POA: Diagnosis present

## 2012-02-10 DIAGNOSIS — L98499 Non-pressure chronic ulcer of skin of other sites with unspecified severity: Secondary | ICD-10-CM

## 2012-02-10 DIAGNOSIS — I5030 Unspecified diastolic (congestive) heart failure: Secondary | ICD-10-CM | POA: Diagnosis present

## 2012-02-10 DIAGNOSIS — I509 Heart failure, unspecified: Secondary | ICD-10-CM | POA: Diagnosis present

## 2012-02-10 DIAGNOSIS — Z86718 Personal history of other venous thrombosis and embolism: Secondary | ICD-10-CM

## 2012-02-10 DIAGNOSIS — G40909 Epilepsy, unspecified, not intractable, without status epilepticus: Secondary | ICD-10-CM | POA: Diagnosis present

## 2012-02-10 DIAGNOSIS — I10 Essential (primary) hypertension: Secondary | ICD-10-CM | POA: Diagnosis present

## 2012-02-10 DIAGNOSIS — M7989 Other specified soft tissue disorders: Secondary | ICD-10-CM

## 2012-02-10 DIAGNOSIS — R413 Other amnesia: Secondary | ICD-10-CM

## 2012-02-10 DIAGNOSIS — M545 Low back pain, unspecified: Secondary | ICD-10-CM | POA: Diagnosis present

## 2012-02-10 DIAGNOSIS — I87009 Postthrombotic syndrome without complications of unspecified extremity: Secondary | ICD-10-CM

## 2012-02-10 DIAGNOSIS — M19049 Primary osteoarthritis, unspecified hand: Secondary | ICD-10-CM

## 2012-02-10 DIAGNOSIS — L97909 Non-pressure chronic ulcer of unspecified part of unspecified lower leg with unspecified severity: Secondary | ICD-10-CM | POA: Diagnosis present

## 2012-02-10 DIAGNOSIS — Z7901 Long term (current) use of anticoagulants: Secondary | ICD-10-CM

## 2012-02-10 DIAGNOSIS — L0291 Cutaneous abscess, unspecified: Secondary | ICD-10-CM

## 2012-02-10 DIAGNOSIS — N318 Other neuromuscular dysfunction of bladder: Secondary | ICD-10-CM

## 2012-02-10 DIAGNOSIS — M79661 Pain in right lower leg: Secondary | ICD-10-CM

## 2012-02-10 DIAGNOSIS — C50919 Malignant neoplasm of unspecified site of unspecified female breast: Secondary | ICD-10-CM

## 2012-02-10 DIAGNOSIS — E559 Vitamin D deficiency, unspecified: Secondary | ICD-10-CM

## 2012-02-10 DIAGNOSIS — K219 Gastro-esophageal reflux disease without esophagitis: Secondary | ICD-10-CM | POA: Diagnosis present

## 2012-02-10 DIAGNOSIS — M899 Disorder of bone, unspecified: Secondary | ICD-10-CM

## 2012-02-10 DIAGNOSIS — R531 Weakness: Secondary | ICD-10-CM | POA: Diagnosis present

## 2012-02-10 DIAGNOSIS — H548 Legal blindness, as defined in USA: Secondary | ICD-10-CM | POA: Diagnosis present

## 2012-02-10 DIAGNOSIS — H409 Unspecified glaucoma: Secondary | ICD-10-CM | POA: Diagnosis present

## 2012-02-10 DIAGNOSIS — M79606 Pain in leg, unspecified: Secondary | ICD-10-CM

## 2012-02-10 DIAGNOSIS — I872 Venous insufficiency (chronic) (peripheral): Secondary | ICD-10-CM

## 2012-02-10 DIAGNOSIS — J4489 Other specified chronic obstructive pulmonary disease: Secondary | ICD-10-CM | POA: Diagnosis present

## 2012-02-10 DIAGNOSIS — R5381 Other malaise: Secondary | ICD-10-CM | POA: Diagnosis present

## 2012-02-10 DIAGNOSIS — L0390 Cellulitis, unspecified: Secondary | ICD-10-CM | POA: Diagnosis present

## 2012-02-10 DIAGNOSIS — Z79899 Other long term (current) drug therapy: Secondary | ICD-10-CM

## 2012-02-10 DIAGNOSIS — N179 Acute kidney failure, unspecified: Secondary | ICD-10-CM | POA: Diagnosis present

## 2012-02-10 DIAGNOSIS — I739 Peripheral vascular disease, unspecified: Principal | ICD-10-CM | POA: Diagnosis present

## 2012-02-10 DIAGNOSIS — F341 Dysthymic disorder: Secondary | ICD-10-CM

## 2012-02-10 DIAGNOSIS — N289 Disorder of kidney and ureter, unspecified: Secondary | ICD-10-CM | POA: Diagnosis present

## 2012-02-10 DIAGNOSIS — N182 Chronic kidney disease, stage 2 (mild): Secondary | ICD-10-CM | POA: Diagnosis present

## 2012-02-10 DIAGNOSIS — J449 Chronic obstructive pulmonary disease, unspecified: Secondary | ICD-10-CM | POA: Diagnosis present

## 2012-02-10 DIAGNOSIS — L02419 Cutaneous abscess of limb, unspecified: Secondary | ICD-10-CM | POA: Diagnosis present

## 2012-02-10 DIAGNOSIS — M949 Disorder of cartilage, unspecified: Secondary | ICD-10-CM

## 2012-02-10 DIAGNOSIS — Z1231 Encounter for screening mammogram for malignant neoplasm of breast: Secondary | ICD-10-CM

## 2012-02-10 DIAGNOSIS — I83009 Varicose veins of unspecified lower extremity with ulcer of unspecified site: Secondary | ICD-10-CM

## 2012-02-10 DIAGNOSIS — H3582 Retinal ischemia: Secondary | ICD-10-CM

## 2012-02-10 DIAGNOSIS — R569 Unspecified convulsions: Secondary | ICD-10-CM | POA: Diagnosis present

## 2012-02-10 DIAGNOSIS — I82409 Acute embolism and thrombosis of unspecified deep veins of unspecified lower extremity: Secondary | ICD-10-CM | POA: Diagnosis present

## 2012-02-10 DIAGNOSIS — E039 Hypothyroidism, unspecified: Secondary | ICD-10-CM | POA: Diagnosis present

## 2012-02-10 DIAGNOSIS — I639 Cerebral infarction, unspecified: Secondary | ICD-10-CM

## 2012-02-10 DIAGNOSIS — I129 Hypertensive chronic kidney disease with stage 1 through stage 4 chronic kidney disease, or unspecified chronic kidney disease: Secondary | ICD-10-CM | POA: Diagnosis present

## 2012-02-10 HISTORY — DX: Unspecified dementia, unspecified severity, without behavioral disturbance, psychotic disturbance, mood disturbance, and anxiety: F03.90

## 2012-02-10 LAB — CBC WITH DIFFERENTIAL/PLATELET
Basophils Absolute: 0 10*3/uL (ref 0.0–0.1)
Eosinophils Relative: 7 % — ABNORMAL HIGH (ref 0–5)
Lymphocytes Relative: 27 % (ref 12–46)
Lymphs Abs: 1.5 10*3/uL (ref 0.7–4.0)
Neutro Abs: 3 10*3/uL (ref 1.7–7.7)
Neutrophils Relative %: 55 % (ref 43–77)
Platelets: 225 10*3/uL (ref 150–400)
RBC: 4.5 MIL/uL (ref 3.87–5.11)
RDW: 15.2 % (ref 11.5–15.5)
WBC: 5.5 10*3/uL (ref 4.0–10.5)

## 2012-02-10 LAB — COMPREHENSIVE METABOLIC PANEL
ALT: 12 U/L (ref 0–35)
AST: 16 U/L (ref 0–37)
Albumin: 3 g/dL — ABNORMAL LOW (ref 3.5–5.2)
Chloride: 102 mEq/L (ref 96–112)
Creatinine, Ser: 2.31 mg/dL — ABNORMAL HIGH (ref 0.50–1.10)
Sodium: 137 mEq/L (ref 135–145)
Total Bilirubin: 0.2 mg/dL — ABNORMAL LOW (ref 0.3–1.2)

## 2012-02-10 LAB — URINALYSIS, ROUTINE W REFLEX MICROSCOPIC
Glucose, UA: NEGATIVE mg/dL
Ketones, ur: NEGATIVE mg/dL
Leukocytes, UA: NEGATIVE
Nitrite: NEGATIVE
Specific Gravity, Urine: 1.014 (ref 1.005–1.030)
pH: 5.5 (ref 5.0–8.0)

## 2012-02-10 LAB — PRO B NATRIURETIC PEPTIDE: Pro B Natriuretic peptide (BNP): 547.6 pg/mL — ABNORMAL HIGH (ref 0–450)

## 2012-02-10 LAB — TROPONIN I
Troponin I: <0.3 ng/mL (ref <0.30)
Troponin I: <0.3 ng/mL (ref <0.30)

## 2012-02-10 MED ORDER — VANCOMYCIN HCL IN DEXTROSE 1-5 GM/200ML-% IV SOLN
1000.0000 mg | Freq: Once | INTRAVENOUS | Status: AC
  Administered 2012-02-10: 1000 mg via INTRAVENOUS
  Filled 2012-02-10: qty 200

## 2012-02-10 MED ORDER — LEVETIRACETAM ER 500 MG PO TB24
1000.0000 mg | ORAL_TABLET | Freq: Every day | ORAL | Status: DC
  Administered 2012-02-10 – 2012-02-13 (×4): 1000 mg via ORAL
  Filled 2012-02-10 (×5): qty 2

## 2012-02-10 MED ORDER — BRIMONIDINE TARTRATE 0.15 % OP SOLN
1.0000 [drp] | Freq: Two times a day (BID) | OPHTHALMIC | Status: DC
  Administered 2012-02-10 – 2012-02-14 (×8): 1 [drp] via OPHTHALMIC
  Filled 2012-02-10: qty 5

## 2012-02-10 MED ORDER — ONDANSETRON HCL 4 MG/2ML IJ SOLN: 4.0000 mg | Freq: Three times a day (TID) | INTRAMUSCULAR | Status: DC | PRN

## 2012-02-10 MED ORDER — PILOCARPINE HCL 1 % OP SOLN
1.0000 [drp] | Freq: Three times a day (TID) | OPHTHALMIC | Status: DC
  Administered 2012-02-10 – 2012-02-14 (×11): 1 [drp] via OPHTHALMIC
  Filled 2012-02-10: qty 15

## 2012-02-10 MED ORDER — VITAMIN D3 25 MCG (1000 UNIT) PO TABS
1000.0000 [IU] | ORAL_TABLET | Freq: Every day | ORAL | Status: DC
  Administered 2012-02-11 – 2012-02-14 (×4): 1000 [IU] via ORAL
  Filled 2012-02-10 (×4): qty 1

## 2012-02-10 MED ORDER — LATANOPROST 0.005 % OP SOLN
1.0000 [drp] | Freq: Every day | OPHTHALMIC | Status: DC
  Administered 2012-02-10 – 2012-02-13 (×4): 1 [drp] via OPHTHALMIC
  Filled 2012-02-10: qty 2.5

## 2012-02-10 MED ORDER — LETROZOLE 2.5 MG PO TABS
2.5000 mg | ORAL_TABLET | Freq: Every evening | ORAL | Status: DC
  Administered 2012-02-10 – 2012-02-13 (×4): 2.5 mg via ORAL
  Filled 2012-02-10 (×5): qty 1

## 2012-02-10 MED ORDER — ACETAMINOPHEN 325 MG PO TABS: 650.0000 mg | ORAL_TABLET | Freq: Four times a day (QID) | ORAL | Status: DC | PRN

## 2012-02-10 MED ORDER — OXYCODONE HCL 5 MG PO TABS: 5.0000 mg | ORAL_TABLET | ORAL | Status: DC | PRN

## 2012-02-10 MED ORDER — DARIFENACIN HYDROBROMIDE ER 7.5 MG PO TB24
7.5000 mg | ORAL_TABLET | Freq: Every day | ORAL | Status: DC
Start: 2012-02-11 — End: 2012-02-14
  Administered 2012-02-11 – 2012-02-14 (×4): 7.5 mg via ORAL
  Filled 2012-02-10 (×4): qty 1

## 2012-02-10 MED ORDER — ACETAMINOPHEN 650 MG RE SUPP: 650.0000 mg | Freq: Four times a day (QID) | RECTAL | Status: DC | PRN

## 2012-02-10 MED ORDER — PANTOPRAZOLE SODIUM 40 MG PO TBEC
40.0000 mg | DELAYED_RELEASE_TABLET | Freq: Two times a day (BID) | ORAL | Status: DC
  Administered 2012-02-11 – 2012-02-14 (×7): 40 mg via ORAL
  Filled 2012-02-10 (×9): qty 1

## 2012-02-10 MED ORDER — GABAPENTIN 300 MG PO CAPS
900.0000 mg | ORAL_CAPSULE | Freq: Two times a day (BID) | ORAL | Status: DC
  Administered 2012-02-10 – 2012-02-14 (×8): 900 mg via ORAL
  Filled 2012-02-10 (×9): qty 3

## 2012-02-10 MED ORDER — LEVOTHYROXINE SODIUM 88 MCG PO TABS
88.0000 ug | ORAL_TABLET | Freq: Every day | ORAL | Status: DC
  Administered 2012-02-11 – 2012-02-14 (×4): 88 ug via ORAL
  Filled 2012-02-10 (×7): qty 1

## 2012-02-10 MED ORDER — POLYETHYLENE GLYCOL 3350 17 G PO PACK
17.0000 g | PACK | Freq: Every day | ORAL | Status: DC
  Administered 2012-02-10 – 2012-02-14 (×4): 17 g via ORAL
  Filled 2012-02-10 (×5): qty 1

## 2012-02-10 MED ORDER — DOCUSATE SODIUM 100 MG PO CAPS
100.0000 mg | ORAL_CAPSULE | Freq: Two times a day (BID) | ORAL | Status: DC
  Administered 2012-02-10 – 2012-02-14 (×8): 100 mg via ORAL
  Filled 2012-02-10 (×9): qty 1

## 2012-02-10 MED ORDER — ONDANSETRON HCL 4 MG/2ML IJ SOLN: 4.0000 mg | Freq: Four times a day (QID) | INTRAMUSCULAR | Status: DC | PRN

## 2012-02-10 MED ORDER — IPRATROPIUM-ALBUTEROL 20-100 MCG/ACT IN AERS
1.0000 | INHALATION_SPRAY | Freq: Four times a day (QID) | RESPIRATORY_TRACT | Status: DC | PRN
  Administered 2012-02-11: 2 via RESPIRATORY_TRACT
  Filled 2012-02-10 (×2): qty 4

## 2012-02-10 MED ORDER — SODIUM CHLORIDE 0.9 % IJ SOLN
3.0000 mL | Freq: Two times a day (BID) | INTRAMUSCULAR | Status: DC
  Administered 2012-02-10 – 2012-02-13 (×4): 3 mL via INTRAVENOUS

## 2012-02-10 MED ORDER — ONDANSETRON HCL 4 MG PO TABS: 4.0000 mg | ORAL_TABLET | Freq: Four times a day (QID) | ORAL | Status: DC | PRN

## 2012-02-10 MED ORDER — SODIUM CHLORIDE 0.9 % IV SOLN: INTRAVENOUS | Status: AC

## 2012-02-10 MED ORDER — HYDROMORPHONE HCL PF 1 MG/ML IJ SOLN: 1.0000 mg | INTRAMUSCULAR | Status: DC | PRN

## 2012-02-10 MED ORDER — NIFEDIPINE ER 30 MG PO TB24
30.0000 mg | ORAL_TABLET | Freq: Every day | ORAL | Status: DC
  Administered 2012-02-11 – 2012-02-14 (×4): 30 mg via ORAL
  Filled 2012-02-10 (×4): qty 1

## 2012-02-10 MED ORDER — DORZOLAMIDE HCL-TIMOLOL MAL 2-0.5 % OP SOLN
1.0000 [drp] | Freq: Two times a day (BID) | OPHTHALMIC | Status: DC
  Administered 2012-02-10 – 2012-02-14 (×8): 1 [drp] via OPHTHALMIC
  Filled 2012-02-10: qty 10

## 2012-02-10 MED ORDER — VANCOMYCIN HCL 500 MG IV SOLR
500.0000 mg | INTRAVENOUS | Status: DC
  Administered 2012-02-11 – 2012-02-13 (×3): 500 mg via INTRAVENOUS
  Filled 2012-02-10 (×4): qty 500

## 2012-02-10 NOTE — Progress Notes (Signed)
Marijah Larranaga, is a 78 y.o. female,   MRN: [id number]  -  DOB - 1924-06-16  Outpatient Primary MD for the patient is [name] Paci, MD  in for    Chief Complaint  Patient presents with  . Leg Pain     Blood pressure 116/57, pulse 64, temperature 98.1 F (36.7 C), temperature source Oral, resp. rate 20, SpO2 95.00%.  Principal Problem:  *Acute renal insufficiency Active Problems:  HYPERTENSION  BACK PAIN, LUMBAR, CHRONIC  Venous ulcer of right lower extremity with varicose veins  Peripheral arterial disease  Cellulitis  78 yo presents to ED worsening right LEE, with ulcer draining/oozing. Sent from wound clinic. Has been treated with doxy and bactrim. Also started lasix last week for "weeping". Last 3 days erythema spread to right thigh. Also blisters and redness developing on outer aspect of right calf. Also family reports coughing developed over last 24 hours. No fever, chills, nausea vomiting.   In ED VSS . sats documented 88 once but staff unaware of when these readings taken. Other sats >90%. Chest xray pending. Creatinine 2.3. Non toxic appearing.   Will admit to med/surg.

## 2012-02-10 NOTE — ED Notes (Signed)
Pt was sent from wound center d/t needing IV antibiotics from infection in wound on R foot. R foot wrapped, both legs edematous, reddened, pitting edema present. Pt on lasix d/t increase swelling in legs, started on 13th. Pt denies pain or shortness of breath. Pt does not wear oxygen at home. Per family, pt was complaining of shortness of breath yesterday. Pt is legally blind. Pts family states yellow drainage from wound.

## 2012-02-10 NOTE — Progress Notes (Signed)
Patient was transferred to 4th floor around 8pm tonight.  Telemetry orders were placed by MD and this is a non-telemetry floor so patient had to be transferred.  Day charge RN called doctor to verify orders for telemetry.  Patient was alert and oriented upon transfer with no complaints.  Patient did eat 100% of dinner prior to transfer.

## 2012-02-10 NOTE — ED Provider Notes (Signed)
History     CSN: [id number]  Arrival date & time 02/10/12  1351   First MD Initiated Contact with Patient 02/10/12 1430      Chief Complaint  Patient presents with  . Leg Pain    (Consider location/radiation/quality/duration/timing/severity/associated sxs/prior treatment) Patient is a 78 y.o. female presenting with leg pain. The history is provided by the patient and a relative. No language interpreter was used.  Leg Pain  The incident occurred more than 1 week ago. The incident occurred at home. The pain is present in the right thigh, right leg and left leg. The quality of the pain is described as aching. The pain is at a severity of 0/10. The patient is experiencing no pain. The pain has been intermittent since onset. Associated symptoms include inability to bear weight and tingling. Pertinent negatives include no numbness, no muscle weakness and no loss of sensation. She reports no foreign bodies present.   79 yo female coming in today with right lower extremity venous ulcer times several months. Patient has been in on doxycycline and then 2 g of Bactrim with no improvement. Patient is coming from the wound center today in hopes for admission for IV antibiotics. The right lower extremity ulcer is 6 cm oblong with some clear drainage. Her daughter states that the ulcer has increased in size in the past 2 days. Also states that the redness has gone up her leg to her on both sides right lower stripping and left lower extremity. Patient is on oxycodone so don't fentanyl patch she has not had a bowel movement in 4 days. PCP is Dr. let her she also goes to Dr. Johny Drilling at the [facility]. He has a past medical history of cellulitis DVT hypertension seizures breast cancer. Patient is unable to ambulate for 2 weeks.  Multiple comorbidities.  Patient is blind.    Past Medical History  Diagnosis Date  . Hypertension   . Hypothyroidism   . Seizure disorder   . History of cerebrovascular accident   .  Severe stage glaucoma     legally blind  . Depression with anxiety   . OAB (overactive bladder)   . ADENOCARCINOMA, LEFT BREAST 04/11/2010    s/p L mastectomy, on femara x 81yr  . BACK PAIN, LUMBAR, CHRONIC   . DVT 05/2007    RLE following R THR - chronic coumadin, chronic RLE pain  . GLAUCOMA   . OSTEOARTHRITIS, HANDS, BILATERAL   . OSTEOPENIA   . Retinal ischemia   . Sciatica of right side     chronic RLE pain, multifactorial - MRI T/L spine 02/2011  . VITAMIN D DEFICIENCY dx 08/2008  . Venous ulcer of right lower extremity with varicose veins     Past Surgical History  Procedure Date  . Total abdominal hysterectomy   . Total hip arthroplasty 10/2006    right hip  . Refractive surgery     B/L  . Breast surgery 03/2010    breast biopsy, L mastectomy   . Tonsillectomy     Family History  Problem Relation Age of Onset  . Ovarian cancer Mother   . Cancer Mother   . Deep vein thrombosis Mother   . Heart disease Mother   . Arthritis Other     grandparents  . Lung cancer Other   . Heart disease Other     parent  . Cancer Father     BRAIN TUMOR  . Cancer Brother   . Hyperlipidemia Brother   .  Hypertension Brother   . Stroke Brother   . Arthritis Brother   . Diabetes Daughter   . Heart disease Daughter   . Hypertension Daughter   . Hyperlipidemia Daughter   . Other Daughter     VARICOSE VEINS    History  Substance Use Topics  . Smoking status: Former Smoker -- 45 years    Types: Cigarettes    Quit date: 10/16/1979  . Smokeless tobacco: Never Used  . Alcohol Use: No    OB History    Grav Para Term Preterm Abortions TAB SAB Ect Mult Living                  Review of Systems  Constitutional: Negative for fever.  HENT: Negative.   Eyes: Negative.   Respiratory: Negative.  Negative for shortness of breath.   Cardiovascular: Negative.   Gastrointestinal: Negative.  Negative for nausea and vomiting.  Musculoskeletal: Positive for gait problem.       RLE  pain  Skin:       RLE venous ulcer  Rash/redness to R and L lower ext in the past 2 days.  Neurological: Positive for tingling and weakness. Negative for numbness.  Psychiatric/Behavioral: Negative.   All other systems reviewed and are negative.    Allergies  Morphine sulfate; Penicillins; and Sertraline hcl  Home Medications   Current Outpatient Rx  Name Route Sig Dispense Refill  . ACETAMINOPHEN 500 MG PO TABS Oral Take 500 mg by mouth every 6 (six) hours as needed.    . ALBUTEROL SULFATE HFA 108 (90 BASE) MCG/ACT IN AERS Inhalation Inhale 2 puffs into the lungs every 6 (six) hours as needed.    . ALPHAGAN P 0.1 % OP SOLN      . BRIMONIDINE TARTRATE 0.15 % OP SOLN Both Eyes Place 1 drop into both eyes every 12 (twelve) hours.     . CHOLECALCIFEROL 1000 UNITS PO TABS Oral Take 1,000 Units by mouth daily.      Marland Kitchen DIPHENHYDRAMINE HCL 25 MG PO TABS Oral Take 25 mg by mouth as directed. Take with oxycodone to reduce itching    . DOCUSATE SODIUM 100 MG PO CAPS Oral Take 100 mg by mouth daily.      . DORZOLAMIDE HCL-TIMOLOL MAL 22.3-6.8 MG/ML OP SOLN Both Eyes Place 1 drop into both eyes 2 (two) times daily.      Marland Kitchen DOXYCYCLINE HYCLATE 100 MG PO TABS      . FENTANYL 12 MCG/HR TD PT72 Transdermal Place 1 patch onto the skin every 3 (three) days.    Marland Kitchen FLUOCINOLONE ACETONIDE SCALP 0.01 % EX OIL      . FUROSEMIDE 20 MG PO TABS Oral Take 1 tablet (20 mg total) by mouth 2 (two) times daily. X 3 days, then 20mg  once daily (or as directed) 60 tablet 1  . GABAPENTIN 300 MG PO CAPS Oral Take 900 mg by mouth 2 (two) times daily.    Marland Kitchen LATANOPROST 0.005 % OP SOLN Both Eyes Place 1 drop into both eyes at bedtime.      Marland Kitchen LETROZOLE 2.5 MG PO TABS Oral Take 2.5 mg by mouth every evening.    Marland Kitchen LEVETIRACETAM ER 500 MG PO TB24 Oral Take 1,000 mg by mouth at bedtime.    Marland Kitchen LEVOTHYROXINE SODIUM 88 MCG PO TABS Oral Take 88 mcg by mouth daily.    Marland Kitchen DAILY MULTIVITAMINS/IRON PO Oral Take by mouth daily.    Marland Kitchen  NIFEDIPINE ER OSMOTIC 30  MG PO TB24 Oral Take 30 mg by mouth daily.    Marland Kitchen OMEPRAZOLE 20 MG PO CPDR Oral Take 20 mg by mouth 2 (two) times daily.    . OXYCODONE-ACETAMINOPHEN 5-325 MG PO TABS Oral Take 2 tablets by mouth as needed. Take 1 tablet per day as needed for pain    . PILOCARPINE HCL 1 % OP SOLN      . PREDNISONE (PAK) 10 MG PO TABS      . SOLIFENACIN SUCCINATE 5 MG PO TABS Oral Take 5 mg by mouth daily.    . SULFAMETHOXAZOLE-TMP DS 800-160 MG PO TABS      . SULFAMETHOXAZOLE-TRIMETHOPRIM 800-160 MG PO TABS Oral Take 1 tablet by mouth 2 (two) times daily. 14 tablet 0  . VALSARTAN 80 MG PO TABS Oral Take 80 mg by mouth daily.    . WARFARIN SODIUM 5 MG PO TABS Oral Take 5 mg by mouth as directed. Take 5 mg every day except Friday. On Friday take 2.5 mg (1/2 tablet) .      BP 116/57  Pulse 64  Temp 98.1 F (36.7 C) (Oral)  Resp 20  SpO2 95%  Physical Exam  Nursing note and vitals reviewed. Constitutional: She is oriented to person, place, and time. She appears well-developed and well-nourished.  HENT:  Head: Normocephalic and atraumatic.  Eyes: Conjunctivae and EOM are normal. Pupils are equal, round, and reactive to light.  Neck: Normal range of motion. Neck supple.  Cardiovascular: Normal rate.   Pulmonary/Chest: Effort normal.  Abdominal: Soft.  Musculoskeletal: Normal range of motion. She exhibits tenderness. She exhibits no edema.       RLE tenderness  Neurological: She is alert and oriented to person, place, and time. She has normal reflexes.  Skin: Skin is warm and dry. Ecchymosis, lesion and rash noted. Rash is macular. There is erythema.          5x3cm area of skin breakdown statge 2 with drainage to RLE medial L lateral  ?cellulitis to bilateral upper thighs.    Psychiatric: She has a normal mood and affect.    ED Course  Procedures (including critical care time) 3pm Wound care done.  Wet to dry dressing to RLE venous ulcer.  Will start vancomycin and admit for  venous ulcer. Daughter at bedside.  No acute distress.   Labs Reviewed  CBC WITH DIFFERENTIAL  URINALYSIS, ROUTINE W REFLEX MICROSCOPIC  COMPREHENSIVE METABOLIC PANEL  CULTURE, BLOOD (ROUTINE X 2)  CULTURE, BLOOD (ROUTINE X 2)  PROTIME-INR  WOUND CULTURE  CK  TROPONIN I   No results found.   No diagnosis found.    MDM  78 year old female in for chronic venostasis ulcer right lower extremity. Patient has been on doxycycline and Bactrim x2 with no improvement. For the past 24 hours he lateral thighs have become reddened and question cellulitis. Patient was sent here from the wound Center to be admitted. PCP is Dr. Ian Bushman. Chest x-ray pending for dry cough.Initial O 2 sat 88%.  O2 sats 96 presently. BUN 27 creatinine 2.31. INRs 3.31. UA is unremarkable. Vancomycin 1 gram given in the ER.  Afebrile WBC normal.    Labs Reviewed  CBC WITH DIFFERENTIAL - Abnormal; Notable for the following:    Eosinophils Relative 7 (*)     All other components within normal limits  COMPREHENSIVE METABOLIC PANEL - Abnormal; Notable for the following:    BUN 27 (*)     Creatinine, Ser 2.31 (*)  Albumin 3.0 (*)     Total Bilirubin 0.2 (*)     GFR calc non Af Amer 18 (*)     GFR calc Af Amer 21 (*)     All other components within normal limits  PROTIME-INR - Abnormal; Notable for the following:    Prothrombin Time 34.1 (*)     INR 3.31 (*)     All other components within normal limits  URINALYSIS, ROUTINE W REFLEX MICROSCOPIC  CK  CULTURE, BLOOD (ROUTINE X 2)  CULTURE, BLOOD (ROUTINE X 2)  WOUND CULTURE  TROPONIN I           Remi Haggard, NP 02/10/12 1634

## 2012-02-10 NOTE — Progress Notes (Signed)
ANTIBIOTIC/ANTICOAGULANT CONSULT NOTE - INITIAL  Pharmacy Consult for Vancomcyin Indication: Cellulitis  Pharmacy Consult for Coumadin Indication: Hx DVT  Allergies  Allergen Reactions  . Morphine Sulfate     REACTION: rash: ITCHING  . Penicillins     REACTION: urticaria (hives)  . Sertraline Hcl     REACTION: rash    Patient Measurements: Height: 5\' 4"  (162.6 cm) Weight: 161 lb (73.029 kg) IBW/kg (Calculated) : 54.7   Vital Signs: Temp: 98.2 F (36.8 C) (08/20 1825) Temp src: Oral (08/20 1825) BP: 111/63 mmHg (08/20 1825) Pulse Rate: 62  (08/20 1825) Intake/Output from previous day:   Intake/Output from this shift:    Labs:  Basename 02/10/12 1445  WBC 5.5  HGB 12.3  PLT 225  LABCREA --  CREATININE 2.31*   Estimated Creatinine Clearance: 17.1 ml/min (by C-G formula based on Cr of 2.31). 20 ml/mim/1.97m2 (normalized)  INR = 3.31  Microbiology: Recent Results (from the past 720 hour(s))  CULTURE, BLOOD (ROUTINE X 2)     Status: Normal   Collection Time   01/28/12  4:40 PM      Component Value Range Status Comment   Specimen Description BLOOD ARM RIGHT   Final    Special Requests     Final    Value: BOTTLES DRAWN AEROBIC AND ANAEROBIC BLUE 10CC RED 7CC   Culture  Setup Time 01/29/2012 02:34   Final    Culture NO GROWTH 5 DAYS   Final    Report Status 02/04/2012 FINAL   Final   CULTURE, BLOOD (ROUTINE X 2)     Status: Normal   Collection Time   01/28/12  4:50 PM      Component Value Range Status Comment   Specimen Description BLOOD HAND RIGHT   Final    Special Requests BOTTLES DRAWN AEROBIC ONLY 5CC   Final    Culture  Setup Time 01/29/2012 02:34   Final    Culture NO GROWTH 5 DAYS   Final    Report Status 02/04/2012 FINAL   Final     Medical History: Past Medical History  Diagnosis Date  . Hypertension   . Hypothyroidism   . Seizure disorder   . History of cerebrovascular accident   . Severe stage glaucoma     legally blind  . Depression with  anxiety   . OAB (overactive bladder)   . ADENOCARCINOMA, LEFT BREAST 04/11/2010    s/p L mastectomy, on femara x 72yr  . BACK PAIN, LUMBAR, CHRONIC   . DVT 05/2007    RLE following R THR - chronic coumadin, chronic RLE pain  . GLAUCOMA   . OSTEOARTHRITIS, HANDS, BILATERAL   . OSTEOPENIA   . Retinal ischemia   . Sciatica of right side     chronic RLE pain, multifactorial - MRI T/L spine 02/2011  . VITAMIN D DEFICIENCY dx 08/2008  . Venous ulcer of right lower extremity with varicose veins   . Dementia     Medications:  Scheduled:    . vancomycin  1,000 mg Intravenous Once   Assessment:  78 yo sent to ED from wound clinic for worsening right LEE ulcer draining/oozing. Has been treated with bactrim since 8/13.   On chronic coumadin for hx DVT, INR is supratherapeutic, concurrent bactrim likely contributing   Goal of Therapy:   Vancomycin trough level 10-15 mcg/ml  INR 2-3  Plan:   Received 1gm vancomycin at 17:15 in ER, continue with 500mg  IV q24h starting tomorrow  Check  trough at steady state Follow up renal function & cultures Hold coumadin today Daily PT/INR  Loralee Pacas, PharmD, BCPS Pager: [phone] 02/10/2012,7:33 PM

## 2012-02-10 NOTE — Telephone Encounter (Signed)
Have tried to call several times and never got through.

## 2012-02-10 NOTE — Progress Notes (Signed)
[facility] ED CM noted CM consult from Admission RN Cm spoke with husband, patient and their daughter in ED Rm # 5 about CM consult for possible need for home health CM answered questions from husband and daughter about pcp being gatekeeper to pt other specialists, various return appointments for multiple health needs.  Reviewed differences in home health, private duty and personal care services related roles and coverage.  Provided cm business card, copy of available [place] home health agencies to assist with making their choice if needed for possible services prior to d/c.  Discussed future access to mychart and referred to [employer] when daughter states their is no computer

## 2012-02-10 NOTE — ED Provider Notes (Signed)
I saw and evaluated the patient, reviewed the resident's note and I agree with the findings and plan. Has chronic venous stasis ulcer.  Gets tx at wound center.   Not getting better.  Now pain increased and has blisters in thighs. No fever, chills.  + sweats.   Will check labs, start iv abxs and consult medicine for admission.   Cheri Guppy, MD 02/10/12 1451

## 2012-02-10 NOTE — ED Notes (Signed)
Pt has been treated for venous ulcer in right leg- undergoing antibiotic treatment without relief. Taken to wound center today- sent here for admission.

## 2012-02-10 NOTE — H&P (Signed)
[facility] History and Physical  Jennifer Brennan YNW:[id number] DOB: 02/05/1924 DOA: 02/10/2012  Referring physician: Dr. Weldon Inches PCP: [name] Paci, MD   Chief Complaint: increase bilateral lower extremity swelling, shortness of breath, worsening cellulitis and body close vein wound.  HPI:  78 year old female with a past medical history significant for hypertension, hypothyroidism, seizure disorder, CVA or glaucoma (legally blind), chronic venous dermatitis/ulcer, chronic kidney disease and DVT (on chronic Coumadin); came to the hospital complaining of worsening right lower extremity Ulcer and diffuse cellulitis. Patient reports that she has been experiencing problems of recurrent cellulitis, increase swelling of her lower extremities and also increased pain on her legs. Per patient's family member she was recently started on doxycycline and Bactrim for the last 2 weeks and also on Lasix in order to try to help with the swelling and the worsening wound/cellulitis, despite this treatment patient's symptoms continue worsening and she has been referred to the emergency department for further evaluation and treatment.  Patient also reports for the last 2-3 days increase shortness of breath, orthopnea and dry cough. Patient endorses constipation.  She denies any chest pain, fever, chills, nausea/vomiting, abdominal pain or any other acute complaints.  In the ED evaluation demonstrated worsening right lower extremity weeping lesions/cellulitis with the ulcer that is draining/oozing serosanguineous material; patient was also found with a creatinine elevated at 2.3 which is new for her and bilateral lower extremity edema 2-3+++; [facility] hospital[facility] has been called to admit the patient for further evaluation and treatment.  Review of Systems:  Negative except as mentioned on history of present illness.  Past Medical History  Diagnosis Date  . Hypertension   . Hypothyroidism   . Seizure  disorder   . History of cerebrovascular accident   . Severe stage glaucoma     legally blind  . Depression with anxiety   . OAB (overactive bladder)   . ADENOCARCINOMA, LEFT BREAST 04/11/2010    s/p L mastectomy, on femara x 57yr  . BACK PAIN, LUMBAR, CHRONIC   . DVT 05/2007    RLE following R THR - chronic coumadin, chronic RLE pain  . GLAUCOMA   . OSTEOARTHRITIS, HANDS, BILATERAL   . OSTEOPENIA   . Retinal ischemia   . Sciatica of right side     chronic RLE pain, multifactorial - MRI T/L spine 02/2011  . VITAMIN D DEFICIENCY dx 08/2008  . Venous ulcer of right lower extremity with varicose veins   . Dementia    Past Surgical History  Procedure Date  . Total abdominal hysterectomy   . Total hip arthroplasty 10/2006    right hip  . Refractive surgery     B/L  . Breast surgery 03/2010    breast biopsy, L mastectomy   . Tonsillectomy    Social History:  reports that she quit smoking about 32 years ago. Her smoking use included Cigarettes. She quit after 45 years of use. She has never used smokeless tobacco. She reports that she does not drink alcohol or use illicit drugs. patient came from home leave with her husband and daughter nearby. Require assistance with activities of daily living secondary to her blindness and lately do to increase lower extremities swelling and weakness her level of assistance hass been more demanding.   Allergies  Allergen Reactions  . Morphine Sulfate     REACTION: rash: ITCHING  . Penicillins     REACTION: urticaria (hives)  . Sertraline Hcl     REACTION: rash    Family  History  Problem Relation Age of Onset  . Ovarian cancer Mother   . Cancer Mother   . Deep vein thrombosis Mother   . Heart disease Mother   . Arthritis Other     grandparents  . Lung cancer Other   . Heart disease Other     parent  . Cancer Father     BRAIN TUMOR  . Cancer Brother   . Hyperlipidemia Brother   . Hypertension Brother   . Stroke Brother   . Arthritis  Brother   . Diabetes Daughter   . Heart disease Daughter   . Hypertension Daughter   . Hyperlipidemia Daughter   . Other Daughter     VARICOSE VEINS    Prior to Admission medications   Medication Sig Start Date End Date Taking? Authorizing Provider  albuterol (PROVENTIL HFA;VENTOLIN HFA) 108 (90 BASE) MCG/ACT inhaler Inhale 2 puffs into the lungs every 6 (six) hours as needed. For shortness of breath. 01/14/12 01/13/13 Yes Newt Lukes, MD  brimonidine (ALPHAGAN) 0.15 % ophthalmic solution Place 1 drop into both eyes every 12 (twelve) hours.    Yes Historical Provider, MD  cholecalciferol (VITAMIN D) 1000 UNITS tablet Take 1,000 Units by mouth daily.   Yes Historical Provider, MD  diphenhydrAMINE (BENADRYL) 25 MG tablet Take 25 mg by mouth as needed. Take with oxycodone to reduce itching   Yes Historical Provider, MD  docusate sodium (COLACE) 100 MG capsule Take 100 mg by mouth 2 (two) times daily.    Yes Historical Provider, MD  dorzolamide-timolol (COSOPT) 22.3-6.8 MG/ML ophthalmic solution Place 1 drop into both eyes 2 (two) times daily.     Yes Historical Provider, MD  fentaNYL (DURAGESIC - DOSED MCG/HR) 12 MCG/HR Place 1 patch onto the skin every 3 (three) days. 01/12/12  Yes Newt Lukes, MD  furosemide (LASIX) 20 MG tablet Take 20 mg by mouth daily.   Yes Historical Provider, MD  gabapentin (NEURONTIN) 300 MG capsule Take 900 mg by mouth 2 (two) times daily.   Yes Historical Provider, MD  latanoprost (XALATAN) 0.005 % ophthalmic solution Place 1 drop into both eyes at bedtime.     Yes Historical Provider, MD  letrozole (FEMARA) 2.5 MG tablet Take 2.5 mg by mouth every evening. 10/07/11  Yes Lowella Dell, MD  levETIRAcetam (KEPPRA XR) 500 MG 24 hr tablet Take 1,000 mg by mouth at bedtime.   Yes Historical Provider, MD  levothyroxine (SYNTHROID, LEVOTHROID) 88 MCG tablet Take 88 mcg by mouth daily. 10/08/11  Yes Newt Lukes, MD  Multiple Vitamin (MULTIVITAMIN WITH  MINERALS) TABS Take 1 tablet by mouth daily.   Yes Historical Provider, MD  NIFEdipine (PROCARDIA-XL/ADALAT-CC/NIFEDICAL-XL) 30 MG 24 hr tablet Take 30 mg by mouth daily.   Yes Historical Provider, MD  omeprazole (PRILOSEC) 20 MG capsule Take 20 mg by mouth 2 (two) times daily.   Yes Historical Provider, MD  oxyCODONE-acetaminophen (PERCOCET/ROXICET) 5-325 MG per tablet Take 1 tablet by mouth every 8 (eight) hours as needed. For pain. 01/30/12  Yes Clydie Braun Prueter, PA-C  pilocarpine (PILOCAR) 1 % ophthalmic solution Place 1 drop into both eyes 3 (three) times daily.  08/17/11  Yes Historical Provider, MD  solifenacin (VESICARE) 5 MG tablet Take 5 mg by mouth daily.   Yes Historical Provider, MD  sulfamethoxazole-trimethoprim (SEPTRA DS) 800-160 MG per tablet Take 1 tablet by mouth 2 (two) times daily. 02/03/12 02/13/12 Yes Newt Lukes, MD  valsartan (DIOVAN) 80 MG tablet Take 80  mg by mouth daily.   Yes Historical Provider, MD  warfarin (COUMADIN) 5 MG tablet Take 2.5-5 mg by mouth daily. 1 tab daily except for 0.5 tabs on Wednesdays and Fridays.   Yes Historical Provider, MD   Physical Exam: Filed Vitals:   02/10/12 1359 02/10/12 1414 02/10/12 1632  BP: 116/57  125/47  Pulse: 66 64 58  Temp: 98.8 F (37.1 C) 98.1 F (36.7 C) 97.8 F (36.6 C)  TempSrc: Oral Oral Oral  Resp: 20  20  SpO2: 88% 95% 95%     General:  No acute distress, patient legally blind, mild dry mucous membranes, cooperative to examination and able to follow commands appropriately. Patient was able to speak in full sentences and did not have any workup present.  Eyes: no icterus, no nystagmus, patient medically blind secondary to glaucoma. Extraocular muscles appears to be intact.  ENT: dry mucous membranes, no erythema or exudate inside her throat, ferritin condition; ears or nostrils without drainage.  Neck: no JVD, no bruits; no thyromegaly.  Cardiovascular: no murmurs, no rubs or gallops appreciated on exam; S1  and S2.  Respiratory: bibasilar crackles and diffuse wheezing  Abdomen: soft, no distention, positive bowel sounds, no tenderness.  Skin: diffuse erythematosus rash spreading from her bilateral LE all the way to the lower aspect of her abdomen and lower back; weeping lesions appreciated on the inner side of her right lower extremity, also black discoloration from venous disease dermatitis seen on her right lower extremity.  Extremities: Bilateral edema up to her knees 2-3 pulses, right lower extremity with weeping lesions, venous stasis dermatitis and open ulcer. Also with diffuse rash up to her tights bilaterally, warm to touch and tender to palpation.  Musculoskeletal: no joint swelling, no joint pain.  Psychiatric: stable, no suicidal ideation or hallucinations.  Neurologic: alert, awake and oriented x3, no focal motor or sensory deficit appreciated; muscle strength 3-4/5 secondary to poor effort, cranial nerve except for optic nerve grossly intact.  Labs on Admission:  Basic Metabolic Panel:  Lab 02/10/12 4540  NA 137  K 4.7  CL 102  CO2 26  GLUCOSE 86  BUN 27*  CREATININE 2.31*  CALCIUM 9.2  MG --  PHOS --   Liver Function Tests:  Lab 02/10/12 1445  AST 16  ALT 12  ALKPHOS 52  BILITOT 0.2*  PROT 6.4  ALBUMIN 3.0*   CBC:  Lab 02/10/12 1445  WBC 5.5  NEUTROABS 3.0  HGB 12.3  HCT 36.4  MCV 80.9  PLT 225   Cardiac Enzymes:  Lab 02/10/12 1800 02/10/12 1445  CKTOTAL -- 74  CKMB -- --  CKMBINDEX -- --  TROPONINI <0.30 --    Radiological Exams on Admission: Dg Chest Port 1 View  02/10/2012  *RADIOLOGY REPORT*  Clinical Data: Low oxygen saturation  PORTABLE CHEST - 1 VIEW  Comparison: Prior chest x-ray 01/28/2012, prior chest CT 01/22/2011  Findings: Single portable view of the chest.  No pulmonary edema, or focal airspace consolidation.  Linear opacity in the left retrocardiac region likely reflects a small amount of atelectasis. No pleural effusion, or  pneumothorax.  Cardiac and mediastinal contours within normal limits.  Atherosclerotic calcification of the thoracic aorta noted.  No acute osseous findings.  IMPRESSION: No acute cardiopulmonary disease.   Original Report Authenticated By: Vilma Prader     Assessment/Plan 1-Cellulitis: initially limited to right lower sextremity around the nose also 1 but now diffuse and reaching her tights and left lower extremity. Will admit  the patient and a start her on vancomycin dose adjusted per pharmacy secondary to her renal failure. Will care has been consulted. Will check for MRSA screening and will follow wound culture taken prior to IV antibiotics initiation.  2-acute on chronic renal failure (at baseline is stage II creatinine 1.2-1.3): Most likely secondary to outpatient use of Bactrim, decrease in renal perfusion due to intravascular depletion with the use of Lasix that has been recently started concomitant use of ARB's. Will hold nephrotoxic agents, check renal ultrasound, provide gentle hydration, check urinalysis. Basic metabolic to be repeated in the morning in order to follow creatinine trend.  3-HYPOTHYROIDISM: Will check TSH and continue Synthroid.  4-Shortness of breath and bilateral lower extremity edema: high concerns for CHF. Will check BNP, 2-D echo, close intake and output and also daily weights; will also admit the patient to telemetry and cycle her cardiac enzymes. Other consideration for shortness of breath is mild exacerbation of underlying COPD (patient with Rythmol heavy history of smoking) will use when necessary albuterol.  5-GLAUCOMA: Continue eyedrops. Patient legally blind.  6-HYPERTENSION:continue home regimen except for ARBs and Lasix due to acute renal failure. Gentle hydration and follow vital signs.  7-DVT:continue Coumadin per pharmacy. Will hold Coumadin today since INR is supratherapeutic.  8-SEIZURE DISORDER: continue Keppra. No seizure activity.  9-GERD  (gastroesophageal reflux disease):continue PPI.  10-Venous ulcer of right lower extremity with varicose veins:chronic. Will ask for wound care evaluation and recommendations. Patient has been started on vancomycin for concomitant spread cellulitis.  11-Weakness generalized: will ask physical therapy and OT to evaluate and and provide recommendations for discharge plans based on patient capabilities to perform activities of daily living.   Code Status: Full Family Communication: Husband and daughter at bedside Disposition Plan: Home if stable and appropriate at discharge; PT consulted to help determine best and safer discharge plans.   Time spent: >30 minutes  Goldie Tregoning [facility] Pager [phone]  If 7PM-7AM, please contact night-coverage [email or web address] Password [facility] 02/10/2012, 6:58 PM

## 2012-02-11 DIAGNOSIS — C50919 Malignant neoplasm of unspecified site of unspecified female breast: Secondary | ICD-10-CM

## 2012-02-11 DIAGNOSIS — I517 Cardiomegaly: Secondary | ICD-10-CM

## 2012-02-11 LAB — CBC
MCHC: 33.3 g/dL (ref 30.0–36.0)
MCV: 80.3 fL (ref 78.0–100.0)
Platelets: 207 10*3/uL (ref 150–400)
RDW: 15.2 % (ref 11.5–15.5)
WBC: 4.5 10*3/uL (ref 4.0–10.5)

## 2012-02-11 LAB — BASIC METABOLIC PANEL
Calcium: 8.7 mg/dL (ref 8.4–10.5)
Chloride: 105 mEq/L (ref 96–112)
Creatinine, Ser: 1.96 mg/dL — ABNORMAL HIGH (ref 0.50–1.10)
GFR calc Af Amer: 25 mL/min — ABNORMAL LOW (ref >90)
GFR calc non Af Amer: 22 mL/min — ABNORMAL LOW (ref >90)

## 2012-02-11 LAB — TSH: TSH: 4.744 u[IU]/mL — ABNORMAL HIGH (ref 0.350–4.500)

## 2012-02-11 LAB — MRSA PCR SCREENING: MRSA by PCR: NEGATIVE

## 2012-02-11 MED ORDER — WARFARIN - PHARMACIST DOSING INPATIENT: Freq: Every day | Status: DC

## 2012-02-11 NOTE — ED Provider Notes (Signed)
Medical screening examination/treatment/procedure(s) were conducted as a shared visit with non-physician practitioner(s) and myself.  I personally evaluated the patient during the encounter  Cheri Guppy, MD 02/11/12 1728

## 2012-02-11 NOTE — Evaluation (Signed)
Occupational Therapy Evaluation Patient Details Name: Jennifer Brennan MRN: [id number] DOB: Mar 24, 1924 Today's Date: 02/11/2012 Time: 7829-5621 OT Time Calculation (min): 32 min  OT Assessment / Plan / Recommendation Clinical Impression  78 yo female admitted with cellulitis R LE. Noted small open area on medial surface of lower leg. Family would like pt to return home at d/c. Skilled OT recommended to maximize independence with BADLs to min A/supervision level in prep for safe d/c home with HHOT and HH aide.    OT Assessment  Patient needs continued OT Services    Follow Up Recommendations  Home health OT;Supervision/Assistance - 24 hour;Other (comment) (HH aide)    Barriers to Discharge      Equipment Recommendations  None recommended by OT    Recommendations for Other Services    Frequency  Min 2X/week    Precautions / Restrictions Precautions Precautions: Fall Restrictions Weight Bearing Restrictions: No   Pertinent Vitals/Pain Pt reported increased pain throughout RLE with ambulation. RN made aware and repositioned for comfort.    ADL  Grooming: Performed;Wash/dry face;Set up Where Assessed - Grooming: Unsupported sitting Toilet Transfer: Performed;Minimal assistance Toilet Transfer Method: Stand pivot Toilet Transfer Equipment: Bedside commode Toileting - Clothing Manipulation and Hygiene: Performed;+1 Total assistance Where Assessed - Toileting Clothing Manipulation and Hygiene: Sit to stand from 3-in-1 or toilet Equipment Used: Rolling walker;Gait belt Transfers/Ambulation Related to ADLs: Pt ambulated ~10 feet. Fatigues very quickly. Receives extensive assistance with bathing/dressing at home. Husband donned pts socks while she was in bed.    OT Diagnosis: Generalized weakness  OT Problem List: Decreased activity tolerance;Decreased safety awareness;Decreased cognition;Pain;Decreased knowledge of use of DME or AE OT Treatment Interventions: Self-care/ADL  training;Therapeutic activities;DME and/or AE instruction;Patient/family education   OT Goals Acute Rehab OT Goals OT Goal Formulation: With patient/family Time For Goal Achievement: 02/25/12 Potential to Achieve Goals: Good ADL Goals Pt Will Perform Grooming: Standing at sink;Other (comment) (with minguard A) ADL Goal: Grooming - Progress: Goal set today Pt Will Transfer to Toilet: with supervision;3-in-1;Stand pivot transfer;Ambulation;Comfort height toilet ADL Goal: Toilet Transfer - Progress: Goal set today Pt Will Perform Toileting - Clothing Manipulation: with min assist;Standing ADL Goal: Toileting - Clothing Manipulation - Progress: Goal set today Pt Will Perform Toileting - Hygiene: with min assist;Sit to stand from 3-in-1/toilet ADL Goal: Toileting - Hygiene - Progress: Goal set today  Visit Information  Last OT Received On: 02/11/12 Assistance Needed: +1 PT/OT Co-Evaluation/Treatment: Yes    Subjective Data  Subjective: This leg is starting to hurt bad now.. Patient Stated Goal: Per family- to get some extra help at home.   Prior Functioning  Vision/Perception  Home Living Lives With: Spouse Available Help at Discharge: Family;Available 24 hours/day Type of Home: House Home Access: Stairs to enter [employer] of Steps: 5 Entrance Stairs-Rails: Left Home Layout: Two level;Able to live on main level with bedroom/bathroom Bathroom Shower/Tub: Health visitor: Handicapped height Home Adaptive Equipment: Shower chair without back;Grab bars in shower;Walker - rolling;Straight cane;Quad cane;Bedside commode/3-in-1;Wheelchair - manual Prior Function Level of Independence: Needs assistance Needs Assistance: Bathing;Dressing;Meal Prep;Light Housekeeping Bath: Moderate Dressing: Moderate Meal Prep: Total Light Housekeeping: Total Driving: No Vocation: Retired Musician: No difficulties Dominant Hand: Right        Cognition  Overall Cognitive Status: History of cognitive impairments - at baseline Arousal/Alertness: Awake/alert Orientation Level: Appears intact for tasks assessed Behavior During Session: [facility] for tasks performed    Extremity/Trunk Assessment Right Upper Extremity Assessment RUE ROM/Strength/Tone: Deficits RUE ROM/Strength/Tone  Deficits: generalized weakness Left Upper Extremity Assessment LUE ROM/Strength/Tone: Deficits LUE ROM/Strength/Tone Deficits: generalized weakness Right Lower Extremity Assessment RLE ROM/Strength/Tone: Unable to fully assess;Due to pain;Deficits RLE ROM/Strength/Tone Deficits: Pt able to weightbear, but painful Left Lower Extremity Assessment LLE ROM/Strength/Tone: Deficits LLE ROM/Strength/Tone Deficits: Strength at least 3+/5 throughout   Mobility Bed Mobility Bed Mobility: Supine to Sit Supine to Sit: 4: Min assist;HOB elevated;With rails Details for Bed Mobility Assistance: Increased time. Multimodal cues for safety, technique, hand placement.  Transfers Sit to Stand: 4: Min assist;With upper extremity assist;From bed Stand to Sit: 4: Min assist;With upper extremity assist;To chair/3-in-1 Details for Transfer Assistance: Multimodal cues for safety, technique, hand placement. Assist to rise, stabilize, control descent.   Exercise    Balance    End of Session OT - End of Session Equipment Utilized During Treatment: Gait belt Activity Tolerance: Patient limited by fatigue Patient left: in chair;with call bell/phone within reach;with family/visitor present  GO     Tarrance Januszewski A OTR/L [phone] 02/11/2012, 11:25 AM

## 2012-02-11 NOTE — Progress Notes (Signed)
  Echocardiogram 2D Echocardiogram has been performed.  Cathie Beams 02/11/2012, 11:51 AM

## 2012-02-11 NOTE — Evaluation (Signed)
Physical Therapy Evaluation Patient Details Name: Jennifer Brennan MRN: [id number] DOB: September 27, 1923 Today's Date: 02/11/2012 Time: 1308-6578 PT Time Calculation (min): 34 min  PT Assessment / Plan / Recommendation Clinical Impression  78 yo female admitted with cellulitis R LE. Noted small open area on medial surface of lower leg. Pt requires assist for ADLs/mobility at baseline-assist is provided by family. Family states they wish for pt to return home with some additional assistance if possible. Recommend HHPT and home health aide.     PT Assessment  Patient needs continued PT services    Follow Up Recommendations  Home health PT;Supervision/Assistance - 24 hour (Home Health Aide)    Barriers to Discharge        Equipment Recommendations  None recommended by PT    Recommendations for Other Services OT consult   Frequency Min 3X/week    Precautions / Restrictions Precautions Precautions: Fall Restrictions Weight Bearing Restrictions: No   Pertinent Vitals/Pain      Mobility  Bed Mobility Bed Mobility: Supine to Sit Supine to Sit: 4: Min assist;HOB elevated;With rails Details for Bed Mobility Assistance: Increased time. Multimodal cues for safety, technique, hand placement.  Transfers Transfers: Sit to Stand;Stand to Sit Sit to Stand: 4: Min assist;With upper extremity assist;From bed Stand to Sit: 4: Min assist;With upper extremity assist;To chair/3-in-1 Details for Transfer Assistance: Multimodal cues for safety, technique, hand placement. Assist to rise, stabilize, control descent. Ambulation/Gait Ambulation/Gait Assistance: 4: Min assist Ambulation Distance (Feet): 10 Feet Assistive device: Rolling walker Ambulation/Gait Assistance Details: Multimodal cues for safety, direction, distance from RW. Distance limited by pain, fatigue. Brought recliner up behind pt to sit and return to room.  Gait Pattern: Step-through pattern;Antalgic;Decreased stride length;Decreased  step length - right;Decreased step length - left    Exercises     PT Diagnosis: Difficulty walking;Generalized weakness;Acute pain  PT Problem List: Decreased strength;Decreased activity tolerance;Decreased mobility;Pain;Decreased knowledge of use of DME;Decreased skin integrity PT Treatment Interventions: DME instruction;Gait training;Functional mobility training;Therapeutic activities;Therapeutic exercise;Patient/family education   PT Goals Acute Rehab PT Goals PT Goal Formulation: With patient/family Time For Goal Achievement: 02/18/12 Potential to Achieve Goals: Fair Pt will go Supine/Side to Sit: with supervision PT Goal: Supine/Side to Sit - Progress: Goal set today Pt will go Sit to Supine/Side: with supervision PT Goal: Sit to Supine/Side - Progress: Goal set today Pt will go Sit to Stand: with supervision PT Goal: Sit to Stand - Progress: Goal set today Pt will Ambulate: 16 - 50 feet;with min assist;with rolling walker PT Goal: Ambulate - Progress: Goal set today  Visit Information  Last PT Received On: 02/11/12 Assistance Needed: +1    Subjective Data  Subjective: "This leg is hurting now" Patient Stated Goal: None   Prior Functioning  Home Living Lives With: Spouse Available Help at Discharge: Family;Available 24 hours/day Type of Home: House Home Access: Stairs to enter [employer] of Steps: 5 Entrance Stairs-Rails: Left Home Layout: Two level;Able to live on main level with bedroom/bathroom Bathroom Shower/Tub: Health visitor: Handicapped height Home Adaptive Equipment: Shower chair without back;Grab bars in shower;Walker - rolling;Straight cane;Quad cane;Bedside commode/3-in-1 Prior Function Level of Independence: Needs assistance Needs Assistance: Bathing;Dressing;Meal Prep;Light Housekeeping Bath: Moderate Dressing: Moderate Meal Prep: Total Light Housekeeping: Total Driving: No Vocation:  Retired Musician: No difficulties Dominant Hand: Right    Cognition  Overall Cognitive Status: History of cognitive impairments - at baseline Arousal/Alertness: Awake/alert Orientation Level: Appears intact for tasks assessed Behavior During Session: [facility] for tasks  performed    Extremity/Trunk Assessment Right Lower Extremity Assessment RLE ROM/Strength/Tone: Unable to fully assess;Due to pain;Deficits RLE ROM/Strength/Tone Deficits: Pt able to weightbear, but painful Left Lower Extremity Assessment LLE ROM/Strength/Tone: Deficits LLE ROM/Strength/Tone Deficits: Strength at least 3+/5 throughout   Balance    End of Session PT - End of Session Equipment Utilized During Treatment: Gait belt Activity Tolerance: Patient limited by pain;Patient limited by fatigue Patient left: in chair;with call bell/phone within reach;with family/visitor present  GP     [name] [facility] 02/11/2012, 11:04 AM [phone]

## 2012-02-11 NOTE — Consult Note (Signed)
WOC consult Note Reason for Consult:RLE (medial aspect) venous ulceration Wound type:venous insufficiency Pressure Ulcer POA: No Measurement:6.5cm x 8cm x .2cm (area of involvement) embedded within a LLE that has pronounced hemosiderin staining Wound UJW:JXBJ has nearly healed/sealed over since admission and subsequent leg elevation.  Evidence of edema resolution is apparent in the "wrinkled" appearance of the skin Drainage (amount, consistency, odor) there is no drainage at this time Periwound:hemosiderin staining as mentioned above.  "Wrinkling" of skin indicating resolution of edema.   Dressing procedure/placement/frequency:I will continue with the elevation of her LE's while here, both in chair and when OOB.   A conservative dressing is placed for moist wound healing, but it is not compression.  I suggestion consideration of a HHRN for compression wraps (such as the Profore, 4-layer compression bandaging system) once weekly upon discharge until skin is completely reepithelialized. If you agree, please order. I will not follow.  Please re-consult if needed. Thanks, Ladona Mow, MSN, RN, [facility], CWOCN [phone])

## 2012-02-11 NOTE — Progress Notes (Addendum)
[facility] PROGRESS NOTE  Jennifer Brennan ZOX:[id number] DOB: 1924-01-01 DOA: 02/10/2012 PCP: [name] Paci, MD  Brief narrative: Patient admitted for worsening right lower extremity weeping lesions/cellulitis with the ulcer  draining/oozing serosanguineous fluid.  Assessment/Plan:  Principal Problem: *Cellulitis, right lower extremity - follow up wound culture results although  the specimen not collected in sterile environment - will continue vancomycin for now - cellulitis in lower extremity, right, seems to be improving - appreciate wound care consult  Active Problems: Acute on chronic kidney disease stage 2 - perhaps due to Bactrim patient was taking as an outpatient regimen for cellulitis - creatinine is slowly improving - renal ultrasound within normal limits  Hypothyroidism - continue synthroid  Lower extremity edema, shortness of breath - likely due to mild diastolic CHF - 2 D ECHO done on this admission showed normal EF and BNP only sightly elevated at around 500's  GLAUCOMA:  - Continue eyedrops.  - Patient legally blind.   HYPERTENSION: - continue home regimen except for ARBs and Lasix due to acute kidney injury  DVT - continue Coumadin per pharmacy.    SEIZURE DISORDER:  - continue Keppra  GERD - continue PPI's  Code Status: full code Family Communication: at bedside Disposition Plan: home with HHPT and HHRN  Manson Passey, MD  [facility] Pager [phone]  If 7PM-7AM, please contact night-coverage [email or web address] Password [email or web address] 02/11/2012, 5:52 PM   LOS: 1 day   Consultants:  Wound care  Procedures:  none  Antibiotics:  vanco  HPI/Subjective: No acute events overnight.   Objective: Filed Vitals:   02/10/12 1825 02/10/12 2039 02/11/12 0439 02/11/12 1459  BP: 111/63 135/71 106/66 133/70  Pulse: 62 65 64 68  Temp: 98.2 F (36.8 C) 97.7 F (36.5 C) 97.5 F (36.4 C) 97.8 F (36.6 C)  TempSrc: Oral Oral  Oral Oral  Resp: 18 20 18 20   Height: 5\' 4"  (1.626 m)     Weight: 73.029 kg (161 lb)  80.2 kg (176 lb 12.9 oz)   SpO2: 100% 91%  92%    Intake/Output Summary (Last 24 hours) at 02/11/12 1752 Last data filed at 02/11/12 1500  Gross per 24 hour  Intake    360 ml  Output    600 ml  Net   -240 ml    Exam:   General:  Pt is alert, follows commands appropriately, not in acute distress  Cardiovascular: Regular rate and rhythm, S1/S2, no murmurs, no rubs, no gallops  Respiratory: Clear to auscultation bilaterally, no wheezing, no crackles, no rhonchi  Abdomen: Soft, non tender, non distended, bowel sounds present, no guarding  Extremities: right extremity lower anterior shin i=ulcer draining pus with surrounding erythema  Neuro: Grossly nonfocal  Data Reviewed: Basic Metabolic Panel:  Lab 02/11/12 8295 02/10/12 2215 02/10/12 1445  NA 138 -- 137  K 4.9 -- 4.7  CL 105 -- 102  CO2 27 -- 26  GLUCOSE 97 -- 86  BUN 24* -- 27*  CREATININE 1.96* -- 2.31*  CALCIUM 8.7 -- 9.2  MG -- 2.3 --  PHOS -- 3.4 --   Liver Function Tests:  Lab 02/10/12 1445  AST 16  ALT 12  ALKPHOS 52  BILITOT 0.2*  PROT 6.4  ALBUMIN 3.0*   CBC:  Lab 02/11/12 0519 02/10/12 1445  WBC 4.5 5.5  NEUTROABS -- 3.0  HGB 10.7* 12.3  HCT 32.1* 36.4  MCV 80.3 80.9  PLT 207 225   Cardiac Enzymes:  Lab 02/10/12 2215 02/10/12  1800 02/10/12 1445  CKTOTAL -- -- 74  CKMB -- -- --  CKMBINDEX -- -- --  TROPONINI <0.30 <0.30 --    Recent Results (from the past 240 hour(s))  WOUND CULTURE     Status: Normal (Preliminary result)   Collection Time   02/10/12  3:03 PM      Component Value Range Status Comment   Specimen Description LEG   Final    Special Requests Normal   Final    Gram Stain     Final    Value: NO WBC SEEN     FEW SQUAMOUS EPITHELIAL CELLS PRESENT     FEW GRAM POSITIVE COCCI IN PAIRS     FEW GRAM NEGATIVE RODS   Culture Culture reincubated for better growth   Final    Report Status  PENDING   Incomplete   MRSA PCR SCREENING     Status: Normal   Collection Time   02/10/12 10:27 PM      Component Value Range Status Comment   MRSA by PCR NEGATIVE  NEGATIVE Final      Studies: US Renal 02/10/2012  *IMPRESSION: Right kidney appears normal.  Left kidney is not well seen but does not appear to demonstrate hydronephrosis.     Dg Chest Port 1 View 02/10/2012  * IMPRESSION: No acute cardiopulmonary disease.      Scheduled Meds:   . brimonidine  1 drop Both Eyes Q12H  . cholecalciferol  1,000 Units Oral Daily  . darifenacin  7.5 mg Oral Daily  . docusate sodium  100 mg Oral BID  . dorzolamide-timolol  1 drop Both Eyes BID  . gabapentin  900 mg Oral BID  . latanoprost  1 drop Both Eyes QHS  . letrozole  2.5 mg Oral QPM  . levETIRAcetam  1,000 mg Oral QHS  . levothyroxine  88 mcg Oral QAC breakfast  . NIFEdipine  30 mg Oral Daily  . pantoprazole  40 mg Oral BID AC  . pilocarpine  1 drop Both Eyes TID  . polyethylene glycol  17 g Oral Daily  . sodium chloride  3 mL Intravenous Q12H  . vancomycin  500 mg Intravenous Q24H  . vancomycin  1,000 mg Intravenous Once  . Warfarin - Pharmacist Dosing Inpatient   Does not apply [phone]

## 2012-02-11 NOTE — Progress Notes (Signed)
ANTICOAGULATION CONSULT NOTE - Follow Up Consult  Pharmacy Consult for Warfarin Indication: hx of DVT  Allergies  Allergen Reactions  . Morphine Sulfate     REACTION: rash: ITCHING  . Penicillins     REACTION: urticaria (hives)  . Sertraline Hcl     REACTION: rash    Patient Measurements: Height: 5\' 4"  (162.6 cm) Weight: 176 lb 12.9 oz (80.2 kg) (bed weight) IBW/kg (Calculated) : 54.7    Vital Signs: Temp: 97.5 F (36.4 C) (08/21 0439) Temp src: Oral (08/21 0439) BP: 106/66 mmHg (08/21 0439) Pulse Rate: 64  (08/21 0439)  Labs:  Basename 02/11/12 0519 02/10/12 2215 02/10/12 1800 02/10/12 1445  HGB 10.7* -- -- 12.3  HCT 32.1* -- -- 36.4  PLT 207 -- -- 225  APTT -- -- -- --  LABPROT 38.3* -- -- 34.1*  INR 3.84* -- -- 3.31*  HEPARINUNFRC -- -- -- --  CREATININE 1.96* -- -- 2.31*  CKTOTAL -- -- -- 74  CKMB -- -- -- --  TROPONINI -- <0.30 <0.30 --    Estimated Creatinine Clearance: 21.1 ml/min (by C-G formula based on Cr of 1.96).    Assessment:  88 YOF on chronic warfarin for hx of DVT.    Home dose typically: 5 mg daily except takes 2.5 mg on Fridays per [place] anticoagulation clinic notes.  During 8/16 clinic visit, INR was supratherapeutic 2/2 drug interaction with Septra (started 02/03/12) and patient was instructed to hold dose x 2 day then continue taking 5 mg daily except 2.5 mg on Wednesdays and Fridays while on Septra.  INR supratherapeutic on admission and continues to increase >3.  Patient was still on Septra at the time of admission.  No current drug-drug interactions.  No bleeding reported.  CBC ok.  Goal of Therapy:  INR 2-3   Plan:  Continue to hold warfarin. Follow up INR in AM.   Clance Boll 02/11/2012,12:41 PM

## 2012-02-11 NOTE — Progress Notes (Signed)
   CARE MANAGEMENT NOTE 02/11/2012  Patient:  Jennifer Brennan, Jennifer Brennan   Account Number:  [id number]  Date Initiated:  02/11/2012  Documentation initiated by:  Jiles Crocker  Subjective/Objective Assessment:   ADMITTED WITH CELLULITIS OF THE RLE     Action/Plan:   PCP: [name] Paci, MD  LIVES AT HOME WITH SPOUSE; PATIENT WILL NEED HHC AT DISCHARGE; ATTENDING MD PLEASE WRITE ORDERS FOR HHRN/ PT/ AIDE   Anticipated DC Date:  02/18/2012   Anticipated DC Plan:  HOME W HOME HEALTH SERVICES      DC Planning Services  CM consult               Status of service:  In process, will continue to follow Medicare Important Message given?  NA - LOS <3 / Initial given by admissions (If response is "NO", the following Medicare IM given date fields will be blank)  Per UR Regulation:  Reviewed for med. necessity/level of care/duration of stay  Comments:  02/11/2012- [facility] [place] RN, BSN, MHA

## 2012-02-12 DIAGNOSIS — I82409 Acute embolism and thrombosis of unspecified deep veins of unspecified lower extremity: Secondary | ICD-10-CM

## 2012-02-12 LAB — BASIC METABOLIC PANEL
BUN: 17 mg/dL (ref 6–23)
Calcium: 9 mg/dL (ref 8.4–10.5)
Creatinine, Ser: 1.71 mg/dL — ABNORMAL HIGH (ref 0.50–1.10)
GFR calc non Af Amer: 26 mL/min — ABNORMAL LOW (ref >90)
Glucose, Bld: 103 mg/dL — ABNORMAL HIGH (ref 70–99)
Sodium: 140 mEq/L (ref 135–145)

## 2012-02-12 LAB — CBC
HCT: 34 % — ABNORMAL LOW (ref 36.0–46.0)
Hemoglobin: 11.2 g/dL — ABNORMAL LOW (ref 12.0–15.0)
MCH: 26.7 pg (ref 26.0–34.0)
MCHC: 32.9 g/dL (ref 30.0–36.0)
MCV: 81 fL (ref 78.0–100.0)

## 2012-02-12 LAB — PROTIME-INR: Prothrombin Time: 29.1 seconds — ABNORMAL HIGH (ref 11.6–15.2)

## 2012-02-12 MED ORDER — WARFARIN SODIUM 2.5 MG PO TABS
2.5000 mg | ORAL_TABLET | Freq: Once | ORAL | Status: AC
  Administered 2012-02-12: 2.5 mg via ORAL
  Filled 2012-02-12: qty 1

## 2012-02-12 NOTE — Progress Notes (Signed)
Occupational Therapy Treatment Patient Details Name: Jennifer Brennan MRN: [id number] DOB: 1923/07/31 Today's Date: 02/12/2012 Time: 1191-4782 OT Time Calculation (min): 34 min  OT Assessment / Plan / Recommendation Comments on Treatment Session Pt tolerated treatment well. Family pleased with how well she is able to get herself up from bed and also to lie back down.    Follow Up Recommendations  Home health OT;Supervision/Assistance - 24 hour    Barriers to Discharge       Equipment Recommendations  None recommended by OT    Recommendations for Other Services    Frequency Min 2X/week   Plan Discharge plan remains appropriate    Precautions / Restrictions Precautions Precautions: Fall        ADL  Toilet Transfer: Performed;Min guard with verbal and tactile cues for hand placement and aligning with commode; increased time. Toilet Transfer Method: Surveyor, minerals: Materials engineer and Hygiene: Simulated;Minimal assistance to hold gown while pt performed hygiene.  Where Assessed - Toileting Clothing Manipulation and Hygiene: Sit to stand from 3-in-1 or toilet    OT Diagnosis:    OT Problem List:   OT Treatment Interventions:     OT Goals ADL Goals ADL Goal: Toilet Transfer - Progress: Progressing toward goals ADL Goal: Toileting - Clothing Manipulation - Progress: Progressing toward goals ADL Goal: Toileting - Hygiene - Progress: Progressing toward goals  Visit Information  Last OT Received On: 02/12/12 Assistance Needed: +1    Subjective Data  Subjective: I am kind of tired from my walk Patient Stated Goal: agreeable to work with OT; none stated. daughter wants her to practice in and out of the bed.   Prior Functioning       Cognition  Overall Cognitive Status: History of cognitive impairments - at baseline Arousal/Alertness: Awake/alert Orientation Level: Appears intact for tasks assessed Behavior During  Session: [facility] for tasks performed    Mobility Bed Mobility Bed Mobility: Sit to Supine Supine to Sit: 4: Min guard;HOB flat Sit to Supine: 4: Min guard;HOB flat Details for Bed Mobility Assistance: increased time, verbal and tactile cues for technique and hand placement.  Transfers Transfers: Sit to Stand;Stand to Sit Sit to Stand: 4: Min guard;With upper extremity assist;From bed;From chair/3-in-1 Stand to Sit: 4: Min guard;With upper extremity assist;To chair/3-in-1;To bed Details for Transfer Assistance: verbal cues for hand placement, LE placement (feet under her more), and increased time   Exercises    Balance    End of Session OT - End of Session Activity Tolerance: Patient tolerated treatment well Patient left: in chair;with call bell/phone within reach;with family/visitor present  GO     Lennox Laity [phone] 02/12/2012, 11:44 AM

## 2012-02-12 NOTE — Progress Notes (Signed)
ANTICOAGULATION CONSULT NOTE - Follow Up Consult  Pharmacy Consult for Warfarin Indication: hx of DVT  Allergies  Allergen Reactions  . Morphine Sulfate     REACTION: rash: ITCHING  . Penicillins     REACTION: urticaria (hives)  . Sertraline Hcl     REACTION: rash    Patient Measurements: Height: 5\' 4"  (162.6 cm) Weight: 163 lb 9.3 oz (74.2 kg) IBW/kg (Calculated) : 54.7    Vital Signs: Temp: 98.2 F (36.8 C) (08/22 0500) Temp src: Oral (08/22 0500) BP: 148/73 mmHg (08/22 0500) Pulse Rate: 66  (08/22 0500)  Labs:  Basename 02/12/12 0450 02/11/12 0519 02/10/12 2215 02/10/12 1800 02/10/12 1445  HGB 11.2* 10.7* -- -- --  HCT 34.0* 32.1* -- -- 36.4  PLT 199 207 -- -- 225  APTT -- -- -- -- --  LABPROT 29.1* 38.3* -- -- 34.1*  INR 2.70* 3.84* -- -- 3.31*  HEPARINUNFRC -- -- -- -- --  CREATININE 1.71* 1.96* -- -- 2.31*  CKTOTAL -- -- -- -- 74  CKMB -- -- -- -- --  TROPONINI -- -- <0.30 <0.30 --    Estimated Creatinine Clearance: 23.3 ml/min (by C-G formula based on Cr of 1.71).    Assessment:  88 YOF on chronic warfarin for hx of DVT.    Home dose typically: 5 mg daily except takes 2.5 mg on Fridays per [place] anticoagulation clinic notes.  During 8/16 clinic visit, INR was supratherapeutic 2/2 drug interaction with Septra (started 02/03/12) and patient was instructed to hold dose x 2 day then continue taking 5 mg daily except 2.5 mg on Wednesdays and Fridays while on Septra.  INR supratherapeutic on admission.  Patient was still on Septra at the time of admission (not continued in hospital).  INR today now back down in therapeutic range.   No current drug-drug interactions.  No bleeding reported.  CBC ok.  Goal of Therapy:  INR 2-3   Plan:  1) Warfarin 2.5mg  PO x1 tonight. 2) Follow up INR in AM.   Darrol Angel, PharmD Pager: [phone] 02/12/2012,9:55 AM

## 2012-02-12 NOTE — Progress Notes (Signed)
Talked to patient's daughter about HHC needs, family member requested [facility] because the used them in the past. Norberta Keens RN with [facility] called for arrangements. Abelino Derrick RN,BSN,MHA

## 2012-02-12 NOTE — Progress Notes (Signed)
Physical Therapy Treatment Patient Details Name: Jennifer Brennan MRN: [id number] DOB: [birth year] Today's Date: 02/12/2012 Time: 1025-1050 PT Time Calculation (min): 25 min  PT Assessment / Plan / Recommendation Comments on Treatment Session  Assisted pt out of recliner to [facility] then amb in hallway.  Pt is legally blind and reauires cueing for safety and hand over hand guidance for proper hand placement and awareness of equipment.  Pt plans to return home with family support.    Follow Up Recommendations  Home health PT;Supervision/Assistance - 24 hour    Barriers to Discharge        Equipment Recommendations  None recommended by PT;None recommended by OT    Recommendations for Other Services    Frequency Min 3X/week   Plan Discharge plan remains appropriate    Precautions / Restrictions Precautions Precautions: Fall Precaution Comments: legally blind Restrictions Weight Bearing Restrictions: No    Pertinent Vitals/Pain C/o some ankle pain during gait    Mobility  Bed Mobility Bed Mobility: Not assessed Supine to Sit: 4: Min guard;HOB flat Sit to Supine: 4: Min guard;HOB flat Details for Bed Mobility Assistance: Pt OOB in recliner  Transfers Transfers: Sit to Stand;Stand to Sit Sit to Stand: 4: Min assist;From toilet;From chair/3-in-1 Stand to Sit: 4: Min assist;To chair/3-in-1;To toilet Details for Transfer Assistance: Hand over hand cueing for direction/safety as pt is legally blind.  Increased time.  Ambulation/Gait Ambulation/Gait Assistance: 4: Min assist;3: Mod assist Ambulation Distance (Feet): 75 Feet Assistive device: Rolling walker Ambulation/Gait Assistance Details: Therapist guiding RW as pt is legally blind.  75% VC's to increase posture and step up into walker as pt tends to flex forward. Pt able to amb in hallway then turn around to head back to her room. Gait Pattern: Step-through pattern;Trunk flexed;Narrow base of support;Decreased stride  length;Shuffle Gait velocity: decreased     PT Goals                                  progressing    Visit Information  Last PT Received On: 02/12/12 Assistance Needed: +1    Subjective Data  Subjective: My ankle is hurting Patient Stated Goal: home   Cognition  Overall Cognitive Status: History of cognitive impairments - at baseline Arousal/Alertness: Awake/alert Orientation Level: Appears intact for tasks assessed Behavior During Session: [facility] for tasks performed    Balance   poor  End of Session PT - End of Session Equipment Utilized During Treatment: Gait belt Activity Tolerance: Patient tolerated treatment well Patient left: in chair   Felecia Shelling  PTA [facility]  Acute  Rehab Pager     [phone]

## 2012-02-12 NOTE — Progress Notes (Addendum)
[facility] PROGRESS NOTE  Jennifer Brennan ZOX:[id number] DOB: 06/17/1924 DOA: 02/10/2012 PCP: [name] Paci, MD  Brief narrative: Patient admitted for worsening right lower extremity weeping lesions/cellulitis with the ulcer draining/oozing serosanguineous fluid.   Assessment/Plan:   Principal Problem:  *Cellulitis, right lower extremity  - follow up wound culture results although the specimen not collected in sterile environment  - continue vancomycin - cellulitis seems to be improving  - appreciate wound care consultation   Active Problems:  Acute on chronic kidney disease stage 2  - perhaps due to Bactrim patient was taking as an outpatient regimen for cellulitis  - creatinine is slowly improving, 1.71 today  - renal ultrasound within normal limits   Hypothyroidism  - continue synthroid   Lower extremity edema, shortness of breath  - this is likely due to mild diastolic CHF - 2 D ECHO done on this admission showed normal EF and BNP only sightly elevated at around 500's   GLAUCOMA:  - Continue eyedrops.  - Patient legally blind.   HYPERTENSION:  - will continue home regimen except for ARBs and Lasix due to acute kidney injury   DVT  - continue Coumadin per pharmacy.  SEIZURE DISORDER:  - continue Keppra   GERD  - continue PPI's   Code Status: full code  Family Communication: at bedside  Disposition Plan: home with HHPT and HHRN   Consultants:  Wound care Procedures:  none Antibiotics:  vanco   Manson Passey, MD  [facility] Pager [phone]  If 7PM-7AM, please contact night-coverage [email or web address] Password [email or web address] 02/12/2012, 2:09 PM   LOS: 2 days   HPI/Subjective: No acute events overnight.  Objective: Filed Vitals:   02/11/12 0439 02/11/12 1459 02/11/12 2105 02/12/12 0500  BP: 106/66 133/70 107/56 148/73  Pulse: 64 68 65 66  Temp: 97.5 F (36.4 C) 97.8 F (36.6 C) 98.1 F (36.7 C) 98.2 F (36.8 C)  TempSrc: Oral  Oral Oral Oral  Resp: 18 20 18 18   Height:      Weight: 80.2 kg (176 lb 12.9 oz)   74.2 kg (163 lb 9.3 oz)  SpO2:  92% 94% 90%    Intake/Output Summary (Last 24 hours) at 02/12/12 1409 Last data filed at 02/12/12 1056  Gross per 24 hour  Intake    720 ml  Output      0 ml  Net    720 ml    Exam:   General:  Pt is alert, follows commands appropriately, not in acute distress  Cardiovascular: Regular rate and rhythm, S1/S2, no murmurs, no rubs, no gallops  Respiratory: Clear to auscultation bilaterally, no wheezing, no crackles, no rhonchi  Abdomen: Soft, non tender, non distended, bowel sounds present, no guarding  Extremities: LE edema with healing ulcer right lower extremity, pulses DP and PT palpable bilaterally  Neuro: Grossly nonfocal  Data Reviewed: Basic Metabolic Panel:  Lab 02/12/12 8295 02/11/12 0519 02/10/12 1445  NA 140 138 137  K 5.5* 4.9 4.7  CL 106 105 102  CO2 26 27 26   GLUCOSE 103* 97 86  BUN 17 24* 27*  CREATININE 1.71* 1.96* 2.31*  CALCIUM 9.0 8.7 9.2   Liver Function Tests:  Lab 02/10/12 1445  AST 16  ALT 12  ALKPHOS 52  BILITOT 0.2*  PROT 6.4  ALBUMIN 3.0*   CBC:  Lab 02/12/12 0450 02/11/12 0519 02/10/12 1445  WBC 5.5 4.5 5.5  NEUTROABS -- -- 3.0  HGB 11.2* 10.7* 12.3  HCT 34.0* 32.1*  36.4  MCV 81.0 80.3 80.9  PLT 199 207 225   Cardiac Enzymes:  Lab 02/10/12 2215 02/10/12 1800 02/10/12 1445  CKTOTAL -- -- 74  CKMB -- -- --  CKMBINDEX -- -- --  TROPONINI <0.30 <0.30 --     Recent Results (from the past 240 hour(s))  CULTURE, BLOOD (ROUTINE X 2)     Status: Normal (Preliminary result)   Collection Time   02/10/12  2:45 PM      Component Value Range Status Comment   Specimen Description BLOOD RIGHT WRIST   Final    Special Requests BOTTLES DRAWN AEROBIC AND ANAEROBIC 5CC   Final    Culture  Setup Time 02/11/2012 01:15   Final    Culture     Final    Value:        BLOOD CULTURE RECEIVED NO GROWTH TO DATE CULTURE WILL BE  HELD FOR 5 DAYS BEFORE ISSUING A FINAL NEGATIVE REPORT   Report Status PENDING   Incomplete   WOUND CULTURE     Status: Normal (Preliminary result)   Collection Time   02/10/12  3:03 PM      Component Value Range Status Comment   Specimen Description LEG   Final    Special Requests Normal   Final    Gram Stain     Final    Value: NO WBC SEEN     FEW SQUAMOUS EPITHELIAL CELLS PRESENT     FEW GRAM POSITIVE COCCI IN PAIRS     FEW GRAM NEGATIVE RODS   Culture Culture reincubated for better growth   Final    Report Status PENDING   Incomplete   CULTURE, BLOOD (ROUTINE X 2)     Status: Normal (Preliminary result)   Collection Time   02/10/12  3:35 PM      Component Value Range Status Comment   Specimen Description BLOOD RIGHT ARM   Final    Special Requests BOTTLES DRAWN AEROBIC AND ANAEROBIC   Final    Culture  Setup Time 02/11/2012 01:15   Final    Culture     Final    Value:        BLOOD CULTURE RECEIVED NO GROWTH TO DATE CULTURE WILL BE HELD FOR 5 DAYS BEFORE ISSUING A FINAL NEGATIVE REPORT   Report Status PENDING   Incomplete   MRSA PCR SCREENING     Status: Normal   Collection Time   02/10/12 10:27 PM      Component Value Range Status Comment   MRSA by PCR NEGATIVE  NEGATIVE Final      Studies: US Renal  02/10/2012  *RADIOLOGY REPORT*  Clinical Data: Renal failure  RENAL/URINARY TRACT ULTRASOUND COMPLETE  Comparison:  Lumbar MRI 03/22/2011  Findings:  Right Kidney:  9.7 cm.  Negative for obstruction.  Left Kidney:  Not well visualized by ultrasound.  No definite hydronephrosis.  Accurate  kidney measurements were not possible due to poor visualization of the kidney.  Bladder:  Normal  IMPRESSION: Right kidney appears normal.  Left kidney is not well seen but does not appear to demonstrate hydronephrosis.   Original Report Authenticated By: Camelia Phenes, M.D.    Dg Chest Port 1 View  02/10/2012  *RADIOLOGY REPORT*  Clinical Data: Low oxygen saturation  PORTABLE CHEST - 1 VIEW   Comparison: Prior chest x-ray 01/28/2012, prior chest CT 01/22/2011  Findings: Single portable view of the chest.  No pulmonary edema, or focal airspace consolidation.  Linear opacity in the left retrocardiac region likely reflects a small amount of atelectasis. No pleural effusion, or pneumothorax.  Cardiac and mediastinal contours within normal limits.  Atherosclerotic calcification of the thoracic aorta noted.  No acute osseous findings.  IMPRESSION: No acute cardiopulmonary disease.   Original Report Authenticated By: Vilma Prader     Scheduled Meds:   . brimonidine  1 drop Both Eyes Q12H  . cholecalciferol  1,000 Units Oral Daily  . darifenacin  7.5 mg Oral Daily  . docusate sodium  100 mg Oral BID  . dorzolamide-timolol  1 drop Both Eyes BID  . gabapentin  900 mg Oral BID  . latanoprost  1 drop Both Eyes QHS  . letrozole  2.5 mg Oral QPM  . levETIRAcetam  1,000 mg Oral QHS  . levothyroxine  88 mcg Oral QAC breakfast  . NIFEdipine  30 mg Oral Daily  . pantoprazole  40 mg Oral BID AC  . pilocarpine  1 drop Both Eyes TID  . polyethylene glycol  17 g Oral Daily  . sodium chloride  3 mL Intravenous Q12H  . vancomycin  500 mg Intravenous Q24H  . warfarin  2.5 mg Oral ONCE-1800  . Warfarin - Pharmacist Dosing Inpatient   Does not apply q1800   Continuous Infusions:

## 2012-02-13 LAB — BASIC METABOLIC PANEL
BUN: 15 mg/dL (ref 6–23)
Creatinine, Ser: 1.47 mg/dL — ABNORMAL HIGH (ref 0.50–1.10)
GFR calc Af Amer: 36 mL/min — ABNORMAL LOW (ref >90)
GFR calc non Af Amer: 31 mL/min — ABNORMAL LOW (ref >90)
Glucose, Bld: 119 mg/dL — ABNORMAL HIGH (ref 70–99)

## 2012-02-13 LAB — CBC
HCT: 36.2 % (ref 36.0–46.0)
MCH: 27.7 pg (ref 26.0–34.0)
MCHC: 34.3 g/dL (ref 30.0–36.0)
MCV: 80.8 fL (ref 78.0–100.0)
RDW: 14.9 % (ref 11.5–15.5)

## 2012-02-13 MED ORDER — WARFARIN SODIUM 5 MG PO TABS
5.0000 mg | ORAL_TABLET | Freq: Once | ORAL | Status: AC
  Administered 2012-02-13: 5 mg via ORAL
  Filled 2012-02-13: qty 1

## 2012-02-13 MED ORDER — IPRATROPIUM-ALBUTEROL 18-103 MCG/ACT IN AERO
1.0000 | INHALATION_SPRAY | Freq: Four times a day (QID) | RESPIRATORY_TRACT | Status: DC | PRN
  Administered 2012-02-13: 2 via RESPIRATORY_TRACT

## 2012-02-13 MED ORDER — VANCOMYCIN HCL 1000 MG IV SOLR
750.0000 mg | INTRAVENOUS | Status: DC
  Filled 2012-02-13: qty 750

## 2012-02-13 NOTE — Progress Notes (Signed)
Physical Therapy Treatment Patient Details Name: Jennifer Brennan MRN: [id number] DOB: [birth year] Today's Date: 02/13/2012 Time: 4098-1191 PT Time Calculation (min): 26 min  PT Assessment / Plan / Recommendation Comments on Treatment Session  Pt progressing with ambulation in hallway, however per RN/pts daughter, pts O2 sats yesterday were at 84%.  therefore was placed on 2 LO2 via nasal cannula.  Pts O2 sats sitting EOB without O2 was 89%, therefore ambulated with 2LO2.      Follow Up Recommendations  Home health PT;Supervision/Assistance - 24 hour    Barriers to Discharge        Equipment Recommendations  None recommended by PT;None recommended by OT    Recommendations for Other Services    Frequency Min 3X/week   Plan Discharge plan remains appropriate    Precautions / Restrictions Precautions Precautions: Fall Precaution Comments: legally blind Restrictions Weight Bearing Restrictions: No   Pertinent Vitals/Pain 2/10    Mobility  Bed Mobility Bed Mobility: Supine to Sit Supine to Sit: 4: Min assist;HOB flat Details for Bed Mobility Assistance: Some assist for trunk to attain sitting position.  cues for technique and hand placement.  Transfers Transfers: Sit to Stand;Stand to Sit Sit to Stand: 4: Min assist;From elevated surface;With upper extremity assist;From bed Stand to Sit: 4: Min guard;With upper extremity assist;With armrests;To chair/3-in-1 Details for Transfer Assistance: Hand over hand cuing for hand placement on RW.  Ambulation/Gait Ambulation/Gait Assistance: 4: Min assist;3: Mod assist Ambulation Distance (Feet): 70 Feet Assistive device: Rolling walker Ambulation/Gait Assistance Details: Hand over hand cuing for RW placement and positioning due to pt legally blind.  Provided cues also for directions and sequencing/technique with RW.  Ambulated on 2LO2 since O2 sats at rest on EOB was 89-90%.   Gait Pattern: Step-through pattern;Trunk flexed;Narrow base of  support;Decreased stride length;Shuffle Gait velocity: decreased    Exercises     PT Diagnosis:    PT Problem List:   PT Treatment Interventions:     PT Goals Acute Rehab PT Goals PT Goal Formulation: With patient/family Time For Goal Achievement: 02/18/12 Potential to Achieve Goals: Fair Pt will go Supine/Side to Sit: with supervision PT Goal: Supine/Side to Sit - Progress: Progressing toward goal Pt will go Sit to Stand: with supervision PT Goal: Sit to Stand - Progress: Progressing toward goal Pt will Ambulate: 16 - 50 feet;with min assist;with rolling walker PT Goal: Ambulate - Progress: Progressing toward goal  Visit Information  Last PT Received On: 02/13/12 Assistance Needed: +1    Subjective Data  Subjective: My leg is feeling okay today Patient Stated Goal: home   Cognition  Overall Cognitive Status: History of cognitive impairments - at baseline Arousal/Alertness: Awake/alert Orientation Level: Appears intact for tasks assessed Behavior During Session: [facility] for tasks performed    Balance     End of Session PT - End of Session Activity Tolerance: Patient limited by fatigue Patient left: in chair;with call bell/phone within reach;with family/visitor present Nurse Communication: Mobility status   GP     Page, Meribeth Mattes 02/13/2012, 12:12 PM

## 2012-02-13 NOTE — Progress Notes (Signed)
ANTIBIOTIC CONSULT NOTE - INITIAL  Pharmacy Consult for vancomycin Indication: cellulitis  Allergies  Allergen Reactions  . Morphine Sulfate     REACTION: rash: ITCHING  . Penicillins     REACTION: urticaria (hives)  . Sertraline Hcl     REACTION: rash    Patient Measurements: Height: 5\' 4"  (162.6 cm) Weight: 175 lb 0.7 oz (79.4 kg) (bed scale) IBW/kg (Calculated) : 54.7  Adjusted Body Weight:   Vital Signs:   Intake/Output from previous day: 08/22 0701 - 08/23 0700 In: 680 [P.O.:480; IV Piggyback:200] Out: -  Intake/Output from this shift:    Labs:  Basename 02/13/12 1035 02/12/12 0450 02/11/12 0519  WBC 5.7 5.5 4.5  HGB 12.4 11.2* 10.7*  PLT 250 199 207  LABCREA -- -- --  CREATININE 1.47* 1.71* 1.96*   Estimated Creatinine Clearance: 28 ml/min (by C-G formula based on Cr of 1.47).  Basename 02/13/12 1725  VANCOTROUGH 7.6*  VANCOPEAK --  Drue Dun --  GENTTROUGH --  GENTPEAK --  GENTRANDOM --  TOBRATROUGH --  TOBRAPEAK --  TOBRARND --  AMIKACINPEAK --  AMIKACINTROU --  AMIKACIN --     Microbiology: Recent Results (from the past 720 hour(s))  CULTURE, BLOOD (ROUTINE X 2)     Status: Normal   Collection Time   01/28/12  4:40 PM      Component Value Range Status Comment   Specimen Description BLOOD ARM RIGHT   Final    Special Requests     Final    Value: BOTTLES DRAWN AEROBIC AND ANAEROBIC BLUE 10CC RED 7CC   Culture  Setup Time 01/29/2012 02:34   Final    Culture NO GROWTH 5 DAYS   Final    Report Status 02/04/2012 FINAL   Final   CULTURE, BLOOD (ROUTINE X 2)     Status: Normal   Collection Time   01/28/12  4:50 PM      Component Value Range Status Comment   Specimen Description BLOOD HAND RIGHT   Final    Special Requests BOTTLES DRAWN AEROBIC ONLY 5CC   Final    Culture  Setup Time 01/29/2012 02:34   Final    Culture NO GROWTH 5 DAYS   Final    Report Status 02/04/2012 FINAL   Final   CULTURE, BLOOD (ROUTINE X 2)     Status: Normal  (Preliminary result)   Collection Time   02/10/12  2:45 PM      Component Value Range Status Comment   Specimen Description BLOOD RIGHT WRIST   Final    Special Requests BOTTLES DRAWN AEROBIC AND ANAEROBIC 5CC   Final    Culture  Setup Time 02/11/2012 01:15   Final    Culture     Final    Value:        BLOOD CULTURE RECEIVED NO GROWTH TO DATE CULTURE WILL BE HELD FOR 5 DAYS BEFORE ISSUING A FINAL NEGATIVE REPORT   Report Status PENDING   Incomplete   WOUND CULTURE     Status: Normal (Preliminary result)   Collection Time   02/10/12  3:03 PM      Component Value Range Status Comment   Specimen Description LEG   Final    Special Requests Normal   Final    Gram Stain     Final    Value: NO WBC SEEN     FEW SQUAMOUS EPITHELIAL CELLS PRESENT     FEW GRAM POSITIVE COCCI IN PAIRS  FEW GRAM NEGATIVE RODS   Culture Culture reincubated for better growth   Final    Report Status PENDING   Incomplete   CULTURE, BLOOD (ROUTINE X 2)     Status: Normal (Preliminary result)   Collection Time   02/10/12  3:35 PM      Component Value Range Status Comment   Specimen Description BLOOD RIGHT ARM   Final    Special Requests BOTTLES DRAWN AEROBIC AND ANAEROBIC   Final    Culture  Setup Time 02/11/2012 01:15   Final    Culture     Final    Value:        BLOOD CULTURE RECEIVED NO GROWTH TO DATE CULTURE WILL BE HELD FOR 5 DAYS BEFORE ISSUING A FINAL NEGATIVE REPORT   Report Status PENDING   Incomplete   MRSA PCR SCREENING     Status: Normal   Collection Time   02/10/12 10:27 PM      Component Value Range Status Comment   MRSA by PCR NEGATIVE  NEGATIVE Final     Medical History: Past Medical History  Diagnosis Date  . Hypertension   . Hypothyroidism   . Seizure disorder   . History of cerebrovascular accident   . Severe stage glaucoma     legally blind  . Depression with anxiety   . OAB (overactive bladder)   . ADENOCARCINOMA, LEFT BREAST 04/11/2010    s/p L mastectomy, on femara x 28yr    . BACK PAIN, LUMBAR, CHRONIC   . DVT 05/2007    RLE following R THR - chronic coumadin, chronic RLE pain  . GLAUCOMA   . OSTEOARTHRITIS, HANDS, BILATERAL   . OSTEOPENIA   . Retinal ischemia   . Sciatica of right side     chronic RLE pain, multifactorial - MRI T/L spine 02/2011  . VITAMIN D DEFICIENCY dx 08/2008  . Venous ulcer of right lower extremity with varicose veins   . Dementia     Medications:  Scheduled:    . brimonidine  1 drop Both Eyes Q12H  . cholecalciferol  1,000 Units Oral Daily  . darifenacin  7.5 mg Oral Daily  . docusate sodium  100 mg Oral BID  . dorzolamide-timolol  1 drop Both Eyes BID  . gabapentin  900 mg Oral BID  . latanoprost  1 drop Both Eyes QHS  . letrozole  2.5 mg Oral QPM  . levETIRAcetam  1,000 mg Oral QHS  . levothyroxine  88 mcg Oral QAC breakfast  . NIFEdipine  30 mg Oral Daily  . pantoprazole  40 mg Oral BID AC  . pilocarpine  1 drop Both Eyes TID  . polyethylene glycol  17 g Oral Daily  . sodium chloride  3 mL Intravenous Q12H  . vancomycin  500 mg Intravenous Q24H  . warfarin  5 mg Oral ONCE-1800  . Warfarin - Pharmacist Dosing Inpatient   Does not apply q1800   Assessment: 10 YOF with LE draining ulcer and cellulitis.   Wound culture unremarkable to date. Renal function has improved since admission.  Vancomycin trough today prior to 5th dose was below goal at 7.72mcg/ml on 500mg  IV q24h  Goal of Therapy:  Vancomycin trough level 10-15 mcg/ml  Plan:  1. Change vancomycin to 750mg  IV q24h for estimated new trough of ~11 mcg/ml  Dannielle Huh 02/13/2012,9:11 PM

## 2012-02-13 NOTE — Progress Notes (Signed)
ANTICOAGULATION CONSULT NOTE - Follow Up Consult  Pharmacy Consult for Warfarin Indication: hx of DVT  Allergies  Allergen Reactions  . Morphine Sulfate     REACTION: rash: ITCHING  . Penicillins     REACTION: urticaria (hives)  . Sertraline Hcl     REACTION: rash    Patient Measurements: Height: 5\' 4"  (162.6 cm) Weight: 175 lb 0.7 oz (79.4 kg) (bed scale) IBW/kg (Calculated) : 54.7    Vital Signs: Temp: 97.5 F (36.4 C) (08/23 0636) Temp src: Oral (08/23 0636) BP: 132/68 mmHg (08/23 0636) Pulse Rate: 60  (08/23 0636)  Labs:  Basename 02/13/12 1035 02/13/12 0430 02/12/12 0450 02/11/12 0519 02/10/12 2215 02/10/12 1800 02/10/12 1445  HGB 12.4 -- 11.2* -- -- -- --  HCT 36.2 -- 34.0* 32.1* -- -- --  PLT 250 -- 199 207 -- -- --  APTT -- -- -- -- -- -- --  LABPROT -- 23.2* 29.1* 38.3* -- -- --  INR -- 2.02* 2.70* 3.84* -- -- --  HEPARINUNFRC -- -- -- -- -- -- --  CREATININE 1.47* -- 1.71* 1.96* -- -- --  CKTOTAL -- -- -- -- -- -- 74  CKMB -- -- -- -- -- -- --  TROPONINI -- -- -- -- <0.30 <0.30 --    Estimated Creatinine Clearance: 28 ml/min (by C-G formula based on Cr of 1.47).    Assessment:  88 YOF on chronic warfarin for hx of DVT.    Home dose typically: 5 mg daily except takes 2.5 mg on Fridays per [place] anticoagulation clinic notes.  During 8/16 clinic visit, INR was supratherapeutic 2/2 drug interaction with Septra (started 02/03/12) and patient was instructed to hold dose x 2 day then continue taking 5 mg daily except 2.5 mg on Wednesdays and Fridays while on Septra.  INR supratherapeutic on admission.  Patient was still on Septra at the time of admission (not continued in hospital).  INR remains in therapeutic range, but at low end. Not surprising given warfarin was held 8/20 and 8/21.  No current drug-drug interactions.  No bleeding reported.  CBC ok.  Goal of Therapy:  INR 2-3   Plan:  1) Warfarin 5mg  PO x1 tonight. 2) Follow up INR in  AM.   Darrol Angel, PharmD Pager: [phone] 02/13/2012,11:36 AM

## 2012-02-13 NOTE — Progress Notes (Signed)
[facility] PROGRESS NOTE  Jennifer Brennan AVW:[id number] DOB: 02-13-1924 DOA: 02/10/2012 PCP: [name] Paci, MD  Brief narrative: Patient admitted for worsening right lower extremity weeping lesions/cellulitis with the ulcer draining/oozing serosanguineous fluid.   Assessment/Plan:   Principal Problem:  *Cellulitis, right lower extremity  - follow up wound culture results shows a few gram-positive cocci and few gram-negative rods however recommendation is to re\re incubated for better growth; this will not be necessary because this culture has been obtained in a sterile environment therefore it is not a good representative of bacterial flora associated with this ulceration/cellulitis. - For now we'll continue vancomycin - If patient were to be discharged tomorrow we will likely switch to oral Augmentin - Cellulitis is improving - Per wound care assessment, wound is improving as well - Appreciate wound care input  Active Problems:  Acute on chronic kidney disease stage 2  - Perhaps due to outpatient Bactrim versus acute infection - Creatinine is trending down from 1.71 to 1.47 today  Hypothyroidism  - continue synthroid   Lower extremity edema, shortness of breath  - this is likely due to mild diastolic CHF - 2 D ECHO done on this admission showed normal EF and BNP only sightly elevated at around 500's   GLAUCOMA:  - Continue eyedrops.  - Patient legally blind.   HYPERTENSION:  - Will continue home regimen except for ARBs and Lasix due to acute kidney injury   DVT  - Will continue Coumadin per pharmacy.   SEIZURE DISORDER:  - Will continue Keppra   GERD  - continue PPI's   Diet - Regular  Code Status: full code  Family Communication: at bedside  Disposition Plan: home with HHPT and HHRN; order placed  Consultants:  Wound care Procedures:  none Antibiotics:  vanco  Manson Passey, MD  [facility] Pager [phone]  If 7PM-7AM, please  contact night-coverage [email or web address] Password [email or web address] 02/13/2012, 3:22 PM   LOS: 3 days   HPI/Subjective: No acute events overnight. Patient feels better today  Objective: Filed Vitals:   02/12/12 1500 02/12/12 2103 02/13/12 0500 02/13/12 0636  BP: 105/64 124/74  132/68  Pulse: 62 60  60  Temp: 97.4 F (36.3 C) 97.7 F (36.5 C)  97.5 F (36.4 C)  TempSrc: Oral Oral  Oral  Resp:  20  18  Height:      Weight:   79.4 kg (175 lb 0.7 oz)   SpO2: 92% 94%  98%    Intake/Output Summary (Last 24 hours) at 02/13/12 1522 Last data filed at 02/13/12 1300  Gross per 24 hour  Intake    480 ml  Output    400 ml  Net     80 ml    Exam:   General:  Pt is alert, follows commands appropriately, not in acute distress  Cardiovascular: Regular rate and rhythm, S1/S2, no murmurs, no rubs, no gallops  Respiratory: Clear to auscultation bilaterally, no wheezing, no crackles, no rhonchi  Abdomen: Soft, non tender, non distended, bowel sounds present, no guarding  Extremities: No edema, pulses DP and PT palpable bilaterally; right lower extremity ulceration which seems to be improving  Neuro: Grossly nonfocal  Data Reviewed: Basic Metabolic Panel:  Lab 02/13/12 3086 02/12/12 0450 02/11/12 0519 02/10/12 1445  NA 139 140 138 137  K 4.4 5.5* 4.9 4.7  CL 104 106 105 102  CO2 25 26 27 26   GLUCOSE 119* 103* 97 86  BUN 15 17 24* 27*  CREATININE 1.47*  1.71* 1.96* 2.31*  CALCIUM 9.6 9.0 8.7 9.2   Liver Function Tests:  Lab 02/10/12 1445  AST 16  ALT 12  ALKPHOS 52  BILITOT 0.2*  PROT 6.4  ALBUMIN 3.0*   CBC:  Lab 02/13/12 1035 02/12/12 0450 02/11/12 0519 02/10/12 1445  WBC 5.7 5.5 4.5 5.5  HGB 12.4 11.2* 10.7* 12.3  HCT 36.2 34.0* 32.1* 36.4  MCV 80.8 81.0 80.3 80.9  PLT 250 199 207 225   Cardiac Enzymes:  Lab 02/10/12 2215 02/10/12 1800 02/10/12 1445  CKTOTAL -- -- 74  CKMB -- -- --  CKMBINDEX -- -- --  TROPONINI <0.30 <0.30 --     CULTURE, BLOOD (ROUTINE X 2)      Status: Normal (Preliminary result)   Collection Time   02/10/12  2:45 PM      Component Value Range Status Comment   Culture     Final    Value:        BLOOD CULTURE RECEIVED NO GROWTH TO DATE    Report Status PENDING   Incomplete   WOUND CULTURE     Status: Normal (Preliminary result)   Collection Time   02/10/12  3:03 PM      Component Value Range Status Comment   Specimen Description LEG   Final    Value: NO WBC SEEN     FEW GRAM POSITIVE COCCI IN PAIRS     FEW GRAM NEGATIVE RODS   Culture Culture reincubated for better growth   Final    Report Status PENDING   Incomplete   CULTURE, BLOOD (ROUTINE X 2)     Status: Normal (Preliminary result)   Collection Time   02/10/12  3:35 PM      Component Value Range Status Comment   Culture     Final    Value:        BLOOD CULTURE RECEIVED NO GROWTH TO DATE   Report Status PENDING   Incomplete   MRSA PCR SCREENING     Status: Normal   MRSA by PCR NEGATIVE  NEGATIVE Final      Studies: No results found.  Scheduled Meds:   . brimonidine  1 drop Both Eyes Q12H  . cholecalciferol  1,000 Units Oral Daily  . darifenacin  7.5 mg Oral Daily  . docusate sodium  100 mg Oral BID  . dorzolamide-timolol  1 drop Both Eyes BID  . gabapentin  900 mg Oral BID  . latanoprost  1 drop Both Eyes QHS  . letrozole  2.5 mg Oral QPM  . levETIRAcetam  1,000 mg Oral QHS  . levothyroxine  88 mcg Oral QAC breakfast  . NIFEdipine  30 mg Oral Daily  . pantoprazole  40 mg Oral BID AC  . pilocarpine  1 drop Both Eyes TID  . polyethylene glycol  17 g Oral Daily  . sodium chloride  3 mL Intravenous Q12H  . vancomycin  500 mg Intravenous Q24H  . warfarin  2.5 mg Oral ONCE-1800

## 2012-02-14 LAB — WOUND CULTURE
Gram Stain: NONE SEEN
Special Requests: NORMAL

## 2012-02-14 LAB — CBC
HCT: 34 % — ABNORMAL LOW (ref 36.0–46.0)
Hemoglobin: 11.2 g/dL — ABNORMAL LOW (ref 12.0–15.0)
MCHC: 32.9 g/dL (ref 30.0–36.0)
MCV: 82.5 fL (ref 78.0–100.0)
RDW: 15 % (ref 11.5–15.5)

## 2012-02-14 LAB — BASIC METABOLIC PANEL
BUN: 13 mg/dL (ref 6–23)
Chloride: 107 mEq/L (ref 96–112)
Creatinine, Ser: 1.46 mg/dL — ABNORMAL HIGH (ref 0.50–1.10)
GFR calc Af Amer: 36 mL/min — ABNORMAL LOW (ref >90)
Glucose, Bld: 103 mg/dL — ABNORMAL HIGH (ref 70–99)

## 2012-02-14 MED ORDER — DIPHENHYDRAMINE HCL 25 MG PO CAPS: 12.5000 mg | ORAL_CAPSULE | ORAL | Status: DC

## 2012-02-14 MED ORDER — OXYCODONE-ACETAMINOPHEN 5-325 MG PO TABS: 1.0000 | ORAL_TABLET | Freq: Three times a day (TID) | ORAL | Status: DC | PRN

## 2012-02-14 MED ORDER — TIOTROPIUM BROMIDE MONOHYDRATE 18 MCG IN CAPS: 18.0000 ug | ORAL_CAPSULE | Freq: Every day | RESPIRATORY_TRACT | Status: DC

## 2012-02-14 MED ORDER — FLUTICASONE-SALMETEROL 250-50 MCG/DOSE IN AEPB
1.0000 | INHALATION_SPRAY | Freq: Two times a day (BID) | RESPIRATORY_TRACT | Status: DC
  Filled 2012-02-14: qty 14

## 2012-02-14 MED ORDER — DOXYCYCLINE HYCLATE 100 MG PO TABS: 100.0000 mg | ORAL_TABLET | Freq: Two times a day (BID) | ORAL | Status: AC

## 2012-02-14 MED ORDER — DIPHENHYDRAMINE HCL 12.5 MG/5ML PO ELIX
12.5000 mg | ORAL_SOLUTION | ORAL | Status: AC
  Administered 2012-02-14: 12.5 mg via ORAL
  Filled 2012-02-14 (×2): qty 5

## 2012-02-14 MED ORDER — ALBUTEROL SULFATE (5 MG/ML) 0.5% IN NEBU: 2.5000 mg | INHALATION_SOLUTION | RESPIRATORY_TRACT | Status: DC | PRN

## 2012-02-14 MED ORDER — WARFARIN SODIUM 6 MG PO TABS
6.0000 mg | ORAL_TABLET | Freq: Once | ORAL | Status: DC
  Filled 2012-02-14: qty 1

## 2012-02-14 MED ORDER — TIOTROPIUM BROMIDE MONOHYDRATE 18 MCG IN CAPS
18.0000 ug | ORAL_CAPSULE | Freq: Every day | RESPIRATORY_TRACT | Status: DC
  Filled 2012-02-14: qty 5

## 2012-02-14 MED ORDER — PREDNISONE (PAK) 10 MG PO TABS: 50.0000 mg | ORAL_TABLET | Freq: Every day | ORAL | Status: AC

## 2012-02-14 MED ORDER — IPRATROPIUM BROMIDE 0.02 % IN SOLN: 0.5000 mg | RESPIRATORY_TRACT | Status: DC | PRN

## 2012-02-14 MED ORDER — FENTANYL 12 MCG/HR TD PT72: 1.0000 | MEDICATED_PATCH | TRANSDERMAL | Status: DC

## 2012-02-14 MED ORDER — IPRATROPIUM-ALBUTEROL 18-103 MCG/ACT IN AERO: 1.0000 | INHALATION_SPRAY | Freq: Four times a day (QID) | RESPIRATORY_TRACT | Status: DC | PRN

## 2012-02-14 MED ORDER — FLUTICASONE-SALMETEROL 250-50 MCG/DOSE IN AEPB: 1.0000 | INHALATION_SPRAY | Freq: Two times a day (BID) | RESPIRATORY_TRACT | Status: DC

## 2012-02-14 NOTE — Progress Notes (Addendum)
SATURATION QUALIFICATIONS:  Patient Saturations on Room Air at Rest = 94%  Patient Saturations on Room Air while Ambulating = 86%  Patient Saturations on 2L Liters of oxygen while Ambulating = 95%  Statement of medical necessity for home oxygen:  Drops without O2 to 86%

## 2012-02-14 NOTE — Progress Notes (Signed)
Spoke with CM, working on getting patient set up with home oxygen.

## 2012-02-14 NOTE — Discharge Summary (Signed)
Physician Discharge Summary  Jennifer Brennan NWG:[id number] DOB: [birth year] DOA: 02/10/2012  PCP: [name] Paci, MD  Admit date: 02/10/2012 Discharge date: 02/14/2012  Recommendations for Outpatient Follow-up:  1. Follow up with PCP in 1 week 2. We have stopped lasix and Diovan due to acute kidney injury 3. Continue doxycycline for 14 days upon discharge for cellulitis 4. Start spiriva and advair for COPD and prednisone for 5 days PO 5. Home oxygen ordered  Discharge Diagnoses:  Principal Problem:  *Cellulitis Active Problems:  HYPOTHYROIDISM  GLAUCOMA  HYPERTENSION  DVT  BACK PAIN, LUMBAR, CHRONIC  SEIZURE DISORDER  GERD (gastroesophageal reflux disease)  Venous ulcer of right lower extremity with varicose veins  Peripheral arterial disease  Acute renal insufficiency  Weakness generalized  Discharge Condition: medically stable for discharge  Diet recommendation: ass tolerated  History of present illness:  Patient admitted for worsening right lower extremity weeping lesions/cellulitis with the ulcer draining/oozing serosanguineous fluid.   Assessment/Plan:   Principal Problem:  *Cellulitis, right lower extremity  - follow up wound culture results shows a few gram-positive cocci and few gram-negative rods however recommendation is to re\re incubated for better growth; this will not be necessary because this culture has been obtained in a sterile environment therefore it is not a good representative of bacterial flora associated with this ulceration/cellulitis.  - patient to be discharged home with doxycycline for 14 days - Cellulitis is improving; dressing changes to be done by family members and RN on monday - Per wound care assessment, wound is improving as well  - Appreciate wound care input   Active Problems:  COPD - patient was a long time smoker - wheezing on exam but bilateral air entry - we will discharge home with advair and spiriva - xopenex rescue  inhaler on discharge - prednisone for 5 days  Acute on chronic kidney disease stage 2  - Perhaps due to outpatient Bactrim versus acute infection  - Creatinine is trending down; this can be followed up with PCP - lasix and Diovan stopped temporarily due to AKI  Hypothyroidism  - continue synthroid   Lower extremity edema, shortness of breath  - this is likely due to mild diastolic CHF - 2 D ECHO done on this admission showed normal EF and BNP only sightly elevated at around 500's   GLAUCOMA:  - Continue eyedrops per home regimen - Patient legally blind.   HYPERTENSION:  - home medication except for diovan and lasix due to AKI  DVT  - Will continue Coumadin per home regimen  SEIZURE DISORDER:  - can continue Keppra   GERD  - continue PPI's   Diet  - Regular   Code Status: full code  Family Communication: at bedside  Disposition Plan: home with HHPT and HHRN; orders placed; per patient's daughter they [facility] supposed to follow up on Monday; daughter feels comfortable with dressing changes to right lower extremity  Consultants:  Wound care Procedures:  none Antibiotics:  vanco  Discharge Exam: Filed Vitals:   02/14/12 0527  BP: 131/73  Pulse: 59  Temp: 97.6 F (36.4 C)  Resp: 20   Filed Vitals:   02/13/12 2132 02/13/12 2254 02/14/12 0500 02/14/12 0527  BP: 133/80   131/73  Pulse: 62   59  Temp: 97.7 F (36.5 C)   97.6 F (36.4 C)  TempSrc: Oral   Oral  Resp: 20   20  Height:      Weight:   78.9 kg (173 lb 15.1 oz)  SpO2: 95% 98%  94%    General: Pt is alert, follows commands appropriately, not in acute distress Cardiovascular: Regular rate and rhythm, S1/S2 +, no murmurs, no rubs, no gallops Respiratory: Clear to auscultation bilaterally, no wheezing, no crackles, no rhonchi Abdominal: Soft, non tender, non distended, bowel sounds +, no guarding Extremities: no edema, no cyanosis, pulses palpable bilaterally DP and PT Neuro: Grossly  nonfocal  Discharge Instructions  Discharge Orders    Future Appointments: Provider: Department: Dept Phone: Center:   03/01/2012 3:30 PM Su Monks, PA-C [facility] [phone] [facility]   03/15/2012 2:30 PM Newt Lukes, MD [facility] [phone] [facility]   04/27/2012 1:00 PM Radene Gunning [facility] [phone] None   05/04/2012 2:15 PM Amy Allegra Grana, PA [facility] [phone] None   05/14/2012 3:30 PM [facility] [phone] [facility]   05/14/2012 4:00 PM Fransisco Hertz, MD [phone] [facility]     Future Orders Please Complete By Expires   Diet - low sodium heart healthy      Increase activity slowly      Discharge instructions      Comments:   Please note that we have stopped lasix and Diovan due to acute kidney injury When you follow up with your PCP in about 1 week please have your kidney function rechecked and let your PCP decide if you need to resume those 2 medications For COPD we have started you on spiriva, advair for long term control and xopenex rescue inhaler     Medication List  As of 02/14/2012  8:12 AM   STOP taking these medications         furosemide 20 MG tablet      letrozole 2.5 MG tablet      sulfamethoxazole-trimethoprim 800-160 MG per tablet      valsartan 80 MG tablet         TAKE these medications         albuterol 108 (90 BASE) MCG/ACT inhaler   Commonly known as: PROVENTIL HFA;VENTOLIN HFA   Inhale 2 puffs into the lungs every 6 (six) hours as needed. For shortness of breath.      albuterol-ipratropium 18-103 MCG/ACT inhaler   Commonly known as: COMBIVENT   Inhale 1-2 puffs into the lungs every 6 (six) hours as needed for wheezing or shortness of breath.      brimonidine 0.15 % ophthalmic solution   Commonly known as: ALPHAGAN   Place 1 drop into both eyes every 12 (twelve) hours.      cholecalciferol 1000 UNITS tablet   Commonly known as: VITAMIN D   Take 1,000 Units by mouth daily.       diphenhydrAMINE 25 MG tablet   Commonly known as: BENADRYL   Take 25 mg by mouth as needed. Take with oxycodone to reduce itching      docusate sodium 100 MG capsule   Commonly known as: COLACE   Take 100 mg by mouth 2 (two) times daily.      dorzolamide-timolol 22.3-6.8 MG/ML ophthalmic solution   Commonly known as: COSOPT   Place 1 drop into both eyes 2 (two) times daily.      doxycycline 100 MG tablet   Commonly known as: VIBRA-TABS   Take 1 tablet (100 mg total) by mouth 2 (two) times daily.      fentaNYL 12 MCG/HR   Commonly known as: DURAGESIC - dosed mcg/hr   Place 1 patch (12.5 mcg total) onto the  skin every 3 (three) days.      Fluticasone-Salmeterol 250-50 MCG/DOSE Aepb   Commonly known as: ADVAIR   Inhale 1 puff into the lungs 2 (two) times daily.      gabapentin 300 MG capsule   Commonly known as: NEURONTIN   Take 900 mg by mouth 2 (two) times daily.      latanoprost 0.005 % ophthalmic solution   Commonly known as: XALATAN   Place 1 drop into both eyes at bedtime.      levETIRAcetam 500 MG 24 hr tablet   Commonly known as: KEPPRA XR   Take 1,000 mg by mouth at bedtime.      levothyroxine 88 MCG tablet   Commonly known as: SYNTHROID, LEVOTHROID   Take 88 mcg by mouth daily.      multivitamin with minerals Tabs   Take 1 tablet by mouth daily.      NIFEdipine 30 MG 24 hr tablet   Commonly known as: PROCARDIA-XL/ADALAT-CC/NIFEDICAL-XL   Take 30 mg by mouth daily.      omeprazole 20 MG capsule   Commonly known as: PRILOSEC   Take 20 mg by mouth 2 (two) times daily.      oxyCODONE-acetaminophen 5-325 MG per tablet   Commonly known as: PERCOCET/ROXICET   Take 1 tablet by mouth every 8 (eight) hours as needed for pain. For pain.      pilocarpine 1 % ophthalmic solution   Commonly known as: PILOCAR   Place 1 drop into both eyes 3 (three) times daily.      predniSONE 10 MG tablet   Commonly known as: STERAPRED UNI-PAK   Take 5 tablets (50 mg total)  by mouth daily. Take for 5 days      solifenacin 5 MG tablet   Commonly known as: VESICARE   Take 5 mg by mouth daily.      tiotropium 18 MCG inhalation capsule   Commonly known as: SPIRIVA   Place 1 capsule (18 mcg total) into inhaler and inhale daily.      warfarin 5 MG tablet   Commonly known as: COUMADIN   Take 2.5-5 mg by mouth daily. 1 tab daily except for 0.5 tabs on Wednesdays and Fridays.           Follow-up Information    Follow up with [name] Paci, MD in 1 week.   Contact information:   [address] [facility] : [facility] [address] [place] [address] [phone]           The results of significant diagnostics from this hospitalization (including imaging, microbiology, ancillary and laboratory) are listed below for reference.    Significant Diagnostic Studies: Dg Chest 2 View  01/28/2012  *RADIOLOGY REPORT*  Clinical Data: Cough, wheezing  CHEST - 2 VIEW  Comparison: 01/22/2011  Findings: Cardiomediastinal silhouette is stable.  Elevation of the right hemidiaphragm again noted.  No acute infiltrate or pleural effusion.  No pulmonary edema.  Bony thorax is stable.  IMPRESSION: No active disease.  No significant change.  Original Report Authenticated By: Natasha Mead, M.D.   Dg Tibia/fibula Right  01/28/2012  *RADIOLOGY REPORT*  Clinical Data: Leg pain  RIGHT TIBIA AND FIBULA - 2 VIEW  Comparison: None.  Findings: Four views of the right tibia-fibula submitted. Chondrocalcinosis noted right knee joint.  Sclerotic changes distal right femur probable due to prior avascular necrosis.  No acute fracture or subluxation.  No periosteal reaction or bony erosion.  IMPRESSION: No acute  fracture or subluxation.  No periosteal reaction or bony erosion.  Chondrocalcinosis noted right knee joint.  Original Report Authenticated By: Natasha Mead, M.D.   US Renal  02/10/2012  *RADIOLOGY REPORT*  Clinical Data: Renal failure  RENAL/URINARY TRACT ULTRASOUND COMPLETE   Comparison:  Lumbar MRI 03/22/2011  Findings:  Right Kidney:  9.7 cm.  Negative for obstruction.  Left Kidney:  Not well visualized by ultrasound.  No definite hydronephrosis.  Accurate  kidney measurements were not possible due to poor visualization of the kidney.  Bladder:  Normal  IMPRESSION: Right kidney appears normal.  Left kidney is not well seen but does not appear to demonstrate hydronephrosis.   Original Report Authenticated By: Camelia Phenes, M.D.    Dg Chest Port 1 View  02/10/2012  *RADIOLOGY REPORT*  Clinical Data: Low oxygen saturation  PORTABLE CHEST - 1 VIEW  Comparison: Prior chest x-ray 01/28/2012, prior chest CT 01/22/2011  Findings: Single portable view of the chest.  No pulmonary edema, or focal airspace consolidation.  Linear opacity in the left retrocardiac region likely reflects a small amount of atelectasis. No pleural effusion, or pneumothorax.  Cardiac and mediastinal contours within normal limits.  Atherosclerotic calcification of the thoracic aorta noted.  No acute osseous findings.  IMPRESSION: No acute cardiopulmonary disease.   Original Report Authenticated By: Vilma Prader     Microbiology: Recent Results (from the past 240 hour(s))  CULTURE, BLOOD (ROUTINE X 2)     Status: Normal (Preliminary result)   Collection Time   02/10/12  2:45 PM      Component Value Range Status Comment   Specimen Description BLOOD RIGHT WRIST   Final    Special Requests BOTTLES DRAWN AEROBIC AND ANAEROBIC 5CC   Final    Culture  Setup Time 02/11/2012 01:15   Final    Culture     Final    Value:        BLOOD CULTURE RECEIVED NO GROWTH TO DATE CULTURE WILL BE HELD FOR 5 DAYS BEFORE ISSUING A FINAL NEGATIVE REPORT   Report Status PENDING   Incomplete   WOUND CULTURE     Status: Normal   Collection Time   02/10/12  3:03 PM      Component Value Range Status Comment   Specimen Description LEG   Final    Special Requests Normal   Final    Gram Stain     Final    Value: NO WBC SEEN     FEW  SQUAMOUS EPITHELIAL CELLS PRESENT     FEW GRAM POSITIVE COCCI IN PAIRS     FEW GRAM NEGATIVE RODS   Culture     Final    Value: MULTIPLE ORGANISMS PRESENT, NONE PREDOMINANT     Note: NO STAPHYLOCOCCUS AUREUS ISOLATED NO GROUP A STREP (S.PYOGENES) ISOLATED   Report Status 02/14/2012 FINAL   Final   CULTURE, BLOOD (ROUTINE X 2)     Status: Normal (Preliminary result)   Collection Time   02/10/12  3:35 PM      Component Value Range Status Comment   Specimen Description BLOOD RIGHT ARM   Final    Special Requests BOTTLES DRAWN AEROBIC AND ANAEROBIC   Final    Culture  Setup Time 02/11/2012 01:15   Final    Culture     Final    Value:        BLOOD CULTURE RECEIVED NO GROWTH TO DATE CULTURE WILL BE HELD FOR 5 DAYS BEFORE ISSUING A  FINAL NEGATIVE REPORT   Report Status PENDING   Incomplete   MRSA PCR SCREENING     Status: Normal   Collection Time   02/10/12 10:27 PM      Component Value Range Status Comment   MRSA by PCR NEGATIVE  NEGATIVE Final      Labs: Basic Metabolic Panel:  Lab 02/14/12 4098 02/13/12 1035 02/12/12 0450 02/11/12 0519 02/10/12 2215 02/10/12 1445  NA 141 139 140 138 -- 137  K 4.9 4.4 5.5* 4.9 -- 4.7  CL 107 104 106 105 -- 102  CO2 29 25 26 27  -- 26  GLUCOSE 103* 119* 103* 97 -- 86  BUN 13 15 17  24* -- 27*  CREATININE 1.46* 1.47* 1.71* 1.96* -- 2.31*  CALCIUM 9.1 9.6 9.0 8.7 -- 9.2  MG -- -- -- -- 2.3 --  PHOS -- -- -- -- 3.4 --   Liver Function Tests:  Lab 02/10/12 1445  AST 16  ALT 12  ALKPHOS 52  BILITOT 0.2*  PROT 6.4  ALBUMIN 3.0*   No results found for this basename: LIPASE:5,AMYLASE:5 in the last 168 hours No results found for this basename: AMMONIA:5 in the last 168 hours CBC:  Lab 02/14/12 0610 02/13/12 1035 02/12/12 0450 02/11/12 0519 02/10/12 1445  WBC 5.9 5.7 5.5 4.5 5.5  NEUTROABS -- -- -- -- 3.0  HGB 11.2* 12.4 11.2* 10.7* 12.3  HCT 34.0* 36.2 34.0* 32.1* 36.4  MCV 82.5 80.8 81.0 80.3 80.9  PLT 240 250 199 207 225   Cardiac  Enzymes:  Lab 02/10/12 2215 02/10/12 1800 02/10/12 1445  CKTOTAL -- -- 74  CKMB -- -- --  CKMBINDEX -- -- --  TROPONINI <0.30 <0.30 --   BNP: BNP (last 3 results)  Basename 02/10/12 2215  PROBNP 547.6*   CBG: No results found for this basename: GLUCAP:5 in the last 168 hours  Time coordinating discharge: Over 30 minutes  Signed:  Manson Passey, MD  [facility] 02/14/2012, 8:12 AM  Pager #: [phone]

## 2012-02-14 NOTE — Progress Notes (Signed)
ANTICOAGULATION CONSULT NOTE - Follow Up Consult  Pharmacy Consult for Warfarin Indication: hx of DVT  Allergies  Allergen Reactions  . Morphine Sulfate     REACTION: rash: ITCHING  . Penicillins     REACTION: urticaria (hives)  . Sertraline Hcl     REACTION: rash    Patient Measurements: Height: 5\' 4"  (162.6 cm) Weight: 173 lb 15.1 oz (78.9 kg) (bed scale) IBW/kg (Calculated) : 54.7    Vital Signs: Temp: 97.6 F (36.4 C) (08/24 0527) Temp src: Oral (08/24 0527) BP: 131/73 mmHg (08/24 0527) Pulse Rate: 59  (08/24 0527)  Labs:  Basename 02/14/12 0610 02/13/12 1035 02/13/12 0430 02/12/12 0450  HGB 11.2* 12.4 -- --  HCT 34.0* 36.2 -- 34.0*  PLT 240 250 -- 199  APTT -- -- -- --  LABPROT 22.1* -- 23.2* 29.1*  INR 1.90* -- 2.02* 2.70*  HEPARINUNFRC -- -- -- --  CREATININE 1.46* 1.47* -- 1.71*  CKTOTAL -- -- -- --  CKMB -- -- -- --  TROPONINI -- -- -- --    Estimated Creatinine Clearance: 28.1 ml/min (by C-G formula based on Cr of 1.46).    Assessment:  88 YOF on chronic warfarin for hx of DVT.    Home dose typically: 5 mg daily except takes 2.5 mg on Fridays per [facility] anticoagulation clinic notes.  During 8/16 clinic visit, INR was supratherapeutic 2/2 drug interaction with Septra (started 02/03/12) and patient was instructed to hold dose x 2 day then continue taking 5 mg daily except 2.5 mg on Wednesdays and Fridays while on Septra.  INR supratherapeutic on admission.  Patient was still on Septra at the time of admission (not continued in hospital).  INR now just below goal after doses of 2.5mg  and 5mg  and held the previous 2 days  No current drug-drug interactions.  No bleeding reported.  CBC ok.  Goal of Therapy:  INR 2-3   Plan:  1) Increase warfarin to 6mg  tonight 2) Follow up INR in AM.   Hessie Knows, PharmD, BCPS Pager [phone] 02/14/2012 9:26 AM

## 2012-02-14 NOTE — Progress Notes (Signed)
New order for Home oxygen therapy. [facility] notified of DME referral. Md orders faxed to [facility] [phone]. [facility] rep delivered equipment to room prior to discharge. Per intake rep Britta Mccreedy at [facility] start of care between 8/26-8/28. No other needs specified at this time. Patient to discharge home with family at the bedside.

## 2012-02-14 NOTE — Progress Notes (Signed)
Patient's O2 on RA while sitting = 95%.  When moving from bed to chair drops to 86%.  Md aware and ordering home oxygen when discharged.

## 2012-02-16 ENCOUNTER — Other Ambulatory Visit: Payer: Self-pay | Admitting: Oncology

## 2012-02-16 ENCOUNTER — Encounter: Payer: Medicare Other | Admitting: Physical Medicine and Rehabilitation

## 2012-02-17 LAB — CULTURE, BLOOD (ROUTINE X 2): Culture: NO GROWTH

## 2012-02-19 ENCOUNTER — Ambulatory Visit (INDEPENDENT_AMBULATORY_CARE_PROVIDER_SITE_OTHER): Payer: Medicare Other | Admitting: *Deleted

## 2012-02-19 ENCOUNTER — Encounter (HOSPITAL_BASED_OUTPATIENT_CLINIC_OR_DEPARTMENT_OTHER): Payer: Medicare Other | Attending: Internal Medicine

## 2012-02-19 DIAGNOSIS — L97809 Non-pressure chronic ulcer of other part of unspecified lower leg with unspecified severity: Secondary | ICD-10-CM | POA: Insufficient documentation

## 2012-02-19 DIAGNOSIS — M899 Disorder of bone, unspecified: Secondary | ICD-10-CM | POA: Insufficient documentation

## 2012-02-19 DIAGNOSIS — Z8673 Personal history of transient ischemic attack (TIA), and cerebral infarction without residual deficits: Secondary | ICD-10-CM | POA: Insufficient documentation

## 2012-02-19 DIAGNOSIS — I872 Venous insufficiency (chronic) (peripheral): Secondary | ICD-10-CM | POA: Insufficient documentation

## 2012-02-19 DIAGNOSIS — I82409 Acute embolism and thrombosis of unspecified deep veins of unspecified lower extremity: Secondary | ICD-10-CM

## 2012-02-19 DIAGNOSIS — E039 Hypothyroidism, unspecified: Secondary | ICD-10-CM | POA: Insufficient documentation

## 2012-02-19 DIAGNOSIS — I639 Cerebral infarction, unspecified: Secondary | ICD-10-CM

## 2012-02-19 DIAGNOSIS — I6350 Cerebral infarction due to unspecified occlusion or stenosis of unspecified cerebral artery: Secondary | ICD-10-CM

## 2012-02-19 DIAGNOSIS — Z86718 Personal history of other venous thrombosis and embolism: Secondary | ICD-10-CM | POA: Insufficient documentation

## 2012-02-19 DIAGNOSIS — I10 Essential (primary) hypertension: Secondary | ICD-10-CM | POA: Insufficient documentation

## 2012-02-19 LAB — POCT INR: INR: 2.2

## 2012-02-19 NOTE — Progress Notes (Signed)
[facility]  NAME:  Jennifer Brennan, Jennifer Brennan             ACCOUNT NO.:  [id number]  MEDICAL RECORD NO.:  [id number]      DATE OF BIRTH:  November 26, 1923  PHYSICIAN:  Maxwell Caul, M.D. VISIT DATE:  02/19/2012                                  OFFICE VISIT   [name] is an 78 year old woman who arrived accompanied by her husband and daughter for review of wounds on her right lower leg.  The history is that she has had severe skin discoloration in the right lower extremity for the past several years and over the last 2-3 years, has had intermittent wounds largely in the medial right leg where the current wound is also present.  Her husband says that these will form, gradually close over, and stay that way for several months and then reopen.  They have had several cycles of this.  The patient has graded pressure stockings, but it does not sound as though she wears them.  She does not have a history of trauma.  She is not a diabetic.  She has been on a course of doxycycline recently for a cellulitis involving the right leg.  It would appear that she was seen in urgent care on February 14, 2012.  Culture results showed a few gram-positive cocci, a few gram- negative rods.  I do not see a final culture results.  She is completing a 14 day course of doxycycline.  X-ray showed no acute fracture or subluxation.  There was no suggestion of osteomyelitis.  PAST MEDICAL HISTORY:  Hypertension, hypothyroidism, seizure disorder, late effect CVA, glaucoma, depression with anxiety, chronic back pain, adenocarcinoma of the left breast, DVT, osteoarthritis, osteopenia, sciatic, retinal ischemia, vitamin D deficiency, emphysema.  MEDICATION LIST:  Reviewed.  She is on doxycycline, which I believe was started on February 14, 2012.  She is also noted to be on Coumadin.  PHYSICAL EXAMINATION:  VITAL SIGNS:  Temperature of 97.6, pulse 58, respirations 17, blood pressure 159/74. GENERAL:  The  patient is not in any distress. RESPIRATORY:  Shallow, but otherwise clear air entry.  CARDIAC:  Heart Sounds are normal.  Her JVP is not elevated.  There is no evidence of heart failure. EXTREMITIES:  Wound exam over the right medial leg.  The area measures 8.4 x 7.3 x 0.1.  This is surrounded by macerated skin.  Her ABI on the right is actually quite satisfactory at 1.19 calculated in this clinic. There is severe surrounding venous stasis over a large part of her right lower extremity; however, her peripheral pulses are easily palpable in her feet.  IMPRESSIONS:  Severe venous insufficiency of the right leg with recurrent venous stasis ulceration.  The areas is moist and somewhat macerated.  We applied silver alginate to this wound with a Profore Lite wrap.  We will follow up on this in a week's time.          ______________________________ Maxwell Caul, M.D.     MGR/MEDQ  D:  02/19/2012  T:  02/19/2012  Job:  [id number]

## 2012-02-20 ENCOUNTER — Encounter: Payer: Self-pay | Admitting: Cardiology

## 2012-02-24 ENCOUNTER — Telehealth: Payer: Self-pay | Admitting: Internal Medicine

## 2012-02-24 DIAGNOSIS — I5030 Unspecified diastolic (congestive) heart failure: Secondary | ICD-10-CM

## 2012-02-24 DIAGNOSIS — I872 Venous insufficiency (chronic) (peripheral): Secondary | ICD-10-CM

## 2012-02-25 NOTE — Telephone Encounter (Signed)
Refer to Dr Excell Seltzer made as requested -  ([place] - please document the issue/concern under "documentation" tab - thanks!)

## 2012-02-26 ENCOUNTER — Telehealth: Payer: Self-pay | Admitting: Cardiovascular Disease

## 2012-02-26 ENCOUNTER — Ambulatory Visit: Payer: Self-pay | Admitting: Cardiovascular Disease

## 2012-02-26 ENCOUNTER — Encounter (HOSPITAL_BASED_OUTPATIENT_CLINIC_OR_DEPARTMENT_OTHER): Payer: Medicare Other | Attending: Internal Medicine

## 2012-02-26 DIAGNOSIS — I872 Venous insufficiency (chronic) (peripheral): Secondary | ICD-10-CM | POA: Insufficient documentation

## 2012-02-26 DIAGNOSIS — L97809 Non-pressure chronic ulcer of other part of unspecified lower leg with unspecified severity: Secondary | ICD-10-CM | POA: Insufficient documentation

## 2012-02-26 DIAGNOSIS — I639 Cerebral infarction, unspecified: Secondary | ICD-10-CM

## 2012-02-26 DIAGNOSIS — I82409 Acute embolism and thrombosis of unspecified deep veins of unspecified lower extremity: Secondary | ICD-10-CM

## 2012-02-26 NOTE — Telephone Encounter (Signed)
Result addressed on 02/26/12 see anticoagulation note in EPIC.

## 2012-02-26 NOTE — Telephone Encounter (Signed)
New Problem:    Called in wanting to report the patient's PT 20.7 /INR 1.7, missed dose on Tuesday and is currently taking Cipro and Doxycycline.  Please call back.

## 2012-02-27 ENCOUNTER — Inpatient Hospital Stay (HOSPITAL_COMMUNITY): Payer: Medicare Other

## 2012-02-27 ENCOUNTER — Emergency Department (HOSPITAL_COMMUNITY): Payer: Medicare Other

## 2012-02-27 ENCOUNTER — Inpatient Hospital Stay (HOSPITAL_COMMUNITY)
Admission: EM | Admit: 2012-02-27 | Discharge: 2012-03-01 | DRG: 603 | Disposition: A | Discharge status: Skilled Nursing Facility | Payer: Medicare Other | Attending: Internal Medicine | Admitting: Internal Medicine

## 2012-02-27 ENCOUNTER — Encounter (HOSPITAL_COMMUNITY): Payer: Self-pay | Admitting: Family Medicine

## 2012-02-27 DIAGNOSIS — I739 Peripheral vascular disease, unspecified: Secondary | ICD-10-CM

## 2012-02-27 DIAGNOSIS — L0390 Cellulitis, unspecified: Secondary | ICD-10-CM | POA: Diagnosis present

## 2012-02-27 DIAGNOSIS — K219 Gastro-esophageal reflux disease without esophagitis: Secondary | ICD-10-CM

## 2012-02-27 DIAGNOSIS — I639 Cerebral infarction, unspecified: Secondary | ICD-10-CM

## 2012-02-27 DIAGNOSIS — R569 Unspecified convulsions: Secondary | ICD-10-CM

## 2012-02-27 DIAGNOSIS — I83019 Varicose veins of right lower extremity with ulcer of unspecified site: Secondary | ICD-10-CM | POA: Diagnosis present

## 2012-02-27 DIAGNOSIS — L03119 Cellulitis of unspecified part of limb: Principal | ICD-10-CM | POA: Diagnosis present

## 2012-02-27 DIAGNOSIS — I10 Essential (primary) hypertension: Secondary | ICD-10-CM

## 2012-02-27 DIAGNOSIS — Z1231 Encounter for screening mammogram for malignant neoplasm of breast: Secondary | ICD-10-CM

## 2012-02-27 DIAGNOSIS — F341 Dysthymic disorder: Secondary | ICD-10-CM

## 2012-02-27 DIAGNOSIS — E039 Hypothyroidism, unspecified: Secondary | ICD-10-CM

## 2012-02-27 DIAGNOSIS — I872 Venous insufficiency (chronic) (peripheral): Secondary | ICD-10-CM

## 2012-02-27 DIAGNOSIS — R5381 Other malaise: Secondary | ICD-10-CM | POA: Diagnosis present

## 2012-02-27 DIAGNOSIS — L02419 Cutaneous abscess of limb, unspecified: Principal | ICD-10-CM | POA: Diagnosis present

## 2012-02-27 DIAGNOSIS — L97909 Non-pressure chronic ulcer of unspecified part of unspecified lower leg with unspecified severity: Secondary | ICD-10-CM | POA: Diagnosis present

## 2012-02-27 DIAGNOSIS — G459 Transient cerebral ischemic attack, unspecified: Secondary | ICD-10-CM

## 2012-02-27 DIAGNOSIS — L97919 Non-pressure chronic ulcer of unspecified part of right lower leg with unspecified severity: Secondary | ICD-10-CM

## 2012-02-27 DIAGNOSIS — C50919 Malignant neoplasm of unspecified site of unspecified female breast: Secondary | ICD-10-CM

## 2012-02-27 DIAGNOSIS — M79606 Pain in leg, unspecified: Secondary | ICD-10-CM | POA: Diagnosis present

## 2012-02-27 DIAGNOSIS — N318 Other neuromuscular dysfunction of bladder: Secondary | ICD-10-CM

## 2012-02-27 DIAGNOSIS — M7989 Other specified soft tissue disorders: Secondary | ICD-10-CM

## 2012-02-27 DIAGNOSIS — M19049 Primary osteoarthritis, unspecified hand: Secondary | ICD-10-CM

## 2012-02-27 DIAGNOSIS — M545 Low back pain: Secondary | ICD-10-CM

## 2012-02-27 DIAGNOSIS — M949 Disorder of cartilage, unspecified: Secondary | ICD-10-CM

## 2012-02-27 DIAGNOSIS — R262 Difficulty in walking, not elsewhere classified: Secondary | ICD-10-CM

## 2012-02-27 DIAGNOSIS — I87009 Postthrombotic syndrome without complications of unspecified extremity: Secondary | ICD-10-CM

## 2012-02-27 DIAGNOSIS — I83009 Varicose veins of unspecified lower extremity with ulcer of unspecified site: Secondary | ICD-10-CM | POA: Diagnosis present

## 2012-02-27 DIAGNOSIS — E876 Hypokalemia: Secondary | ICD-10-CM | POA: Diagnosis present

## 2012-02-27 DIAGNOSIS — N289 Disorder of kidney and ureter, unspecified: Secondary | ICD-10-CM

## 2012-02-27 DIAGNOSIS — I82509 Chronic embolism and thrombosis of unspecified deep veins of unspecified lower extremity: Secondary | ICD-10-CM | POA: Diagnosis present

## 2012-02-27 DIAGNOSIS — I82409 Acute embolism and thrombosis of unspecified deep veins of unspecified lower extremity: Secondary | ICD-10-CM | POA: Diagnosis present

## 2012-02-27 DIAGNOSIS — M79661 Pain in right lower leg: Secondary | ICD-10-CM

## 2012-02-27 DIAGNOSIS — H409 Unspecified glaucoma: Secondary | ICD-10-CM

## 2012-02-27 DIAGNOSIS — E559 Vitamin D deficiency, unspecified: Secondary | ICD-10-CM

## 2012-02-27 DIAGNOSIS — H3582 Retinal ischemia: Secondary | ICD-10-CM

## 2012-02-27 DIAGNOSIS — Z7901 Long term (current) use of anticoagulants: Secondary | ICD-10-CM

## 2012-02-27 DIAGNOSIS — R531 Weakness: Secondary | ICD-10-CM | POA: Diagnosis present

## 2012-02-27 DIAGNOSIS — M79609 Pain in unspecified limb: Secondary | ICD-10-CM

## 2012-02-27 DIAGNOSIS — R413 Other amnesia: Secondary | ICD-10-CM

## 2012-02-27 LAB — URINALYSIS, ROUTINE W REFLEX MICROSCOPIC
Hgb urine dipstick: NEGATIVE
Ketones, ur: NEGATIVE mg/dL
Leukocytes, UA: NEGATIVE
Protein, ur: NEGATIVE mg/dL
Urobilinogen, UA: 0.2 mg/dL (ref 0.0–1.0)

## 2012-02-27 LAB — BASIC METABOLIC PANEL
BUN: 14 mg/dL (ref 6–23)
Calcium: 9.2 mg/dL (ref 8.4–10.5)
Creatinine, Ser: 1.12 mg/dL — ABNORMAL HIGH (ref 0.50–1.10)
GFR calc Af Amer: 50 mL/min — ABNORMAL LOW (ref >90)
GFR calc non Af Amer: 43 mL/min — ABNORMAL LOW (ref >90)

## 2012-02-27 LAB — MAGNESIUM: Magnesium: 2.1 mg/dL (ref 1.5–2.5)

## 2012-02-27 LAB — CBC WITH DIFFERENTIAL/PLATELET
Basophils Absolute: 0 10*3/uL (ref 0.0–0.1)
Basophils Relative: 0 % (ref 0–1)
MCHC: 33.5 g/dL (ref 30.0–36.0)
Neutro Abs: 3.5 10*3/uL (ref 1.7–7.7)
Neutrophils Relative %: 44 % (ref 43–77)
RDW: 15.5 % (ref 11.5–15.5)

## 2012-02-27 LAB — PROTIME-INR: INR: 1.53 — ABNORMAL HIGH (ref 0.00–1.49)

## 2012-02-27 MED ORDER — DIPHENHYDRAMINE HCL 25 MG PO TABS
25.0000 mg | ORAL_TABLET | Freq: Three times a day (TID) | ORAL | Status: DC | PRN
  Filled 2012-02-27: qty 1

## 2012-02-27 MED ORDER — SODIUM CHLORIDE 0.9 % IV SOLN
Freq: Once | INTRAVENOUS | Status: AC
  Administered 2012-02-27: 18:00:00 via INTRAVENOUS

## 2012-02-27 MED ORDER — DARIFENACIN HYDROBROMIDE ER 7.5 MG PO TB24
7.5000 mg | ORAL_TABLET | Freq: Every day | ORAL | Status: DC
  Administered 2012-02-27 – 2012-02-29 (×3): 7.5 mg via ORAL
  Filled 2012-02-27 (×3): qty 1

## 2012-02-27 MED ORDER — IOHEXOL 350 MG/ML SOLN
100.0000 mL | Freq: Once | INTRAVENOUS | Status: AC | PRN
  Administered 2012-02-27: 100 mL via INTRAVENOUS

## 2012-02-27 MED ORDER — WARFARIN SODIUM 6 MG PO TABS
6.0000 mg | ORAL_TABLET | Freq: Once | ORAL | Status: AC
  Administered 2012-02-27: 6 mg via ORAL
  Filled 2012-02-27: qty 1

## 2012-02-27 MED ORDER — BRIMONIDINE TARTRATE 0.15 % OP SOLN
1.0000 [drp] | Freq: Two times a day (BID) | OPHTHALMIC | Status: DC
Start: 2012-02-27 — End: 2012-03-01
  Administered 2012-02-27 – 2012-03-01 (×6): 1 [drp] via OPHTHALMIC
  Filled 2012-02-27: qty 5

## 2012-02-27 MED ORDER — NIFEDIPINE ER 30 MG PO TB24
30.0000 mg | ORAL_TABLET | Freq: Every day | ORAL | Status: DC
  Administered 2012-02-27 – 2012-03-01 (×4): 30 mg via ORAL
  Filled 2012-02-27 (×4): qty 1

## 2012-02-27 MED ORDER — AZITHROMYCIN 500 MG PO TABS
500.0000 mg | ORAL_TABLET | Freq: Every day | ORAL | Status: DC
  Administered 2012-02-28 – 2012-03-01 (×3): 500 mg via ORAL
  Filled 2012-02-27 (×3): qty 1

## 2012-02-27 MED ORDER — ADULT MULTIVITAMIN W/MINERALS CH
1.0000 | ORAL_TABLET | Freq: Every day | ORAL | Status: DC
  Administered 2012-02-27 – 2012-03-01 (×4): 1 via ORAL
  Filled 2012-02-27 (×4): qty 1

## 2012-02-27 MED ORDER — SODIUM CHLORIDE 0.9 % IJ SOLN: 3.0000 mL | INTRAMUSCULAR | Status: DC | PRN

## 2012-02-27 MED ORDER — WARFARIN - PHARMACIST DOSING INPATIENT: Freq: Every day | Status: DC

## 2012-02-27 MED ORDER — ENOXAPARIN SODIUM 80 MG/0.8ML ~~LOC~~ SOLN
80.0000 mg | Freq: Two times a day (BID) | SUBCUTANEOUS | Status: DC
  Administered 2012-02-27 – 2012-03-01 (×6): 80 mg via SUBCUTANEOUS
  Filled 2012-02-27 (×7): qty 0.8

## 2012-02-27 MED ORDER — PANTOPRAZOLE SODIUM 40 MG PO TBEC
40.0000 mg | DELAYED_RELEASE_TABLET | Freq: Every day | ORAL | Status: DC
  Administered 2012-02-28 – 2012-03-01 (×3): 40 mg via ORAL
  Filled 2012-02-27 (×4): qty 1

## 2012-02-27 MED ORDER — FLUTICASONE-SALMETEROL 250-50 MCG/DOSE IN AEPB
1.0000 | INHALATION_SPRAY | Freq: Two times a day (BID) | RESPIRATORY_TRACT | Status: DC
  Administered 2012-02-27 – 2012-03-01 (×6): 1 via RESPIRATORY_TRACT
  Filled 2012-02-27: qty 14

## 2012-02-27 MED ORDER — SODIUM CHLORIDE 0.9 % IJ SOLN
3.0000 mL | Freq: Two times a day (BID) | INTRAMUSCULAR | Status: DC
  Administered 2012-02-29 – 2012-03-01 (×3): 3 mL via INTRAVENOUS

## 2012-02-27 MED ORDER — DORZOLAMIDE HCL-TIMOLOL MAL 2-0.5 % OP SOLN
1.0000 [drp] | Freq: Two times a day (BID) | OPHTHALMIC | Status: DC
  Administered 2012-02-27 – 2012-03-01 (×6): 1 [drp] via OPHTHALMIC
  Filled 2012-02-27: qty 10

## 2012-02-27 MED ORDER — SODIUM CHLORIDE 0.9 % IJ SOLN
3.0000 mL | Freq: Two times a day (BID) | INTRAMUSCULAR | Status: DC
  Administered 2012-02-27 – 2012-02-28 (×3): 3 mL via INTRAVENOUS

## 2012-02-27 MED ORDER — FENTANYL 12 MCG/HR TD PT72
12.5000 ug | MEDICATED_PATCH | TRANSDERMAL | Status: DC
  Administered 2012-02-27: 12.5 ug via TRANSDERMAL
  Filled 2012-02-27: qty 1

## 2012-02-27 MED ORDER — ALBUTEROL SULFATE HFA 108 (90 BASE) MCG/ACT IN AERS
2.0000 | INHALATION_SPRAY | Freq: Four times a day (QID) | RESPIRATORY_TRACT | Status: DC | PRN
  Administered 2012-03-01: 2 via RESPIRATORY_TRACT
  Filled 2012-02-27: qty 6.7

## 2012-02-27 MED ORDER — ONDANSETRON HCL 4 MG/2ML IJ SOLN: 4.0000 mg | Freq: Four times a day (QID) | INTRAMUSCULAR | Status: DC | PRN

## 2012-02-27 MED ORDER — SODIUM CHLORIDE 0.9 % IV SOLN: 250.0000 mL | INTRAVENOUS | Status: DC | PRN

## 2012-02-27 MED ORDER — LATANOPROST 0.005 % OP SOLN
1.0000 [drp] | Freq: Every day | OPHTHALMIC | Status: DC
  Administered 2012-02-27 – 2012-02-29 (×3): 1 [drp] via OPHTHALMIC
  Filled 2012-02-27: qty 2.5

## 2012-02-27 MED ORDER — DOCUSATE SODIUM 100 MG PO CAPS
100.0000 mg | ORAL_CAPSULE | Freq: Two times a day (BID) | ORAL | Status: DC
  Administered 2012-02-27 – 2012-03-01 (×6): 100 mg via ORAL
  Filled 2012-02-27 (×7): qty 1

## 2012-02-27 MED ORDER — LEVOTHYROXINE SODIUM 88 MCG PO TABS
88.0000 ug | ORAL_TABLET | Freq: Every day | ORAL | Status: DC
  Administered 2012-02-27 – 2012-03-01 (×4): 88 ug via ORAL
  Filled 2012-02-27 (×5): qty 1

## 2012-02-27 MED ORDER — LEVETIRACETAM ER 500 MG PO TB24
1000.0000 mg | ORAL_TABLET | Freq: Every day | ORAL | Status: DC
  Administered 2012-02-27 – 2012-02-29 (×3): 1000 mg via ORAL
  Filled 2012-02-27 (×4): qty 2

## 2012-02-27 MED ORDER — ONDANSETRON HCL 4 MG/2ML IJ SOLN: 4.0000 mg | Freq: Three times a day (TID) | INTRAMUSCULAR | Status: DC | PRN

## 2012-02-27 MED ORDER — FLUOCINOLONE ACETONIDE 0.01 % EX OIL: TOPICAL_OIL | Freq: Three times a day (TID) | CUTANEOUS | Status: DC

## 2012-02-27 MED ORDER — PILOCARPINE HCL 1 % OP SOLN
1.0000 [drp] | Freq: Three times a day (TID) | OPHTHALMIC | Status: DC
  Administered 2012-02-27 – 2012-03-01 (×8): 1 [drp] via OPHTHALMIC
  Filled 2012-02-27: qty 15

## 2012-02-27 MED ORDER — TIOTROPIUM BROMIDE MONOHYDRATE 18 MCG IN CAPS
18.0000 ug | ORAL_CAPSULE | Freq: Every day | RESPIRATORY_TRACT | Status: DC
  Administered 2012-02-28 – 2012-03-01 (×3): 18 ug via RESPIRATORY_TRACT
  Filled 2012-02-27: qty 5

## 2012-02-27 MED ORDER — IPRATROPIUM-ALBUTEROL 18-103 MCG/ACT IN AERO
1.0000 | INHALATION_SPRAY | Freq: Four times a day (QID) | RESPIRATORY_TRACT | Status: DC | PRN
  Filled 2012-02-27: qty 14.7

## 2012-02-27 MED ORDER — ONDANSETRON HCL 4 MG PO TABS: 4.0000 mg | ORAL_TABLET | Freq: Four times a day (QID) | ORAL | Status: DC | PRN

## 2012-02-27 MED ORDER — GABAPENTIN 300 MG PO CAPS
900.0000 mg | ORAL_CAPSULE | Freq: Two times a day (BID) | ORAL | Status: DC
  Administered 2012-02-27 – 2012-03-01 (×6): 900 mg via ORAL
  Filled 2012-02-27 (×7): qty 3

## 2012-02-27 MED ORDER — OXYCODONE-ACETAMINOPHEN 5-325 MG PO TABS
1.0000 | ORAL_TABLET | Freq: Three times a day (TID) | ORAL | Status: DC | PRN
  Administered 2012-03-01: 1 via ORAL
  Filled 2012-02-27 (×2): qty 1

## 2012-02-27 NOTE — Progress Notes (Signed)
ANTICOAGULATION CONSULT NOTE - Initial Consult  Pharmacy Consult for Lovenox/Coumadin Indication: History of DVT  Allergies  Allergen Reactions  . Morphine Sulfate     REACTION: rash: ITCHING  . Penicillins     REACTION: urticaria (hives)  . Sertraline Hcl     REACTION: rash    Patient Measurements: Ht: 64 in Wt: 79kg  Vital Signs: Temp: 98.4 F (36.9 C) (09/06 1830) Temp src: Oral (09/06 1524) BP: 153/58 mmHg (09/06 1815) Pulse Rate: 65  (09/06 1815)  Labs:  Basename 02/27/12 1548 02/26/12  HGB 13.0 --  HCT 38.8 --  PLT 204 --  APTT 38* --  LABPROT 18.7* --  INR 1.53* 1.7  HEPARINUNFRC -- --  CREATININE 1.12* --  CKTOTAL -- --  CKMB -- --  TROPONINI <0.30 --   CrCl ~40 ml/min/1.36m2 (normalized)    Medical History: Past Medical History  Diagnosis Date  . Hypertension   . Hypothyroidism   . Seizure disorder   . History of cerebrovascular accident   . Severe stage glaucoma     legally blind  . Depression with anxiety   . OAB (overactive bladder)   . ADENOCARCINOMA, LEFT BREAST 04/11/2010    s/p L mastectomy, on femara x 52yr  . BACK PAIN, LUMBAR, CHRONIC   . DVT 05/2007    RLE following R THR - chronic coumadin, chronic RLE pain  . GLAUCOMA   . OSTEOARTHRITIS, HANDS, BILATERAL   . OSTEOPENIA   . Retinal ischemia   . Sciatica of right side     chronic RLE pain, multifactorial - MRI T/L spine 02/2011  . VITAMIN D DEFICIENCY dx 08/2008  . Venous ulcer of right lower extremity with varicose veins   . Dementia     Medications:  Scheduled:    . sodium chloride   Intravenous Once  . azithromycin  500 mg Oral Daily  . brimonidine  1 drop Both Eyes Q12H  . darifenacin  7.5 mg Oral Daily  . docusate sodium  100 mg Oral BID  . dorzolamide-timolol  1 drop Both Eyes BID  . fentaNYL  12.5 mcg Transdermal Q72H  . fluocinolone   Topical TID  . Fluticasone-Salmeterol  1 puff Inhalation BID  . gabapentin  900 mg Oral BID  . latanoprost  1 drop Both Eyes  QHS  . levETIRAcetam  1,000 mg Oral QHS  . levothyroxine  88 mcg Oral Daily  . multivitamin with minerals  1 tablet Oral Daily  . NIFEdipine  30 mg Oral Daily  . pantoprazole  40 mg Oral Q1200  . pilocarpine  1 drop Both Eyes TID  . sodium chloride  3 mL Intravenous Q12H  . sodium chloride  3 mL Intravenous Q12H  . tiotropium  18 mcg Inhalation Daily    Assessment:  89 YOF admitted with leg pain and TIA workup  Chronic coumadin for hx DVT following THR  Home dose 5mg  daily except 2.5mg  W/F, last dose taken 9/5  INR subtherapeutic (1.53)  Scr mildly elevated, but CrCl > 30 ml/min   Goal of Therapy:  INR 2-3 Monitor platelets by anticoagulation protocol: Yes   Plan:   Begin Lovenox 80mg  (1mg /kg) sq q12h until INR therapeutic  Boost coumadin to 6mg  tonight  Daily PT/INR  Loralee Pacas, PharmD, BCPS Pager: [phone] 02/27/2012,8:15 PM

## 2012-02-27 NOTE — ED Notes (Signed)
Pt returned from CT scan. NAD noted at this time. 

## 2012-02-27 NOTE — ED Notes (Signed)
Admitting physician at bedside

## 2012-02-27 NOTE — ED Provider Notes (Signed)
History     CSN: [id number]  Arrival date & time 02/27/12  1515   First MD Initiated Contact with Patient 02/27/12 1524      Chief Complaint  Patient presents with  . Transient Ischemic Attack    (Consider location/radiation/quality/duration/timing/severity/associated sxs/prior treatment) HPI Comments: 78 year old female with a past medical history significant for hypertension, hypothyroidism, seizure disorder, CVA - aneurysms or glaucoma (legally blind), chronic venous dermatitis/ulcer, chronic kidney disease and DVT (on chronic Coumadin) and several recent antibiotics treatments for lower extremity cellulitis comes in with cc of difficulty in swallowing and confusion. Pt unable to give hx pertaining to the event. Pt's niece at bedside, states that around 11 am, patient had an episode where she was unable to swallow pills, and was mumbling, appeared confused and weak. Pt is more coherent now - and feels like she can swallow ok. No recent falls, no n/v/f/c/chest pain/cough/uti like sx. In addition, patient's family reports that since the last discharge - she is unable to walk, and is already legally blind and is at fall risks.    The history is provided by the patient.    Past Medical History  Diagnosis Date  . Hypertension   . Hypothyroidism   . Seizure disorder   . History of cerebrovascular accident   . Severe stage glaucoma     legally blind  . Depression with anxiety   . OAB (overactive bladder)   . ADENOCARCINOMA, LEFT BREAST 04/11/2010    s/p L mastectomy, on femara x 35yr  . BACK PAIN, LUMBAR, CHRONIC   . DVT 05/2007    RLE following R THR - chronic coumadin, chronic RLE pain  . GLAUCOMA   . OSTEOARTHRITIS, HANDS, BILATERAL   . OSTEOPENIA   . Retinal ischemia   . Sciatica of right side     chronic RLE pain, multifactorial - MRI T/L spine 02/2011  . VITAMIN D DEFICIENCY dx 08/2008  . Venous ulcer of right lower extremity with varicose veins   . Dementia     Past  Surgical History  Procedure Date  . Total abdominal hysterectomy   . Total hip arthroplasty 10/2006    right hip  . Refractive surgery     B/L  . Breast surgery 03/2010    breast biopsy, L mastectomy   . Tonsillectomy     Family History  Problem Relation Age of Onset  . Ovarian cancer Mother   . Cancer Mother   . Deep vein thrombosis Mother   . Heart disease Mother   . Arthritis Other     grandparents  . Lung cancer Other   . Heart disease Other     parent  . Cancer Father     BRAIN TUMOR  . Cancer Brother   . Hyperlipidemia Brother   . Hypertension Brother   . Stroke Brother   . Arthritis Brother   . Diabetes Daughter   . Heart disease Daughter   . Hypertension Daughter   . Hyperlipidemia Daughter   . Other Daughter     VARICOSE VEINS    History  Substance Use Topics  . Smoking status: Former Smoker -- 45 years    Types: Cigarettes    Quit date: 10/16/1979  . Smokeless tobacco: Never Used  . Alcohol Use: No    OB History    Grav Para Term Preterm Abortions TAB SAB Ect Mult Living                  Review  of Systems  Constitutional: Positive for activity change. Negative for fever.  HENT: Negative for facial swelling and neck pain.   Respiratory: Negative for cough, shortness of breath and wheezing.   Cardiovascular: Negative for chest pain.  Gastrointestinal: Negative for nausea, vomiting, abdominal pain, diarrhea, constipation, blood in stool and abdominal distention.  Genitourinary: Negative for hematuria and difficulty urinating.  Musculoskeletal: Positive for myalgias.  Skin: Positive for rash and wound. Negative for color change.  Neurological: Positive for speech difficulty.  Hematological: Does not bruise/bleed easily.  Psychiatric/Behavioral: Negative for confusion.    Allergies  Morphine sulfate; Penicillins; and Sertraline hcl  Home Medications   Current Outpatient Rx  Name Route Sig Dispense Refill  . ALBUTEROL SULFATE HFA 108 (90  BASE) MCG/ACT IN AERS Inhalation Inhale 2 puffs into the lungs every 6 (six) hours as needed. For shortness of breath.    . ALBUTEROL SULFATE IN Inhalation Inhale into the lungs.    Maximino Greenland 18-103 MCG/ACT IN AERO Inhalation Inhale 1-2 puffs into the lungs every 6 (six) hours as needed for wheezing or shortness of breath. 1 Inhaler 11  . AZITHROMYCIN 500 MG PO TABS Oral Take 500 mg by mouth daily.    Marland Kitchen BRIMONIDINE TARTRATE 0.15 % OP SOLN Both Eyes Place 1 drop into both eyes every 12 (twelve) hours.     Marland Kitchen VITAMIN D 1000 UNITS PO TABS Oral Take 1,000 Units by mouth daily.    Marland Kitchen DIPHENHYDRAMINE HCL 25 MG PO TABS Oral Take 25 mg by mouth as needed. Take with oxycodone to reduce itching    . DOCUSATE SODIUM 100 MG PO CAPS Oral Take 100 mg by mouth 2 (two) times daily.     . DORZOLAMIDE HCL-TIMOLOL MAL 22.3-6.8 MG/ML OP SOLN Both Eyes Place 1 drop into both eyes 2 (two) times daily.      . FENTANYL 12 MCG/HR TD PT72 Transdermal Place 1 patch (12.5 mcg total) onto the skin every 3 (three) days. 5 patch 0  . FLUOCINOLONE ACETONIDE 0.01 % EX OIL Topical Apply topically 3 (three) times daily.    Marland Kitchen FLUTICASONE-SALMETEROL 250-50 MCG/DOSE IN AEPB Inhalation Inhale 1 puff into the lungs 2 (two) times daily. 60 each 0  . GABAPENTIN 300 MG PO CAPS Oral Take 900 mg by mouth 2 (two) times daily.    Marland Kitchen LATANOPROST 0.005 % OP SOLN Both Eyes Place 1 drop into both eyes at bedtime.      Marland Kitchen LEVETIRACETAM ER 500 MG PO TB24 Oral Take 1,000 mg by mouth at bedtime.    Marland Kitchen LEVOTHYROXINE SODIUM 88 MCG PO TABS Oral Take 88 mcg by mouth daily.    . ADULT MULTIVITAMIN W/MINERALS CH Oral Take 1 tablet by mouth daily.    Marland Kitchen NIFEDIPINE ER OSMOTIC 30 MG PO TB24 Oral Take 30 mg by mouth daily.    Marland Kitchen OMEPRAZOLE 20 MG PO CPDR Oral Take 20 mg by mouth 2 (two) times daily.    . OXYCODONE-ACETAMINOPHEN 5-325 MG PO TABS Oral Take 1 tablet by mouth every 8 (eight) hours as needed for pain. For pain. 45 tablet 0  . PILOCARPINE HCL  1 % OP SOLN Both Eyes Place 1 drop into both eyes 3 (three) times daily.     Marland Kitchen SOLIFENACIN SUCCINATE 5 MG PO TABS Oral Take 5 mg by mouth daily.    Marland Kitchen TIOTROPIUM BROMIDE MONOHYDRATE 18 MCG IN CAPS Inhalation Place 1 capsule (18 mcg total) into inhaler and inhale daily. 30 capsule 3  .  WARFARIN SODIUM 5 MG PO TABS Oral Take 2.5-5 mg by mouth daily. 1 tab daily except for 0.5 tabs on Wednesdays and Fridays.      BP 167/69  Pulse 85  Temp 99.1 F (37.3 C) (Oral)  Resp 20  SpO2 91%  Physical Exam  Nursing note and vitals reviewed. Constitutional: She is oriented to person, place, and time. She appears well-developed and well-nourished.  HENT:  Head: Normocephalic and atraumatic.  Eyes: EOM are normal. Pupils are equal, round, and reactive to light.  Neck: Neck supple.  Cardiovascular: Normal rate, regular rhythm and normal heart sounds.   No murmur heard. Pulmonary/Chest: Effort normal. No respiratory distress.  Abdominal: Soft. She exhibits no distension. There is no tenderness. There is no rebound and no guarding.  Neurological: She is alert and oriented to person, place, and time. No cranial nerve deficit.       Upper extremity strength is 4+/5 - bilaterally Lower extremity strength - 4+/5 bilaterally but slightly weaker in the LLE. There is wound dressing on the LLE.   Skin: Skin is warm and dry.    ED Course  Procedures (including critical care time)  Labs Reviewed - No data to display No results found.   No diagnosis found.    MDM   Date: 02/27/2012  Rate: 78  Rhythm: normal sinus rhythm  QRS Axis: normal  Intervals: normal  ST/T Wave abnormalities: normal  Conduction Disutrbances:none  Narrative Interpretation:   Old EKG Reviewed: none available   DDx includes:  Stroke - ischemic vs. hemorrhagic TIA Myelitis Electrolyte abnormality Neuropathy Muscular disease Cellulitis Failure to thrive  Pt comes in with cc of some neuro complains -which is now  improved. Concerns for TIA - ABCD2 score is 5. Will admit for TIa workup.  In addition, patient is unable to walk now due to severe pain in the RLE - where she has dermatitic, vs. Cellulitis and has reqd multiple AB. Pt's daughter wants patient to be admitted to SNF temporarily.         Derwood Kaplan, MD 02/27/12 [phone]

## 2012-02-27 NOTE — ED Notes (Signed)
ZOX:[id number]<AV> Expected date:<BR> Expected time:<BR> Means of arrival:<BR> Comments:<BR> ?TIA

## 2012-02-27 NOTE — H&P (Addendum)
[facility] History and Physical  Jennifer Brennan WUJ:[id number] DOB: 06-13-1924 DOA: 02/27/2012  Referring physician: er PCP: [name] Paci, MD   Chief Complaint: leg pain/trouble swallowing  HPI: Jennifer Brennan is a 78 y.o. female History obtained from patient and family at bedside.  Who was recently discharged after being treated for cellulitis.  She last PM was complaining of leg pain- this pain has worsened since discharge.  She has been unable to ambulate due to pain and weakness.  Daughter says that since she was changed from IV to PO abx, her pain and redness had worsened.  Also says there was a new ulcer on the back of her leg.  She was seen in the wound clinic by Dr. Roxan Hockey last Thursday, started on an abx that was changed just yesterday (family could not recall the name).    This AM at 11 she developed an episode of confusion and inability to swallow pills.  Family states she ws mumbling.  She has since passed a swallow eval and is eating in the ER.    Review of Systems: unable to do full ROS as patient has dementia  Past Medical History  Diagnosis Date  . Hypertension   . Hypothyroidism   . Seizure disorder   . History of cerebrovascular accident   . Severe stage glaucoma     legally blind  . Depression with anxiety   . OAB (overactive bladder)   . ADENOCARCINOMA, LEFT BREAST 04/11/2010    s/p L mastectomy, on femara x 40yr  . BACK PAIN, LUMBAR, CHRONIC   . DVT 05/2007    RLE following R THR - chronic coumadin, chronic RLE pain  . GLAUCOMA   . OSTEOARTHRITIS, HANDS, BILATERAL   . OSTEOPENIA   . Retinal ischemia   . Sciatica of right side     chronic RLE pain, multifactorial - MRI T/L spine 02/2011  . VITAMIN D DEFICIENCY dx 08/2008  . Venous ulcer of right lower extremity with varicose veins   . Dementia    Past Surgical History  Procedure Date  . Total abdominal hysterectomy   . Total hip arthroplasty 10/2006    right hip  . Refractive surgery    B/L  . Breast surgery 03/2010    breast biopsy, L mastectomy   . Tonsillectomy    Social History:  reports that she quit smoking about 32 years ago. Her smoking use included Cigarettes. She quit after 45 years of use. She has never used smokeless tobacco. She reports that she does not drink alcohol or use illicit drugs. From home- at last hospitalization she was able to walk but unable to walk now   Allergies  Allergen Reactions  . Morphine Sulfate     REACTION: rash: ITCHING  . Penicillins     REACTION: urticaria (hives)  . Sertraline Hcl     REACTION: rash    Family History  Problem Relation Age of Onset  . Ovarian cancer Mother   . Cancer Mother   . Deep vein thrombosis Mother   . Heart disease Mother   . Arthritis Other     grandparents  . Lung cancer Other   . Heart disease Other     parent  . Cancer Father     BRAIN TUMOR  . Cancer Brother   . Hyperlipidemia Brother   . Hypertension Brother   . Stroke Brother   . Arthritis Brother   . Diabetes Daughter   . Heart disease Daughter   .  Hypertension Daughter   . Hyperlipidemia Daughter   . Other Daughter     VARICOSE VEINS    Prior to Admission medications   Medication Sig Start Date End Date Taking? Authorizing Provider  albuterol (PROVENTIL HFA;VENTOLIN HFA) 108 (90 BASE) MCG/ACT inhaler Inhale 2 puffs into the lungs every 6 (six) hours as needed. For shortness of breath. 01/14/12 01/13/13 Yes Newt Lukes, MD  ALBUTEROL SULFATE IN Inhale into the lungs.   Yes Historical Provider, MD  albuterol-ipratropium (COMBIVENT) 18-103 MCG/ACT inhaler Inhale 1-2 puffs into the lungs every 6 (six) hours as needed for wheezing or shortness of breath. 02/14/12 02/13/13 Yes Alison Murray, MD  azithromycin (ZITHROMAX) 500 MG tablet Take 500 mg by mouth daily.   Yes Historical Provider, MD  brimonidine (ALPHAGAN) 0.15 % ophthalmic solution Place 1 drop into both eyes every 12 (twelve) hours.    Yes Historical Provider, MD    cholecalciferol (VITAMIN D) 1000 UNITS tablet Take 1,000 Units by mouth daily.   Yes Historical Provider, MD  diphenhydrAMINE (BENADRYL) 25 MG tablet Take 25 mg by mouth as needed. Take with oxycodone to reduce itching   Yes Historical Provider, MD  docusate sodium (COLACE) 100 MG capsule Take 100 mg by mouth 2 (two) times daily.    Yes Historical Provider, MD  dorzolamide-timolol (COSOPT) 22.3-6.8 MG/ML ophthalmic solution Place 1 drop into both eyes 2 (two) times daily.     Yes Historical Provider, MD  fentaNYL (DURAGESIC - DOSED MCG/HR) 12 MCG/HR Place 1 patch (12.5 mcg total) onto the skin every 3 (three) days. 02/14/12  Yes Alison Murray, MD  fluocinolone (DERMA-SMOOTHE) 0.01 % external oil Apply topically 3 (three) times daily.   Yes Historical Provider, MD  Fluticasone-Salmeterol (ADVAIR) 250-50 MCG/DOSE AEPB Inhale 1 puff into the lungs 2 (two) times daily. 02/14/12  Yes Alison Murray, MD  gabapentin (NEURONTIN) 300 MG capsule Take 900 mg by mouth 2 (two) times daily.   Yes Historical Provider, MD  latanoprost (XALATAN) 0.005 % ophthalmic solution Place 1 drop into both eyes at bedtime.     Yes Historical Provider, MD  levETIRAcetam (KEPPRA XR) 500 MG 24 hr tablet Take 1,000 mg by mouth at bedtime.   Yes Historical Provider, MD  levothyroxine (SYNTHROID, LEVOTHROID) 88 MCG tablet Take 88 mcg by mouth daily. 10/08/11  Yes Newt Lukes, MD  Multiple Vitamin (MULTIVITAMIN WITH MINERALS) TABS Take 1 tablet by mouth daily.   Yes Historical Provider, MD  NIFEdipine (PROCARDIA-XL/ADALAT-CC/NIFEDICAL-XL) 30 MG 24 hr tablet Take 30 mg by mouth daily.   Yes Historical Provider, MD  omeprazole (PRILOSEC) 20 MG capsule Take 20 mg by mouth 2 (two) times daily.   Yes Historical Provider, MD  oxyCODONE-acetaminophen (PERCOCET/ROXICET) 5-325 MG per tablet Take 1 tablet by mouth every 8 (eight) hours as needed for pain. For pain. 02/14/12  Yes Alison Murray, MD  pilocarpine (PILOCAR) 1 % ophthalmic  solution Place 1 drop into both eyes 3 (three) times daily.  08/17/11  Yes Historical Provider, MD  solifenacin (VESICARE) 5 MG tablet Take 5 mg by mouth daily.   Yes Historical Provider, MD  tiotropium (SPIRIVA) 18 MCG inhalation capsule Place 1 capsule (18 mcg total) into inhaler and inhale daily. 02/14/12 02/13/13 Yes Alison Murray, MD  warfarin (COUMADIN) 5 MG tablet Take 2.5-5 mg by mouth daily. 1 tab daily except for 0.5 tabs on Wednesdays and Fridays.   Yes Historical Provider, MD   Physical Exam: Filed Vitals:  02/27/12 1524 02/27/12 1645 02/27/12 1815 02/27/12 1830  BP: 167/69 137/64 153/58   Pulse: 85 66 65   Temp: 99.1 F (37.3 C)   98.4 F (36.9 C)  TempSrc: Oral     Resp: 20 18 15    SpO2: 91% 96% 95%      General:  Elderly, blind female, NAD, eating subway, mild memory impairment  Eyes: decreased vision  ENT: mucous membranes moist, decreased hearing  Neck: no JVD, no bruit  Cardiovascular: rrr  Respiratory: clear anterior, no wheezing  Abdomen: +Bs, soft, NT/ND  Skin: right leg with venous stasis changes, decreased pulses, two areas of venous ulceration on interior of lower leg, tenderness to palpation  Psychiatric: normal mood and affect  Neurologic: no focal deficits  Labs on Admission:  Basic Metabolic Panel:  Lab 02/27/12 5366  NA 142  K 3.5  CL 105  CO2 29  GLUCOSE 90  BUN 14  CREATININE 1.12*  CALCIUM 9.2  MG 2.1  PHOS 1.9*   Liver Function Tests: No results found for this basename: AST:5,ALT:5,ALKPHOS:5,BILITOT:5,PROT:5,ALBUMIN:5 in the last 168 hours No results found for this basename: LIPASE:5,AMYLASE:5 in the last 168 hours No results found for this basename: AMMONIA:5 in the last 168 hours CBC:  Lab 02/27/12 1548  WBC 8.0  NEUTROABS 3.5  HGB 13.0  HCT 38.8  MCV 81.3  PLT 204   Cardiac Enzymes:  Lab 02/27/12 1548  CKTOTAL --  CKMB --  CKMBINDEX --  TROPONINI <0.30    BNP (last 3 results)  Basename 02/10/12 2215   PROBNP 547.6*   CBG: No results found for this basename: GLUCAP:5 in the last 168 hours  Radiological Exams on Admission: Ct Angio Head W/cm &/or Wo Cm  02/27/2012  *RADIOLOGY REPORT*  Clinical Data:  78 year old female with history of aneurysm. Transient ischemic attack.  CT ANGIOGRAPHY HEAD  Technique:  Multidetector CT imaging of the head was performed using the standard protocol during bolus administration of intravenous contrast.  Multiplanar CT image reconstructions including MIPs were obtained to evaluate the vascular anatomy.  Contrast: OMNIPAQUE IOHEXOL 350 MG/ML SOLN  Comparison:   head CT without contrast 08/19/2011.  Brain MRI 04/30/2009.  Findings:   Visualized orbits and scalp soft tissues are within normal limits.  Visualized paranasal sinuses and mastoids are clear.  No acute osseous abnormality identified.  Chronic bilateral PCA territory encephalomalacia.  Dystrophic calcifications are associated, more so on the right.  The left posterior MCA territory is also affected, up to the superior left parietal lobe.  This is unchanged.  Stable ventriculomegaly. No midline shift, mass effect, or evidence of mass lesion.  No acute intracranial hemorrhage identified.  No evidence of cortically based acute infarction identified.  No abnormal enhancement identified.  Vascular Findings: Major intracranial venous structures are enhancing.    Codominant distal vertebral arteries are patent.  Normal right PICA.  Normal vertebrobasilar junction.  Dominant left AICA. Tortuous basilar artery without stenosis.  SCA and PCA origins are within normal limits.  Posterior communicating arteries are diminutive or absent.  Bilateral PCA branches are within normal limits.  Negative visualized distal cervical ICA.  Moderately calcified bilateral ICA siphons, greater on the right.  Subsequent stenosis is less than 50 % with respect to the distal vessel.  Ophthalmic artery origins and supraclinoid ICA segments are  within normal limits.  Normal carotid termini.  Dominant left ACA A1 segment.  ACA origins are within normal limits.  Diminutive or absent anterior communicating artery.  Bilateral ACA branches are within normal limits.  MCA origins are within normal limits.  Bilateral MCA branches are within normal limits.    Review of the MIP images confirms the above findings.  IMPRESSION: 1.  ICA atherosclerosis and mild basilar artery tortuosity.  No intracranial aneurysm, significant stenosis, or major arterial branch occlusion identified. 2.  Chronic right PCA and left PCA / MCA infarcts are unchanged since 2010. 3. No acute intracranial abnormality.   Original Report Authenticated By: Harley Hallmark, M.D.     EKG: Independently reviewed. Accelerated junctional rhythm    Assessment/Plan Active Problems:  DVT  Chronic anticoagulation  Venous ulcer of right lower extremity with varicose veins  Leg pain  Cellulitis  Weakness generalized   1. TIA- CT scan negative, MRI ordered, stroke work up in progress,  passed nurses bedside swallow eval and is eating in the ER 2. Right leg wound/pain/venous stasis ulceration- h/x of DVT- pain could be from DVT as INR is sub therapeutic.  tx dose lovenox with bridging to therapeutic INR, continue abx ordered by Dr. Roxan Hockey yesterday 3. Weakness generalized- ask PT/OT to see, may need rehab placement   Code Status: full Family Communication: at bedside Disposition Plan: await PT eval  Time spent: > 70 min  Jennifer Brennan [facility] Pager [phone]  If 7PM-7AM, please contact night-coverage [email or web address] Password [facility] 02/27/2012, 7:28 PM

## 2012-02-27 NOTE — ED Notes (Signed)
Per EMS: Pt from home lives with family. At 07:30 this morning family tried to give pt medication and pt could not swallow and had some confusion. Since then symptoms have resolved. Family wanted pt checked out per pcp.

## 2012-02-27 NOTE — ED Notes (Signed)
Family states pt was started on a new antibiotic yesterday and has been having increased swelling to right side of body. Pt reports pain to right lower leg. Leg is wrapped by wound care clinic.

## 2012-02-28 DIAGNOSIS — E039 Hypothyroidism, unspecified: Secondary | ICD-10-CM

## 2012-02-28 DIAGNOSIS — I83009 Varicose veins of unspecified lower extremity with ulcer of unspecified site: Secondary | ICD-10-CM

## 2012-02-28 DIAGNOSIS — R5383 Other fatigue: Secondary | ICD-10-CM

## 2012-02-28 DIAGNOSIS — G459 Transient cerebral ischemic attack, unspecified: Secondary | ICD-10-CM

## 2012-02-28 LAB — CBC
HCT: 36.7 % (ref 36.0–46.0)
Hemoglobin: 12.1 g/dL (ref 12.0–15.0)
MCH: 26.8 pg (ref 26.0–34.0)
MCHC: 33 g/dL (ref 30.0–36.0)
MCV: 81.4 fL (ref 78.0–100.0)
Platelets: 188 10*3/uL (ref 150–400)
RBC: 4.51 MIL/uL (ref 3.87–5.11)
RDW: 15.7 % — ABNORMAL HIGH (ref 11.5–15.5)
WBC: 7.8 10*3/uL (ref 4.0–10.5)

## 2012-02-28 LAB — BASIC METABOLIC PANEL
CO2: 28 mEq/L (ref 19–32)
Chloride: 106 mEq/L (ref 96–112)
GFR calc Af Amer: 51 mL/min — ABNORMAL LOW (ref >90)
Potassium: 3.4 mEq/L — ABNORMAL LOW (ref 3.5–5.1)
Sodium: 142 mEq/L (ref 135–145)

## 2012-02-28 LAB — LIPID PANEL
Cholesterol: 249 mg/dL — ABNORMAL HIGH (ref 0–200)
HDL: 55 mg/dL (ref >39)
LDL Cholesterol: 168 mg/dL — ABNORMAL HIGH (ref 0–99)
Total CHOL/HDL Ratio: 4.5 RATIO
Triglycerides: 130 mg/dL (ref <150)
VLDL: 26 mg/dL (ref 0–40)

## 2012-02-28 LAB — PROTIME-INR: INR: 1.7 — ABNORMAL HIGH (ref 0.00–1.49)

## 2012-02-28 MED ORDER — POTASSIUM CHLORIDE CRYS ER 20 MEQ PO TBCR
40.0000 meq | EXTENDED_RELEASE_TABLET | Freq: Once | ORAL | Status: DC
  Filled 2012-02-28: qty 2

## 2012-02-28 MED ORDER — WARFARIN SODIUM 6 MG PO TABS
6.0000 mg | ORAL_TABLET | Freq: Once | ORAL | Status: AC
  Administered 2012-02-28: 6 mg via ORAL
  Filled 2012-02-28: qty 1

## 2012-02-28 NOTE — Evaluation (Signed)
Physical Therapy Evaluation Patient Details Name: Jennifer Brennan MRN: [id number] DOB: 11/19/1923 Today's Date: 02/28/2012 Time: 4098-1191 PT Time Calculation (min): 22 min  PT Assessment / Plan / Recommendation Clinical Impression  Pt presents with chronic anticoagulation with open wound on medial surface of RLE.  Tolerated OOB to chair, however pt with increased weakness compared to last episode of care, requirng +2 assist to complete transfer.  Pt will benefit from skilled PT in acute venue to address deficits.  PT recommends SNF for follow up therapy following D/C in order to increase pt safety and decrease burden of care.     PT Assessment  Patient needs continued PT services    Follow Up Recommendations  Skilled nursing facility    Barriers to Discharge Decreased caregiver support      Equipment Recommendations  Defer to next venue    Recommendations for Other Services     Frequency Min 3X/week    Precautions / Restrictions Precautions Precautions: Fall Precaution Comments: blind and wound on R distal LE Restrictions Weight Bearing Restrictions: No   Pertinent Vitals/Pain Pt with pain in RLE, unable to state number, but states pain wasn't "too bad" when up moving.       Mobility  Bed Mobility Bed Mobility: Supine to Sit Supine to Sit: 1: +2 Total assist Supine to Sit: Patient Percentage: 50% Details for Bed Mobility Assistance: Assist for BLEs off of bed and for trunk to attain sitting position. Cues for technique, however pt unable to assist very much due to weakness and pt is blind.  Transfers Transfers: Sit to Stand;Stand to Sit;Stand Pivot Transfers Sit to Stand: 1: +2 Total assist;From elevated surface;With upper extremity assist;From bed Sit to Stand: Patient Percentage: 50% Stand to Sit: 1: +2 Total assist;With upper extremity assist;With armrests;To chair/3-in-1 Stand to Sit: Patient Percentage: 50% Stand Pivot Transfers: 1: +2 Total assist Stand Pivot  Transfers: Patient Percentage: 50% Details for Transfer Assistance: Assist to rise and maintain upright position throughout mobility with manual cuing for hand placement on RW and to chair and verbal and manual cuing for sequencing/technique with RW to perform stand pivot transfer from bed to chair.  Ambulation/Gait Ambulation/Gait Assistance: Not tested (comment) Stairs: No Wheelchair Mobility Wheelchair Mobility: No    Exercises     PT Diagnosis: Difficulty walking;Generalized weakness;Acute pain  PT Problem List: Decreased strength;Decreased range of motion;Decreased activity tolerance;Decreased balance;Decreased mobility;Decreased knowledge of use of DME;Decreased safety awareness;Decreased skin integrity;Pain PT Treatment Interventions: DME instruction;Gait training;Functional mobility training;Therapeutic activities;Therapeutic exercise;Balance training;Patient/family education   PT Goals Acute Rehab PT Goals PT Goal Formulation: With patient Time For Goal Achievement: 03/13/12 Potential to Achieve Goals: Fair Pt will go Supine/Side to Sit: with min assist PT Goal: Supine/Side to Sit - Progress: Goal set today Pt will go Sit to Supine/Side: with min assist PT Goal: Sit to Supine/Side - Progress: Goal set today Pt will go Sit to Stand: with min assist PT Goal: Sit to Stand - Progress: Goal set today Pt will Transfer Bed to Chair/Chair to Bed: with min assist PT Transfer Goal: Bed to Chair/Chair to Bed - Progress: Goal set today Pt will Ambulate: 16 - 50 feet;with min assist;with least restrictive assistive device PT Goal: Ambulate - Progress: Goal set today  Visit Information  Last PT Received On: 02/28/12 Assistance Needed: +2 PT/OT Co-Evaluation/Treatment: Yes    Subjective Data  Subjective: It was ok  Patient Stated Goal: n/a   Prior Functioning  Home Living Lives With: Spouse Available Help  at Discharge: Skilled Nursing Facility;Other (Comment) (family reports no  longer able to assist) Type of Home: House Home Access: Stairs to enter [employer] of Steps: 5 Entrance Stairs-Rails: Left Home Layout: Two level;Able to live on main level with bedroom/bathroom Bathroom Shower/Tub: Health visitor: Handicapped height Home Adaptive Equipment: Shower chair without back;Grab bars in shower;Walker - rolling;Straight cane;Quad cane;Bedside commode/3-in-1 Prior Function Level of Independence: Needs assistance Needs Assistance: Bathing;Dressing;Meal Prep;Light Housekeeping;Feeding;Grooming;Toileting (levels based on last few days PTA) Bath: Total Dressing: Total Feeding: Total Grooming: Maximal Toileting: Total Meal Prep: Total Light Housekeeping: Total Driving: No Vocation: Retired Comments: Daughter reports pt was receiving HH PT but hadnt started OT yet. Reports pt using RW but could barely get up to a chair and right before admission couldnt do that because of the pain/edema in R foot. Reports pt having trouble even feeding herself and grooming. Was requiring more assist with ADL compared to last admission in Aug this year.  Communication Communication: No difficulties Dominant Hand: Right    Cognition  Overall Cognitive Status: History of cognitive impairments - at baseline Arousal/Alertness: Awake/alert Orientation Level: Appears intact for tasks assessed Behavior During Session: [facility] for tasks performed    Extremity/Trunk Assessment Right Upper Extremity Assessment RUE ROM/Strength/Tone: Deficits RUE ROM/Strength/Tone Deficits: didnt formally test but pt able to use UEs to help but very weak Left Upper Extremity Assessment LUE ROM/Strength/Tone: Deficits LUE ROM/Strength/Tone Deficits: didnt formally test but pt able to use UEs to help but very weak Right Lower Extremity Assessment RLE ROM/Strength/Tone: Unable to fully assess;Due to pain RLE ROM/Strength/Tone Deficits: Generalized weakness, however pt with open  wound on RLE causing increased pain Left Lower Extremity Assessment LLE ROM/Strength/Tone: Deficits LLE ROM/Strength/Tone Deficits: Generalized weakness grossly 3/5 per functional assessment.  Trunk Assessment Trunk Assessment: Normal   Balance    End of Session PT - End of Session Equipment Utilized During Treatment: Gait belt Activity Tolerance: Patient limited by fatigue Patient left: in chair;with call bell/phone within reach;with family/visitor present Nurse Communication: Mobility status  GP     Page, Meribeth Mattes 02/28/2012, 1:50 PM

## 2012-02-28 NOTE — Progress Notes (Signed)
ANTICOAGULATION CONSULT NOTE - Initial Consult  Pharmacy Consult for Lovenox/Coumadin Indication: History of DVT  Allergies  Allergen Reactions  . Morphine Sulfate     REACTION: rash: ITCHING  . Penicillins     REACTION: urticaria (hives)  . Sertraline Hcl     REACTION: rash    Patient Measurements: Ht: 64 in Wt: 79kg  Vital Signs: Temp: 98.1 F (36.7 C) (09/07 0544) Temp src: Oral (09/07 0544) BP: 147/67 mmHg (09/07 0544) Pulse Rate: 59  (09/07 0544)  Labs:  Basename 02/28/12 0515 02/27/12 1548 02/26/12  HGB 12.1 13.0 --  HCT 36.7 38.8 --  PLT 188 204 --  APTT -- 38* --  LABPROT 20.3* 18.7* --  INR 1.70* 1.53* 1.7  HEPARINUNFRC -- -- --  CREATININE 1.11* 1.12* --  CKTOTAL -- -- --  CKMB -- -- --  TROPONINI -- <0.30 --   CrCl ~40 ml/min/1.25m2 (normalized)    Medical History: Past Medical History  Diagnosis Date  . Hypertension   . Hypothyroidism   . Seizure disorder   . History of cerebrovascular accident   . Severe stage glaucoma     legally blind  . Depression with anxiety   . OAB (overactive bladder)   . ADENOCARCINOMA, LEFT BREAST 04/11/2010    s/p L mastectomy, on femara x 96yr  . BACK PAIN, LUMBAR, CHRONIC   . DVT 05/2007    RLE following R THR - chronic coumadin, chronic RLE pain  . GLAUCOMA   . OSTEOARTHRITIS, HANDS, BILATERAL   . OSTEOPENIA   . Retinal ischemia   . Sciatica of right side     chronic RLE pain, multifactorial - MRI T/L spine 02/2011  . VITAMIN D DEFICIENCY dx 08/2008  . Venous ulcer of right lower extremity with varicose veins   . Dementia     Medications:  Scheduled:     . sodium chloride   Intravenous Once  . azithromycin  500 mg Oral Daily  . brimonidine  1 drop Both Eyes Q12H  . darifenacin  7.5 mg Oral Daily  . docusate sodium  100 mg Oral BID  . dorzolamide-timolol  1 drop Both Eyes BID  . enoxaparin (LOVENOX) injection  80 mg Subcutaneous Q12H  . fentaNYL  12.5 mcg Transdermal Q72H  .  Fluticasone-Salmeterol  1 puff Inhalation BID  . gabapentin  900 mg Oral BID  . latanoprost  1 drop Both Eyes QHS  . levETIRAcetam  1,000 mg Oral QHS  . levothyroxine  88 mcg Oral Daily  . multivitamin with minerals  1 tablet Oral Daily  . NIFEdipine  30 mg Oral Daily  . pantoprazole  40 mg Oral Q1200  . pilocarpine  1 drop Both Eyes TID  . sodium chloride  3 mL Intravenous Q12H  . sodium chloride  3 mL Intravenous Q12H  . tiotropium  18 mcg Inhalation Daily  . warfarin  6 mg Oral Once  . Warfarin - Pharmacist Dosing Inpatient   Does not apply q1800  . DISCONTD: fluocinolone   Topical TID    Assessment:  65 YOF admitted with leg pain and TIA workup  Chronic coumadin for hx DVT following THR  Home dose 5mg  daily except 2.5mg  W/F, INR subtherapeutic on admission.  Lovenox bridging.  INR still subtherapeutic but rising after 6mg  yesterday  Scr mildly elevated, but CrCl > 30 ml/min No bleeding reported/documented. Venous ulcers on inner right calf noted.  CBC stable.  Azithromycin can increase INR response.  Goal of Therapy:  INR 2-3 Monitor platelets by anticoagulation protocol: Yes   Plan:   Cont Lovenox 80mg (1mg /kg) sq q12h until INR therapeutic  Repeat Coumadin to 6mg  tonight  Daily PT/INR  Charolotte Eke, PharmD, pager [phone]. 02/28/2012,8:40 AM.

## 2012-02-28 NOTE — Evaluation (Signed)
Occupational Therapy Evaluation Patient Details Name: Jennifer Brennan MRN: [id number] DOB: [birth year] Today's Date: 02/28/2012 Time: 8295-6213 OT Time Calculation (min): 20 min  OT Assessment / Plan / Recommendation Clinical Impression  Pt was recently discharged after being treated for cellulitis.  She was admitted for leg pain and has been unable to ambulate due to pain and weakness. Pt will benefit from skilled OT services to improve functional mobiity and ADL.     OT Assessment  Patient needs continued OT Services    Follow Up Recommendations  Skilled nursing facility    Barriers to Discharge      Equipment Recommendations  None recommended by OT    Recommendations for Other Services    Frequency  Min 1X/week    Precautions / Restrictions Precautions Precautions: Fall Precaution Comments: blind and wound on R distal LE Restrictions Weight Bearing Restrictions: No        ADL  Eating/Feeding: Other (comment) (not assessed) Grooming: Simulated;Wash/dry face;Minimal assistance Where Assessed - Grooming: Supine, head of bed up Upper Body Bathing: Simulated;Chest;Right arm;Left arm;Abdomen;Maximal assistance Where Assessed - Upper Body Bathing: Unsupported sitting Lower Body Bathing: +2 Total assistance Lower Body Bathing: Patient Percentage: 20% Where Assessed - Lower Body Bathing: Supported sit to stand Upper Body Dressing: Simulated;Maximal assistance Where Assessed - Upper Body Dressing: Unsupported sitting Lower Body Dressing: Simulated;+2 Total assistance Lower Body Dressing: Patient Percentage: 0% Where Assessed - Lower Body Dressing: Supported sit to stand Toilet Transfer: Simulated;+2 Total assistance Toilet Transfer: Patient Percentage: 50% Statistician Method: Stand pivot Toileting - Architect and Hygiene: Simulated;+2 Total assistance Toileting - Architect and Hygiene: Patient Percentage: 0% Where Assessed - Glass blower/designer  Manipulation and Hygiene: Standing Tub/Shower Transfer Method: Not assessed Equipment Used: Rolling walker ADL Comments: Daughter present and supportive but spouse repeatedly questioning why therapy is working with pt and why it would be beneficial for pt to be OOB. Explained purpose of therapy and husband finally agreeable to let therapy get pt to chair.Pt much weaker compared to last admission less than one month ago.     OT Diagnosis: Generalized weakness  OT Problem List: Decreased strength;Decreased activity tolerance;Decreased knowledge of use of DME or AE OT Treatment Interventions: Self-care/ADL training;Therapeutic activities;DME and/or AE instruction;Patient/family education   OT Goals Acute Rehab OT Goals OT Goal Formulation: With patient/family Time For Goal Achievement: 03/13/12 Potential to Achieve Goals: Good ADL Goals Pt Will Perform Grooming: with supervision;Sitting, edge of bed;Sitting, chair;Unsupported ADL Goal: Grooming - Progress: Goal set today Pt Will Transfer to Toilet: with max assist;Stand pivot transfer;with DME;3-in-1 ADL Goal: Toilet Transfer - Progress: Goal set today Pt Will Perform Toileting - Clothing Manipulation: with max assist;Standing ADL Goal: Toileting - Clothing Manipulation - Progress: Goal set today Additional ADL Goal #1: Pt will transfer to EOB in prep for ADL with max assist.  ADL Goal: Additional Goal #1 - Progress: Goal set today  Visit Information  Last OT Received On: 02/28/12 Assistance Needed: +2 PT/OT Co-Evaluation/Treatment: Yes    Subjective Data  Subjective: pt doesnt verbalize much. but agreed to get out of bed Patient Stated Goal: as above   Prior Functioning  Vision/Perception  Home Living Lives With: Spouse Available Help at Discharge: Skilled Nursing Facility;Other (Comment) (family reports no longer able to assist) Type of Home: House Home Access: Stairs to enter [employer] of Steps: 5 Entrance  Stairs-Rails: Left Home Layout: Two level;Able to live on main level with bedroom/bathroom Bathroom Shower/Tub: Health visitor: Handicapped  height Home Adaptive Equipment: Shower chair without back;Grab bars in shower;Walker - rolling;Straight cane;Quad cane;Bedside commode/3-in-1 Prior Function Level of Independence: Needs assistance Needs Assistance: Bathing;Dressing;Meal Prep;Light Housekeeping;Feeding;Grooming;Toileting (levels based on last few days PTA) Bath: Total Dressing: Total Feeding: Total Grooming: Maximal Toileting: Total Meal Prep: Total Light Housekeeping: Total Driving: No Vocation: Retired Comments: Daughter reports pt was receiving HH PT but hadnt started OT yet. Reports pt using RW but could barely get up to a chair and right before admission couldnt do that because of the pain/edema in R foot. Reports pt having trouble even feeding herself and grooming. Was requiring more assist with ADL compared to last admission in Aug this year.  Communication Communication: No difficulties Dominant Hand: Right      Cognition  Overall Cognitive Status: History of cognitive impairments - at baseline Arousal/Alertness: Awake/alert Orientation Level: Appears intact for tasks assessed Behavior During Session: [facility] for tasks performed    Extremity/Trunk Assessment Right Upper Extremity Assessment RUE ROM/Strength/Tone: Deficits RUE ROM/Strength/Tone Deficits: didnt formally test but pt able to use UEs to help but very weak Left Upper Extremity Assessment LUE ROM/Strength/Tone: Deficits LUE ROM/Strength/Tone Deficits: didnt formally test but pt able to use UEs to help but very weak   Mobility  Shoulder Instructions  Bed Mobility Bed Mobility: Supine to Sit Supine to Sit: 1: +2 Total assist;HOB elevated Supine to Sit: Patient Percentage: 50% Transfers Transfers: Sit to Stand;Stand to Sit Sit to Stand: 1: +2 Total assist;From bed Sit to Stand: Patient  Percentage: 50% Stand to Sit: 1: +2 Total assist;To chair/3-in-1 Stand to Sit: Patient Percentage: 50% Details for Transfer Assistance: pt assisted to put UEs onto walker and required +2 assist to rise to standing. Required assist for hand placement on chair to help descend and assist to control descent.        Exercise     Balance     End of Session OT - End of Session Equipment Utilized During Treatment: Gait belt Activity Tolerance: Other (comment) (weakness) Patient left: in chair;with call bell/phone within reach;with family/visitor present  GO     Lennox Laity [phone] 02/28/2012, 1:19 PM

## 2012-02-28 NOTE — Progress Notes (Signed)
[facility] PROGRESS NOTE  Jennifer Brennan JYN:[id number] DOB: [birth year] DOA: 02/27/2012 PCP: [name] Paci, MD  Assessment/Plan: Active Problems:  DVT  Chronic anticoagulation  Venous ulcer of right lower extremity with varicose veins  Leg pain  Cellulitis  Weakness generalized  Probable TIA - MRI neg for acute findings -pt had echo a couple of weeks echo - will not repeat, follow pending studies and await PT eval DVT -continue anticoagulation, INR remains subtherapeutic Venous ulcer of right lower extremity with varicose veins - await wound care eval for further recs  Leg pain - continue pain mangement  Cellulitis -continue current abx  Weakness generalized -await PT eval Hypokalemia -replace k  Code Status: full Family Communication:family at bedside Disposition Plan: pending PT eval   Brief narrative: Pt is an 78 yo recently discharged after being treated for cellulitis. readmitted with complaints of leg pain- this pain has worsened since discharge.Also says there was a new ulcer on the back of her leg. She has been unable to ambulate due to pain and weakness Also on am of admission,she developed an episode of confusion and inability to swallow pills. Family states she ws mumbling. She has since passed a swallow eval and is eating in the ER.    Consultants:  none  Procedures:  MRI   Antibiotics:  Zithromax  HPI/Subjective: Pt alert and oriented x2, denies any new c/o, family at bedside  Objective: Filed Vitals:   02/27/12 2055 02/28/12 0544 02/28/12 0915 02/28/12 1400  BP:  147/67  128/72  Pulse:  59  62  Temp:  98.1 F (36.7 C)  98.6 F (37 C)  TempSrc:  Oral  Oral  Resp: 18 20  16   Height:      Weight:      SpO2:  93% 94% 94%    Intake/Output Summary (Last 24 hours) at 02/28/12 1551 Last data filed at 02/28/12 1416  Gross per 24 hour  Intake 650.08 ml  Output   2700 ml  Net -2049.92 ml   Filed Weights   02/27/12 2014    Weight: 76.5 kg (168 lb 10.4 oz)    Exam:   General:  Elderly female in NAD, A&O x 3  Cardiovascular: RRR Respiratory: clear anterior, no wheezing  Abdomen: +Bs, soft, NT/ND  Skin: right leg with venous stasis changes/hyperpigmentation, decreased pulses, two areas of venous ulceration on noted on lower leg.  Data Reviewed: Basic Metabolic Panel:  Lab 02/28/12 8657 02/27/12 1548  NA 142 142  K 3.4* 3.5  CL 106 105  CO2 28 29  GLUCOSE 100* 90  BUN 11 14  CREATININE 1.11* 1.12*  CALCIUM 9.0 9.2  MG -- 2.1  PHOS -- 1.9*   Liver Function Tests: No results found for this basename: AST:5,ALT:5,ALKPHOS:5,BILITOT:5,PROT:5,ALBUMIN:5 in the last 168 hours No results found for this basename: LIPASE:5,AMYLASE:5 in the last 168 hours No results found for this basename: AMMONIA:5 in the last 168 hours CBC:  Lab 02/28/12 0515 02/27/12 1548  WBC 7.8 8.0  NEUTROABS -- 3.5  HGB 12.1 13.0  HCT 36.7 38.8  MCV 81.4 81.3  PLT 188 204   Cardiac Enzymes:  Lab 02/27/12 1548  CKTOTAL --  CKMB --  CKMBINDEX --  TROPONINI <0.30   BNP (last 3 results)  Basename 02/10/12 2215  PROBNP 547.6*   CBG: No results found for this basename: GLUCAP:5 in the last 168 hours  No results found for this or any previous visit (from the past 240 hour(s)).  Studies: Ct Angio Head W/cm &/or Wo Cm  02/27/2012  *RADIOLOGY REPORT*  Clinical Data:  78 year old female with history of aneurysm. Transient ischemic attack.  CT ANGIOGRAPHY HEAD  Technique:  Multidetector CT imaging of the head was performed using the standard protocol during bolus administration of intravenous contrast.  Multiplanar CT image reconstructions including MIPs were obtained to evaluate the vascular anatomy.  Contrast: OMNIPAQUE IOHEXOL 350 MG/ML SOLN  Comparison:   head CT without contrast 08/19/2011.  Brain MRI 04/30/2009.  Findings:   Visualized orbits and scalp soft tissues are within normal limits.  Visualized paranasal  sinuses and mastoids are clear.  No acute osseous abnormality identified.  Chronic bilateral PCA territory encephalomalacia.  Dystrophic calcifications are associated, more so on the right.  The left posterior MCA territory is also affected, up to the superior left parietal lobe.  This is unchanged.  Stable ventriculomegaly. No midline shift, mass effect, or evidence of mass lesion.  No acute intracranial hemorrhage identified.  No evidence of cortically based acute infarction identified.  No abnormal enhancement identified.  Vascular Findings: Major intracranial venous structures are enhancing.    Codominant distal vertebral arteries are patent.  Normal right PICA.  Normal vertebrobasilar junction.  Dominant left AICA. Tortuous basilar artery without stenosis.  SCA and PCA origins are within normal limits.  Posterior communicating arteries are diminutive or absent.  Bilateral PCA branches are within normal limits.  Negative visualized distal cervical ICA.  Moderately calcified bilateral ICA siphons, greater on the right.  Subsequent stenosis is less than 50 % with respect to the distal vessel.  Ophthalmic artery origins and supraclinoid ICA segments are within normal limits.  Normal carotid termini.  Dominant left ACA A1 segment.  ACA origins are within normal limits.  Diminutive or absent anterior communicating artery.  Bilateral ACA branches are within normal limits.  MCA origins are within normal limits.  Bilateral MCA branches are within normal limits.    Review of the MIP images confirms the above findings.  IMPRESSION: 1.  ICA atherosclerosis and mild basilar artery tortuosity.  No intracranial aneurysm, significant stenosis, or major arterial branch occlusion identified. 2.  Chronic right PCA and left PCA / MCA infarcts are unchanged since 2010. 3. No acute intracranial abnormality.   Original Report Authenticated By: Harley Hallmark, M.D.    Mri Brain Without Contrast  02/28/2012  *RADIOLOGY REPORT*   Clinical Data: 78 year old female with weakness.  History of previous stroke.  MRI HEAD WITHOUT CONTRAST  Technique:  Multiplanar, multiecho pulse sequences of the brain and surrounding structures were obtained according to standard protocol without intravenous contrast.  Comparison: Head CTA from earlier the same day.  Brain MRI 04/30/2009.  Findings: No restricted diffusion to suggest acute infarction. Chronic left parietal lobe and bilateral occipital lobe infarct with encephalomalacia and gliosis is unchanged since 2010.  Interval mild additional enlargement of the ventricles.  No evidence of transependymal edema.  The temporal horns remain relatively gracile.  Major intracranial vascular flow voids are stable. No midline shift, mass effect, or evidence of mass lesion.  Chronic blood products associated with the bilateral remote infarcts.  There is a mild degree of superficial siderosis over the bilateral superior hemispheres.  Occasional other chronic micro hemorrhages in the brain.  No new signal abnormality.  Stable visualized cervical spine with degenerative anterolisthesis of C3 on C4 contributing to a degree of spinal stenosis.  Negative pituitary. Visualized orbit soft tissues are within normal limits. Minimal paranasal sinus mucosal  thickening.  Minimal fluid in the left mastoids.  Negative nasopharynx.  Negative scalp soft tissues.  IMPRESSION: 1. No acute intracranial abnormality. 2.  Bilateral occipital lobe and left parietal lobe chronic hemorrhagic infarcts. 3.  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 4.  Mild ventricular enlargement since 2010.   Original Report Authenticated By: Harley Hallmark, M.D.     Scheduled Meds:   . sodium chloride   Intravenous Once  . azithromycin  500 mg Oral Daily  . brimonidine  1 drop Both Eyes Q12H  . darifenacin  7.5 mg Oral Daily  . docusate sodium  100 mg Oral BID  . dorzolamide-timolol  1 drop Both Eyes BID  .  enoxaparin (LOVENOX) injection  80 mg Subcutaneous Q12H  . fentaNYL  12.5 mcg Transdermal Q72H  . Fluticasone-Salmeterol  1 puff Inhalation BID  . gabapentin  900 mg Oral BID  . latanoprost  1 drop Both Eyes QHS  . levETIRAcetam  1,000 mg Oral QHS  . levothyroxine  88 mcg Oral Daily  . multivitamin with minerals  1 tablet Oral Daily  . NIFEdipine  30 mg Oral Daily  . pantoprazole  40 mg Oral Q1200  . pilocarpine  1 drop Both Eyes TID  . sodium chloride  3 mL Intravenous Q12H  . sodium chloride  3 mL Intravenous Q12H  . tiotropium  18 mcg Inhalation Daily  . warfarin  6 mg Oral Once  . warfarin  6 mg Oral ONCE-1800  . Warfarin - Pharmacist Dosing Inpatient   Does not apply q1800  . DISCONTD: fluocinolone   Topical TID   Continuous Infusions:   Active Problems:  DVT  Chronic anticoagulation  Venous ulcer of right lower extremity with varicose veins  Leg pain  Cellulitis  Weakness generalized    Time spent:    [facility]  [facility] Pager [phone]. If 8PM-8AM, please contact night-coverage at [email or web address], password [facility] 02/28/2012, 3:51 PM  LOS: 1 day

## 2012-02-28 NOTE — Progress Notes (Signed)
VASCULAR LAB PRELIMINARY  PRELIMINARY  PRELIMINARY  PRELIMINARY  Carotid Dopplers completed.    Preliminary report:  There is no ICA stenosis.  Vertebral artery flow is antegrade.  Jennifer Brennan, 02/28/2012, 10:44 AM

## 2012-02-28 NOTE — Progress Notes (Signed)
I have assessed and documented Jennifer Brennan's venous ulcers, which are located on her inner right lower calf.  It is not possible to completley visualize the extent of the wounds because of a layer of hard white paste which was placed before this admission.  At this time there are two visible open wounds which are surrounded by dark brown discolored skin.  I placed an abdominal binder over the wound site, now awaiting wound care orders.  Will continue to monitor.

## 2012-02-29 LAB — BASIC METABOLIC PANEL
BUN: 16 mg/dL (ref 6–23)
Chloride: 105 mEq/L (ref 96–112)
Creatinine, Ser: 1.15 mg/dL — ABNORMAL HIGH (ref 0.50–1.10)
GFR calc Af Amer: 48 mL/min — ABNORMAL LOW (ref >90)
GFR calc non Af Amer: 42 mL/min — ABNORMAL LOW (ref >90)
Potassium: 3.5 mEq/L (ref 3.5–5.1)

## 2012-02-29 LAB — PROTIME-INR
INR: 1.83 — ABNORMAL HIGH (ref 0.00–1.49)
Prothrombin Time: 21.5 seconds — ABNORMAL HIGH (ref 11.6–15.2)

## 2012-02-29 MED ORDER — WARFARIN SODIUM 6 MG PO TABS
6.0000 mg | ORAL_TABLET | Freq: Once | ORAL | Status: AC
  Administered 2012-02-29: 6 mg via ORAL
  Filled 2012-02-29: qty 1

## 2012-02-29 NOTE — Progress Notes (Signed)
[facility] PROGRESS NOTE  CHASITTY HEHL ZOX:[id number] DOB: [birth year] DOA: 02/27/2012 PCP: [name] Paci, MD  Assessment/Plan: Active Problems:  DVT  Chronic anticoagulation  Venous ulcer of right lower extremity with varicose veins  Leg pain  Cellulitis  Weakness generalized  Probable TIA - MRI neg for acute findings -pt had echo a couple of weeks echo - will not repeat, follow  - PT recommending snf, SW oto assist with placement DVT -continue anticoagulation, INR trending up,remains subtherapeutic Venous ulcer of right lower extremity with varicose veins - await wound care eval for further recs  Leg pain - continue pain mangement  Cellulitis -continue current abx  Weakness generalized -await PT eval Hypokalemia -resolved  Code Status: full Family Communication:family at bedside Disposition Plan: pending PT eval   Brief narrative: Pt is an 78 yo recently discharged after being treated for cellulitis. readmitted with complaints of leg pain- this pain has worsened since discharge.Also says there was a new ulcer on the back of her leg. She has been unable to ambulate due to pain and weakness Also on am of admission,she developed an episode of confusion and inability to swallow pills. Family states she ws mumbling. She has since passed a swallow eval and is eating in the ER.    Consultants:  none  Procedures:  MRI   Antibiotics:  Zithromax  HPI/Subjective: Pt states foot pain better, swallowing without difficulty.denies any new c/o  Objective: Filed Vitals:   02/28/12 2041 02/28/12 2159 02/29/12 0600 02/29/12 1216  BP:  118/67 132/73   Pulse:  73 63   Temp:  97.3 F (36.3 C) 97.2 F (36.2 C)   TempSrc:  Oral Oral   Resp:  18 17   Height:      Weight:      SpO2: 96% 96% 93% 94%    Intake/Output Summary (Last 24 hours) at 02/29/12 1523 Last data filed at 02/28/12 2300  Gross per 24 hour  Intake    360 ml  Output    450 ml  Net    -90  ml   Filed Weights   02/27/12 2014  Weight: 76.5 kg (168 lb 10.4 oz)    Exam:   General:  Elderly female in NAD, A&O x 3, she is blind Cardiovascular: RRR Respiratory: clear anterior, no wheezing  Abdomen: +Bs, soft, NT/ND  Skin: right leg with venous stasis changes/hyperpigmentation, decreased pulses, two areas of venous ulceration on noted on lower leg.  Data Reviewed: Basic Metabolic Panel:  Lab 02/29/12 8119 02/28/12 0515 02/27/12 1548  NA 140 142 142  K 3.5 3.4* 3.5  CL 105 106 105  CO2 27 28 29   GLUCOSE 94 100* 90  BUN 16 11 14   CREATININE 1.15* 1.11* 1.12*  CALCIUM 8.8 9.0 9.2  MG -- -- 2.1  PHOS -- -- 1.9*   Liver Function Tests: No results found for this basename: AST:5,ALT:5,ALKPHOS:5,BILITOT:5,PROT:5,ALBUMIN:5 in the last 168 hours No results found for this basename: LIPASE:5,AMYLASE:5 in the last 168 hours No results found for this basename: AMMONIA:5 in the last 168 hours CBC:  Lab 02/28/12 0515 02/27/12 1548  WBC 7.8 8.0  NEUTROABS -- 3.5  HGB 12.1 13.0  HCT 36.7 38.8  MCV 81.4 81.3  PLT 188 204   Cardiac Enzymes:  Lab 02/27/12 1548  CKTOTAL --  CKMB --  CKMBINDEX --  TROPONINI <0.30   BNP (last 3 results)  Basename 02/10/12 2215  PROBNP 547.6*   CBG: No results found for this  basename: GLUCAP:5 in the last 168 hours  No results found for this or any previous visit (from the past 240 hour(s)).   Studies: Ct Angio Head W/cm &/or Wo Cm  02/27/2012  *RADIOLOGY REPORT*  Clinical Data:  78 year old female with history of aneurysm. Transient ischemic attack.  CT ANGIOGRAPHY HEAD  Technique:  Multidetector CT imaging of the head was performed using the standard protocol during bolus administration of intravenous contrast.  Multiplanar CT image reconstructions including MIPs were obtained to evaluate the vascular anatomy.  Contrast: OMNIPAQUE IOHEXOL 350 MG/ML SOLN  Comparison:   head CT without contrast 08/19/2011.  Brain MRI 04/30/2009.   Findings:   Visualized orbits and scalp soft tissues are within normal limits.  Visualized paranasal sinuses and mastoids are clear.  No acute osseous abnormality identified.  Chronic bilateral PCA territory encephalomalacia.  Dystrophic calcifications are associated, more so on the right.  The left posterior MCA territory is also affected, up to the superior left parietal lobe.  This is unchanged.  Stable ventriculomegaly. No midline shift, mass effect, or evidence of mass lesion.  No acute intracranial hemorrhage identified.  No evidence of cortically based acute infarction identified.  No abnormal enhancement identified.  Vascular Findings: Major intracranial venous structures are enhancing.    Codominant distal vertebral arteries are patent.  Normal right PICA.  Normal vertebrobasilar junction.  Dominant left AICA. Tortuous basilar artery without stenosis.  SCA and PCA origins are within normal limits.  Posterior communicating arteries are diminutive or absent.  Bilateral PCA branches are within normal limits.  Negative visualized distal cervical ICA.  Moderately calcified bilateral ICA siphons, greater on the right.  Subsequent stenosis is less than 50 % with respect to the distal vessel.  Ophthalmic artery origins and supraclinoid ICA segments are within normal limits.  Normal carotid termini.  Dominant left ACA A1 segment.  ACA origins are within normal limits.  Diminutive or absent anterior communicating artery.  Bilateral ACA branches are within normal limits.  MCA origins are within normal limits.  Bilateral MCA branches are within normal limits.    Review of the MIP images confirms the above findings.  IMPRESSION: 1.  ICA atherosclerosis and mild basilar artery tortuosity.  No intracranial aneurysm, significant stenosis, or major arterial branch occlusion identified. 2.  Chronic right PCA and left PCA / MCA infarcts are unchanged since 2010. 3. No acute intracranial abnormality.   Original Report  Authenticated By: Harley Hallmark, M.D.    Mri Brain Without Contrast  02/28/2012  *RADIOLOGY REPORT*  Clinical Data: 78 year old female with weakness.  History of previous stroke.  MRI HEAD WITHOUT CONTRAST  Technique:  Multiplanar, multiecho pulse sequences of the brain and surrounding structures were obtained according to standard protocol without intravenous contrast.  Comparison: Head CTA from earlier the same day.  Brain MRI 04/30/2009.  Findings: No restricted diffusion to suggest acute infarction. Chronic left parietal lobe and bilateral occipital lobe infarct with encephalomalacia and gliosis is unchanged since 2010.  Interval mild additional enlargement of the ventricles.  No evidence of transependymal edema.  The temporal horns remain relatively gracile.  Major intracranial vascular flow voids are stable. No midline shift, mass effect, or evidence of mass lesion.  Chronic blood products associated with the bilateral remote infarcts.  There is a mild degree of superficial siderosis over the bilateral superior hemispheres.  Occasional other chronic micro hemorrhages in the brain.  No new signal abnormality.  Stable visualized cervical spine with degenerative anterolisthesis of C3  on C4 contributing to a degree of spinal stenosis.  Negative pituitary. Visualized orbit soft tissues are within normal limits. Minimal paranasal sinus mucosal thickening.  Minimal fluid in the left mastoids.  Negative nasopharynx.  Negative scalp soft tissues.  IMPRESSION: 1. No acute intracranial abnormality. 2.  Bilateral occipital lobe and left parietal lobe chronic hemorrhagic infarcts. 3.  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 4.  Mild ventricular enlargement since 2010.   Original Report Authenticated By: Harley Hallmark, M.D.     Scheduled Meds:    . azithromycin  500 mg Oral Daily  . brimonidine  1 drop Both Eyes Q12H  . darifenacin  7.5 mg Oral Daily  . docusate sodium  100  mg Oral BID  . dorzolamide-timolol  1 drop Both Eyes BID  . enoxaparin (LOVENOX) injection  80 mg Subcutaneous Q12H  . fentaNYL  12.5 mcg Transdermal Q72H  . Fluticasone-Salmeterol  1 puff Inhalation BID  . gabapentin  900 mg Oral BID  . latanoprost  1 drop Both Eyes QHS  . levETIRAcetam  1,000 mg Oral QHS  . levothyroxine  88 mcg Oral Daily  . multivitamin with minerals  1 tablet Oral Daily  . NIFEdipine  30 mg Oral Daily  . pantoprazole  40 mg Oral Q1200  . pilocarpine  1 drop Both Eyes TID  . potassium chloride  40 mEq Oral Once  . sodium chloride  3 mL Intravenous Q12H  . sodium chloride  3 mL Intravenous Q12H  . tiotropium  18 mcg Inhalation Daily  . warfarin  6 mg Oral ONCE-1800  . warfarin  6 mg Oral ONCE-1800  . Warfarin - Pharmacist Dosing Inpatient   Does not apply q1800   Continuous Infusions:   Active Problems:  DVT  Chronic anticoagulation  Venous ulcer of right lower extremity with varicose veins  Leg pain  Cellulitis  Weakness generalized    Time spent:    Kela Millin  [facility] Pager [phone]. If 8PM-8AM, please contact night-coverage at [email or web address], password [facility] 02/29/2012, 3:23 PM  LOS: 2 days

## 2012-02-29 NOTE — Progress Notes (Signed)
Pt family concerned about side effects of enablex- hallucinations and facial swelling and request that med be discontinued. Talked with md on call and med dicontinued.

## 2012-02-29 NOTE — Progress Notes (Signed)
Clinical Social Work Department CLINICAL SOCIAL WORK PLACEMENT NOTE 02/29/2012  Patient:  Jennifer Brennan, Jennifer Brennan  Account Number:  [id number] Admit date:  02/27/2012  Clinical Social Worker:  Leron Croak, CLINICAL SOCIAL WORKER  Date/time:  02/29/2012 06:13 PM  Clinical Social Work is seeking post-discharge placement for this patient at the following level of care:   SKILLED NURSING   (*CSW will update this form in Epic as items are completed)   02/29/2012  Patient/family provided with [facility] Department of Clinical Social Work's list of facilities offering this level of care within the geographic area requested by the patient (or if unable, by the patient's family).  02/29/2012  Patient/family informed of their freedom to choose among providers that offer the needed level of care, that participate in Medicare, Medicaid or managed care program needed by the patient, have an available bed and are willing to accept the patient.  02/29/2012  Patient/family informed of [facility]' ownership interest in [facility], as well as of the fact that they are under no obligation to receive care at this facility.  PASARR submitted to EDS on  PASARR number received from EDS on   FL2 transmitted to all facilities in geographic area requested by pt/family on  02/29/2012 FL2 transmitted to all facilities within larger geographic area on 02/29/2012  Patient informed that his/her managed care company has contracts with or will negotiate with  certain facilities, including the following:     Patient/family informed of bed offers received:   Patient chooses bed at  Physician recommends and patient chooses bed at    Patient to be transferred to  on   Patient to be transferred to facility by   The following physician request were entered in Epic:   Additional Comments: 1. [facility] 2. (New skilled on [address]) but could not find one??  Leron Croak, [employer] Coverage [phone]

## 2012-02-29 NOTE — Progress Notes (Signed)
Clinical Social Work Department BRIEF PSYCHOSOCIAL ASSESSMENT 02/29/2012  Patient:  Jennifer Brennan, Jennifer Brennan     Account Number:  [id number]     Admit date:  02/27/2012  Clinical Social Worker:  Leron Croak, CLINICAL SOCIAL WORKER  Date/Time:  02/29/2012 05:49 PM  Referred by:  Physician  Date Referred:  02/29/2012 Referred for  SNF Placement   Other Referral:   Interview type:  Patient Other interview type:   daughters and husband were also present.    PSYCHOSOCIAL DATA Living Status:  HUSBAND Admitted from facility:   Level of care:   Primary support name:  Torrie Mayers Primary support relationship to patient:  CHILD, ADULT Degree of support available:   good    CURRENT CONCERNS Current Concerns  Post-Acute Placement   Other Concerns:    SOCIAL WORK ASSESSMENT / PLAN CSW met with Pt and family at the bedside. Pt was awaking from sleep and was not initially involved with the conversation. Husband also showed shortly after the assessment began which caused husband to feel confused as to the purpose of the CSW visit. Husband seemed to have difficulty with Pt needing SNF placement. Pt was asked for permission to send out information. Pt did give consent to search but has not fully given consent for placement.   Assessment/plan status:  Information/Referral to [facility] Other assessment/ plan:   Information/referral to community resources:   CSW provided [employer] [facility] listing    PATIENT'S/FAMILY'S RESPONSE TO PLAN OF CARE: Daughters were appreciative for assistance.       Leron Croak, LCSWA [employer] Coverage [phone]

## 2012-02-29 NOTE — Progress Notes (Signed)
ANTICOAGULATION CONSULT NOTE - Follow up  Pharmacy Consult for Lovenox/Coumadin Indication: History of DVT  Allergies  Allergen Reactions  . Morphine Sulfate     REACTION: rash: ITCHING  . Penicillins     REACTION: urticaria (hives)  . Sertraline Hcl     REACTION: rash    Patient Measurements: Ht: 64 in Wt: 79kg  Vital Signs: Temp: 97.3 F (36.3 C) (09/07 2159) Temp src: Oral (09/07 2159) BP: 118/67 mmHg (09/07 2159) Pulse Rate: 73  (09/07 2159)  Labs:  Basename 02/29/12 0433 02/28/12 0515 02/27/12 1548  HGB -- 12.1 13.0  HCT -- 36.7 38.8  PLT -- 188 204  APTT -- -- 38*  LABPROT 21.5* 20.3* 18.7*  INR 1.83* 1.70* 1.53*  HEPARINUNFRC -- -- --  CREATININE 1.15* 1.11* 1.12*  CKTOTAL -- -- --  CKMB -- -- --  TROPONINI -- -- <0.30   CrCl ~40 ml/min/1.35m2 (normalized)    Medical History: Past Medical History  Diagnosis Date  . Hypertension   . Hypothyroidism   . Seizure disorder   . History of cerebrovascular accident   . Severe stage glaucoma     legally blind  . Depression with anxiety   . OAB (overactive bladder)   . ADENOCARCINOMA, LEFT BREAST 04/11/2010    s/p L mastectomy, on femara x 58yr  . BACK PAIN, LUMBAR, CHRONIC   . DVT 05/2007    RLE following R THR - chronic coumadin, chronic RLE pain  . GLAUCOMA   . OSTEOARTHRITIS, HANDS, BILATERAL   . OSTEOPENIA   . Retinal ischemia   . Sciatica of right side     chronic RLE pain, multifactorial - MRI T/L spine 02/2011  . VITAMIN D DEFICIENCY dx 08/2008  . Venous ulcer of right lower extremity with varicose veins   . Dementia     Medications:  Scheduled:     . azithromycin  500 mg Oral Daily  . brimonidine  1 drop Both Eyes Q12H  . darifenacin  7.5 mg Oral Daily  . docusate sodium  100 mg Oral BID  . dorzolamide-timolol  1 drop Both Eyes BID  . enoxaparin (LOVENOX) injection  80 mg Subcutaneous Q12H  . fentaNYL  12.5 mcg Transdermal Q72H  . Fluticasone-Salmeterol  1 puff Inhalation BID  .  gabapentin  900 mg Oral BID  . latanoprost  1 drop Both Eyes QHS  . levETIRAcetam  1,000 mg Oral QHS  . levothyroxine  88 mcg Oral Daily  . multivitamin with minerals  1 tablet Oral Daily  . NIFEdipine  30 mg Oral Daily  . pantoprazole  40 mg Oral Q1200  . pilocarpine  1 drop Both Eyes TID  . potassium chloride  40 mEq Oral Once  . sodium chloride  3 mL Intravenous Q12H  . sodium chloride  3 mL Intravenous Q12H  . tiotropium  18 mcg Inhalation Daily  . warfarin  6 mg Oral ONCE-1800  . Warfarin - Pharmacist Dosing Inpatient   Does not apply q1800    Assessment:  74 YOF admitted with leg pain and TIA workup.  Chronic Coumadin for hx DVT following THR.  Home dose 5mg  daily except 2.5mg  W/F, INR subtherapeutic on admission.  Lovenox bridging.  INR subtherapeutic but rising nicely after 6mg  x 2 days.  Scr mildly elevated, but CrCl > 30 ml/min No bleeding reported/documented. Venous ulcers on inner right calf noted.  CBC stable as of 9/7.  Azithromycin can increase INR response.  Goal of Therapy:  INR 2-3 Monitor platelets by anticoagulation protocol: Yes   Plan:   Cont Lovenox 80mg (1mg /kg) sq q12h until INR therapeutic.  Repeat Coumadin to 6mg  tonight.  Daily PT/INR.  Charolotte Eke, PharmD, pager [phone]. 02/29/2012,6:42 AM.

## 2012-03-01 ENCOUNTER — Encounter (HOSPITAL_COMMUNITY): Payer: Self-pay | Admitting: Emergency Medicine

## 2012-03-01 ENCOUNTER — Emergency Department (HOSPITAL_COMMUNITY): Payer: Medicare Other

## 2012-03-01 ENCOUNTER — Other Ambulatory Visit: Payer: Self-pay

## 2012-03-01 ENCOUNTER — Inpatient Hospital Stay (HOSPITAL_COMMUNITY)
Admission: EM | Admit: 2012-03-01 | Discharge: 2012-03-04 | DRG: 072 | Disposition: A | Discharge status: Skilled Nursing Facility | Payer: Medicare Other | Attending: Internal Medicine | Admitting: Internal Medicine

## 2012-03-01 ENCOUNTER — Encounter: Payer: Medicare Other | Admitting: Physical Medicine and Rehabilitation

## 2012-03-01 DIAGNOSIS — Y9210 Unspecified residential institution as the place of occurrence of the external cause: Secondary | ICD-10-CM | POA: Diagnosis present

## 2012-03-01 DIAGNOSIS — N289 Disorder of kidney and ureter, unspecified: Secondary | ICD-10-CM

## 2012-03-01 DIAGNOSIS — Z9089 Acquired absence of other organs: Secondary | ICD-10-CM

## 2012-03-01 DIAGNOSIS — I10 Essential (primary) hypertension: Secondary | ICD-10-CM

## 2012-03-01 DIAGNOSIS — I639 Cerebral infarction, unspecified: Secondary | ICD-10-CM

## 2012-03-01 DIAGNOSIS — Z90710 Acquired absence of both cervix and uterus: Secondary | ICD-10-CM

## 2012-03-01 DIAGNOSIS — M79661 Pain in right lower leg: Secondary | ICD-10-CM

## 2012-03-01 DIAGNOSIS — Z96649 Presence of unspecified artificial hip joint: Secondary | ICD-10-CM

## 2012-03-01 DIAGNOSIS — M899 Disorder of bone, unspecified: Secondary | ICD-10-CM

## 2012-03-01 DIAGNOSIS — Z1231 Encounter for screening mammogram for malignant neoplasm of breast: Secondary | ICD-10-CM

## 2012-03-01 DIAGNOSIS — I129 Hypertensive chronic kidney disease with stage 1 through stage 4 chronic kidney disease, or unspecified chronic kidney disease: Secondary | ICD-10-CM | POA: Diagnosis present

## 2012-03-01 DIAGNOSIS — E559 Vitamin D deficiency, unspecified: Secondary | ICD-10-CM

## 2012-03-01 DIAGNOSIS — Z79899 Other long term (current) drug therapy: Secondary | ICD-10-CM

## 2012-03-01 DIAGNOSIS — Z7901 Long term (current) use of anticoagulants: Secondary | ICD-10-CM

## 2012-03-01 DIAGNOSIS — M7989 Other specified soft tissue disorders: Secondary | ICD-10-CM

## 2012-03-01 DIAGNOSIS — N189 Chronic kidney disease, unspecified: Secondary | ICD-10-CM | POA: Diagnosis present

## 2012-03-01 DIAGNOSIS — I69998 Other sequelae following unspecified cerebrovascular disease: Secondary | ICD-10-CM

## 2012-03-01 DIAGNOSIS — R413 Other amnesia: Secondary | ICD-10-CM

## 2012-03-01 DIAGNOSIS — C50919 Malignant neoplasm of unspecified site of unspecified female breast: Secondary | ICD-10-CM

## 2012-03-01 DIAGNOSIS — K219 Gastro-esophageal reflux disease without esophagitis: Secondary | ICD-10-CM

## 2012-03-01 DIAGNOSIS — M5430 Sciatica, unspecified side: Secondary | ICD-10-CM | POA: Diagnosis present

## 2012-03-01 DIAGNOSIS — G9340 Encephalopathy, unspecified: Principal | ICD-10-CM

## 2012-03-01 DIAGNOSIS — R569 Unspecified convulsions: Secondary | ICD-10-CM

## 2012-03-01 DIAGNOSIS — I87009 Postthrombotic syndrome without complications of unspecified extremity: Secondary | ICD-10-CM

## 2012-03-01 DIAGNOSIS — J449 Chronic obstructive pulmonary disease, unspecified: Secondary | ICD-10-CM | POA: Diagnosis present

## 2012-03-01 DIAGNOSIS — G40909 Epilepsy, unspecified, not intractable, without status epilepticus: Secondary | ICD-10-CM | POA: Diagnosis present

## 2012-03-01 DIAGNOSIS — Z853 Personal history of malignant neoplasm of breast: Secondary | ICD-10-CM

## 2012-03-01 DIAGNOSIS — F329 Major depressive disorder, single episode, unspecified: Secondary | ICD-10-CM | POA: Diagnosis present

## 2012-03-01 DIAGNOSIS — F3289 Other specified depressive episodes: Secondary | ICD-10-CM | POA: Diagnosis present

## 2012-03-01 DIAGNOSIS — L0291 Cutaneous abscess, unspecified: Secondary | ICD-10-CM

## 2012-03-01 DIAGNOSIS — L0390 Cellulitis, unspecified: Secondary | ICD-10-CM

## 2012-03-01 DIAGNOSIS — Z86718 Personal history of other venous thrombosis and embolism: Secondary | ICD-10-CM

## 2012-03-01 DIAGNOSIS — I872 Venous insufficiency (chronic) (peripheral): Secondary | ICD-10-CM

## 2012-03-01 DIAGNOSIS — J4489 Other specified chronic obstructive pulmonary disease: Secondary | ICD-10-CM | POA: Diagnosis present

## 2012-03-01 DIAGNOSIS — I82409 Acute embolism and thrombosis of unspecified deep veins of unspecified lower extremity: Secondary | ICD-10-CM

## 2012-03-01 DIAGNOSIS — I739 Peripheral vascular disease, unspecified: Secondary | ICD-10-CM

## 2012-03-01 DIAGNOSIS — Z9010 Acquired absence of unspecified breast and nipple: Secondary | ICD-10-CM

## 2012-03-01 DIAGNOSIS — M542 Cervicalgia: Secondary | ICD-10-CM

## 2012-03-01 DIAGNOSIS — F341 Dysthymic disorder: Secondary | ICD-10-CM

## 2012-03-01 DIAGNOSIS — M545 Low back pain, unspecified: Secondary | ICD-10-CM

## 2012-03-01 DIAGNOSIS — N318 Other neuromuscular dysfunction of bladder: Secondary | ICD-10-CM

## 2012-03-01 DIAGNOSIS — F0390 Unspecified dementia without behavioral disturbance: Secondary | ICD-10-CM | POA: Diagnosis present

## 2012-03-01 DIAGNOSIS — Z888 Allergy status to other drugs, medicaments and biological substances status: Secondary | ICD-10-CM

## 2012-03-01 DIAGNOSIS — M949 Disorder of cartilage, unspecified: Secondary | ICD-10-CM

## 2012-03-01 DIAGNOSIS — R4182 Altered mental status, unspecified: Secondary | ICD-10-CM

## 2012-03-01 DIAGNOSIS — E039 Hypothyroidism, unspecified: Secondary | ICD-10-CM

## 2012-03-01 DIAGNOSIS — H409 Unspecified glaucoma: Secondary | ICD-10-CM

## 2012-03-01 DIAGNOSIS — Z87891 Personal history of nicotine dependence: Secondary | ICD-10-CM

## 2012-03-01 DIAGNOSIS — R531 Weakness: Secondary | ICD-10-CM

## 2012-03-01 DIAGNOSIS — F411 Generalized anxiety disorder: Secondary | ICD-10-CM | POA: Diagnosis present

## 2012-03-01 DIAGNOSIS — L97919 Non-pressure chronic ulcer of unspecified part of right lower leg with unspecified severity: Secondary | ICD-10-CM | POA: Diagnosis present

## 2012-03-01 DIAGNOSIS — H3582 Retinal ischemia: Secondary | ICD-10-CM

## 2012-03-01 DIAGNOSIS — M19049 Primary osteoarthritis, unspecified hand: Secondary | ICD-10-CM

## 2012-03-01 DIAGNOSIS — T4275XA Adverse effect of unspecified antiepileptic and sedative-hypnotic drugs, initial encounter: Secondary | ICD-10-CM | POA: Diagnosis present

## 2012-03-01 DIAGNOSIS — I83009 Varicose veins of unspecified lower extremity with ulcer of unspecified site: Secondary | ICD-10-CM | POA: Diagnosis present

## 2012-03-01 DIAGNOSIS — M79606 Pain in leg, unspecified: Secondary | ICD-10-CM

## 2012-03-01 DIAGNOSIS — H543 Unqualified visual loss, both eyes: Secondary | ICD-10-CM | POA: Diagnosis present

## 2012-03-01 DIAGNOSIS — I83019 Varicose veins of right lower extremity with ulcer of unspecified site: Secondary | ICD-10-CM

## 2012-03-01 DIAGNOSIS — Z880 Allergy status to penicillin: Secondary | ICD-10-CM

## 2012-03-01 DIAGNOSIS — Z885 Allergy status to narcotic agent status: Secondary | ICD-10-CM

## 2012-03-01 DIAGNOSIS — G8929 Other chronic pain: Secondary | ICD-10-CM | POA: Diagnosis present

## 2012-03-01 LAB — PROTIME-INR: Prothrombin Time: 22.5 seconds — ABNORMAL HIGH (ref 11.6–15.2)

## 2012-03-01 MED ORDER — OXYCODONE-ACETAMINOPHEN 5-325 MG PO TABS: 1.0000 | ORAL_TABLET | Freq: Three times a day (TID) | ORAL | Status: DC | PRN

## 2012-03-01 MED ORDER — WARFARIN SODIUM 5 MG PO TABS: 2.5000 mg | ORAL_TABLET | Freq: Every day | ORAL | Status: DC

## 2012-03-01 MED ORDER — CYCLOBENZAPRINE HCL 10 MG PO TABS
10.0000 mg | ORAL_TABLET | Freq: Once | ORAL | Status: AC
  Administered 2012-03-01: 10 mg via ORAL
  Filled 2012-03-01: qty 1

## 2012-03-01 MED ORDER — AZITHROMYCIN 500 MG PO TABS: 500.0000 mg | ORAL_TABLET | Freq: Every day | ORAL | Status: DC

## 2012-03-01 MED ORDER — WARFARIN SODIUM 6 MG PO TABS
6.0000 mg | ORAL_TABLET | Freq: Once | ORAL | Status: DC
  Filled 2012-03-01: qty 1

## 2012-03-01 NOTE — Progress Notes (Signed)
Physical Therapy Treatment Patient Details Name: Jennifer Brennan MRN: [id number] DOB: 28-May-1924 Today's Date: 03/01/2012 Time: 7829-5621 PT Time Calculation (min): 26 min  PT Assessment / Plan / Recommendation Comments on Treatment Session  Pt doing somewhat better with sit to stand and transfer, however is still requiing +2 for safety and to complete all mobility.  Continue to recommend SNF for follow up.     Follow Up Recommendations  Skilled nursing facility    Barriers to Discharge        Equipment Recommendations  Defer to next venue    Recommendations for Other Services    Frequency Min 3X/week   Plan Discharge plan remains appropriate    Precautions / Restrictions Precautions Precautions: Fall Precaution Comments: blind and wound on R distal LE Restrictions Weight Bearing Restrictions: No   Pertinent Vitals/Pain Some pain in RLE, however no number stated.     Mobility  Bed Mobility Bed Mobility: Supine to Sit Supine to Sit: 1: +2 Total assist Supine to Sit: Patient Percentage: 20% Details for Bed Mobility Assistance: Assist for B LEs off of bed and for trunk to attain sitting position.  Verbal and manual cuing for hand placement and technique.  Also used pad on bed to assist hips.  Transfers Transfers: Sit to Stand;Stand to Sit;Stand Pivot Transfers Sit to Stand: 1: +2 Total assist;From elevated surface;With upper extremity assist;From bed Sit to Stand: Patient Percentage: 50% Stand to Sit: 1: +2 Total assist;With upper extremity assist;With armrests;To chair/3-in-1 Stand to Sit: Patient Percentage: 50% Stand Pivot Transfers: 1: +2 Total assist Stand Pivot Transfers: Patient Percentage: 60% Details for Transfer Assistance: Assist to rise and maintain upright posture throughout with verbal and manual cuing for hand placement when sitting/standing and also for weight shifting and LE advancement when performing transfer.  Ambulation/Gait Ambulation/Gait Assistance:  Not tested (comment) Assistive device: Rolling walker Stairs: No Wheelchair Mobility Wheelchair Mobility: No    Exercises     PT Diagnosis:    PT Problem List:   PT Treatment Interventions:     PT Goals Acute Rehab PT Goals PT Goal Formulation: With patient Time For Goal Achievement: 03/13/12 Potential to Achieve Goals: Fair Pt will go Supine/Side to Sit: with min assist PT Goal: Supine/Side to Sit - Progress: Progressing toward goal Pt will go Sit to Stand: with min assist PT Goal: Sit to Stand - Progress: Progressing toward goal Pt will Transfer Bed to Chair/Chair to Bed: with min assist PT Transfer Goal: Bed to Chair/Chair to Bed - Progress: Progressing toward goal  Visit Information  Last PT Received On: 03/01/12 Assistance Needed: +2    Subjective Data  Subjective: I feel good Patient Stated Goal: n/a   Cognition  Overall Cognitive Status: History of cognitive impairments - at baseline Arousal/Alertness: Awake/alert Orientation Level: Appears intact for tasks assessed Behavior During Session: [facility] for tasks performed    Balance  Dynamic Sitting Balance Dynamic Sitting - Balance Support: Left upper extremity supported;Feet supported Dynamic Sitting - Level of Assistance: 5: Stand by assistance;4: Min assist Dynamic Sitting - Comments: Pt able to sit EOB x approx 10-12 mins to perform grooming/hair care at intermittent min to supervision for occassional posterior leaning. Able to correct with cues.   End of Session PT - End of Session Equipment Utilized During Treatment: Gait belt Activity Tolerance: Patient limited by fatigue;Other (comment) (SOB) Patient left: in chair;with call bell/phone within reach;with family/visitor present   GP     Page, Meribeth Mattes 03/01/2012, 12:37 PM

## 2012-03-01 NOTE — Progress Notes (Signed)
ANTICOAGULATION CONSULT NOTE - Follow up  Pharmacy Consult for Lovenox/Coumadin Indication: History of DVT  Allergies  Allergen Reactions  . Morphine Sulfate     REACTION: rash: ITCHING  . Penicillins     REACTION: urticaria (hives)  . Sertraline Hcl     REACTION: rash    Patient Measurements: Ht: 64 in Wt: 79kg  Vital Signs: Temp: 98.1 F (36.7 C) (09/09 0436) Temp src: Oral (09/09 0436) BP: 135/71 mmHg (09/09 0436) Pulse Rate: 62  (09/09 0436)  Labs:  Basename 03/01/12 0425 02/29/12 0433 02/28/12 0515 02/27/12 1548  HGB -- -- 12.1 13.0  HCT -- -- 36.7 38.8  PLT -- -- 188 204  APTT -- -- -- 38*  LABPROT 22.5* 21.5* 20.3* --  INR 1.94* 1.83* 1.70* --  HEPARINUNFRC -- -- -- --  CREATININE -- 1.15* 1.11* 1.12*  CKTOTAL -- -- -- --  CKMB -- -- -- --  TROPONINI -- -- -- <0.30   CrCl ~40 ml/min/1.81m2 (normalized)    Medical History: Past Medical History  Diagnosis Date  . Hypertension   . Hypothyroidism   . Seizure disorder   . History of cerebrovascular accident   . Severe stage glaucoma     legally blind  . Depression with anxiety   . OAB (overactive bladder)   . ADENOCARCINOMA, LEFT BREAST 04/11/2010    s/p L mastectomy, on femara x 55yr  . BACK PAIN, LUMBAR, CHRONIC   . DVT 05/2007    RLE following R THR - chronic coumadin, chronic RLE pain  . GLAUCOMA   . OSTEOARTHRITIS, HANDS, BILATERAL   . OSTEOPENIA   . Retinal ischemia   . Sciatica of right side     chronic RLE pain, multifactorial - MRI T/L spine 02/2011  . VITAMIN D DEFICIENCY dx 08/2008  . Venous ulcer of right lower extremity with varicose veins   . Dementia     Medications:  Scheduled:     . azithromycin  500 mg Oral Daily  . brimonidine  1 drop Both Eyes Q12H  . cyclobenzaprine  10 mg Oral Once  . docusate sodium  100 mg Oral BID  . dorzolamide-timolol  1 drop Both Eyes BID  . enoxaparin (LOVENOX) injection  80 mg Subcutaneous Q12H  . fentaNYL  12.5 mcg Transdermal Q72H  .  Fluticasone-Salmeterol  1 puff Inhalation BID  . gabapentin  900 mg Oral BID  . latanoprost  1 drop Both Eyes QHS  . levETIRAcetam  1,000 mg Oral QHS  . levothyroxine  88 mcg Oral Daily  . multivitamin with minerals  1 tablet Oral Daily  . NIFEdipine  30 mg Oral Daily  . pantoprazole  40 mg Oral Q1200  . pilocarpine  1 drop Both Eyes TID  . potassium chloride  40 mEq Oral Once  . sodium chloride  3 mL Intravenous Q12H  . sodium chloride  3 mL Intravenous Q12H  . tiotropium  18 mcg Inhalation Daily  . warfarin  6 mg Oral ONCE-1800  . Warfarin - Pharmacist Dosing Inpatient   Does not apply q1800  . DISCONTD: darifenacin  7.5 mg Oral Daily    Assessment:  34 YOF admitted with leg pain and TIA workup.  Chronic Coumadin for hx DVT following THR.  Home dose 5mg  daily except 2.5mg  W/F, INR subtherapeutic on admission.  Lovenox bridging - 1mg /kg q12h.  INR subtherapeutic but rising nicely after 6mg  x 3 days.  Scr mildly elevated, but CrCl > 30 ml/min No bleeding  reported/documented. Venous ulcers on inner right calf noted.  CBC stable as of 9/7.  Azithromycin can increase INR response.  Goal of Therapy:  INR 2-3 Monitor platelets by anticoagulation protocol: Yes   Plan:   Cont Lovenox 80mg (1mg /kg) sq q12h until INR therapeutic.  Repeat Coumadin to 6mg  tonight.  Daily PT/INR.  Darrol Angel, PharmD Pager: [phone] 03/01/2012 9:59 AM .

## 2012-03-01 NOTE — Discharge Summary (Signed)
Physician Discharge Summary  Jennifer Brennan UJW:[id number] DOB: 1924-04-26 DOA: 02/27/2012  PCP: [name] Paci, MD  Admit date: 02/27/2012 Discharge date: 03/01/2012  Recommendations for Outpatient Follow-up:  M.D. in one to 2 days-  FOLLOW UP lab, PT/INR  in a.m. 9/10  AND  Also follow up on wound culture done at wound care center on 9/5 Discharge Orders    Future Appointments: Provider: Department: Dept Phone: Center:   03/01/2012 3:30 PM Su Monks, PA-C [facility] [phone] [facility]   03/15/2012 2:30 PM Newt Lukes, MD [facility] [phone] [facility]   03/26/2012 10:45 AM Tonny Bollman, MD [facility] [phone] [facility]   04/27/2012 1:00 PM Radene Gunning [facility] [phone] None   05/04/2012 2:15 PM Amy Allegra Grana, PA [facility] [phone] None   05/14/2012 3:30 PM [facility] [phone] [facility]   05/14/2012 4:00 PM Fransisco Hertz, MD [facility] [phone] [facility]     Future Orders Please Complete By Expires   Diet - low sodium heart healthy      Increase activity slowly         Discharge Diagnoses:  Active Problems:  DVT  Chronic anticoagulation  Venous ulcer of right lower extremity with varicose veins  Leg pain  Cellulitis  Weakness generalized   Discharge Condition: improved/stable   Filed Weights   02/27/12 2014  Weight: 76.5 kg (168 lb 10.4 oz)    History of present illness:  The pt is an 78yo female Who was recently discharged after being treated for cellulitis. She last PM was complaining of leg pain- this pain has worsened since discharge. She has been unable to ambulate due to pain and weakness. Daughter says that since she was changed from IV to PO abx, her pain and redness had worsened. Also says there was a new ulcer on the back of her leg. She was seen in the wound clinic by Dr. Roxan Hockey last Thursday, started on an abx that was changed just yesterday (family could not recall the name).    This AM at 11 she developed an episode of confusion and inability to swallow pills. Family states she ws mumbling. She has since passed a swallow eval and is eating in the ER.    Hospital Course by problem list:  Probable TIA  - MRI neg for acute findings  -pt had echo a couple of weeks echo showed an ejection fraction of 60-65% with normal wall motion-no regional wall motion abnormalities. Doppler parameters are consistent with grade 1 diastolic dysfunction. There was no aortic stenosis.asymmetric basal septal hypertrophy-given age, this may be a "senile septum" variant-02/11/12, and so  it was not repeated. -She was on Coumadin prior to admission and this was continued her INR today is 1.94-she is to have a PT/INR done in the a.m. and dose to be further adjusted as clinically appropriate. - Pt was seen by physical therapy and recommended a skilled nursing for rehabilitation. H/o DVT  - anticoagulation as discussed above, INR trending up,remains subtherapeutic  Venous ulcer of right lower extremity with varicose veins/Cellulitis   -  wound care orders for further recommendations on admission. She is to continue followup at the wound care center  -And started on antibiotics on 9/5 at wound care center and a culture was done then. She is to continue the Antibiotic upon discharge. Leg pain  -secondary To above, improved with treatment as above as well as pain management. Weakness generalized  -PT saw pt  and recommended  skilled nursing for rehabilitation.  Hypokalemia  -resolved   Procedures:  none  Discharge Exam: Filed Vitals:   02/29/12 1950 02/29/12 2144 03/01/12 0436 03/01/12 0810  BP:  129/69 135/71   Pulse:  71 62   Temp:  97.6 F (36.4 C) 98.1 F (36.7 C)   TempSrc:  Oral Oral   Resp:  18 16   Height:      Weight:      SpO2: 93% 94% 97% 95%   Exam:  General: Elderly female in NAD, A&O x 3, she is blind Cardiovascular: RRR  Respiratory: clear anterior, no wheezing   Abdomen: +Bs, soft, NT/ND  Skin: right leg with venous stasis changes/hyperpigmentation, decreased pulses, two areas of venous ulceration on noted on lower leg.       Discharge Instructions  Discharge Orders    Future Appointments: Provider: Department: Dept Phone: Center:   03/01/2012 3:30 PM Su Monks, PA-C [facility] [phone] [facility]   03/15/2012 2:30 PM Newt Lukes, MD [facility] [phone] [facility]   03/26/2012 10:45 AM Tonny Bollman, MD [facility] [phone] [facility]   04/27/2012 1:00 PM Radene Gunning [facility] [phone] None   05/04/2012 2:15 PM Amy Allegra Grana, PA [facility] [phone] None   05/14/2012 3:30 PM [facility] [phone] [facility]   05/14/2012 4:00 PM Fransisco Hertz, MD [facility] [phone] [facility]     Future Orders Please Complete By Expires   Diet - low sodium heart healthy      Increase activity slowly        Medication List  As of 03/01/2012 12:40 PM   STOP taking these medications         predniSONE 10 MG tablet         TAKE these medications         albuterol 108 (90 BASE) MCG/ACT inhaler   Commonly known as: PROVENTIL HFA;VENTOLIN HFA   Inhale 2 puffs into the lungs every 6 (six) hours as needed. For shortness of breath.      ALBUTEROL SULFATE IN   Inhale into the lungs.      albuterol-ipratropium 18-103 MCG/ACT inhaler   Commonly known as: COMBIVENT   Inhale 1-2 puffs into the lungs every 6 (six) hours as needed for wheezing or shortness of breath.      azithromycin 500 MG tablet   Commonly known as: ZITHROMAX   Take 1 tablet (500 mg total) by mouth daily.      brimonidine 0.15 % ophthalmic solution   Commonly known as: ALPHAGAN   Place 1 drop into both eyes every 12 (twelve) hours.      cholecalciferol 1000 UNITS tablet   Commonly known as: VITAMIN D   Take 1,000 Units by mouth daily.      diphenhydrAMINE 25 MG tablet   Commonly known as: BENADRYL   Take 25 mg  by mouth as needed. Take with oxycodone to reduce itching      docusate sodium 100 MG capsule   Commonly known as: COLACE   Take 100 mg by mouth 2 (two) times daily.      dorzolamide-timolol 22.3-6.8 MG/ML ophthalmic solution   Commonly known as: COSOPT   Place 1 drop into both eyes 2 (two) times daily.      fentaNYL 12 MCG/HR   Commonly known as: DURAGESIC - dosed mcg/hr   Place 1 patch (12.5 mcg total) onto the skin every 3 (three) days.      fluocinolone  0.01 % external oil   Commonly known as: DERMA-SMOOTHE   Apply topically 3 (three) times daily.      Fluticasone-Salmeterol 250-50 MCG/DOSE Aepb   Commonly known as: ADVAIR   Inhale 1 puff into the lungs 2 (two) times daily.      gabapentin 300 MG capsule   Commonly known as: NEURONTIN   Take 900 mg by mouth 2 (two) times daily.      latanoprost 0.005 % ophthalmic solution   Commonly known as: XALATAN   Place 1 drop into both eyes at bedtime.      levETIRAcetam 500 MG 24 hr tablet   Commonly known as: KEPPRA XR   Take 1,000 mg by mouth at bedtime.      levothyroxine 88 MCG tablet   Commonly known as: SYNTHROID, LEVOTHROID   Take 88 mcg by mouth daily.      multivitamin with minerals Tabs   Take 1 tablet by mouth daily.      NIFEdipine 30 MG 24 hr tablet   Commonly known as: PROCARDIA-XL/ADALAT-CC/NIFEDICAL-XL   Take 30 mg by mouth daily.      omeprazole 20 MG capsule   Commonly known as: PRILOSEC   Take 20 mg by mouth 2 (two) times daily.      oxyCODONE-acetaminophen 5-325 MG per tablet   Commonly known as: PERCOCET/ROXICET   Take 1 tablet by mouth every 8 (eight) hours as needed for pain. For pain.      pilocarpine 1 % ophthalmic solution   Commonly known as: PILOCAR   Place 1 drop into both eyes 3 (three) times daily.      solifenacin 5 MG tablet   Commonly known as: VESICARE   Take 5 mg by mouth daily.      tiotropium 18 MCG inhalation capsule   Commonly known as: SPIRIVA   Place 1 capsule (18 mcg  total) into inhaler and inhale daily.      valsartan 80 MG tablet   Commonly known as: DIOVAN   Take 80 mg by mouth daily.      warfarin 5 MG tablet   Commonly known as: COUMADIN   Take 0.5-1 tablets (2.5-5 mg total) by mouth daily. 1 tab daily except for 0.5 tabs on Wednesdays and Fridays.              The results of significant diagnostics from this hospitalization (including imaging, microbiology, ancillary and laboratory) are listed below for reference.    Significant Diagnostic Studies: Ct Angio Head W/cm &/or Wo Cm  02/27/2012  *RADIOLOGY REPORT*  Clinical Data:  78 year old female with history of aneurysm. Transient ischemic attack.  CT ANGIOGRAPHY HEAD  Technique:  Multidetector CT imaging of the head was performed using the standard protocol during bolus administration of intravenous contrast.  Multiplanar CT image reconstructions including MIPs were obtained to evaluate the vascular anatomy.  Contrast: OMNIPAQUE IOHEXOL 350 MG/ML SOLN  Comparison:   head CT without contrast 08/19/2011.  Brain MRI 04/30/2009.  Findings:   Visualized orbits and scalp soft tissues are within normal limits.  Visualized paranasal sinuses and mastoids are clear.  No acute osseous abnormality identified.  Chronic bilateral PCA territory encephalomalacia.  Dystrophic calcifications are associated, more so on the right.  The left posterior MCA territory is also affected, up to the superior left parietal lobe.  This is unchanged.  Stable ventriculomegaly. No midline shift, mass effect, or evidence of mass lesion.  No acute intracranial hemorrhage identified.  No evidence of  cortically based acute infarction identified.  No abnormal enhancement identified.  Vascular Findings: Major intracranial venous structures are enhancing.    Codominant distal vertebral arteries are patent.  Normal right PICA.  Normal vertebrobasilar junction.  Dominant left AICA. Tortuous basilar artery without stenosis.  SCA and PCA  origins are within normal limits.  Posterior communicating arteries are diminutive or absent.  Bilateral PCA branches are within normal limits.  Negative visualized distal cervical ICA.  Moderately calcified bilateral ICA siphons, greater on the right.  Subsequent stenosis is less than 50 % with respect to the distal vessel.  Ophthalmic artery origins and supraclinoid ICA segments are within normal limits.  Normal carotid termini.  Dominant left ACA A1 segment.  ACA origins are within normal limits.  Diminutive or absent anterior communicating artery.  Bilateral ACA branches are within normal limits.  MCA origins are within normal limits.  Bilateral MCA branches are within normal limits.    Review of the MIP images confirms the above findings.  IMPRESSION: 1.  ICA atherosclerosis and mild basilar artery tortuosity.  No intracranial aneurysm, significant stenosis, or major arterial branch occlusion identified. 2.  Chronic right PCA and left PCA / MCA infarcts are unchanged since 2010. 3. No acute intracranial abnormality.   Original Report Authenticated By: Harley Hallmark, M.D.    Mri Brain Without Contrast  02/28/2012  *RADIOLOGY REPORT*  Clinical Data: 78 year old female with weakness.  History of previous stroke.  MRI HEAD WITHOUT CONTRAST  Technique:  Multiplanar, multiecho pulse sequences of the brain and surrounding structures were obtained according to standard protocol without intravenous contrast.  Comparison: Head CTA from earlier the same day.  Brain MRI 04/30/2009.  Findings: No restricted diffusion to suggest acute infarction. Chronic left parietal lobe and bilateral occipital lobe infarct with encephalomalacia and gliosis is unchanged since 2010.  Interval mild additional enlargement of the ventricles.  No evidence of transependymal edema.  The temporal horns remain relatively gracile.  Major intracranial vascular flow voids are stable. No midline shift, mass effect, or evidence of mass lesion.   Chronic blood products associated with the bilateral remote infarcts.  There is a mild degree of superficial siderosis over the bilateral superior hemispheres.  Occasional other chronic micro hemorrhages in the brain.  No new signal abnormality.  Stable visualized cervical spine with degenerative anterolisthesis of C3 on C4 contributing to a degree of spinal stenosis.  Negative pituitary. Visualized orbit soft tissues are within normal limits. Minimal paranasal sinus mucosal thickening.  Minimal fluid in the left mastoids.  Negative nasopharynx.  Negative scalp soft tissues.  IMPRESSION: 1. No acute intracranial abnormality. 2.  Bilateral occipital lobe and left parietal lobe chronic hemorrhagic infarcts. 3.  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 4.  Mild ventricular enlargement since 2010.   Original Report Authenticated By: Harley Hallmark, M.D.    US Renal  02/10/2012  *RADIOLOGY REPORT*  Clinical Data: Renal failure  RENAL/URINARY TRACT ULTRASOUND COMPLETE  Comparison:  Lumbar MRI 03/22/2011  Findings:  Right Kidney:  9.7 cm.  Negative for obstruction.  Left Kidney:  Not well visualized by ultrasound.  No definite hydronephrosis.  Accurate  kidney measurements were not possible due to poor visualization of the kidney.  Bladder:  Normal  IMPRESSION: Right kidney appears normal.  Left kidney is not well seen but does not appear to demonstrate hydronephrosis.   Original Report Authenticated By: Camelia Phenes, M.D.    Dg Chest Port 1  View  02/10/2012  *RADIOLOGY REPORT*  Clinical Data: Low oxygen saturation  PORTABLE CHEST - 1 VIEW  Comparison: Prior chest x-ray 01/28/2012, prior chest CT 01/22/2011  Findings: Single portable view of the chest.  No pulmonary edema, or focal airspace consolidation.  Linear opacity in the left retrocardiac region likely reflects a small amount of atelectasis. No pleural effusion, or pneumothorax.  Cardiac and mediastinal contours within  normal limits.  Atherosclerotic calcification of the thoracic aorta noted.  No acute osseous findings.  IMPRESSION: No acute cardiopulmonary disease.   Original Report Authenticated By: Vilma Prader     Microbiology: No results found for this or any previous visit (from the past 240 hour(s)).   Labs: Basic Metabolic Panel:  Lab 02/29/12 1610 02/28/12 0515 02/27/12 1548  NA 140 142 142  K 3.5 3.4* 3.5  CL 105 106 105  CO2 27 28 29   GLUCOSE 94 100* 90  BUN 16 11 14   CREATININE 1.15* 1.11* 1.12*  CALCIUM 8.8 9.0 9.2  MG -- -- 2.1  PHOS -- -- 1.9*   Liver Function Tests: No results found for this basename: AST:5,ALT:5,ALKPHOS:5,BILITOT:5,PROT:5,ALBUMIN:5 in the last 168 hours No results found for this basename: LIPASE:5,AMYLASE:5 in the last 168 hours No results found for this basename: AMMONIA:5 in the last 168 hours CBC:  Lab 02/28/12 0515 02/27/12 1548  WBC 7.8 8.0  NEUTROABS -- 3.5  HGB 12.1 13.0  HCT 36.7 38.8  MCV 81.4 81.3  PLT 188 204   Cardiac Enzymes:  Lab 02/27/12 1548  CKTOTAL --  CKMB --  CKMBINDEX --  TROPONINI <0.30   BNP: BNP (last 3 results)  Basename 02/10/12 2215  PROBNP 547.6*   CBG: No results found for this basename: GLUCAP:5 in the last 168 hours  Time coordinating discharge:>30 minutes  Signed:  Keana Dueitt C  [facility] 03/01/2012, 12:40 PM

## 2012-03-01 NOTE — Progress Notes (Signed)
Patient cleared for discharge. Patient accepted at [facility]. Family completing paperwork at 1pm. Packet copied and placed in [place]. ptar called for transportation.  Curlee Bogan C. Pietro Bonura MSW, LCSW [phone]

## 2012-03-01 NOTE — ED Notes (Signed)
Daughter insisted EMS be called d/t unresponsive. Pt was sleeping upon arrival of EMS and sat up straight to a light sternal rub. CBG - 98 PTA. Pt needed no other treatment PTA. Pt alert, oriented, and had no complaints.

## 2012-03-01 NOTE — ED Notes (Signed)
Pt discharged from inpatient stay today and daughter seems to think she was discharged to hospital too soon. Pt responds to verbal stimuli, denies pain.

## 2012-03-01 NOTE — Progress Notes (Signed)
Occupational Therapy Treatment Patient Details Name: Jennifer Brennan MRN: [id number] DOB: 10-13-1923 Today's Date: 03/01/2012 Time: 4098-1191 OT Time Calculation (min): 26 min  OT Assessment / Plan / Recommendation Comments on Treatment Session Pt improving.  Still needs A x 2 for transfers and bed mobility  Desaturated to 88% when 02 removed briefly.  Educated on pursed lip breathing but pt's 02 did not recover quickly.  Reapplied 02    Follow Up Recommendations  Skilled nursing facility    Barriers to Discharge       Equipment Recommendations  Defer to next venue    Recommendations for Other Services    Frequency Min 1X/week   Plan      Precautions / Restrictions Precautions Precautions: Fall Precaution Comments: blind and wound on R distal LE Restrictions Weight Bearing Restrictions: No   Pertinent Vitals/Pain Pain in RLE--not rated.  Sats 88 - 95% (desaturated when 02 removed briefly at eob)    ADL  Grooming: Performed;Brushing hair; occasional min A for sitting balance Where Assessed - Grooming: Unsupported sitting (min cues for posture) Upper Body Bathing: Minimal assistance;Other (comment) (occasional min a for posture initially sitting eob) Toilet Transfer: Simulated;+2 Total assistance Toilet Transfer: Patient Percentage: 60% Toilet Transfer Method: Stand pivot (mod cues, wt shifting assist) Transfers/Ambulation Related to ADLs: Pt given verbal and tactile cues ADL Comments: Pt initially extends trunk when sitting or standing but corrects with cues.  Pt combed own hair and daughter braided it for her    OT Diagnosis:    OT Problem List:   OT Treatment Interventions:     OT Goals Acute Rehab OT Goals Time For Goal Achievement: 03/13/12 ADL Goals Pt Will Perform Grooming: with supervision;Sitting, edge of bed;Sitting, chair;Unsupported ADL Goal: Grooming - Progress: Progressing toward goals Pt Will Transfer to Toilet: with max assist;Stand pivot transfer;with  DME;3-in-1 ADL Goal: Toilet Transfer - Progress: Progressing toward goals Additional ADL Goal #1: Pt will transfer to EOB in prep for ADL with max assist.  ADL Goal: Additional Goal #1 - Progress: Goal set today  Visit Information  Last OT Received On: 03/01/12 Assistance Needed: +2 PT/OT Co-Evaluation/Treatment: Yes    Subjective Data      Prior Functioning       Cognition  Overall Cognitive Status: History of cognitive impairments - at baseline Arousal/Alertness: Awake/alert Orientation Level: Appears intact for tasks assessed Behavior During Session: [facility] for tasks performed    Mobility  Shoulder Instructions Bed Mobility Bed Mobility: Supine to Sit Supine to Sit: 1: +2 Total assist Supine to Sit: Patient Percentage: 20% Details for Bed Mobility Assistance: Assist for B LEs off of bed and for trunk to attain sitting position.  Verbal and manual cuing for hand placement and technique.  Also used pad on bed to assist hips.  Transfers Sit to Stand: 1: +2 Total assist;From elevated surface;With upper extremity assist;From bed Sit to Stand: Patient Percentage: 50% Stand to Sit: 1: +2 Total assist;With upper extremity assist;With armrests;To chair/3-in-1 Stand to Sit: Patient Percentage: 50% Details for Transfer Assistance: Assist to rise and maintain upright posture throughout with verbal and manual cuing for hand placement when sitting/standing and also for weight shifting and LE advancement when performing transfer.        Exercises      Balance Dynamic Sitting Balance Dynamic Sitting - Balance Support: Left upper extremity supported;Feet supported Dynamic Sitting - Level of Assistance: 5: Stand by assistance;4: Min assist Dynamic Sitting - Comments: Pt able to sit EOB  x approx 10-12 mins to perform grooming/hair care at intermittent min to supervision for occassional posterior leaning. Able to correct with cues.    End of Session OT - End of Session Equipment Utilized  During Treatment: Gait belt Activity Tolerance: Patient tolerated treatment well Patient left: in chair;with call bell/phone within reach;with family/visitor present  GO     Rida Loudin 03/01/2012, 12:49 PM Marica Otter, OTR/L [phone] 03/01/2012

## 2012-03-02 ENCOUNTER — Inpatient Hospital Stay (HOSPITAL_COMMUNITY): Payer: Medicare Other

## 2012-03-02 ENCOUNTER — Encounter (HOSPITAL_COMMUNITY): Payer: Self-pay | Admitting: Internal Medicine

## 2012-03-02 DIAGNOSIS — I10 Essential (primary) hypertension: Secondary | ICD-10-CM

## 2012-03-02 DIAGNOSIS — R569 Unspecified convulsions: Secondary | ICD-10-CM

## 2012-03-02 DIAGNOSIS — I6350 Cerebral infarction due to unspecified occlusion or stenosis of unspecified cerebral artery: Secondary | ICD-10-CM

## 2012-03-02 DIAGNOSIS — G9340 Encephalopathy, unspecified: Principal | ICD-10-CM

## 2012-03-02 LAB — URINE MICROSCOPIC-ADD ON

## 2012-03-02 LAB — COMPREHENSIVE METABOLIC PANEL
ALT: 15 U/L (ref 0–35)
AST: 18 U/L (ref 0–37)
AST: 27 U/L (ref 0–37)
Albumin: 2.8 g/dL — ABNORMAL LOW (ref 3.5–5.2)
Albumin: 2.8 g/dL — ABNORMAL LOW (ref 3.5–5.2)
CO2: 29 mEq/L (ref 19–32)
Calcium: 8.6 mg/dL (ref 8.4–10.5)
Chloride: 104 mEq/L (ref 96–112)
Chloride: 104 mEq/L (ref 96–112)
Creatinine, Ser: 1.2 mg/dL — ABNORMAL HIGH (ref 0.50–1.10)
Creatinine, Ser: 1.24 mg/dL — ABNORMAL HIGH (ref 0.50–1.10)
GFR calc non Af Amer: 38 mL/min — ABNORMAL LOW (ref >90)
Sodium: 138 mEq/L (ref 135–145)
Total Bilirubin: 0.3 mg/dL (ref 0.3–1.2)
Total Bilirubin: 0.4 mg/dL (ref 0.3–1.2)

## 2012-03-02 LAB — URINALYSIS, ROUTINE W REFLEX MICROSCOPIC
Bilirubin Urine: NEGATIVE
Glucose, UA: NEGATIVE mg/dL
Hgb urine dipstick: NEGATIVE
Protein, ur: NEGATIVE mg/dL
Specific Gravity, Urine: 1.017 (ref 1.005–1.030)
Urobilinogen, UA: 0.2 mg/dL (ref 0.0–1.0)

## 2012-03-02 LAB — CBC WITH DIFFERENTIAL/PLATELET
Basophils Absolute: 0 10*3/uL (ref 0.0–0.1)
Basophils Relative: 0 % (ref 0–1)
Eosinophils Absolute: 0.4 10*3/uL (ref 0.0–0.7)
Eosinophils Relative: 4 % (ref 0–5)
HCT: 37 % (ref 36.0–46.0)
Hemoglobin: 12.3 g/dL (ref 12.0–15.0)
Lymphocytes Relative: 32 % (ref 12–46)
Lymphs Abs: 3.8 10*3/uL (ref 0.7–4.0)
MCH: 26.9 pg (ref 26.0–34.0)
MCHC: 34.1 g/dL (ref 30.0–36.0)
MCV: 80.8 fL (ref 78.0–100.0)
Monocytes Absolute: 0.8 10*3/uL (ref 0.1–1.0)
Monocytes Relative: 10 % (ref 3–12)
Neutro Abs: 6 10*3/uL (ref 1.7–7.7)
Neutrophils Relative %: 57 % (ref 43–77)
Platelets: 184 10*3/uL (ref 150–400)
RBC: 4.58 MIL/uL (ref 3.87–5.11)
RDW: 15.6 % — ABNORMAL HIGH (ref 11.5–15.5)
WBC: 10.5 10*3/uL (ref 4.0–10.5)

## 2012-03-02 LAB — MRSA PCR SCREENING: MRSA by PCR: NEGATIVE

## 2012-03-02 LAB — PROTIME-INR
INR: 1.91 — ABNORMAL HIGH (ref 0.00–1.49)
Prothrombin Time: 22.2 seconds — ABNORMAL HIGH (ref 11.6–15.2)

## 2012-03-02 LAB — BLOOD GAS, ARTERIAL
Acid-Base Excess: 2 mmol/L (ref 0.0–2.0)
Bicarbonate: 26.6 mEq/L — ABNORMAL HIGH (ref 20.0–24.0)
O2 Saturation: 95.8 %
pO2, Arterial: 79.9 mmHg — ABNORMAL LOW (ref 80.0–100.0)

## 2012-03-02 LAB — TSH: TSH: 16.576 u[IU]/mL — ABNORMAL HIGH (ref 0.350–4.500)

## 2012-03-02 MED ORDER — NIFEDIPINE ER 30 MG PO TB24
30.0000 mg | ORAL_TABLET | Freq: Every day | ORAL | Status: DC
  Administered 2012-03-02 – 2012-03-04 (×3): 30 mg via ORAL
  Filled 2012-03-02 (×3): qty 1

## 2012-03-02 MED ORDER — PANTOPRAZOLE SODIUM 40 MG PO TBEC
40.0000 mg | DELAYED_RELEASE_TABLET | Freq: Every day | ORAL | Status: DC
  Administered 2012-03-02 – 2012-03-04 (×3): 40 mg via ORAL
  Filled 2012-03-02 (×3): qty 1

## 2012-03-02 MED ORDER — OXYCODONE-ACETAMINOPHEN 5-325 MG PO TABS
1.0000 | ORAL_TABLET | Freq: Three times a day (TID) | ORAL | Status: DC | PRN
  Administered 2012-03-02: 1 via ORAL
  Filled 2012-03-02: qty 1

## 2012-03-02 MED ORDER — WARFARIN - PHARMACIST DOSING INPATIENT: Freq: Every day | Status: DC

## 2012-03-02 MED ORDER — SODIUM CHLORIDE 0.9 % IJ SOLN
3.0000 mL | Freq: Two times a day (BID) | INTRAMUSCULAR | Status: DC
  Administered 2012-03-04: 3 mL via INTRAVENOUS

## 2012-03-02 MED ORDER — ONDANSETRON HCL 4 MG/2ML IJ SOLN: 4.0000 mg | Freq: Four times a day (QID) | INTRAMUSCULAR | Status: DC | PRN

## 2012-03-02 MED ORDER — AZITHROMYCIN 500 MG PO TABS
500.0000 mg | ORAL_TABLET | Freq: Every day | ORAL | Status: DC
  Administered 2012-03-02 – 2012-03-04 (×3): 500 mg via ORAL
  Filled 2012-03-02 (×3): qty 1

## 2012-03-02 MED ORDER — ONDANSETRON HCL 4 MG/2ML IJ SOLN: 4.0000 mg | Freq: Three times a day (TID) | INTRAMUSCULAR | Status: DC | PRN

## 2012-03-02 MED ORDER — GABAPENTIN 300 MG PO CAPS
900.0000 mg | ORAL_CAPSULE | Freq: Two times a day (BID) | ORAL | Status: DC
  Administered 2012-03-02 – 2012-03-04 (×5): 900 mg via ORAL
  Filled 2012-03-02 (×6): qty 3

## 2012-03-02 MED ORDER — HYDROCORTISONE 0.5 % EX CREA
TOPICAL_CREAM | Freq: Three times a day (TID) | CUTANEOUS | Status: DC
  Filled 2012-03-02: qty 28.35

## 2012-03-02 MED ORDER — ONDANSETRON HCL 4 MG PO TABS: 4.0000 mg | ORAL_TABLET | Freq: Four times a day (QID) | ORAL | Status: DC | PRN

## 2012-03-02 MED ORDER — ACETAMINOPHEN 325 MG PO TABS
650.0000 mg | ORAL_TABLET | Freq: Four times a day (QID) | ORAL | Status: DC | PRN
  Administered 2012-03-02 – 2012-03-03 (×2): 650 mg via ORAL
  Filled 2012-03-02 (×2): qty 2

## 2012-03-02 MED ORDER — DOCUSATE SODIUM 100 MG PO CAPS
100.0000 mg | ORAL_CAPSULE | Freq: Two times a day (BID) | ORAL | Status: DC
  Administered 2012-03-02 – 2012-03-04 (×5): 100 mg via ORAL
  Filled 2012-03-02 (×6): qty 1

## 2012-03-02 MED ORDER — BRIMONIDINE TARTRATE 0.15 % OP SOLN
1.0000 [drp] | Freq: Two times a day (BID) | OPHTHALMIC | Status: DC
  Administered 2012-03-02 – 2012-03-04 (×4): 1 [drp] via OPHTHALMIC

## 2012-03-02 MED ORDER — PILOCARPINE HCL 1 % OP SOLN
1.0000 [drp] | Freq: Three times a day (TID) | OPHTHALMIC | Status: DC
  Filled 2012-03-02: qty 15

## 2012-03-02 MED ORDER — LATANOPROST 0.005 % OP SOLN
1.0000 [drp] | Freq: Every day | OPHTHALMIC | Status: DC
  Filled 2012-03-02: qty 2.5

## 2012-03-02 MED ORDER — FLUTICASONE-SALMETEROL 250-50 MCG/DOSE IN AEPB
1.0000 | INHALATION_SPRAY | Freq: Two times a day (BID) | RESPIRATORY_TRACT | Status: DC
  Administered 2012-03-02 – 2012-03-04 (×5): 1 via RESPIRATORY_TRACT
  Filled 2012-03-02: qty 14

## 2012-03-02 MED ORDER — TIOTROPIUM BROMIDE MONOHYDRATE 18 MCG IN CAPS
18.0000 ug | ORAL_CAPSULE | Freq: Every day | RESPIRATORY_TRACT | Status: DC
  Administered 2012-03-02 – 2012-03-04 (×3): 18 ug via RESPIRATORY_TRACT
  Filled 2012-03-02: qty 5

## 2012-03-02 MED ORDER — SODIUM CHLORIDE 0.9 % IV SOLN: INTRAVENOUS | Status: DC

## 2012-03-02 MED ORDER — ADULT MULTIVITAMIN W/MINERALS CH
1.0000 | ORAL_TABLET | Freq: Every day | ORAL | Status: DC
  Administered 2012-03-02 – 2012-03-04 (×3): 1 via ORAL
  Filled 2012-03-02 (×3): qty 1

## 2012-03-02 MED ORDER — LEVETIRACETAM 500 MG PO TABS
1000.0000 mg | ORAL_TABLET | Freq: Every day | ORAL | Status: DC
  Administered 2012-03-02 – 2012-03-03 (×2): 1000 mg via ORAL
  Filled 2012-03-02 (×3): qty 2

## 2012-03-02 MED ORDER — SODIUM CHLORIDE 0.9 % IJ SOLN
3.0000 mL | Freq: Two times a day (BID) | INTRAMUSCULAR | Status: DC
  Administered 2012-03-02 – 2012-03-03 (×4): 3 mL via INTRAVENOUS

## 2012-03-02 MED ORDER — VITAMIN D3 25 MCG (1000 UNIT) PO TABS
1000.0000 [IU] | ORAL_TABLET | Freq: Every day | ORAL | Status: DC
  Administered 2012-03-02 – 2012-03-04 (×3): 1000 [IU] via ORAL
  Filled 2012-03-02 (×3): qty 1

## 2012-03-02 MED ORDER — PILOCARPINE HCL 1 % OP SOLN
1.0000 [drp] | Freq: Three times a day (TID) | OPHTHALMIC | Status: DC
  Administered 2012-03-02 – 2012-03-04 (×5): 1 [drp] via OPHTHALMIC

## 2012-03-02 MED ORDER — IPRATROPIUM-ALBUTEROL 20-100 MCG/ACT IN AERS
1.0000 | INHALATION_SPRAY | Freq: Four times a day (QID) | RESPIRATORY_TRACT | Status: DC | PRN
  Filled 2012-03-02: qty 4

## 2012-03-02 MED ORDER — ACETAMINOPHEN 650 MG RE SUPP: 650.0000 mg | Freq: Four times a day (QID) | RECTAL | Status: DC | PRN

## 2012-03-02 MED ORDER — IRBESARTAN 75 MG PO TABS
75.0000 mg | ORAL_TABLET | Freq: Every day | ORAL | Status: DC
  Administered 2012-03-02 – 2012-03-04 (×3): 75 mg via ORAL
  Filled 2012-03-02 (×3): qty 1

## 2012-03-02 MED ORDER — ALBUTEROL SULFATE HFA 108 (90 BASE) MCG/ACT IN AERS
2.0000 | INHALATION_SPRAY | Freq: Four times a day (QID) | RESPIRATORY_TRACT | Status: DC | PRN
  Filled 2012-03-02: qty 6.7

## 2012-03-02 MED ORDER — FLUOCINOLONE ACETONIDE 0.01 % EX OIL: TOPICAL_OIL | Freq: Three times a day (TID) | CUTANEOUS | Status: DC

## 2012-03-02 MED ORDER — DORZOLAMIDE HCL-TIMOLOL MAL 2-0.5 % OP SOLN
1.0000 [drp] | Freq: Two times a day (BID) | OPHTHALMIC | Status: DC
  Filled 2012-03-02: qty 10

## 2012-03-02 MED ORDER — LATANOPROST 0.005 % OP SOLN
1.0000 [drp] | Freq: Every day | OPHTHALMIC | Status: DC
  Administered 2012-03-03: 1 [drp] via OPHTHALMIC

## 2012-03-02 MED ORDER — WARFARIN SODIUM 5 MG PO TABS
5.0000 mg | ORAL_TABLET | Freq: Once | ORAL | Status: AC
  Administered 2012-03-02: 5 mg via ORAL
  Filled 2012-03-02: qty 1

## 2012-03-02 MED ORDER — FENTANYL 12 MCG/HR TD PT72
12.5000 ug | MEDICATED_PATCH | TRANSDERMAL | Status: DC
  Administered 2012-03-04: 12.5 ug via TRANSDERMAL
  Filled 2012-03-02: qty 1

## 2012-03-02 MED ORDER — DORZOLAMIDE HCL-TIMOLOL MAL 2-0.5 % OP SOLN
1.0000 [drp] | Freq: Two times a day (BID) | OPHTHALMIC | Status: DC
  Administered 2012-03-02 – 2012-03-04 (×4): 1 [drp] via OPHTHALMIC

## 2012-03-02 MED ORDER — LEVOTHYROXINE SODIUM 88 MCG PO TABS
88.0000 ug | ORAL_TABLET | Freq: Every day | ORAL | Status: DC
  Administered 2012-03-02 – 2012-03-04 (×3): 88 ug via ORAL
  Filled 2012-03-02 (×3): qty 1

## 2012-03-02 NOTE — Progress Notes (Signed)
Went to give patient her Coumadin and she took her medicine but then refused to swallow it pocketing both water and the pill.  She wasn't responding to cues to swallow her medication by RN and family.  After ~15 minutes, patient did spit out a small amount of water. There was a tiny small piece of residue left from the medication. Previously today, the patient was alert and oriented x4 and taking her pills without difficulty even picking each pill out of the cup and taking on her own.  Patient is now very lethargic. VS were taken and are stable at this time.  BP 121/72, pulse 82.  MD was paged regarding patient condition and will continue to monitor at this time. Will page again with changes in patient status.

## 2012-03-02 NOTE — Progress Notes (Addendum)
ANTICOAGULATION CONSULT NOTE - Initial Consult  Pharmacy Consult for warfarin Indication: DVT and stroke  Allergies  Allergen Reactions  . Morphine Sulfate     REACTION: rash: ITCHING  . Penicillins     REACTION: urticaria (hives)  . Sertraline Hcl     REACTION: rash    Patient Measurements: Height: 5\' 2"  (157.5 cm) Weight: 164 lb 14.5 oz (74.8 kg) IBW/kg (Calculated) : 50.1  Heparin Dosing Weight:   Vital Signs: Temp: 97.7 F (36.5 C) (09/10 0510) Temp src: Oral (09/10 0510) BP: 127/72 mmHg (09/10 0510) Pulse Rate: 62  (09/10 0510)  Labs:  Basename 03/02/12 0005 03/01/12 0425 02/29/12 0433  HGB 12.7 -- --  HCT 37.2 -- --  PLT 184 -- --  APTT -- -- --  LABPROT -- 22.5* 21.5*  INR -- 1.94* 1.83*  HEPARINUNFRC -- -- --  CREATININE 1.24* -- 1.15*  CKTOTAL -- -- --  CKMB -- -- --  TROPONINI -- -- --    Estimated Creatinine Clearance: 30.8 ml/min (by C-G formula based on Cr of 1.24).   Medical History: Past Medical History  Diagnosis Date  . Hypertension   . Hypothyroidism   . Seizure disorder   . History of cerebrovascular accident   . Severe stage glaucoma     legally blind  . Depression with anxiety   . OAB (overactive bladder)   . ADENOCARCINOMA, LEFT BREAST 04/11/2010    s/p L mastectomy, on femara x 83yr  . BACK PAIN, LUMBAR, CHRONIC   . DVT 05/2007    RLE following R THR - chronic coumadin, chronic RLE pain  . GLAUCOMA   . OSTEOARTHRITIS, HANDS, BILATERAL   . OSTEOPENIA   . Retinal ischemia   . Sciatica of right side     chronic RLE pain, multifactorial - MRI T/L spine 02/2011  . VITAMIN D DEFICIENCY dx 08/2008  . Venous ulcer of right lower extremity with varicose veins   . Dementia     Medications:  Prescriptions prior to admission  Medication Sig Dispense Refill  . acetaminophen (TYLENOL) 650 MG suppository Place 650 mg rectally every 8 (eight) hours as needed. Low grade fever      . albuterol (PROVENTIL HFA;VENTOLIN HFA) 108 (90 BASE)  MCG/ACT inhaler Inhale 2 puffs into the lungs every 6 (six) hours as needed. For shortness of breath.      Marland Kitchen albuterol-ipratropium (COMBIVENT) 18-103 MCG/ACT inhaler Inhale 1-2 puffs into the lungs every 6 (six) hours as needed for wheezing or shortness of breath.  1 Inhaler  11  . azithromycin (ZITHROMAX) 500 MG tablet Take 1 tablet (500 mg total) by mouth daily.      . cholecalciferol (VITAMIN D) 1000 UNITS tablet Take 1,000 Units by mouth daily.      . diphenhydrAMINE (BENADRYL) 25 MG tablet Take 25 mg by mouth as needed. Take with oxycodone to reduce itching      . docusate sodium (COLACE) 100 MG capsule Take 100 mg by mouth 2 (two) times daily.       . dorzolamide-timolol (COSOPT) 22.3-6.8 MG/ML ophthalmic solution Place 1 drop into both eyes 2 (two) times daily.        . fentaNYL (DURAGESIC - DOSED MCG/HR) 12 MCG/HR Place 1 patch (12.5 mcg total) onto the skin every 3 (three) days.  5 patch  0  . fluocinolone (DERMA-SMOOTHE) 0.01 % external oil Apply topically 3 (three) times daily.      . Fluticasone-Salmeterol (ADVAIR) 250-50 MCG/DOSE AEPB Inhale 1  puff into the lungs 2 (two) times daily.  60 each  0  . gabapentin (NEURONTIN) 300 MG capsule Take 900 mg by mouth 2 (two) times daily.      Marland Kitchen latanoprost (XALATAN) 0.005 % ophthalmic solution Place 1 drop into both eyes at bedtime.        . levETIRAcetam (KEPPRA) 500 MG tablet Take 1,000 mg by mouth at bedtime.      Marland Kitchen levothyroxine (SYNTHROID, LEVOTHROID) 88 MCG tablet Take 88 mcg by mouth daily.      . Multiple Vitamin (MULTIVITAMIN WITH MINERALS) TABS Take 1 tablet by mouth daily.      Marland Kitchen NIFEdipine (PROCARDIA-XL/ADALAT-CC/NIFEDICAL-XL) 30 MG 24 hr tablet Take 30 mg by mouth daily.      Marland Kitchen omeprazole (PRILOSEC) 20 MG capsule Take 20 mg by mouth 2 (two) times daily.      Marland Kitchen oxyCODONE-acetaminophen (PERCOCET/ROXICET) 5-325 MG per tablet Take 1 tablet by mouth every 8 (eight) hours as needed for pain. For pain.  45 tablet  0  . pilocarpine (PILOCAR)  1 % ophthalmic solution Place 1 drop into both eyes 3 (three) times daily.       . solifenacin (VESICARE) 5 MG tablet Take 5 mg by mouth daily.      Marland Kitchen tiotropium (SPIRIVA) 18 MCG inhalation capsule Place 1 capsule (18 mcg total) into inhaler and inhale daily.  30 capsule  3  . valsartan (DIOVAN) 80 MG tablet Take 80 mg by mouth daily.      Marland Kitchen warfarin (COUMADIN) 5 MG tablet Take 0.5-1 tablets (2.5-5 mg total) by mouth daily. 1 tab daily except for 0.5 tabs on Wednesdays and Fridays.        Assessment: Chronic warfarin for VTE treatment.  INR 9/9 <2.  No INR this am yet.  INR ordered.  Goal of Therapy:  INR 2-3    Plan:  Daily INR, then dose warfarin based on level and prior doses  Darlina Guys, Jacquenette Shone Crowford 03/02/2012,5:27 AM  INR report <2, will dose 5mg  po warfarin at 1800 today. Luetta Nutting PharmD, BCPS  03/02/2012, 7:27 AM

## 2012-03-02 NOTE — Consult Note (Signed)
[facility] CONSULT [facility]     Reason for Consult: Altered mental status with reduced responsiveness transiently.    HPI:    Jennifer Brennan is an 78 y.o. female history of subarachnoid hemorrhage, seizure disorder, hypertension, dementia and glaucoma with blindness, presenting with recurrent episodes of confusion and reduced responsiveness. She's been on Keppra for seizures at 1000 mg at bedtime. She's also been taking gabapentin 900 mg every morning and each bedtime. Patient has missed doses of anticonvulsant medication because of lethargy. Seizures consist of periods of unresponsiveness with eyes deviated upward and limp muscle tone, followed by prolonged period of lethargy and confusion for at least several hours. She was discharged from the hospital yesterday for evaluation of similar episode of reduced responsiveness which was associated with fever and infection involving right lower extremity. Mental status has markedly improved since admission. Patient is currently alert and conversant. Laboratory studies were unremarkable including CBC and electrolytes. Serum ammonia was normal. CT scan of her head showed old left parietal and bilateral occipital infarctions as well as cerebral atrophy and small vessel ischemic changes. There were no acute intracranial abnormalities. Patient has been afebrile.  Past Medical History  Diagnosis Date  . Hypertension   . Hypothyroidism   . Seizure disorder   . History of cerebrovascular accident   . Severe stage glaucoma     legally blind  . Depression with anxiety   . OAB (overactive bladder)   . ADENOCARCINOMA, LEFT BREAST 04/11/2010    s/p L mastectomy, on femara x 5yr  . BACK PAIN, LUMBAR, CHRONIC   . DVT 05/2007    RLE following R THR - chronic coumadin, chronic RLE pain  . GLAUCOMA   . OSTEOARTHRITIS, HANDS, BILATERAL   . OSTEOPENIA   . Retinal ischemia   . Sciatica of right side     chronic RLE pain,  multifactorial - MRI T/L spine 02/2011  . VITAMIN D DEFICIENCY dx 08/2008  . Venous ulcer of right lower extremity with varicose veins   . Dementia     Past Surgical History  Procedure Date  . Total abdominal hysterectomy   . Total hip arthroplasty 10/2006    right hip  . Refractive surgery     B/L  . Breast surgery 03/2010    breast biopsy, L mastectomy   . Tonsillectomy       Social History:  reports that she quit smoking about 32 years ago. Her smoking use included Cigarettes. She quit after 45 years of use. She has never used smokeless tobacco. She reports that she does not drink alcohol or use illicit drugs.  Allergies  Allergen Reactions  . Morphine Sulfate     REACTION: rash: ITCHING  . Penicillins     REACTION: urticaria (hives)  . Sertraline Hcl     REACTION: rash    Medications:    Prior to Admission:  Prescriptions prior to admission  Medication Sig Dispense Refill  . acetaminophen (TYLENOL) 650 MG suppository Place 650 mg rectally every 8 (eight) hours as needed. Low grade fever      . albuterol (PROVENTIL HFA;VENTOLIN HFA) 108 (90 BASE) MCG/ACT inhaler Inhale 2 puffs into the lungs every 6 (six) hours as needed. For shortness of breath.      Marland Kitchen albuterol-ipratropium (COMBIVENT) 18-103 MCG/ACT inhaler Inhale 1-2 puffs into the lungs every 6 (six) hours as needed  for wheezing or shortness of breath.  1 Inhaler  11  . azithromycin (ZITHROMAX) 500 MG tablet Take 1 tablet (500 mg total) by mouth daily.      . cholecalciferol (VITAMIN D) 1000 UNITS tablet Take 1,000 Units by mouth daily.      . diphenhydrAMINE (BENADRYL) 25 MG tablet Take 25 mg by mouth as needed. Take with oxycodone to reduce itching      . docusate sodium (COLACE) 100 MG capsule Take 100 mg by mouth 2 (two) times daily.       . dorzolamide-timolol (COSOPT) 22.3-6.8 MG/ML ophthalmic solution Place 1 drop into both eyes 2 (two) times daily.        . fentaNYL (DURAGESIC - DOSED MCG/HR) 12 MCG/HR Place 1  patch (12.5 mcg total) onto the skin every 3 (three) days.  5 patch  0  . fluocinolone (DERMA-SMOOTHE) 0.01 % external oil Apply topically 3 (three) times daily.      . Fluticasone-Salmeterol (ADVAIR) 250-50 MCG/DOSE AEPB Inhale 1 puff into the lungs 2 (two) times daily.  60 each  0  . gabapentin (NEURONTIN) 300 MG capsule Take 900 mg by mouth 2 (two) times daily.      Marland Kitchen latanoprost (XALATAN) 0.005 % ophthalmic solution Place 1 drop into both eyes at bedtime.        . levETIRAcetam (KEPPRA) 500 MG tablet Take 1,000 mg by mouth at bedtime.      Marland Kitchen levothyroxine (SYNTHROID, LEVOTHROID) 88 MCG tablet Take 88 mcg by mouth daily.      . Multiple Vitamin (MULTIVITAMIN WITH MINERALS) TABS Take 1 tablet by mouth daily.      Marland Kitchen NIFEdipine (PROCARDIA-XL/ADALAT-CC/NIFEDICAL-XL) 30 MG 24 hr tablet Take 30 mg by mouth daily.      Marland Kitchen omeprazole (PRILOSEC) 20 MG capsule Take 20 mg by mouth 2 (two) times daily.      Marland Kitchen oxyCODONE-acetaminophen (PERCOCET/ROXICET) 5-325 MG per tablet Take 1 tablet by mouth every 8 (eight) hours as needed for pain. For pain.  45 tablet  0  . pilocarpine (PILOCAR) 1 % ophthalmic solution Place 1 drop into both eyes 3 (three) times daily.       . solifenacin (VESICARE) 5 MG tablet Take 5 mg by mouth daily.      Marland Kitchen tiotropium (SPIRIVA) 18 MCG inhalation capsule Place 1 capsule (18 mcg total) into inhaler and inhale daily.  30 capsule  3  . valsartan (DIOVAN) 80 MG tablet Take 80 mg by mouth daily.      Marland Kitchen warfarin (COUMADIN) 5 MG tablet Take 0.5-1 tablets (2.5-5 mg total) by mouth daily. 1 tab daily except for 0.5 tabs on Wednesdays and Fridays.       Scheduled:   . azithromycin  500 mg Oral Daily  . cholecalciferol  1,000 Units Oral Daily  . docusate sodium  100 mg Oral BID  . dorzolamide-timolol  1 drop Both Eyes BID  . fentaNYL  12.5 mcg Transdermal Q72H  . fluocinolone   Topical TID  . Fluticasone-Salmeterol  1 puff Inhalation BID  . gabapentin  900 mg Oral BID  . irbesartan  75  mg Oral Daily  . latanoprost  1 drop Both Eyes QHS  . levETIRAcetam  1,000 mg Oral QHS  . levothyroxine  88 mcg Oral Daily  . multivitamin with minerals  1 tablet Oral Daily  . NIFEdipine  30 mg Oral Daily  . pantoprazole  40 mg Oral Q1200  . pilocarpine  1 drop Both Eyes TID  .  sodium chloride  3 mL Intravenous Q12H  . sodium chloride  3 mL Intravenous Q12H  . tiotropium  18 mcg Inhalation Daily  . DISCONTD: sodium chloride   Intravenous STAT   LKG:[id number], acetaminophen, albuterol, Ipratropium-Albuterol, ondansetron (ZOFRAN) IV, ondansetron, oxyCODONE-acetaminophen, DISCONTD: ondansetron (ZOFRAN) IV  Blood pressure 127/72, pulse 62, temperature 97.7 F (36.5 C), temperature source Oral, resp. rate 16, height 5\' 2"  (1.575 m), weight 74.8 kg (164 lb 14.5 oz), SpO2 96.00%.   Neurologic Examination:  Mental Status: Alert, oriented to person and place, and to a lesser extent time.  Speech fluent without evidence of aphasia. Able to follow commands without difficulty. Cranial Nerves: II-bilateral blindness (chronic and associated with glaucoma). III/IV/VI-Pupils were equal and small and showed no clear reaction to light. Extraocular movements were full and conjugate.    V/VII-no facial numbness and no facial weakness. VIII-normal. X-normal speech. Motor: 5/5 bilaterally with normal tone and bulk Sensory: Normal throughout. Deep Tendon Reflexes: 2+ and symmetric. Plantars: Mute bilaterally Cerebellar: Normal.    Dg Chest 2 View  03/02/2012  *RADIOLOGY REPORT*  Clinical Data: Fever.  CHEST - 2 VIEW  Comparison: 02/10/2012.  Findings: Poor inspiration with crowding of the pulmonary vasculature and interstitial markings.  Grossly clear lungs.  The cardiac silhouette remains grossly normal in size.  Diffuse osteopenia.  IMPRESSION: No acute abnormality.  Poor inspiration.   Original Report Authenticated By: Darrol Angel, M.D.    Ct Head Wo Contrast  03/02/2012  *RADIOLOGY  REPORT*  Clinical Data: Headache and weakness.  History of seizure disorder, aneurysm, and breast cancer.  CT HEAD WITHOUT CONTRAST  Technique:  Contiguous axial images were obtained from the base of the skull through the vertex without contrast.  Comparison: MRI brain 02/27/2012. CT head 02/27/2012.  Findings: Encephalomalacia in the left posterior parietal region and both occipital lobes consistent with old infarcts. Calcification in the right occipital region.  Diffuse cerebral atrophy.  Ventricular dilatation consistent with central atrophy. Low attenuation change in the deep white matter consistent with small vessel ischemic change.  No mass effect or midline shift.  No abnormal extra-axial fluid collections.  Basal cisterns are not effaced.  No evidence of acute intracranial hemorrhage.  No significant change since previous study.  No depressed skull fractures.  Visualized paranasal sinuses and mastoid air cells are not opacified.  Vascular calcifications.  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 Chronic atrophy and small vessel ischemic change.  No acute intracranial abnormalities.   Original Report Authenticated By: Marlon Pel, M.D.      Assessment/Plan:  Altered mental status most likely postictal state associated with recurrent seizure activity, patient with known seizure disorder following subarachnoid hemorrhage. There's no clinical sign of acute stroke. Patient is back to baseline at this point, per her daughter.  Recommendations: 1. Would recommend increasing Keppra to 750 mg twice a day. 2. Change gabapentin to 600 mg 3 times a day. 3. Cancel EEG, as patient has documented seizure disorder. EEG results would not affect management decisions.  Venetia Maxon M.D.  [facility] [phone]   03/02/2012, 6:36 AM

## 2012-03-02 NOTE — Progress Notes (Signed)
[facility] PROGRESS NOTE  CHAVELA JUSTINIANO JYN:[id number] DOB: 1923-06-26 DOA: 03/01/2012 PCP: [name] Paci, MD  I have seen and examined Ms Kassa [phone] with seizure d/o, HTN, CVA, just discharged 9/9 following admission difficulty swallowing & confusion thought to be secondary to TIA as w/u including MRI was neg for acute findings and discharged to rehab. Pt readmitted for episode of unresponiveness. Pt seen by Neuro this am and thought to have had likely seizure and keppra dose increased and EEG cancelled. Pt alert and oriented x3, in good spirits and denies any c/o this am. Monitor for any further episodes and further treat accordingly.    Kela Millin  [facility] Pager [phone]. If 8PM-8AM, please contact night-coverage at [email or web address], password [facility] 03/02/2012, 1:46 PM  LOS: 1 day

## 2012-03-02 NOTE — Consult Note (Signed)
WOC consult Note Reason for Consult:Chronic venous insufficiency ulceration on Right medial LE.   Wound type:venous insufficiency Pressure Ulcer POA: No Measurement:10cm x 8cm area of involvement with small open area 2cm x 56m  X .2cm visible.  The rest of the area of involvement is coated with a thick layer of dried zinc oxide paste and I am unable to remove it without causing the patient discomfort. Wound FAO:ZHYQ the partial thickness 2cm x 2cm x .2cm is visible that that wound bed is pink and moist.   Drainage (amount, consistency, odor) No drainage noted Periwound:pronounced hemosiderin staining.  Evidence of previous edema, now resolved by wrinkling of the skin. Dressing procedure/placement/frequency: We will cover the affected area with a soft silicone foam dressing and then ask ortho tech to place an Unna's Boot to this LE.  With each weekly removal, more of the dried zinc paste will come off. Prevalon boots are also provided to redistribute pressure and prevent heel breakdown. Lengthy discussion with patient's husband and daughter regarding philosophy (theirs) of keeping patient in the hospital until she is strong enough to go home.  They were told by Rehab facility staff that she was not yet able to participate in Rehab and that is why they sent her back to the hospital. Patient's husband inquiring about PT and OT starting here in hospital and it is noted that a referral for those services was placed today by Dr. Suanne Marker. I would expect that this patient can heal her right medial LE area of ulceration with compression and elevation in 2-4 weeks time.  Follow up by the [facility] outpatient wound care center is suggested following discharge; they have seen her for two visits recently. I will not follow.  Please re-consult if needed. Thanks, Ladona Mow, MSN, RN, [facility], CWOCN [phone])

## 2012-03-02 NOTE — H&P (Signed)
Jennifer Brennan is an 78 y.o. female.   Patient was seen and examined on March 02, 2012 at 5 AM. PCP - Dr. [name] Paci. Chief Complaint: Confusion. HPI: 78 year old female who was just discharged yesterday after being brought up and worked up for altered mental status and MRI of the brain done during the admission was negative for any stroke was discharged to nursing home yesterday and when she takes there patient became unresponsive and was brought back to the ER. By the time she reached ER patient became more alert but still confused. At the time I examined patient is becoming more alert and follows commands. Patient's daughter who was at the bedside states that she has improved a lot from last evening. CT head is negative. Patient is admitted for further management. Patient at this time is nonfocal. Still appears mildly confused. Patient did not have any nausea vomiting abdominal pain or diarrhea. As per patient's daughter last and she had a seizure was 2-3 months ago and at the time her medication was changed to Keppra.  Past Medical History  Diagnosis Date  . Hypertension   . Hypothyroidism   . Seizure disorder   . History of cerebrovascular accident   . Severe stage glaucoma     legally blind  . Depression with anxiety   . OAB (overactive bladder)   . ADENOCARCINOMA, LEFT BREAST 04/11/2010    s/p L mastectomy, on femara x 64yr  . BACK PAIN, LUMBAR, CHRONIC   . DVT 05/2007    RLE following R THR - chronic coumadin, chronic RLE pain  . GLAUCOMA   . OSTEOARTHRITIS, HANDS, BILATERAL   . OSTEOPENIA   . Retinal ischemia   . Sciatica of right side     chronic RLE pain, multifactorial - MRI T/L spine 02/2011  . VITAMIN D DEFICIENCY dx 08/2008  . Venous ulcer of right lower extremity with varicose veins   . Dementia     Past Surgical History  Procedure Date  . Total abdominal hysterectomy   . Total hip arthroplasty 10/2006    right hip  . Refractive surgery     B/L  .  Breast surgery 03/2010    breast biopsy, L mastectomy   . Tonsillectomy     Family History  Problem Relation Age of Onset  . Ovarian cancer Mother   . Cancer Mother   . Deep vein thrombosis Mother   . Heart disease Mother   . Arthritis Other     grandparents  . Lung cancer Other   . Heart disease Other     parent  . Cancer Father     BRAIN TUMOR  . Cancer Brother   . Hyperlipidemia Brother   . Hypertension Brother   . Stroke Brother   . Arthritis Brother   . Diabetes Daughter   . Heart disease Daughter   . Hypertension Daughter   . Hyperlipidemia Daughter   . Other Daughter     VARICOSE VEINS   Social History:  reports that she quit smoking about 32 years ago. Her smoking use included Cigarettes. She quit after 45 years of use. She has never used smokeless tobacco. She reports that she does not drink alcohol or use illicit drugs.  Allergies:  Allergies  Allergen Reactions  . Morphine Sulfate     REACTION: rash: ITCHING  . Penicillins     REACTION: urticaria (hives)  . Sertraline Hcl     REACTION: rash    Medications Prior to  Admission  Medication Sig Dispense Refill  . acetaminophen (TYLENOL) 650 MG suppository Place 650 mg rectally every 8 (eight) hours as needed. Low grade fever      . albuterol (PROVENTIL HFA;VENTOLIN HFA) 108 (90 BASE) MCG/ACT inhaler Inhale 2 puffs into the lungs every 6 (six) hours as needed. For shortness of breath.      Marland Kitchen albuterol-ipratropium (COMBIVENT) 18-103 MCG/ACT inhaler Inhale 1-2 puffs into the lungs every 6 (six) hours as needed for wheezing or shortness of breath.  1 Inhaler  11  . azithromycin (ZITHROMAX) 500 MG tablet Take 1 tablet (500 mg total) by mouth daily.      . cholecalciferol (VITAMIN D) 1000 UNITS tablet Take 1,000 Units by mouth daily.      . diphenhydrAMINE (BENADRYL) 25 MG tablet Take 25 mg by mouth as needed. Take with oxycodone to reduce itching      . docusate sodium (COLACE) 100 MG capsule Take 100 mg by mouth 2  (two) times daily.       . dorzolamide-timolol (COSOPT) 22.3-6.8 MG/ML ophthalmic solution Place 1 drop into both eyes 2 (two) times daily.        . fentaNYL (DURAGESIC - DOSED MCG/HR) 12 MCG/HR Place 1 patch (12.5 mcg total) onto the skin every 3 (three) days.  5 patch  0  . fluocinolone (DERMA-SMOOTHE) 0.01 % external oil Apply topically 3 (three) times daily.      . Fluticasone-Salmeterol (ADVAIR) 250-50 MCG/DOSE AEPB Inhale 1 puff into the lungs 2 (two) times daily.  60 each  0  . gabapentin (NEURONTIN) 300 MG capsule Take 900 mg by mouth 2 (two) times daily.      Marland Kitchen latanoprost (XALATAN) 0.005 % ophthalmic solution Place 1 drop into both eyes at bedtime.        . levETIRAcetam (KEPPRA) 500 MG tablet Take 1,000 mg by mouth at bedtime.      Marland Kitchen levothyroxine (SYNTHROID, LEVOTHROID) 88 MCG tablet Take 88 mcg by mouth daily.      . Multiple Vitamin (MULTIVITAMIN WITH MINERALS) TABS Take 1 tablet by mouth daily.      Marland Kitchen NIFEdipine (PROCARDIA-XL/ADALAT-CC/NIFEDICAL-XL) 30 MG 24 hr tablet Take 30 mg by mouth daily.      Marland Kitchen omeprazole (PRILOSEC) 20 MG capsule Take 20 mg by mouth 2 (two) times daily.      Marland Kitchen oxyCODONE-acetaminophen (PERCOCET/ROXICET) 5-325 MG per tablet Take 1 tablet by mouth every 8 (eight) hours as needed for pain. For pain.  45 tablet  0  . pilocarpine (PILOCAR) 1 % ophthalmic solution Place 1 drop into both eyes 3 (three) times daily.       . solifenacin (VESICARE) 5 MG tablet Take 5 mg by mouth daily.      Marland Kitchen tiotropium (SPIRIVA) 18 MCG inhalation capsule Place 1 capsule (18 mcg total) into inhaler and inhale daily.  30 capsule  3  . valsartan (DIOVAN) 80 MG tablet Take 80 mg by mouth daily.      Marland Kitchen warfarin (COUMADIN) 5 MG tablet Take 0.5-1 tablets (2.5-5 mg total) by mouth daily. 1 tab daily except for 0.5 tabs on Wednesdays and Fridays.        Results for orders placed during the hospital encounter of 03/01/12 (from the past 48 hour(s))  CBC WITH DIFFERENTIAL     Status: Abnormal    Collection Time   03/02/12 12:05 AM      Component Value Range Comment   WBC 10.5  4.0 - 10.5 K/uL  RBC 4.64  3.87 - 5.11 MIL/uL    Hemoglobin 12.7  12.0 - 15.0 g/dL    HCT 40.9  81.1 - 91.4 %    MCV 80.2  78.0 - 100.0 fL    MCH 27.4  26.0 - 34.0 pg    MCHC 34.1  30.0 - 36.0 g/dL    RDW 78.2 (*) 95.6 - 15.5 %    Platelets 184  150 - 400 K/uL    Neutrophils Relative 57  43 - 77 %    Neutro Abs 6.0  1.7 - 7.7 K/uL    Lymphocytes Relative 32  12 - 46 %    Lymphs Abs 3.4  0.7 - 4.0 K/uL    Monocytes Relative 8  3 - 12 %    Monocytes Absolute 0.8  0.1 - 1.0 K/uL    Eosinophils Relative 3  0 - 5 %    Eosinophils Absolute 0.3  0.0 - 0.7 K/uL    Basophils Relative 0  0 - 1 %    Basophils Absolute 0.0  0.0 - 0.1 K/uL   COMPREHENSIVE METABOLIC PANEL     Status: Abnormal   Collection Time   03/02/12 12:05 AM      Component Value Range Comment   Sodium 138  135 - 145 mEq/L    Potassium 4.2  3.5 - 5.1 mEq/L    Chloride 104  96 - 112 mEq/L    CO2 29  19 - 32 mEq/L    Glucose, Bld 107 (*) 70 - 99 mg/dL    BUN 15  6 - 23 mg/dL    Creatinine, Ser 2.13 (*) 0.50 - 1.10 mg/dL    Calcium 8.7  8.4 - 08.6 mg/dL    Total Protein 6.4  6.0 - 8.3 g/dL    Albumin 2.8 (*) 3.5 - 5.2 g/dL    AST 27  0 - 37 U/L    ALT 15  0 - 35 U/L    Alkaline Phosphatase 44  39 - 117 U/L    Total Bilirubin 0.4  0.3 - 1.2 mg/dL    GFR calc non Af Amer 38 (*) >90 mL/min    GFR calc Af Amer 44 (*) >90 mL/min   URINALYSIS, ROUTINE W REFLEX MICROSCOPIC     Status: Abnormal   Collection Time   03/02/12 12:38 AM      Component Value Range Comment   Color, Urine YELLOW  YELLOW    APPearance CLEAR  CLEAR    Specific Gravity, Urine 1.017  1.005 - 1.030    pH 6.5  5.0 - 8.0    Glucose, UA NEGATIVE  NEGATIVE mg/dL    Hgb urine dipstick NEGATIVE  NEGATIVE    Bilirubin Urine NEGATIVE  NEGATIVE    Ketones, ur NEGATIVE  NEGATIVE mg/dL    Protein, ur NEGATIVE  NEGATIVE mg/dL    Urobilinogen, UA 0.2  0.0 - 1.0 mg/dL     Nitrite NEGATIVE  NEGATIVE    Leukocytes, UA TRACE (*) NEGATIVE   URINE MICROSCOPIC-ADD ON     Status: Normal   Collection Time   03/02/12 12:38 AM      Component Value Range Comment   Squamous Epithelial / LPF RARE  RARE    WBC, UA 0-2  <3 WBC/hpf    Dg Chest 2 View  03/02/2012  *RADIOLOGY REPORT*  Clinical Data: Fever.  CHEST - 2 VIEW  Comparison: 02/10/2012.  Findings: Poor inspiration with crowding of the pulmonary vasculature  and interstitial markings.  Grossly clear lungs.  The cardiac silhouette remains grossly normal in size.  Diffuse osteopenia.  IMPRESSION: No acute abnormality.  Poor inspiration.   Original Report Authenticated By: Darrol Angel, M.D.    Ct Head Wo Contrast  03/02/2012  *RADIOLOGY REPORT*  Clinical Data: Headache and weakness.  History of seizure disorder, aneurysm, and breast cancer.  CT HEAD WITHOUT CONTRAST  Technique:  Contiguous axial images were obtained from the base of the skull through the vertex without contrast.  Comparison: MRI brain 02/27/2012. CT head 02/27/2012.  Findings: Encephalomalacia in the left posterior parietal region and both occipital lobes consistent with old infarcts. Calcification in the right occipital region.  Diffuse cerebral atrophy.  Ventricular dilatation consistent with central atrophy. Low attenuation change in the deep white matter consistent with small vessel ischemic change.  No mass effect or midline shift.  No abnormal extra-axial fluid collections.  Basal cisterns are not effaced.  No evidence of acute intracranial hemorrhage.  No significant change since previous study.  No depressed skull fractures.  Visualized paranasal sinuses and mastoid air cells are not opacified.  Vascular calcifications.  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 Chronic atrophy and small vessel ischemic change.  No acute intracranial abnormalities.   Original Report Authenticated By: Marlon Pel, M.D.      Review of Systems  Constitutional: Negative.   HENT: Negative.   Eyes: Negative.   Respiratory: Negative.   Cardiovascular: Negative.   Gastrointestinal: Negative.   Genitourinary: Negative.   Musculoskeletal: Negative.   Skin: Negative.   Neurological:       Confusion and unresponsive.  Endo/Heme/Allergies: Negative.   Psychiatric/Behavioral: Negative.     Blood pressure 127/72, pulse 62, temperature 97.7 F (36.5 C), temperature source Oral, resp. rate 16, height 5\' 2"  (1.575 m), weight 74.8 kg (164 lb 14.5 oz), SpO2 96.00%. Physical Exam  Constitutional: She appears well-developed and well-nourished. No distress.  HENT:  Head: Normocephalic and atraumatic.  Right Ear: External ear normal.  Left Ear: External ear normal.  Nose: Nose normal.  Mouth/Throat: Oropharynx is clear and moist. No oropharyngeal exudate.  Eyes:       Blind in both eyes.  Neck: Normal range of motion. Neck supple.  Cardiovascular: Normal rate and regular rhythm.   Respiratory: Effort normal and breath sounds normal. No respiratory distress. She has no wheezes. She has no rales.  GI: Soft. Bowel sounds are normal. She exhibits no distension. There is no tenderness. There is no rebound.  Musculoskeletal: Normal range of motion. She exhibits no edema and no tenderness.       Right leg in dressing.  Neurological: She is alert.       Oriented to her name and follows commands. Moves all extremities.  Skin: She is not diaphoretic.     Assessment/Plan #1. Acute encephalopathy - causes not clear. MRI recently done was negative for any acute and CT head done today is negative for anything acute. Ammonia levels are pending. Since patient has a history of seizure I have ordered an EEG. I have also consulted neurologist for opinion. Continue Keppra for now. Check ABG since patient has history of COPD to check for any carbon dioxide narcosis less likely. #2. Chronic right leg wound - on antibiotics. Consult  wound team. Please confirm patient's antibiotics in a.m., as patient on Zithromax. #3. History of DVT on Coumadin - Coumadin per pharmacy. #4. Chronic kidney disease - creatinine at his  baseline. Continue to monitor. #5. COPD - presently not wheezing. Continue inhalers. #6. Hypothyroidism - continue Synthroid. Check TSH. #7. Hypertension - continue present medications.  CODE STATUS - full code.  KAKRAKANDY,ARSHAD N. 03/02/2012, 5:43 AM

## 2012-03-02 NOTE — ED Provider Notes (Signed)
History     CSN: [id number]  Arrival date & time 03/01/12  2307   First MD Initiated Contact with Patient 03/02/12 0039      Chief Complaint  Patient presents with  . Fatigue    HPI Daughter insisted EMS be called d/t unresponsive. Pt was sleeping upon arrival of EMS and sat up straight to a light sternal rub. CBG - 98 PTA. Pt needed no other treatment PTA. Pt alert, oriented, and had no complaints.Just released from hospital  Past Medical History  Diagnosis Date  . Hypertension   . Hypothyroidism   . Seizure disorder   . History of cerebrovascular accident   . Severe stage glaucoma     legally blind  . Depression with anxiety   . OAB (overactive bladder)   . ADENOCARCINOMA, LEFT BREAST 04/11/2010    s/p L mastectomy, on femara x 73yr  . BACK PAIN, LUMBAR, CHRONIC   . DVT 05/2007    RLE following R THR - chronic coumadin, chronic RLE pain  . GLAUCOMA   . OSTEOARTHRITIS, HANDS, BILATERAL   . OSTEOPENIA   . Retinal ischemia   . Sciatica of right side     chronic RLE pain, multifactorial - MRI T/L spine 02/2011  . VITAMIN D DEFICIENCY dx 08/2008  . Venous ulcer of right lower extremity with varicose veins   . Dementia     Past Surgical History  Procedure Date  . Total abdominal hysterectomy   . Total hip arthroplasty 10/2006    right hip  . Refractive surgery     B/L  . Breast surgery 03/2010    breast biopsy, L mastectomy   . Tonsillectomy     Family History  Problem Relation Age of Onset  . Ovarian cancer Mother   . Cancer Mother   . Deep vein thrombosis Mother   . Heart disease Mother   . Arthritis Other     grandparents  . Lung cancer Other   . Heart disease Other     parent  . Cancer Father     BRAIN TUMOR  . Cancer Brother   . Hyperlipidemia Brother   . Hypertension Brother   . Stroke Brother   . Arthritis Brother   . Diabetes Daughter   . Heart disease Daughter   . Hypertension Daughter   . Hyperlipidemia Daughter   . Other Daughter    VARICOSE VEINS    History  Substance Use Topics  . Smoking status: Former Smoker -- 45 years    Types: Cigarettes    Quit date: 10/16/1979  . Smokeless tobacco: Never Used  . Alcohol Use: No    OB History    Grav Para Term Preterm Abortions TAB SAB Ect Mult Living                  Review of Systems  Unable to perform ROS: Mental status change    Allergies  Morphine sulfate; Penicillins; and Sertraline hcl  Home Medications  No current outpatient prescriptions on file.  BP 126/60  Pulse 60  Temp 97.9 F (36.6 C) (Oral)  Resp 15  Ht 5\' 2"  (1.575 m)  Wt 164 lb 14.5 oz (74.8 kg)  BMI 30.16 kg/m2  SpO2 98%  Physical Exam  Nursing note and vitals reviewed. Constitutional: She appears well-developed. She appears listless. She has a sickly appearance. No distress.  HENT:  Head: Normocephalic and atraumatic.  Eyes: Pupils are equal, round, and reactive to light.  Neck: Normal  range of motion.  Cardiovascular: Normal rate and intact distal pulses.   Pulmonary/Chest: No respiratory distress.  Abdominal: Normal appearance. She exhibits no distension.  Musculoskeletal: Normal range of motion.  Neurological: She appears listless. No cranial nerve deficit. GCS eye subscore is 3. GCS verbal subscore is 5. GCS motor subscore is 6.  Skin: Skin is warm and dry. No rash noted.    ED Course  Procedures (including critical care time)  Labs Reviewed  CBC WITH DIFFERENTIAL - Abnormal; Notable for the following:    RDW 15.6 (*)     All other components within normal limits  COMPREHENSIVE METABOLIC PANEL - Abnormal; Notable for the following:    Glucose, Bld 107 (*)     Creatinine, Ser 1.24 (*)     Albumin 2.8 (*)     GFR calc non Af Amer 38 (*)     GFR calc Af Amer 44 (*)     All other components within normal limits  URINALYSIS, ROUTINE W REFLEX MICROSCOPIC - Abnormal; Notable for the following:    Leukocytes, UA TRACE (*)     All other components within normal limits    COMPREHENSIVE METABOLIC PANEL - Abnormal; Notable for the following:    Creatinine, Ser 1.20 (*)     Albumin 2.8 (*)     GFR calc non Af Amer 40 (*)     GFR calc Af Amer 46 (*)     All other components within normal limits  CBC WITH DIFFERENTIAL - Abnormal; Notable for the following:    RDW 15.8 (*)     All other components within normal limits  BLOOD GAS, ARTERIAL - Abnormal; Notable for the following:    pO2, Arterial 79.9 (*)     Bicarbonate 26.6 (*)     All other components within normal limits  PROTIME-INR - Abnormal; Notable for the following:    Prothrombin Time 22.2 (*)     INR 1.91 (*)     All other components within normal limits  TSH - Abnormal; Notable for the following:    TSH 16.576 (*)     All other components within normal limits  URINE MICROSCOPIC-ADD ON  AMMONIA  MRSA PCR SCREENING   Dg Chest 2 View  03/02/2012  *RADIOLOGY REPORT*  Clinical Data: Fever.  CHEST - 2 VIEW  Comparison: 02/10/2012.  Findings: Poor inspiration with crowding of the pulmonary vasculature and interstitial markings.  Grossly clear lungs.  The cardiac silhouette remains grossly normal in size.  Diffuse osteopenia.  IMPRESSION: No acute abnormality.  Poor inspiration.   Original Report Authenticated By: Darrol Angel, M.D.    Ct Head Wo Contrast  03/02/2012  *RADIOLOGY REPORT*  Clinical Data: Headache and weakness.  History of seizure disorder, aneurysm, and breast cancer.  CT HEAD WITHOUT CONTRAST  Technique:  Contiguous axial images were obtained from the base of the skull through the vertex without contrast.  Comparison: MRI brain 02/27/2012. CT head 02/27/2012.  Findings: Encephalomalacia in the left posterior parietal region and both occipital lobes consistent with old infarcts. Calcification in the right occipital region.  Diffuse cerebral atrophy.  Ventricular dilatation consistent with central atrophy. Low attenuation change in the deep white matter consistent with small vessel ischemic  change.  No mass effect or midline shift.  No abnormal extra-axial fluid collections.  Basal cisterns are not effaced.  No evidence of acute intracranial hemorrhage.  No significant change since previous study.  No depressed skull fractures.  Visualized paranasal sinuses  and mastoid air cells are not opacified.  Vascular calcifications.  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 Chronic atrophy and small vessel ischemic change.  No acute intracranial abnormalities.   Original Report Authenticated By: Marlon Pel, M.D.      1. Altered mental state   2. ADENOCARCINOMA, LEFT BREAST   3. Other screening mammogram   4. Acute encephalopathy   5. Cerebral vascular accident   6. Seizure   7. Unspecified essential hypertension       MDM          Nelia Shi, MD 03/02/12 1555

## 2012-03-03 DIAGNOSIS — M542 Cervicalgia: Secondary | ICD-10-CM

## 2012-03-03 DIAGNOSIS — R4182 Altered mental status, unspecified: Secondary | ICD-10-CM

## 2012-03-03 DIAGNOSIS — E039 Hypothyroidism, unspecified: Secondary | ICD-10-CM

## 2012-03-03 LAB — PROTIME-INR: INR: 2.05 — ABNORMAL HIGH (ref 0.00–1.49)

## 2012-03-03 MED ORDER — WARFARIN SODIUM 2.5 MG PO TABS
2.5000 mg | ORAL_TABLET | Freq: Once | ORAL | Status: AC
  Administered 2012-03-03: 2.5 mg via ORAL
  Filled 2012-03-03: qty 1

## 2012-03-03 NOTE — Progress Notes (Signed)
Subjective: Patient with daughter in room today.  Reports that patient is at baseline during the day-only notes some confusion at night and early morning with awakening.  This is not usual for her.  Has not had any further seizures.  Seems to be tolerating the increase in Keppra.    Complains of neck pain radiating to her right arm.  This has been an intermittent issue for her even prior to admission.   Objective: Current vital signs: BP 106/63  Pulse 67  Temp 98.4 F (36.9 C) (Oral)  Resp 20  Ht 5\' 2"  (1.575 m)  Wt 75.3 kg (166 lb 0.1 oz)  BMI 30.36 kg/m2  SpO2 96% Vital signs in last 24 hours: Temp:  [97.3 F (36.3 C)-98.4 F (36.9 C)] 98.4 F (36.9 C) (09/11 0456) Pulse Rate:  [67-84] 67  (09/11 0456) Resp:  [18-20] 20  (09/11 0456) BP: (106-128)/(63-71) 106/63 mmHg (09/11 0456) SpO2:  [94 %-98 %] 96 % (09/11 1240) Weight:  [75.3 kg (166 lb 0.1 oz)] 75.3 kg (166 lb 0.1 oz) (09/11 0500)  Intake/Output from previous day:   Intake/Output this shift: Total I/O In: -  Out: 350 [Urine:350] Nutritional status: Cardiac  Neurologic Exam: Mental Status: Alert, oriented to person and place, thought content appropriate.  Speech fluent without evidence of aphasia.  Able to follow 3 step commands without difficulty. Cranial Nerves: II: Discs flat bilaterally; Patient blind.  Pupils equal  III,IV, VI: ptosis not present, extra-ocular motions intact bilaterally V,VII: smile symmetric, facial light touch sensation normal bilaterally VIII: hearing normal bilaterally IX,X: gag reflex present XI: bilateral shoulder shrug XII: midline tongue extension Motor: Right : Upper extremity   5/5    Left:     Upper extremity   5/5  Lower extremity   5/5     Lower extremity   5/5 Tone and bulk:normal tone throughout; no atrophy noted Sensory: Pinprick and light touch intact throughout, bilaterally Deep Tendon Reflexes: 2+ and symmetric throughout Plantars: Right: mute  Left:  mute Cerebellar: Unable to test due to vision.      Lab Results: Basic Metabolic Panel:  Lab 03/02/12 4696 03/02/12 0005 02/29/12 0433 02/28/12 0515 02/27/12 1548  NA 139 138 140 142 142  K 3.7 4.2 3.5 3.4* 3.5  CL 104 104 105 106 105  CO2 27 29 27 28 29   GLUCOSE 95 107* 94 100* 90  BUN 15 15 16 11 14   CREATININE 1.20* 1.24* 1.15* 1.11* 1.12*  CALCIUM 8.6 8.7 8.8 -- --  MG -- -- -- -- 2.1  PHOS -- -- -- -- 1.9*    Liver Function Tests:  Lab 03/02/12 0530 03/02/12 0005  AST 18 27  ALT 15 15  ALKPHOS 45 44  BILITOT 0.3 0.4  PROT 6.2 6.4  ALBUMIN 2.8* 2.8*   No results found for this basename: LIPASE:5,AMYLASE:5 in the last 168 hours  Lab 03/02/12 0530  AMMONIA 21    CBC:  Lab 03/02/12 0530 03/02/12 0005 02/28/12 0515 02/27/12 1548  WBC 10.1 10.5 7.8 8.0  NEUTROABS 4.9 6.0 -- 3.5  HGB 12.3 12.7 12.1 13.0  HCT 37.0 37.2 36.7 38.8  MCV 80.8 80.2 81.4 81.3  PLT 182 184 188 204    Cardiac Enzymes:  Lab 02/27/12 1548  CKTOTAL --  CKMB --  CKMBINDEX --  TROPONINI <0.30    Lipid Panel:  Lab 02/28/12 0515  CHOL 249*  TRIG 130  HDL 55  CHOLHDL 4.5  VLDL 26  LDLCALC  168*    CBG: No results found for this basename: GLUCAP:5 in the last 168 hours  Microbiology: Results for orders placed during the hospital encounter of 03/01/12  MRSA PCR SCREENING     Status: Normal   Collection Time   03/24/2012  6:54 AM      Component Value Range Status Comment   MRSA by PCR NEGATIVE  NEGATIVE Final     Coagulation Studies:  Basename 03/03/12 0445 03/24/2012 0633 03/01/12 0425  LABPROT 23.5* 22.2* 22.5*  INR 2.05* 1.91* 1.94*    Imaging: Dg Chest 2 View  2012/03/24  *RADIOLOGY REPORT*  Clinical Data: Fever.  CHEST - 2 VIEW  Comparison: 02/10/2012.  Findings: Poor inspiration with crowding of the pulmonary vasculature and interstitial markings.  Grossly clear lungs.  The cardiac silhouette remains grossly normal in size.  Diffuse osteopenia.  IMPRESSION: No acute  abnormality.  Poor inspiration.   Original Report Authenticated By: Darrol Angel, M.D.    Ct Head Wo Contrast  2012-03-24  *RADIOLOGY REPORT*  Clinical Data: Headache and weakness.  History of seizure disorder, aneurysm, and breast cancer.  CT HEAD WITHOUT CONTRAST  Technique:  Contiguous axial images were obtained from the base of the skull through the vertex without contrast.  Comparison: MRI brain 02/27/2012. CT head 02/27/2012.  Findings: Encephalomalacia in the left posterior parietal region and both occipital lobes consistent with old infarcts. Calcification in the right occipital region.  Diffuse cerebral atrophy.  Ventricular dilatation consistent with central atrophy. Low attenuation change in the deep white matter consistent with small vessel ischemic change.  No mass effect or midline shift.  No abnormal extra-axial fluid collections.  Basal cisterns are not effaced.  No evidence of acute intracranial hemorrhage.  No significant change since previous study.  No depressed skull fractures.  Visualized paranasal sinuses and mastoid air cells are not opacified.  Vascular calcifications.  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 Chronic atrophy and small vessel ischemic change.  No acute intracranial abnormalities.   Original Report Authenticated By: Marlon Pel, M.D.     Medications:  I have reviewed the patient's current medications. Scheduled:   . azithromycin  500 mg Oral Daily  . brimonidine  1 drop Both Eyes BID  . cholecalciferol  1,000 Units Oral Daily  . docusate sodium  100 mg Oral BID  . dorzolamide-timolol  1 drop Both Eyes BID  . fentaNYL  12.5 mcg Transdermal Q72H  . Fluticasone-Salmeterol  1 puff Inhalation BID  . gabapentin  900 mg Oral BID  . hydrocortisone cream   Topical TID  . irbesartan  75 mg Oral Daily  . latanoprost  1 drop Both Eyes QHS  . levETIRAcetam  1,000 mg Oral QHS  . levothyroxine  88 mcg Oral Daily  .  multivitamin with minerals  1 tablet Oral Daily  . NIFEdipine  30 mg Oral Daily  . pantoprazole  40 mg Oral Q1200  . pilocarpine  1 drop Both Eyes TID  . sodium chloride  3 mL Intravenous Q12H  . sodium chloride  3 mL Intravenous Q12H  . tiotropium  18 mcg Inhalation Daily  . warfarin  2.5 mg Oral ONCE-1800  . warfarin  5 mg Oral ONCE-1800  . Warfarin - Pharmacist Dosing Inpatient   Does not apply q1800    Assessment/Plan:  Patient Active Hospital Problem List: Acute encephalopathy (2012/03/24)   Assessment: Patient seems to have returned to baseline with only some sundowning noted  at this time.  This is not unexpected wit her age and visual deficits.     Plan: No further work up required.   SEIZURE DISORDER (01/15/2007)   Assessment: No further seizure activity noted.  Patient tolerating increase in Keppra.     Plan: Continue Keppra at current dose Neck pain   Assessment:  Patient has been dealing with this issue intermittently and even prior to this admission.  Reports taking Tylenol for this at home.  No weakness or sensory findings on exam that would suggest the need for further intervention.     Plan:  Patient may benefit from Voltaren Gel to help with her pain.  May be used prn.      LOS: 2 days   Thana Farr, MD [facility] [phone] 03/03/2012  2:18 PM

## 2012-03-03 NOTE — Progress Notes (Signed)
ANTICOAGULATION CONSULT NOTE - Follow Up Consult  Pharmacy Consult for Coumadin Indication: h/o DVT  Allergies  Allergen Reactions  . Morphine Sulfate     REACTION: rash: ITCHING  . Penicillins     REACTION: urticaria (hives)  . Sertraline Hcl     REACTION: rash    Patient Measurements: Height: 5\' 2"  (157.5 cm) Weight: 166 lb 0.1 oz (75.3 kg) IBW/kg (Calculated) : 50.1   Vital Signs: Temp: 98.4 F (36.9 C) (09/11 0456) Temp src: Oral (09/11 0456) BP: 106/63 mmHg (09/11 0456) Pulse Rate: 67  (09/11 0456)  Labs:  Basename 03/03/12 0445 03/02/12 0633 03/02/12 0530 03/02/12 0005 03/01/12 0425  HGB -- -- 12.3 12.7 --  HCT -- -- 37.0 37.2 --  PLT -- -- 182 184 --  APTT -- -- -- -- --  LABPROT 23.5* 22.2* -- -- 22.5*  INR 2.05* 1.91* -- -- 1.94*  HEPARINUNFRC -- -- -- -- --  CREATININE -- -- 1.20* 1.24* --  CKTOTAL -- -- -- -- --  CKMB -- -- -- -- --  TROPONINI -- -- -- -- --    Estimated Creatinine Clearance: 32 ml/min (by C-G formula based on Cr of 1.2).  Assessment:  88 yof on chronic anticoagulation with Coumadin for h/o DVT.  Patient was reportedly on Coumadin 5mg  daily except 2.5 mg on Wed/Fri. Patient missed 9/9 dose, coumadin resumed 9/10.   INR today 2.05.  CBC ok 9/10, no bleeding documented  Drug-drug interaction noted with Azithromycin - will monitor  Goal of Therapy:  INR 2-3 Monitor platelets by anticoagulation protocol: Yes   Plan:   Coumadin 2.5mg  po x 1 tonight  Daily PT/INR   Pharmacy will f/u  Geoffry Paradise Thi 03/03/2012,10:58 AM

## 2012-03-03 NOTE — Evaluation (Addendum)
Physical Therapy Evaluation Patient Details Name: Jennifer Brennan MRN: [id number] DOB: 28-Jan-1924 Today's Date: 03/03/2012 Time: 4696-2952 PT Time Calculation (min): 31 min  PT Assessment / Plan / Recommendation Clinical Impression  Pt with recent D/C on 9/10 with readmission on same day due to unrepsonsiveness/altered mental status.  History of chronic anticoagulation and open wound on RLE.  Wound is now wraped in unna boot to protect.  Pt presents today with increased confusion during session. Pt will continue to benefit from skilled PT in acute venue to address deficits and prepare pt for next level of care.  PT recommends SNF for follow up therapy to increase pt safety and decrease burden of care.     PT Assessment  Patient needs continued PT services    Follow Up Recommendations  Skilled nursing facility    Barriers to Discharge Decreased caregiver support      Equipment Recommendations  Defer to next venue;None recommended by PT    Recommendations for Other Services     Frequency Min 3X/week    Precautions / Restrictions Precautions Precautions: Fall Precaution Comments: Pt is blind and has wound on R distal LE.  Restrictions Weight Bearing Restrictions: No   Pertinent Vitals/Pain No pain      Mobility  Bed Mobility Bed Mobility: Supine to Sit Supine to Sit: 1: +2 Total assist;HOB elevated Supine to Sit: Patient Percentage: 10% Sit to Supine: 1: +1 Total assist Details for Bed Mobility Assistance: Assist for B LEs off of bed and for trunk to attain sitting position with verbal and manual cuing for hand placement and technique.  Noted pt to be very fatigued initially during session.  Used pad to assist with scooting.  Transfers Transfers: Sit to Stand;Stand to Sit Sit to Stand: 1: +2 Total assist;From elevated surface;With upper extremity assist;From bed Sit to Stand: Patient Percentage: 60% Stand to Sit: 1: +2 Total assist;With upper extremity assist;With  armrests;To chair/3-in-1 Stand to Sit: Patient Percentage: 60% Stand Pivot Transfers: 1: +2 Total assist Stand Pivot Transfers: Patient Percentage: 60% Details for Transfer Assistance: Assist to rise and guide throughout mobility with verbal and manual cuing for hand placement and safety when sitting/standing.  Noted pt able to weight shift and advance LEs better than previous session.  Performed transfer x 2 in order to use 3in1 at bedside.  Ambulation/Gait Ambulation/Gait Assistance: 1: +2 Total assist Ambulation/Gait: Patient Percentage: 60% Ambulation Distance (Feet): 4 Feet Assistive device: Rolling walker Ambulation/Gait Assistance Details: took some steps from bed to 3in1 then to chair with verbal and manual cuing for RW placement and positioning due to pt is legally blind.  maintained pt with 2LO2 with sats at 94-96% for all mobility.  Gait Pattern: Step-to pattern;Decreased stride length;Trunk flexed;Narrow base of support Gait velocity: decreased Stairs: No Wheelchair Mobility Wheelchair Mobility: No    Exercises     PT Diagnosis: Difficulty walking;Generalized weakness  PT Problem List: Decreased strength;Decreased range of motion;Decreased activity tolerance;Decreased balance;Decreased mobility;Decreased knowledge of use of DME;Decreased cognition;Decreased skin integrity PT Treatment Interventions: DME instruction;Functional mobility training;Gait training;Therapeutic activities;Therapeutic exercise;Balance training;Patient/family education   PT Goals Acute Rehab PT Goals PT Goal Formulation: With patient Time For Goal Achievement: 03/17/12 Potential to Achieve Goals: Fair Pt will go Supine/Side to Sit: with min assist PT Goal: Supine/Side to Sit - Progress: Goal set today Pt will go Sit to Supine/Side: with min assist PT Goal: Sit to Supine/Side - Progress: Goal set today Pt will go Sit to Stand: with supervision PT Goal:  Sit to Stand - Progress: Goal set today Pt will  Transfer Bed to Chair/Chair to Bed: with min assist PT Transfer Goal: Bed to Chair/Chair to Bed - Progress: Goal set today Pt will Ambulate: 16 - 50 feet;with min assist;with least restrictive assistive device PT Goal: Ambulate - Progress: Goal set today  Visit Information  Last PT Received On: 03/03/12 Assistance Needed: +2 PT/OT Co-Evaluation/Treatment: Yes    Subjective Data  Subjective: Are we going to the game?   Prior Functioning  Home Living Lives With: Spouse Available Help at Discharge: Skilled Nursing Facility Type of Home: House Home Access: Stairs to enter [employer] of Steps: 5 Entrance Stairs-Rails: Left Home Layout: Able to live on main level with bedroom/bathroom;Two level Bathroom Shower/Tub: Health visitor: Handicapped height Home Adaptive Equipment: Shower chair without back;Bedside commode/3-in-1;Grab bars in shower;Walker - rolling;Straight cane;Quad cane Prior Function Level of Independence: Needs assistance Driving: No Vocation: Retired Musician: No difficulties    Cognition  Overall Cognitive Status: History of cognitive impairments - further impaired Arousal/Alertness: Lethargic Orientation Level: Disoriented to;Place;Time;Situation Behavior During Session: WFL for tasks performed Cognition - Other Comments: Pt very confused during session, noted pt was sleeping deeply upon therapy arrival.      Extremity/Trunk Assessment Right Lower Extremity Assessment RLE ROM/Strength/Tone: Deficits RLE ROM/Strength/Tone Deficits: Generalized weakness, grossly 3/5 per functional assessment.  Left Lower Extremity Assessment LLE ROM/Strength/Tone: Deficits LLE ROM/Strength/Tone Deficits: Generalized weakness grossly 3/5 per functional assessment   Balance Dynamic Sitting Balance Dynamic Sitting - Balance Support: Bilateral upper extremity supported;Feet supported Dynamic Sitting - Level of Assistance: 5: Stand by  assistance;4: Min assist Dynamic Sitting - Comments: Pt sat EOB x approx 6-8 mins while daughter fixed pts hair.  Requires intermittent min assist due to posterior leaning with cues for correcting.   End of Session PT - End of Session Equipment Utilized During Treatment: Gait belt Activity Tolerance: Patient tolerated treatment well Patient left: in chair;with call bell/phone within reach;with family/visitor present Nurse Communication: Mobility status  GP     Brennan, Jennifer Mattes 03/03/2012, 9:27 AM

## 2012-03-03 NOTE — Progress Notes (Signed)
Fentanyl patch removed. Was placed on 9/6 and should be removed q72 hours. Julio Sicks RN

## 2012-03-03 NOTE — Progress Notes (Signed)
[facility] PROGRESS NOTE  Jennifer Brennan:[id number] DOB: 1924/03/06 DOA: 03/01/2012 PCP: [name] Paci, MD  Assessment/Plan: 1-Acute encephalopathy:concerns for association with absent seizure vs uncontrolled hypothyroidism. Will repeat TSH and free T4; depending results will adjust her synthroid. Will follow neurology recommendations and continue adjusted keppra dose. Patient no longer confuse and w/o any further seizure. Will also avoid extra narcotics by mouth to decrease contribution from meds to AMS.  2-HYPERTENSION: continue home regimen.  3-SEIZURE DISORDER: no further seizure or post ictal. Continue Keppra.  4-Venous ulcer of right lower extremity with varicose veins: continue wound care.  5-Neck pain: no new and most likely associated with bursitis. Continue tylenol prn for pain.  6-hypothyroidism: will continue synthroid and adjust dose depending on her labs results.  7-COPD: stable continue home inhalers.  8-CRF: continue monitoring. Renal function at baseline.   Code Status: Full Family Communication: daughter at bedside Disposition Plan: SNF for physical conditioning and rehabilitation   Brief narrative: 78 y/o female with pmh significant for HTN, hypothyroidism and sizure disorder admitted secondary to AMS.  Consultants:  Neurology  Antibiotics:  none  HPI/Subjective: Afebrile, no CP, no SOB; No further seizure activity reported. Patient mentation back to baseline.  Objective: Filed Vitals:   03/03/12 1240 03/03/12 1300 03/03/12 2147 03/03/12 2227  BP:  119/76 150/65   Pulse:  68 66   Temp:  98.5 F (36.9 C) 97.3 F (36.3 C)   TempSrc:   Oral   Resp:  18 18   Height:      Weight:      SpO2: 96% 99% 93% 95%    Intake/Output Summary (Last 24 hours) at 03/03/12 2306 Last data filed at 03/03/12 1700  Gross per 24 hour  Intake    480 ml  Output    350 ml  Net    130 ml   Filed Weights   03/02/12 0510 03/03/12 0500  Weight: 74.8  kg (164 lb 14.5 oz) 75.3 kg (166 lb 0.1 oz)    Exam:   General:  AAOX3, NAD  Cardiovascular: regular rate; no rubs or gallops  Respiratory: CTA bilaterally  Abdomen: soft, NT, ND, positive BS  Extremities, no cyanosis or clubbing  Neuro: no further seizure, no focal deficit.  Data Reviewed: Basic Metabolic Panel:  Lab 03/02/12 9562 03/02/12 0005 02/29/12 0433 02/28/12 0515 02/27/12 1548  NA 139 138 140 142 142  K 3.7 4.2 3.5 3.4* 3.5  CL 104 104 105 106 105  CO2 27 29 27 28 29   GLUCOSE 95 107* 94 100* 90  BUN 15 15 16 11 14   CREATININE 1.20* 1.24* 1.15* 1.11* 1.12*  CALCIUM 8.6 8.7 8.8 9.0 9.2  MG -- -- -- -- 2.1  PHOS -- -- -- -- 1.9*   Liver Function Tests:  Lab 03/02/12 0530 03/02/12 0005  AST 18 27  ALT 15 15  ALKPHOS 45 44  BILITOT 0.3 0.4  PROT 6.2 6.4  ALBUMIN 2.8* 2.8*    Lab 03/02/12 0530  AMMONIA 21   CBC:  Lab 03/02/12 0530 03/02/12 0005 02/28/12 0515 02/27/12 1548  WBC 10.1 10.5 7.8 8.0  NEUTROABS 4.9 6.0 -- 3.5  HGB 12.3 12.7 12.1 13.0  HCT 37.0 37.2 36.7 38.8  MCV 80.8 80.2 81.4 81.3  PLT 182 184 188 204   Cardiac Enzymes:  Lab 02/27/12 1548  CKTOTAL --  CKMB --  CKMBINDEX --  TROPONINI <0.30   BNP (last 3 results)  Basename 02/10/12 2215  PROBNP  547.6*    Recent Results (from the past 240 hour(s))  MRSA PCR SCREENING     Status: Normal   Collection Time   03/02/12  6:54 AM      Component Value Range Status Comment   MRSA by PCR NEGATIVE  NEGATIVE Final      Studies: Dg Chest 2 View  03/02/2012  *RADIOLOGY REPORT*  Clinical Data: Fever.  CHEST - 2 VIEW  Comparison: 02/10/2012.  Findings: Poor inspiration with crowding of the pulmonary vasculature and interstitial markings.  Grossly clear lungs.  The cardiac silhouette remains grossly normal in size.  Diffuse osteopenia.  IMPRESSION: No acute abnormality.  Poor inspiration.   Original Report Authenticated By: Darrol Angel, M.D.    Ct Head Wo Contrast  03/02/2012   *RADIOLOGY REPORT*  Clinical Data: Headache and weakness.  History of seizure disorder, aneurysm, and breast cancer.  CT HEAD WITHOUT CONTRAST  Technique:  Contiguous axial images were obtained from the base of the skull through the vertex without contrast.  Comparison: MRI brain 02/27/2012. CT head 02/27/2012.  Findings: Encephalomalacia in the left posterior parietal region and both occipital lobes consistent with old infarcts. Calcification in the right occipital region.  Diffuse cerebral atrophy.  Ventricular dilatation consistent with central atrophy. Low attenuation change in the deep white matter consistent with small vessel ischemic change.  No mass effect or midline shift.  No abnormal extra-axial fluid collections.  Basal cisterns are not effaced.  No evidence of acute intracranial hemorrhage.  No significant change since previous study.  No depressed skull fractures.  Visualized paranasal sinuses and mastoid air cells are not opacified.  Vascular calcifications.  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 Chronic atrophy and small vessel ischemic change.  No acute intracranial abnormalities.   Original Report Authenticated By: Marlon Pel, M.D.     Scheduled Meds:   . azithromycin  500 mg Oral Daily  . brimonidine  1 drop Both Eyes BID  . cholecalciferol  1,000 Units Oral Daily  . docusate sodium  100 mg Oral BID  . dorzolamide-timolol  1 drop Both Eyes BID  . fentaNYL  12.5 mcg Transdermal Q72H  . Fluticasone-Salmeterol  1 puff Inhalation BID  . gabapentin  900 mg Oral BID  . hydrocortisone cream   Topical TID  . irbesartan  75 mg Oral Daily  . latanoprost  1 drop Both Eyes QHS  . levETIRAcetam  1,000 mg Oral QHS  . levothyroxine  88 mcg Oral Daily  . multivitamin with minerals  1 tablet Oral Daily  . NIFEdipine  30 mg Oral Daily  . pantoprazole  40 mg Oral Q1200  . pilocarpine  1 drop Both Eyes TID  . sodium chloride  3 mL Intravenous Q12H   . sodium chloride  3 mL Intravenous Q12H  . tiotropium  18 mcg Inhalation Daily  . warfarin  2.5 mg Oral ONCE-1800  . Warfarin - Pharmacist Dosing Inpatient   Does not apply q1800     Time spent: > 30 minutes    Raynell Scott  [facility] Pager [phone]. If 8PM-8AM, please contact night-coverage at [email or web address], password [facility] 03/03/2012, 11:06 PM  LOS: 2 days

## 2012-03-03 NOTE — Evaluation (Signed)
Occupational Therapy Evaluation Patient Details Name: Jennifer Brennan MRN: [id number] DOB: Jan 26, 1924 Today's Date: 03/03/2012 Time: 5784-6962 OT Time Calculation (min): 31 min  OT Assessment / Plan / Recommendation Clinical Impression  This 78 year old female has been admitted 3 times recently, initially with cellulits and now immediately after d/c to snf for ams/unresponsiveness.  Pt is confused and legally blind but she participated well during eval today.  She will benefit from skilled OT to increase independence with ADLs with mod A level goals in acute.      OT Assessment  Patient needs continued OT Services    Follow Up Recommendations  Skilled nursing facility    Barriers to Discharge      Equipment Recommendations  Defer to next venue;None recommended by PT;3 in 1 bedside comode    Recommendations for Other Services    Frequency  Min 2X/week    Precautions / Restrictions Precautions Precautions: Fall Precaution Comments: Pt is blind and has wound on R distal LE.  Restrictions Weight Bearing Restrictions: No   Pertinent Vitals/Pain No c/o pain but typically has pain in RLE    ADL  Eating/Feeding: Simulated;Minimal assistance (full set up and daughter hands her utensils ) Where Assessed - Eating/Feeding: Chair Grooming: Performed;Wash/dry hands;Set up (pt perseverated:  assist to stop activity) Where Assessed - Grooming: Supported sitting Upper Body Bathing: Simulated;Minimal assistance (multimodal cues  for all adls) Where Assessed - Upper Body Bathing: Unsupported sitting Lower Body Bathing: +2 Total assistance Lower Body Bathing: Patient Percentage: 40% Where Assessed - Lower Body Bathing: Supported sit to stand Upper Body Dressing: Performed;Maximal assistance Where Assessed - Upper Body Dressing: Unsupported sitting Lower Body Dressing: Simulated;+2 Total assistance Lower Body Dressing: Patient Percentage: 10% Where Assessed - Lower Body Dressing: Supported  sit to stand Toilet Transfer: +2 Total assistance;Performed Toilet Transfer: Patient Percentage: 60% Statistician Method: Surveyor, minerals: Materials engineer and Hygiene: Performed;+2 Total assistance Toileting - Architect and Hygiene: Patient Percentage: 70% Where Assessed - Glass blower/designer Manipulation and Hygiene: Standing Transfers/Ambulation Related to ADLs: pt able to follow verbal commands (right/left) ADL Comments: Pt was sleeping soundly.  Aroused and pt maintained alertness.  Confused about location/situation    OT Diagnosis: Generalized weakness  OT Problem List: Decreased strength;Decreased activity tolerance;Decreased knowledge of use of DME or AE;Decreased cognition;Impaired balance (sitting and/or standing);Pain OT Treatment Interventions: Self-care/ADL training;Therapeutic activities;DME and/or AE instruction;Patient/family education;Therapeutic exercise   OT Goals Acute Rehab OT Goals OT Goal Formulation: With patient/family Time For Goal Achievement: 03/17/12 Potential to Achieve Goals: Good ADL Goals Pt Will Perform Grooming: with min assist;Standing at sink;Supported ADL Goal: Grooming - Progress: Goal set today Pt Will Transfer to Toilet: with mod assist;3-in-1;Stand pivot transfer ADL Goal: Statistician - Progress: Goal set today Pt Will Perform Toileting - Clothing Manipulation: with min assist;Standing ADL Goal: Toileting - Clothing Manipulation - Progress: Goal set today Pt Will Perform Toileting - Hygiene: with min assist;Sit to stand from 3-in-1/toilet ADL Goal: Toileting - Hygiene - Progress: Goal set today Additional ADL Goal #1: Pt will perform 1 set of 10 A/AAROM exercises to increase strength for adls ADL Goal: Additional Goal #1 - Progress: Goal set today  Visit Information  Last OT Received On: 03/03/12 Assistance Needed: +2 PT/OT Co-Evaluation/Treatment: Yes    Subjective  Data  Subjective: Aren't we in the car Patient Stated Goal: none stated:  family wants her stronger   Prior Functioning  Vision/Perception  Home Living  Lives With: Spouse Available Help at Discharge: Skilled Nursing Facility Type of Home: House Home Access: Stairs to enter [employer] of Steps: 5 Entrance Stairs-Rails: Left Home Layout: Able to live on main level with bedroom/bathroom;Two level Bathroom Shower/Tub: Health visitor: Handicapped height Home Adaptive Equipment: Shower chair without back;Bedside commode/3-in-1;Grab bars in shower;Walker - rolling;Straight cane;Quad cane Prior Function Level of Independence: Needs assistance Driving: No Vocation: Retired Musician: No difficulties Dominant Hand: Right      Cognition  Overall Cognitive Status: History of cognitive impairments - further impaired Area of Impairment: Attention;Memory Arousal/Alertness: Awake/alert Orientation Level: Disoriented to;Place;Time;Situation Behavior During Session: WFL for tasks performed Current Attention Level: Sustained (perseverates) Cognition - Other Comments: did not carry over orientation information about place    Extremity/Trunk Assessment Right Upper Extremity Assessment RUE ROM/Strength/Tone: WFL for tasks assessed RUE ROM/Strength/Tone Deficits:  (pt does lift arms to reach head, put in shirt ) Left Upper Extremity Assessment LUE ROM/Strength/Tone: WFL for tasks assessed LUE ROM/Strength/Tone Deficits: same as above   Mobility  Shoulder Instructions  Bed Mobility Bed Mobility: Supine to Sit Supine to Sit: 1: +2 Total assist;HOB elevated Supine to Sit: Patient Percentage: 10%  Transfers Sit to Stand: 1: +2 Total assist;From elevated surface;With upper extremity assist;From bed Sit to Stand: Patient Percentage: 60% Stand to Sit: 1: +2 Total assist;With upper extremity assist;With armrests;To chair/3-in-1 Stand to Sit:  Patient Percentage: 60% Details for Transfer Assistance: Assist to rise and guide throughout mobility with verbal and manual cuing for hand placement and safety when sitting/standing.  Noted pt able to weight shift and advance LEs better than previous session.  Performed transfer x 2 in order to use 3in1 at bedside.        Exercise     Balance Dynamic Sitting Balance Dynamic Sitting - Balance Support: Bilateral upper extremity supported;Feet supported Dynamic Sitting - Level of Assistance: 5: Stand by assistance;4: Min assist Dynamic Sitting - Comments: Pt sat EOB x approx 6-8 mins while daughter fixed pts hair.  Requires intermittent min assist due to posterior leaning with cues for correcting.    End of Session OT - End of Session Equipment Utilized During Treatment: Gait belt Activity Tolerance: Patient tolerated treatment well Patient left: in chair;with call bell/phone within reach;with family/visitor present  GO     Gerline Ratto 03/03/2012, 9:43 AM Marica Otter, OTR/L [phone] 03/03/2012

## 2012-03-04 DIAGNOSIS — I872 Venous insufficiency (chronic) (peripheral): Secondary | ICD-10-CM

## 2012-03-04 DIAGNOSIS — C50919 Malignant neoplasm of unspecified site of unspecified female breast: Secondary | ICD-10-CM

## 2012-03-04 DIAGNOSIS — H409 Unspecified glaucoma: Secondary | ICD-10-CM

## 2012-03-04 LAB — PROTIME-INR: Prothrombin Time: 24 seconds — ABNORMAL HIGH (ref 11.6–15.2)

## 2012-03-04 MED ORDER — LETROZOLE 2.5 MG PO TABS
2.5000 mg | ORAL_TABLET | Freq: Every day | ORAL | Status: DC
  Administered 2012-03-04: 2.5 mg via ORAL
  Filled 2012-03-04: qty 1

## 2012-03-04 MED ORDER — IRBESARTAN 75 MG PO TABS: 75.0000 mg | ORAL_TABLET | Freq: Every day | ORAL | Status: DC

## 2012-03-04 MED ORDER — WARFARIN SODIUM 5 MG PO TABS
5.0000 mg | ORAL_TABLET | Freq: Once | ORAL | Status: DC
  Filled 2012-03-04: qty 1

## 2012-03-04 MED ORDER — LEVOTHYROXINE SODIUM 125 MCG PO TABS: 125.0000 ug | ORAL_TABLET | Freq: Every day | ORAL | Status: DC

## 2012-03-04 MED ORDER — FENTANYL 12 MCG/HR TD PT72: 1.0000 | MEDICATED_PATCH | TRANSDERMAL | Status: DC

## 2012-03-04 MED ORDER — LEVOTHYROXINE SODIUM 125 MCG PO TABS
125.0000 ug | ORAL_TABLET | Freq: Every day | ORAL | Status: DC
  Filled 2012-03-04: qty 1

## 2012-03-04 MED ORDER — AZITHROMYCIN 500 MG PO TABS: 500.0000 mg | ORAL_TABLET | Freq: Every day | ORAL | Status: AC

## 2012-03-04 MED ORDER — ACETAMINOPHEN 325 MG PO TABS: 650.0000 mg | ORAL_TABLET | Freq: Four times a day (QID) | ORAL | Status: DC | PRN

## 2012-03-04 NOTE — Discharge Summary (Signed)
Physician Discharge Summary  Jennifer Brennan NWG:[id number] DOB: 29-Nov-1923 DOA: 03/01/2012  PCP: [name] Paci, MD  Admit date: 03/01/2012 Discharge date: 03/04/2012  Recommendations for Outpatient Follow-up:  1. Follow up with PCP (please follow TSH and free T4 in about 4 weeks and adjust thyroid medications as needed; please also follow patient basic metabolic panel in order to assess electrolytes and renal function. Make sure to review patient chronic medication regimen and make any adjustments as needed for her chronic medical conditions, which remains stable during this hospitalization)  Discharge Diagnoses:  Principal Problem:  *Acute encephalopathy Active Problems:  HYPERTENSION  SEIZURE DISORDER  Venous ulcer of right lower extremity with varicose veins  Neck pain   Discharge Condition: Stable and improved; patient mentation is back to baseline. No further episode of seizure or unresponsiveness has been appreciated during this admission. Medication has been adjusted and at the moment of discharge plan is for the patient to go to skilled nursing facility for physical rehabilitation.  Diet recommendation: Low-sodium heart healthy diet.  Filed Weights   03/02/12 0510 03/03/12 0500 03/04/12 0500  Weight: 74.8 kg (164 lb 14.5 oz) 75.3 kg (166 lb 0.1 oz) 74.3 kg (163 lb 12.8 oz)    History of present illness:  78 year old female who was just discharged yesterday after being brought up and worked up for altered mental status and MRI of the brain done during the admission was negative for any stroke was discharged to nursing home yesterday and when she takes there patient became unresponsive and was brought back to the ER. By the time she reached ER patient became more alert but still confused. At the time I examined patient is becoming more alert and follows commands. Patient's daughter who was at the bedside states that she has improved a lot from last evening. CT head is negative.  Patient is admitted for further management   Hospital Course:  1-Acute encephalopathy: concerns for association with absent seizure vs uncontrolled hypothyroidism vs medications (especially narcotics). TSH and Free T4 demonstarted uncontrolled hypothyroidism; dose has been adjusted. Per Neurology recommendations will continue Keppra 1000mg  qhs and patient PRN narcotics to take along with her fentanyl patch has been discontinue. Patient mentation return to baseline and at this point the plan is to discharge her back to SNF for physical rehab and will follow with PCP in 2 weeks.  2-Hypothyroidism: synthroid adjusted to 125 mcg daily (to be given on empty stomach and 30 minutes away from other meds); in 4 weeks PCP to repeat TSH and Free T4 and adjust thyroid meds as needed.  3-SEIZURE DISORDER: no further seizure or post ictal. Continue Keppra 1000 mg at bedtime and continue neurontin.Marland Kitchen   4-Venous ulcer of right lower extremity with varicose veins: continue wound care.   5-Neck pain: no new and most likely associated with bursitis. Continue tylenol prn for pain.   6-hypothyroidism: will continue synthroid and adjust dose depending on her labs results.   7-COPD: stable continue home inhalers.   8-CRF: continue monitoring. Renal function at baseline and stable  9-Hx of breast cancer: continue femara  10-Glaucoma: continue current eye drops  11-HYPERTENSION: continue home regimen.    *Rest of medical problems remains stable during this hospitalization and the plan is to continue current medication regimen; will follow with PCP for further medication adjustment for his chronic medical conditions.  Procedures:  CT scan of head  MRI  Consultations:  Neurology  Discharge Exam: Filed Vitals:   03/03/12 2227 03/04/12 0500 03/04/12  0610 03/04/12 0810  BP:   114/60   Pulse:   69   Temp:   97.7 F (36.5 C)   TempSrc:   Oral   Resp:   18   Height:      Weight:  74.3 kg (163 lb 12.8  oz)    SpO2: 95%  100% 97%    General: NAD; AAOX2 (at baseline for her dementia); afebrile Cardiovascular: RRR, no rubs or gallops Respiratory: CTA bilaterally Abdomen: soft, NT, ND, positive BS Neuro:AAOX2, no new focal deficit appreciated; no seizure.  Discharge Instructions  Discharge Orders    Future Appointments: Provider: Department: Dept Phone: Center:   03/15/2012 2:30 PM Newt Lukes, MD [facility] [phone] [facility]   03/26/2012 10:45 AM Tonny Bollman, MD [facility] [phone] [facility]   04/27/2012 1:00 PM Radene Gunning [facility] [phone] None   05/04/2012 2:15 PM Amy Allegra Grana, PA [facility] [phone] None   05/14/2012 3:30 PM [facility] [phone] [facility]   05/14/2012 4:00 PM Fransisco Hertz, MD [facility] [phone] [facility]     Future Orders Please Complete By Expires   Discharge instructions      Comments:   -Take medications as prescribed -arrange follow up with PCP in 2 weeks -Keep yourself well hydrated -Resume PT/OT at SNF for physical rehab -Follow a heart healthy diet.       Medication List     As of 03/04/2012  1:48 PM    STOP taking these medications         acetaminophen 650 MG suppository   Commonly known as: TYLENOL      diphenhydrAMINE 25 MG tablet   Commonly known as: BENADRYL      oxyCODONE-acetaminophen 5-325 MG per tablet   Commonly known as: PERCOCET/ROXICET      valsartan 80 MG tablet   Commonly known as: DIOVAN   Replaced by: irbesartan 75 MG tablet      TAKE these medications         acetaminophen 325 MG tablet   Commonly known as: TYLENOL   Take 2 tablets (650 mg total) by mouth every 6 (six) hours as needed for pain (or Fever >/= 101).      albuterol 108 (90 BASE) MCG/ACT inhaler   Commonly known as: PROVENTIL HFA;VENTOLIN HFA   Inhale 2 puffs into the lungs every 6 (six) hours as needed. For shortness of breath.      albuterol-ipratropium 18-103 MCG/ACT inhaler    Commonly known as: COMBIVENT   Inhale 1-2 puffs into the lungs every 6 (six) hours as needed for wheezing or shortness of breath.      azithromycin 500 MG tablet   Commonly known as: ZITHROMAX   Take 1 tablet (500 mg total) by mouth daily.      brimonidine 0.15 % ophthalmic solution   Commonly known as: ALPHAGAN   Place 1 drop into both eyes 2 (two) times daily.      cholecalciferol 1000 UNITS tablet   Commonly known as: VITAMIN D   Take 1,000 Units by mouth daily.      docusate sodium 100 MG capsule   Commonly known as: COLACE   Take 100 mg by mouth 2 (two) times daily.      dorzolamide-timolol 22.3-6.8 MG/ML ophthalmic solution   Commonly known as: COSOPT   Place 1 drop into both eyes 2 (two) times daily.      fentaNYL 12 MCG/HR   Commonly known as: DURAGESIC -  dosed mcg/hr   Place 1 patch (12.5 mcg total) onto the skin every 3 (three) days.      fluocinolone 0.01 % external oil   Commonly known as: DERMA-SMOOTHE   Apply topically 3 (three) times daily.      Fluticasone-Salmeterol 250-50 MCG/DOSE Aepb   Commonly known as: ADVAIR   Inhale 1 puff into the lungs 2 (two) times daily.      gabapentin 300 MG capsule   Commonly known as: NEURONTIN   Take 900 mg by mouth 2 (two) times daily.      irbesartan 75 MG tablet   Commonly known as: AVAPRO   Take 1 tablet (75 mg total) by mouth daily.      latanoprost 0.005 % ophthalmic solution   Commonly known as: XALATAN   Place 1 drop into both eyes at bedtime.      letrozole 2.5 MG tablet   Commonly known as: FEMARA   Take 2.5 mg by mouth daily.      levETIRAcetam 500 MG tablet   Commonly known as: KEPPRA   Take 1,000 mg by mouth at bedtime.      levothyroxine 125 MCG tablet   Commonly known as: SYNTHROID, LEVOTHROID   Take 1 tablet (125 mcg total) by mouth daily.      multivitamin with minerals Tabs   Take 1 tablet by mouth daily.      NIFEdipine 30 MG 24 hr tablet   Commonly known as:  PROCARDIA-XL/ADALAT-CC/NIFEDICAL-XL   Take 30 mg by mouth daily.      omeprazole 20 MG capsule   Commonly known as: PRILOSEC   Take 20 mg by mouth 2 (two) times daily.      pilocarpine 1 % ophthalmic solution   Commonly known as: PILOCAR   Place 1 drop into both eyes 3 (three) times daily.      solifenacin 5 MG tablet   Commonly known as: VESICARE   Take 5 mg by mouth daily.      tiotropium 18 MCG inhalation capsule   Commonly known as: SPIRIVA   Place 1 capsule (18 mcg total) into inhaler and inhale daily.      warfarin 5 MG tablet   Commonly known as: COUMADIN   Take 0.5-1 tablets (2.5-5 mg total) by mouth daily. 1 tab daily except for 0.5 tabs on Wednesdays and Fridays.          The results of significant diagnostics from this hospitalization (including imaging, microbiology, ancillary and laboratory) are listed below for reference.    Significant Diagnostic Studies: Ct Angio Head W/cm &/or Wo Cm  02/27/2012  *RADIOLOGY REPORT*  Clinical Data:  78 year old female with history of aneurysm. Transient ischemic attack.  CT ANGIOGRAPHY HEAD  Technique:  Multidetector CT imaging of the head was performed using the standard protocol during bolus administration of intravenous contrast.  Multiplanar CT image reconstructions including MIPs were obtained to evaluate the vascular anatomy.  Contrast: OMNIPAQUE IOHEXOL 350 MG/ML SOLN  Comparison:   head CT without contrast 08/19/2011.  Brain MRI 04/30/2009.  Findings:   Visualized orbits and scalp soft tissues are within normal limits.  Visualized paranasal sinuses and mastoids are clear.  No acute osseous abnormality identified.  Chronic bilateral PCA territory encephalomalacia.  Dystrophic calcifications are associated, more so on the right.  The left posterior MCA territory is also affected, up to the superior left parietal lobe.  This is unchanged.  Stable ventriculomegaly. No midline shift, mass effect, or evidence of  mass lesion.  No  acute intracranial hemorrhage identified.  No evidence of cortically based acute infarction identified.  No abnormal enhancement identified.  Vascular Findings: Major intracranial venous structures are enhancing.    Codominant distal vertebral arteries are patent.  Normal right PICA.  Normal vertebrobasilar junction.  Dominant left AICA. Tortuous basilar artery without stenosis.  SCA and PCA origins are within normal limits.  Posterior communicating arteries are diminutive or absent.  Bilateral PCA branches are within normal limits.  Negative visualized distal cervical ICA.  Moderately calcified bilateral ICA siphons, greater on the right.  Subsequent stenosis is less than 50 % with respect to the distal vessel.  Ophthalmic artery origins and supraclinoid ICA segments are within normal limits.  Normal carotid termini.  Dominant left ACA A1 segment.  ACA origins are within normal limits.  Diminutive or absent anterior communicating artery.  Bilateral ACA branches are within normal limits.  MCA origins are within normal limits.  Bilateral MCA branches are within normal limits.    Review of the MIP images confirms the above findings.  IMPRESSION: 1.  ICA atherosclerosis and mild basilar artery tortuosity.  No intracranial aneurysm, significant stenosis, or major arterial branch occlusion identified. 2.  Chronic right PCA and left PCA / MCA infarcts are unchanged since 2010. 3. No acute intracranial abnormality.   Original Report Authenticated By: Harley Hallmark, M.D.    Dg Chest 2 View  03/02/2012  *RADIOLOGY REPORT*  Clinical Data: Fever.  CHEST - 2 VIEW  Comparison: 02/10/2012.  Findings: Poor inspiration with crowding of the pulmonary vasculature and interstitial markings.  Grossly clear lungs.  The cardiac silhouette remains grossly normal in size.  Diffuse osteopenia.  IMPRESSION: No acute abnormality.  Poor inspiration.   Original Report Authenticated By: Darrol Angel, M.D.    Ct Head Wo  Contrast  03/02/2012  *RADIOLOGY REPORT*  Clinical Data: Headache and weakness.  History of seizure disorder, aneurysm, and breast cancer.  CT HEAD WITHOUT CONTRAST  Technique:  Contiguous axial images were obtained from the base of the skull through the vertex without contrast.  Comparison: MRI brain 02/27/2012. CT head 02/27/2012.  Findings: Encephalomalacia in the left posterior parietal region and both occipital lobes consistent with old infarcts. Calcification in the right occipital region.  Diffuse cerebral atrophy.  Ventricular dilatation consistent with central atrophy. Low attenuation change in the deep white matter consistent with small vessel ischemic change.  No mass effect or midline shift.  No abnormal extra-axial fluid collections.  Basal cisterns are not effaced.  No evidence of acute intracranial hemorrhage.  No significant change since previous study.  No depressed skull fractures.  Visualized paranasal sinuses and mastoid air cells are not opacified.  Vascular calcifications.  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 Chronic atrophy and small vessel ischemic change.  No acute intracranial abnormalities.   Original Report Authenticated By: Marlon Pel, M.D.    Mri Brain Without Contrast  02/28/2012  *RADIOLOGY REPORT*  Clinical Data: 78 year old female with weakness.  History of previous stroke.  MRI HEAD WITHOUT CONTRAST  Technique:  Multiplanar, multiecho pulse sequences of the brain and surrounding structures were obtained according to standard protocol without intravenous contrast.  Comparison: Head CTA from earlier the same day.  Brain MRI 04/30/2009.  Findings: No restricted diffusion to suggest acute infarction. Chronic left parietal lobe and bilateral occipital lobe infarct with encephalomalacia and gliosis is unchanged since 2010.  Interval mild additional enlargement of the ventricles.  No evidence of transependymal edema.  The temporal  horns remain relatively gracile.  Major intracranial vascular flow voids are stable. No midline shift, mass effect, or evidence of mass lesion.  Chronic blood products associated with the bilateral remote infarcts.  There is a mild degree of superficial siderosis over the bilateral superior hemispheres.  Occasional other chronic micro hemorrhages in the brain.  No new signal abnormality.  Stable visualized cervical spine with degenerative anterolisthesis of C3 on C4 contributing to a degree of spinal stenosis.  Negative pituitary. Visualized orbit soft tissues are within normal limits. Minimal paranasal sinus mucosal thickening.  Minimal fluid in the left mastoids.  Negative nasopharynx.  Negative scalp soft tissues.  IMPRESSION: 1. No acute intracranial abnormality. 2.  Bilateral occipital lobe and left parietal lobe chronic hemorrhagic infarcts. 3.  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 4.  Mild ventricular enlargement since 2010.   Original Report Authenticated By: Harley Hallmark, M.D.    US Renal  02/10/2012  *RADIOLOGY REPORT*  Clinical Data: Renal failure  RENAL/URINARY TRACT ULTRASOUND COMPLETE  Comparison:  Lumbar MRI 03/22/2011  Findings:  Right Kidney:  9.7 cm.  Negative for obstruction.  Left Kidney:  Not well visualized by ultrasound.  No definite hydronephrosis.  Accurate  kidney measurements were not possible due to poor visualization of the kidney.  Bladder:  Normal  IMPRESSION: Right kidney appears normal.  Left kidney is not well seen but does not appear to demonstrate hydronephrosis.   Original Report Authenticated By: Camelia Phenes, M.D.    Dg Chest Port 1 View  02/10/2012  *RADIOLOGY REPORT*  Clinical Data: Low oxygen saturation  PORTABLE CHEST - 1 VIEW  Comparison: Prior chest x-ray 01/28/2012, prior chest CT 01/22/2011  Findings: Single portable view of the chest.  No pulmonary edema, or focal airspace consolidation.  Linear opacity in the left  retrocardiac region likely reflects a small amount of atelectasis. No pleural effusion, or pneumothorax.  Cardiac and mediastinal contours within normal limits.  Atherosclerotic calcification of the thoracic aorta noted.  No acute osseous findings.  IMPRESSION: No acute cardiopulmonary disease.   Original Report Authenticated By: Vilma Prader     Microbiology: Recent Results (from the past 240 hour(s))  MRSA PCR SCREENING     Status: Normal   Collection Time   03/02/12  6:54 AM      Component Value Range Status Comment   MRSA by PCR NEGATIVE  NEGATIVE Final      Labs: Basic Metabolic Panel:  Lab 03/02/12 7829 03/02/12 0005 02/29/12 0433 02/28/12 0515 02/27/12 1548  NA 139 138 140 142 142  K 3.7 4.2 3.5 3.4* 3.5  CL 104 104 105 106 105  CO2 27 29 27 28 29   GLUCOSE 95 107* 94 100* 90  BUN 15 15 16 11 14   CREATININE 1.20* 1.24* 1.15* 1.11* 1.12*  CALCIUM 8.6 8.7 8.8 9.0 9.2  MG -- -- -- -- 2.1  PHOS -- -- -- -- 1.9*   Liver Function Tests:  Lab 03/02/12 0530 03/02/12 0005  AST 18 27  ALT 15 15  ALKPHOS 45 44  BILITOT 0.3 0.4  PROT 6.2 6.4  ALBUMIN 2.8* 2.8*    Lab 03/02/12 0530  AMMONIA 21   CBC:  Lab 03/02/12 0530 03/02/12 0005 02/28/12 0515 02/27/12 1548  WBC 10.1 10.5 7.8 8.0  NEUTROABS 4.9 6.0 -- 3.5  HGB 12.3 12.7 12.1 13.0  HCT 37.0 37.2 36.7 38.8  MCV  80.8 80.2 81.4 81.3  PLT 182 184 188 204   Cardiac Enzymes:  Lab 02/27/12 1548  CKTOTAL --  CKMB --  CKMBINDEX --  TROPONINI <0.30   BNP: BNP (last 3 results)  Basename 02/10/12 2215  PROBNP 547.6*    Time coordinating discharge: >30 minutes  Signed:  Takota Cahalan  [facility] 03/04/2012, 1:48 PM

## 2012-03-04 NOTE — Progress Notes (Signed)
Patient cleared for return to [facility]. Packet copied and placed in [place]. [facility] called for transportation.  Stiles Maxcy C. Regina Ganci MSW, LCSW [phone]

## 2012-03-04 NOTE — Progress Notes (Signed)
ANTICOAGULATION CONSULT NOTE - Follow Up Consult  Pharmacy Consult for Coumadin Indication: h/o DVT  Allergies  Allergen Reactions  . Morphine Sulfate     REACTION: rash: ITCHING  . Penicillins     REACTION: urticaria (hives)  . Sertraline Hcl     REACTION: rash    Patient Measurements: Height: 5\' 2"  (157.5 cm) Weight: 163 lb 12.8 oz (74.3 kg) IBW/kg (Calculated) : 50.1   Vital Signs: Temp: 97.7 F (36.5 C) (09/12 0610) Temp src: Oral (09/12 0610) BP: 114/60 mmHg (09/12 0610) Pulse Rate: 69  (09/12 0610)  Labs:  Alvira Philips 03/04/12 0438 03/03/12 0445 03/02/12 0633 03/02/12 0530 03/02/12 0005  HGB -- -- -- 12.3 12.7  HCT -- -- -- 37.0 37.2  PLT -- -- -- 182 184  APTT -- -- -- -- --  LABPROT 24.0* 23.5* 22.2* -- --  INR 2.11* 2.05* 1.91* -- --  HEPARINUNFRC -- -- -- -- --  CREATININE -- -- -- 1.20* 1.24*  CKTOTAL -- -- -- -- --  CKMB -- -- -- -- --  TROPONINI -- -- -- -- --    Estimated Creatinine Clearance: 31.8 ml/min (by C-G formula based on Cr of 1.2).  Assessment:  88 yof on chronic anticoagulation with Coumadin for h/o DVT.  Patient was reportedly on Coumadin 5mg  daily except 2.5 mg on Wed/Fri. Patient missed 9/9 dose, coumadin resumed 9/10.   INR today 2.11.  CBC ok 9/10, no bleeding documented  Drug-drug interaction noted with Azithromycin - will monitor  Goal of Therapy:  INR 2-3 Monitor platelets by anticoagulation protocol: Yes   Plan:   Coumadin  5mg  po x 1 tonight as per home regimen  Daily PT/INR   If plan for discharge, ok to resume home regimen of 5mg  daily except 2.5 mg on Wed/Fri.  Geoffry Paradise Thi 03/04/2012,10:30 AM

## 2012-03-04 NOTE — Progress Notes (Signed)
Report called to [facility]. Discharge instructions and medications reviewed and all questions answered. Julio Sicks RN

## 2012-03-05 DIAGNOSIS — I872 Venous insufficiency (chronic) (peripheral): Secondary | ICD-10-CM

## 2012-03-05 DIAGNOSIS — L97909 Non-pressure chronic ulcer of unspecified part of unspecified lower leg with unspecified severity: Secondary | ICD-10-CM

## 2012-03-05 DIAGNOSIS — L02419 Cutaneous abscess of limb, unspecified: Secondary | ICD-10-CM

## 2012-03-05 DIAGNOSIS — I5030 Unspecified diastolic (congestive) heart failure: Secondary | ICD-10-CM

## 2012-03-05 DIAGNOSIS — L03119 Cellulitis of unspecified part of limb: Secondary | ICD-10-CM

## 2012-03-05 NOTE — Care Management Note (Signed)
    Page 1 of 1   03/05/2012     11:46:48 AM   CARE MANAGEMENT NOTE 03/05/2012  Patient:  Jennifer Brennan, Jennifer Brennan   Account Number:  [id number]  Date Initiated:  03/05/2012  Documentation initiated by:  Lanier Clam  Subjective/Objective Assessment:   ADMITTED W/ACUTE ENCEPHALOPATHY.READMIT     Action/Plan:   FROM SNF.   Anticipated DC Date:  03/04/2012   Anticipated DC Plan:  SKILLED NURSING FACILITY  In-house referral  Clinical Social Worker      DC Planning Services  CM consult      Choice offered to / List presented to:             Status of service:  Completed, signed off Medicare Important Message given?   (If response is "NO", the following Medicare IM given date fields will be blank) Date Medicare IM given:   Date Additional Medicare IM given:    Discharge Disposition:  SKILLED NURSING FACILITY  Per UR Regulation:  Reviewed for med. necessity/level of care/duration of stay  If discussed at Long Length of Stay Meetings, dates discussed:    Comments:

## 2012-03-09 ENCOUNTER — Encounter (HOSPITAL_BASED_OUTPATIENT_CLINIC_OR_DEPARTMENT_OTHER): Payer: Medicare Other

## 2012-03-09 ENCOUNTER — Telehealth: Payer: Self-pay | Admitting: *Deleted

## 2012-03-09 NOTE — Telephone Encounter (Signed)
Yes - i will forward to [place] - thanks

## 2012-03-09 NOTE — Telephone Encounter (Signed)
Jennifer Brennan from pt's assisted living facility called-pt's Coumadin dosage was changed to 4mg  every day except Tuesday-on Tuesday pt takes 8mg -pt needs INR done on 9/24 but pt has appointment on 09/23 to F/U with you-okay to send message to [place] to schedule?

## 2012-03-10 ENCOUNTER — Telehealth: Payer: Self-pay | Admitting: General Practice

## 2012-03-10 NOTE — Telephone Encounter (Signed)
Spoke with patient's dtr.  Pt has appointment with Dr. Felicity Coyer on Monday 9-23 and will also have INR checked in [facility].

## 2012-03-12 ENCOUNTER — Ambulatory Visit (HOSPITAL_COMMUNITY)
Admission: RE | Admit: 2012-03-12 | Discharge: 2012-03-12 | Disposition: A | Discharge status: Home / Self Care | Payer: Medicare Other | Source: Ambulatory Visit | Attending: Internal Medicine | Admitting: Internal Medicine

## 2012-03-12 ENCOUNTER — Emergency Department (HOSPITAL_COMMUNITY)
Admission: EM | Admit: 2012-03-12 | Discharge: 2012-03-12 | Disposition: A | Discharge status: Home / Self Care | Payer: Medicare Other | Attending: Emergency Medicine | Admitting: Emergency Medicine

## 2012-03-12 ENCOUNTER — Encounter (HOSPITAL_COMMUNITY): Payer: Self-pay | Admitting: *Deleted

## 2012-03-12 DIAGNOSIS — I10 Essential (primary) hypertension: Secondary | ICD-10-CM | POA: Insufficient documentation

## 2012-03-12 DIAGNOSIS — I82409 Acute embolism and thrombosis of unspecified deep veins of unspecified lower extremity: Secondary | ICD-10-CM | POA: Insufficient documentation

## 2012-03-12 DIAGNOSIS — Z7901 Long term (current) use of anticoagulants: Secondary | ICD-10-CM | POA: Insufficient documentation

## 2012-03-12 DIAGNOSIS — Z79899 Other long term (current) drug therapy: Secondary | ICD-10-CM | POA: Insufficient documentation

## 2012-03-12 DIAGNOSIS — M79606 Pain in leg, unspecified: Secondary | ICD-10-CM

## 2012-03-12 DIAGNOSIS — M79609 Pain in unspecified limb: Secondary | ICD-10-CM | POA: Insufficient documentation

## 2012-03-12 DIAGNOSIS — F0390 Unspecified dementia without behavioral disturbance: Secondary | ICD-10-CM | POA: Insufficient documentation

## 2012-03-12 LAB — CBC WITH DIFFERENTIAL/PLATELET
Basophils Absolute: 0 10*3/uL (ref 0.0–0.1)
Basophils Relative: 1 % (ref 0–1)
Eosinophils Absolute: 0.3 10*3/uL (ref 0.0–0.7)
Eosinophils Relative: 4 % (ref 0–5)
Lymphocytes Relative: 42 % (ref 12–46)
MCH: 27.2 pg (ref 26.0–34.0)
MCV: 81.9 fL (ref 78.0–100.0)
Platelets: 301 10*3/uL (ref 150–400)
RDW: 15.5 % (ref 11.5–15.5)
WBC: 7.2 10*3/uL (ref 4.0–10.5)

## 2012-03-12 LAB — BASIC METABOLIC PANEL
Calcium: 9.4 mg/dL (ref 8.4–10.5)
GFR calc Af Amer: 54 mL/min — ABNORMAL LOW (ref >90)
GFR calc non Af Amer: 46 mL/min — ABNORMAL LOW (ref >90)
Sodium: 146 mEq/L — ABNORMAL HIGH (ref 135–145)

## 2012-03-12 LAB — PROTIME-INR
INR: 1.68 — ABNORMAL HIGH (ref 0.00–1.49)
Prothrombin Time: 19.2 seconds — ABNORMAL HIGH (ref 11.6–15.2)

## 2012-03-12 MED ORDER — WARFARIN SODIUM 6 MG PO TABS
6.0000 mg | ORAL_TABLET | Freq: Once | ORAL | Status: AC
  Administered 2012-03-12: 6 mg via ORAL
  Filled 2012-03-12: qty 1

## 2012-03-12 MED ORDER — ENOXAPARIN SODIUM 120 MG/0.8ML ~~LOC~~ SOLN
1.5000 mg/kg | Freq: Once | SUBCUTANEOUS | Status: AC
  Administered 2012-03-12: 110 mg via SUBCUTANEOUS
  Filled 2012-03-12: qty 0.8

## 2012-03-12 MED ORDER — ENOXAPARIN SODIUM 100 MG/ML ~~LOC~~ SOLN: 1.5000 mg/kg | Freq: Every day | SUBCUTANEOUS | Status: DC

## 2012-03-12 NOTE — Progress Notes (Addendum)
*  Preliminary Results* Bilateral lower extremity venous duplex completed. Bilateral lower extremities are positive for deep vein thrombosis involving bilateral popliteal, posterior tibial, and peroneal veins and the left femoral vein. There is also evidence of a small right Baker's cyst. Dr.Robson was unavailable, preliminary results discussed with Dr.Parker.  03/12/2012 3:26 PM Gertie Fey, RDMS, RDCS

## 2012-03-12 NOTE — ED Notes (Signed)
Pt family states that pt has diagnosed DVT in both legs. Pt was seen in vascular today. Pt has rt leg wrapped.

## 2012-03-12 NOTE — ED Provider Notes (Signed)
History     CSN: [id number]  Arrival date & time 03/12/12  1708   First MD Initiated Contact with Patient 03/12/12 1726      Chief Complaint  Patient presents with  . DVT    (Consider location/radiation/quality/duration/timing/severity/associated sxs/prior treatment) HPI Comments: Jennifer Brennan is a 78 y.o. Female who saw her wound care. Dr. for right lower leg wound yesterday. He was concerned about possible DVTs, he ordered ultrasounds to be done today, they were positive bilateral for lower extremity DVT. The patient missed 2 doses of Coumadin. Last weekend. Since then she has taken an extra dose of Coumadin and her regular daily dosing, except today. She is currently living in the home setting. She recently left the nursing home. There have been no acute illnesses. No known aggravating or palliative factors.  The history is provided by the patient.    Past Medical History  Diagnosis Date  . Hypertension   . Hypothyroidism   . Seizure disorder   . History of cerebrovascular accident   . Severe stage glaucoma     legally blind  . Depression with anxiety   . OAB (overactive bladder)   . ADENOCARCINOMA, LEFT BREAST 04/11/2010    s/p L mastectomy, on femara x 44yr  . BACK PAIN, LUMBAR, CHRONIC   . DVT 05/2007    RLE following R THR - chronic coumadin, chronic RLE pain  . GLAUCOMA   . OSTEOARTHRITIS, HANDS, BILATERAL   . OSTEOPENIA   . Retinal ischemia   . Sciatica of right side     chronic RLE pain, multifactorial - MRI T/L spine 02/2011  . VITAMIN D DEFICIENCY dx 08/2008  . Venous ulcer of right lower extremity with varicose veins   . Dementia     Past Surgical History  Procedure Date  . Total abdominal hysterectomy   . Total hip arthroplasty 10/2006    right hip  . Refractive surgery     B/L  . Breast surgery 03/2010    breast biopsy, L mastectomy   . Tonsillectomy     Family History  Problem Relation Age of Onset  . Ovarian cancer Mother   . Cancer  Mother   . Deep vein thrombosis Mother   . Heart disease Mother   . Arthritis Other     grandparents  . Lung cancer Other   . Heart disease Other     parent  . Cancer Father     BRAIN TUMOR  . Cancer Brother   . Hyperlipidemia Brother   . Hypertension Brother   . Stroke Brother   . Arthritis Brother   . Diabetes Daughter   . Heart disease Daughter   . Hypertension Daughter   . Hyperlipidemia Daughter   . Other Daughter     VARICOSE VEINS    History  Substance Use Topics  . Smoking status: Former Smoker -- 45 years    Types: Cigarettes    Quit date: 10/16/1979  . Smokeless tobacco: Never Used  . Alcohol Use: No    OB History    Grav Para Term Preterm Abortions TAB SAB Ect Mult Living                  Review of Systems  All other systems reviewed and are negative.    Allergies  Penicillins; Morphine sulfate; and Sertraline hcl  Home Medications   Current Outpatient Rx  Name Route Sig Dispense Refill  . ACETAMINOPHEN 325 MG PO TABS Oral Take  650 mg by mouth every 6 (six) hours as needed. For pain    . ALBUTEROL SULFATE HFA 108 (90 BASE) MCG/ACT IN AERS Inhalation Inhale 2 puffs into the lungs every 6 (six) hours as needed. For shortness of breath.    Maximino Greenland 18-103 MCG/ACT IN AERO Inhalation Inhale 1-2 puffs into the lungs every 6 (six) hours as needed. For wheezing    . BRIMONIDINE TARTRATE 0.15 % OP SOLN Both Eyes Place 1 drop into both eyes 2 (two) times daily.    Marland Kitchen VITAMIN D 1000 UNITS PO TABS Oral Take 1,000 Units by mouth at bedtime.     Marland Kitchen DOCUSATE SODIUM 100 MG PO CAPS Oral Take 100 mg by mouth 2 (two) times daily.     . DORZOLAMIDE HCL-TIMOLOL MAL 22.3-6.8 MG/ML OP SOLN Both Eyes Place 1 drop into both eyes 2 (two) times daily.      . FENTANYL 12 MCG/HR TD PT72 Transdermal Place 1 patch onto the skin every 3 (three) days.    Marland Kitchen FLUOCINOLONE ACETONIDE 0.01 % EX OIL Topical Apply 1 application topically 3 (three) times daily as needed. For  itching    . FLUTICASONE-SALMETEROL 250-50 MCG/DOSE IN AEPB Inhalation Inhale 1 puff into the lungs 2 (two) times daily.    Marland Kitchen GABAPENTIN 100 MG PO CAPS Oral Take 100-300 mg by mouth 3 (three) times daily. Pt takes 100mg  3 times daily but takes 300mg  3 times daily when swallowing is easier per family member    . IRBESARTAN 300 MG PO TABS Oral Take 300 mg by mouth every morning.    Marland Kitchen LATANOPROST 0.005 % OP SOLN Both Eyes Place 1 drop into both eyes at bedtime.      Marland Kitchen LETROZOLE 2.5 MG PO TABS Oral Take 2.5 mg by mouth at bedtime.     Marland Kitchen LEVETIRACETAM 500 MG PO TABS Oral Take 500 mg by mouth 2 (two) times daily.     Marland Kitchen LEVOTHYROXINE SODIUM 125 MCG PO TABS Oral Take 125 mcg by mouth daily.    . ADULT MULTIVITAMIN W/MINERALS CH Oral Take 1 tablet by mouth daily.    Marland Kitchen NIFEDIPINE ER OSMOTIC 30 MG PO TB24 Oral Take 30 mg by mouth at bedtime.     . OMEPRAZOLE 20 MG PO CPDR Oral Take 20 mg by mouth 2 (two) times daily.    Marland Kitchen PILOCARPINE HCL 1 % OP SOLN Both Eyes Place 1 drop into both eyes 3 (three) times daily.     Marland Kitchen SOLIFENACIN SUCCINATE 5 MG PO TABS Oral Take 5 mg by mouth every morning.     Marland Kitchen TIOTROPIUM BROMIDE MONOHYDRATE 18 MCG IN CAPS Inhalation Place 18 mcg into inhaler and inhale daily.    . WARFARIN SODIUM 4 MG PO TABS Oral Take 4 mg by mouth every evening.    Marland Kitchen ENOXAPARIN SODIUM 100 MG/ML [place] SOLN Subcutaneous Inject 1.1 mLs (110 mg total) into the skin daily. 20 mL 0    BP 129/61  Pulse 66  Temp 97.7 F (36.5 C) (Oral)  Resp 16  Wt 163 lb 12.8 oz (74.3 kg)  SpO2 100%  Physical Exam  Nursing note and vitals reviewed. Constitutional: She appears well-developed and well-nourished.  HENT:  Head: Normocephalic and atraumatic.  Eyes: Conjunctivae normal and EOM are normal. Pupils are equal, round, and reactive to light.  Neck: Normal range of motion and phonation normal. Neck supple.  Cardiovascular: Normal rate, regular rhythm and intact distal pulses.  Intact distal capillary refill  in both feet.  Pulmonary/Chest: Effort normal and breath sounds normal. She exhibits no tenderness.  Abdominal: Soft. She exhibits no distension. There is no tenderness. There is no guarding.  Musculoskeletal:       Left lower leg is without apparent edema. The right lower leg has a large dressing on a chronic wound. There is mild swelling above the right, lower leg dressing. A cast are nontender bilaterally.  Neurological: She is alert. She has normal strength. She exhibits normal muscle tone. Coordination normal.  Skin: Skin is warm and dry.  Psychiatric: She has a normal mood and affect. Her behavior is normal. Judgment and thought content normal.    ED Course  Procedures (including critical care time)   She is treated with a dose of Lovenox 1.5 mg per kilogram.  Coumadin dose, 6 mg given    Labs Reviewed  BASIC METABOLIC PANEL - Abnormal; Notable for the following:    Sodium 146 (*)     Glucose, Bld 151 (*)     GFR calc non Af Amer 46 (*)     GFR calc Af Amer 54 (*)     All other components within normal limits  PROTIME-INR - Abnormal; Notable for the following:    Prothrombin Time 19.2 (*)     INR 1.68 (*)     All other components within normal limits  CBC WITH DIFFERENTIAL  LAB REPORT - SCANNED   No results found.   1. DVT (deep venous thrombosis)       MDM  DVT, with, low, INR. She will be bridged with Lovenox to improve her INR. She is stable for discharge    Plan: Home Medications- Lovenox Bridging. Coumadin 6mg  tomorrow, then 4 mg.; Home Treatments- rest; Recommended follow up- PCP in 2-3 days        Flint Melter, MD 03/14/12 [phone]

## 2012-03-12 NOTE — ED Notes (Signed)
Per on-call RNCM request, order/face sheet faxed to [facility] for [facility] for Lovenox mgmt.  RNCM to f/u with [facility] in the am.

## 2012-03-15 ENCOUNTER — Encounter: Payer: Self-pay | Admitting: Internal Medicine

## 2012-03-15 ENCOUNTER — Other Ambulatory Visit: Payer: Self-pay | Admitting: General Practice

## 2012-03-15 ENCOUNTER — Ambulatory Visit (INDEPENDENT_AMBULATORY_CARE_PROVIDER_SITE_OTHER): Payer: Medicare Other | Admitting: Internal Medicine

## 2012-03-15 ENCOUNTER — Ambulatory Visit (INDEPENDENT_AMBULATORY_CARE_PROVIDER_SITE_OTHER): Payer: Medicare Other | Admitting: General Practice

## 2012-03-15 VITALS — BP 128/60 | HR 57 | Temp 97.5°F | Resp 15

## 2012-03-15 DIAGNOSIS — I6350 Cerebral infarction due to unspecified occlusion or stenosis of unspecified cerebral artery: Secondary | ICD-10-CM

## 2012-03-15 DIAGNOSIS — I83009 Varicose veins of unspecified lower extremity with ulcer of unspecified site: Secondary | ICD-10-CM

## 2012-03-15 DIAGNOSIS — M79609 Pain in unspecified limb: Secondary | ICD-10-CM

## 2012-03-15 DIAGNOSIS — E039 Hypothyroidism, unspecified: Secondary | ICD-10-CM

## 2012-03-15 DIAGNOSIS — I82409 Acute embolism and thrombosis of unspecified deep veins of unspecified lower extremity: Secondary | ICD-10-CM

## 2012-03-15 DIAGNOSIS — M79661 Pain in right lower leg: Secondary | ICD-10-CM

## 2012-03-15 DIAGNOSIS — I83019 Varicose veins of right lower extremity with ulcer of unspecified site: Secondary | ICD-10-CM

## 2012-03-15 DIAGNOSIS — I639 Cerebral infarction, unspecified: Secondary | ICD-10-CM

## 2012-03-15 DIAGNOSIS — N289 Disorder of kidney and ureter, unspecified: Secondary | ICD-10-CM

## 2012-03-15 MED ORDER — WARFARIN SODIUM 4 MG PO TABS: 4.0000 mg | ORAL_TABLET | Freq: Every evening | ORAL | Status: DC

## 2012-03-15 NOTE — Assessment & Plan Note (Signed)
Chronic pain, multifactorial: History of DVT December 2008 right lower extremity - on chronic Coumadin - Arthritic changes with history of right total hip replacement May 2008 - continued groin pain (chronic) S/p vvs evaluation 06/2007 and 01/2012 due to pain -"not circulatory issue" per family  Also, periodic orthopedic evaluation for sciatica; MRI thoracic and lumbar spine done September 2012 Drs Francis Dowse and Alvester Morin - s/p ESI in early 2013, some relief of back pain but not RLE pain -  generally intolerant of narcotics due to dementia, but on low dose fenantyl + prn oxy IR - reviewed subsquent neuro side effects: sedation and confusion Titratating gabapentin and continue scheduled Tylenol S/p 01/2012 eval by pain mgmt -

## 2012-03-15 NOTE — Assessment & Plan Note (Signed)
Transient problem late August 2013 hospitalization Related to diuretic therapy, Septra and ARB Labs and medications reviewed - stable/resolved Lab Results  Component Value Date   CREATININE 1.06 03/12/2012

## 2012-03-15 NOTE — Progress Notes (Signed)
  Subjective:    Patient ID: Jennifer Brennan, female    DOB: [birth year], 78 y.o.   MRN: [id number]  HPI here for follow up - recurrent DVT 03/12/12 and RLE venous stasis ulcer Numerous hospitalizations and ER visits in interval reviewed today  Past Medical History  Diagnosis Date  . Hypertension   . Hypothyroidism   . Seizure disorder   . History of cerebrovascular accident   . Severe stage glaucoma     legally blind  . Depression with anxiety   . OAB (overactive bladder)   . ADENOCARCINOMA, LEFT BREAST 04/11/2010    s/p L mastectomy, on femara x 55yr  . BACK PAIN, LUMBAR, CHRONIC   . DVT 05/2007    RLE following R THR - chronic coumadin, chronic RLE pain  . GLAUCOMA   . OSTEOARTHRITIS, HANDS, BILATERAL   . OSTEOPENIA   . Retinal ischemia   . Sciatica of right side     chronic RLE pain, multifactorial - MRI T/L spine 02/2011  . VITAMIN D DEFICIENCY dx 08/2008  . Venous ulcer of right lower extremity with varicose veins   . Dementia     Review of Systems  Constitutional: Positive for fatigue. Negative for fever and unexpected weight change.  Respiratory: Negative for cough and shortness of breath.   Cardiovascular: Negative for chest pain and palpitations.  Skin: Positive for color change. Wound: RLE improving.       Objective:   Physical Exam  BP 128/60  Pulse 57  Temp 97.5 F (36.4 C) (Oral)  Resp 15  SpO2 96%  Constitutional: She is overweight but appears well-developed and well-nourished. Sitting in WC; No distress. dtr and spouse at side Cardiovascular: Normal rate, regular rhythm and normal heart sounds.  No murmur heard. R>L LE edema, trace-1+. Chronic venous changes distal RLE - see below Pulmonary/Chest: Effort normal, breath sounds diminished at bases. No respiratory distress. She has no exp wheezes.  Mskel: Right lower extremity without deformity or effusion. No swelling of joints. Full range of motion active and passive in knee, ankle and toes. Skin:  chronic RLE distal venous changes/darkening with slight erythema. Unna wrapping on lower RLE  Lab Results  Component Value Date   WBC 7.2 03/12/2012   HGB 12.2 03/12/2012   HCT 36.7 03/12/2012   PLT 301 03/12/2012   GLUCOSE 151* 03/12/2012   CHOL 249* 02/28/2012   TRIG 130 02/28/2012   HDL 55 02/28/2012   LDLCALC 102* 02/28/2012   ALT 15 03/02/2012   AST 18 03/02/2012   NA 146* 03/12/2012   K 3.6 03/12/2012   CL 108 03/12/2012   CREATININE 1.06 03/12/2012   BUN 9 03/12/2012   CO2 29 03/12/2012   TSH 27.420* 03/04/2012   INR 2.1 03/15/2012   HGBA1C 5.7 08/06/2007      Assessment & Plan:   See problem list. Medications and labs reviewed today.  Time spent with pt/family today 25 minutes, greater than 50% time spent counseling patient on dvt, recent hospitalizations for RLE wound and ?TIA and medication review. Also review of hospital records

## 2012-03-15 NOTE — Assessment & Plan Note (Signed)
Recurrent events, on chronic anticoagulation since 2008 Recent recurrence demonstrated on venous Doppler September 2013 Warfarin dose adjustments reviewed, continue management plans as ongoing via CC

## 2012-03-15 NOTE — Patient Instructions (Signed)
It was good to see you today. We have reviewed your hospital records including labs and tests today Medications reviewed, no changes at this time.

## 2012-03-15 NOTE — Assessment & Plan Note (Signed)
RLE venous stasis ulcer - improving Reviewed eval by [facility] 02/06/12 and [facility] 02/10/12 with subsequent hospitalization Continue HH and wound care

## 2012-03-15 NOTE — Assessment & Plan Note (Signed)
Dose changes reviewed Check and adjust as needed Lab Results  Component Value Date   TSH 27.420* 03/04/2012

## 2012-03-15 NOTE — Patient Instructions (Addendum)
Stop Lovenox.  Take an extra 1/2 tablet today (Monday) and then take 4 mg (1 tablet) all days except 8 mg (2 tablets) on Tuesday.  [facility] to re-check on 9-30.

## 2012-03-16 ENCOUNTER — Other Ambulatory Visit: Payer: Self-pay

## 2012-03-16 MED ORDER — LEVOTHYROXINE SODIUM 125 MCG PO TABS: 125.0000 ug | ORAL_TABLET | Freq: Every day | ORAL | Status: DC

## 2012-03-17 ENCOUNTER — Telehealth: Payer: Self-pay

## 2012-03-17 NOTE — Telephone Encounter (Signed)
Pt's daughter called stating that pt was started on Avapro 300 mg in hospital but dosage was decreased in Nursing home to 75 mg. Daughter is requesting MD advise on most appropriate dose.

## 2012-03-17 NOTE — Telephone Encounter (Signed)
Continue Avapro 300 mg daily and call if systolic blood pressure less than 100

## 2012-03-18 ENCOUNTER — Encounter (HOSPITAL_BASED_OUTPATIENT_CLINIC_OR_DEPARTMENT_OTHER): Payer: Medicare Other

## 2012-03-19 ENCOUNTER — Telehealth: Payer: Self-pay | Admitting: *Deleted

## 2012-03-19 NOTE — Telephone Encounter (Signed)
Called Pt could not leave message.  

## 2012-03-19 NOTE — Telephone Encounter (Signed)
Pt notified when received flu vacc.

## 2012-03-19 NOTE — Telephone Encounter (Signed)
Daughter [name]) called re: Jennifer Brennan's leg edema and care. Pt is currently under the care of Dr. Felicity Coyer for Coumadin dosing for DVT. She has also been seeing Wound care for venous stasis ulcers; these have healed up and pt is still seeing [facility] nurse for compression wraps. I advised [name] to follow instruction per Wound clinic and we defer to them at this time. Dr. Nicky Pugh next appt is on 05-14-12. Should any of these doctors need [place] to see her sooner, daughter may call and move this appt up. She will also make sure to continue coumdin clinic instructions.

## 2012-03-22 ENCOUNTER — Ambulatory Visit (INDEPENDENT_AMBULATORY_CARE_PROVIDER_SITE_OTHER): Payer: Medicare Other | Admitting: General Practice

## 2012-03-22 DIAGNOSIS — I639 Cerebral infarction, unspecified: Secondary | ICD-10-CM

## 2012-03-22 DIAGNOSIS — I82409 Acute embolism and thrombosis of unspecified deep veins of unspecified lower extremity: Secondary | ICD-10-CM

## 2012-03-22 DIAGNOSIS — I6350 Cerebral infarction due to unspecified occlusion or stenosis of unspecified cerebral artery: Secondary | ICD-10-CM

## 2012-03-23 ENCOUNTER — Telehealth: Payer: Self-pay | Admitting: General Practice

## 2012-03-23 NOTE — Telephone Encounter (Signed)
Returned call to patient's dtr. to re-interate coumadin dosage.

## 2012-03-24 ENCOUNTER — Other Ambulatory Visit: Payer: Self-pay

## 2012-03-24 MED ORDER — IRBESARTAN 75 MG PO TABS: 75.0000 mg | ORAL_TABLET | Freq: Every morning | ORAL | Status: DC

## 2012-03-24 NOTE — Telephone Encounter (Signed)
If blood pressure is controlled on Avapro 75 mg daily, okay to continue 75 mg daily. Meds dose changed and new prescription generated.  Thanks for the note

## 2012-03-24 NOTE — Telephone Encounter (Signed)
Pt's daughter states pt has been taking 75 mg and her BP has been normal - 110/60 and she is unsure if increasing to 300 mg is best for the patient. Is is okay for pt to remain on 75 mg? New Rx will be needed if okay.    * Pt's daughter denied pt ever having flu vaccination, pt/daughter was never notified of MD's recommendation and was upset by this. I apologized but daughter wanted to be sure MD was aware.

## 2012-03-24 NOTE — Addendum Note (Signed)
Addended by: [name] Paci A on: 03/24/2012 01:03 PM   Modules accepted: Orders

## 2012-03-24 NOTE — Telephone Encounter (Signed)
Pt's daughter advised of Rx/pharmacy 

## 2012-03-26 ENCOUNTER — Ambulatory Visit (INDEPENDENT_AMBULATORY_CARE_PROVIDER_SITE_OTHER): Payer: Medicare Other | Admitting: Cardiovascular Disease

## 2012-03-26 ENCOUNTER — Encounter: Payer: Self-pay | Admitting: Cardiovascular Disease

## 2012-03-26 ENCOUNTER — Emergency Department (HOSPITAL_COMMUNITY)
Admission: EM | Admit: 2012-03-26 | Discharge: 2012-03-26 | Disposition: A | Discharge status: Home / Self Care | Payer: Medicare Other | Attending: Emergency Medicine | Admitting: Emergency Medicine

## 2012-03-26 ENCOUNTER — Encounter (HOSPITAL_COMMUNITY): Payer: Self-pay | Admitting: *Deleted

## 2012-03-26 ENCOUNTER — Emergency Department (HOSPITAL_COMMUNITY): Payer: Medicare Other

## 2012-03-26 VITALS — BP 130/62 | HR 51 | Resp 18 | Ht 63.0 in | Wt 164.1 lb

## 2012-03-26 DIAGNOSIS — E039 Hypothyroidism, unspecified: Secondary | ICD-10-CM | POA: Insufficient documentation

## 2012-03-26 DIAGNOSIS — I82409 Acute embolism and thrombosis of unspecified deep veins of unspecified lower extremity: Secondary | ICD-10-CM | POA: Insufficient documentation

## 2012-03-26 DIAGNOSIS — H409 Unspecified glaucoma: Secondary | ICD-10-CM | POA: Insufficient documentation

## 2012-03-26 DIAGNOSIS — M545 Low back pain, unspecified: Secondary | ICD-10-CM | POA: Insufficient documentation

## 2012-03-26 DIAGNOSIS — F0390 Unspecified dementia without behavioral disturbance: Secondary | ICD-10-CM | POA: Insufficient documentation

## 2012-03-26 DIAGNOSIS — Z7901 Long term (current) use of anticoagulants: Secondary | ICD-10-CM | POA: Insufficient documentation

## 2012-03-26 DIAGNOSIS — M899 Disorder of bone, unspecified: Secondary | ICD-10-CM | POA: Insufficient documentation

## 2012-03-26 DIAGNOSIS — Y998 Other external cause status: Secondary | ICD-10-CM | POA: Insufficient documentation

## 2012-03-26 DIAGNOSIS — R0602 Shortness of breath: Secondary | ICD-10-CM

## 2012-03-26 DIAGNOSIS — G40909 Epilepsy, unspecified, not intractable, without status epilepticus: Secondary | ICD-10-CM | POA: Insufficient documentation

## 2012-03-26 DIAGNOSIS — I10 Essential (primary) hypertension: Secondary | ICD-10-CM | POA: Insufficient documentation

## 2012-03-26 DIAGNOSIS — E559 Vitamin D deficiency, unspecified: Secondary | ICD-10-CM | POA: Insufficient documentation

## 2012-03-26 DIAGNOSIS — Y92009 Unspecified place in unspecified non-institutional (private) residence as the place of occurrence of the external cause: Secondary | ICD-10-CM | POA: Insufficient documentation

## 2012-03-26 DIAGNOSIS — Z853 Personal history of malignant neoplasm of breast: Secondary | ICD-10-CM | POA: Insufficient documentation

## 2012-03-26 DIAGNOSIS — M19049 Primary osteoarthritis, unspecified hand: Secondary | ICD-10-CM | POA: Insufficient documentation

## 2012-03-26 DIAGNOSIS — F341 Dysthymic disorder: Secondary | ICD-10-CM | POA: Insufficient documentation

## 2012-03-26 DIAGNOSIS — I83009 Varicose veins of unspecified lower extremity with ulcer of unspecified site: Secondary | ICD-10-CM | POA: Insufficient documentation

## 2012-03-26 DIAGNOSIS — G8929 Other chronic pain: Secondary | ICD-10-CM | POA: Insufficient documentation

## 2012-03-26 DIAGNOSIS — S8010XA Contusion of unspecified lower leg, initial encounter: Secondary | ICD-10-CM | POA: Insufficient documentation

## 2012-03-26 DIAGNOSIS — H548 Legal blindness, as defined in USA: Secondary | ICD-10-CM | POA: Insufficient documentation

## 2012-03-26 DIAGNOSIS — IMO0002 Reserved for concepts with insufficient information to code with codable children: Secondary | ICD-10-CM | POA: Insufficient documentation

## 2012-03-26 DIAGNOSIS — R609 Edema, unspecified: Secondary | ICD-10-CM

## 2012-03-26 DIAGNOSIS — Z8673 Personal history of transient ischemic attack (TIA), and cerebral infarction without residual deficits: Secondary | ICD-10-CM | POA: Insufficient documentation

## 2012-03-26 DIAGNOSIS — M79606 Pain in leg, unspecified: Secondary | ICD-10-CM

## 2012-03-26 LAB — BASIC METABOLIC PANEL
CO2: 26 mEq/L (ref 19–32)
Calcium: 9.8 mg/dL (ref 8.4–10.5)
Chloride: 106 mEq/L (ref 96–112)
Creatinine, Ser: 1.08 mg/dL (ref 0.50–1.10)
GFR calc Af Amer: 52 mL/min — ABNORMAL LOW (ref >90)
Sodium: 142 mEq/L (ref 135–145)

## 2012-03-26 LAB — CBC WITH DIFFERENTIAL/PLATELET
Hemoglobin: 12.1 g/dL (ref 12.0–15.0)
Lymphocytes Relative: 39 % (ref 12–46)
Lymphs Abs: 2.8 10*3/uL (ref 0.7–4.0)
Monocytes Relative: 8 % (ref 3–12)
Neutro Abs: 3.6 10*3/uL (ref 1.7–7.7)
Neutrophils Relative %: 49 % (ref 43–77)
Platelets: 224 10*3/uL (ref 150–400)
RBC: 4.54 MIL/uL (ref 3.87–5.11)
WBC: 7.2 10*3/uL (ref 4.0–10.5)

## 2012-03-26 LAB — PROTIME-INR
INR: 2.75 — ABNORMAL HIGH (ref 0.00–1.49)
Prothrombin Time: 27.7 seconds — ABNORMAL HIGH (ref 11.6–15.2)

## 2012-03-26 NOTE — Patient Instructions (Addendum)
Your physician recommends that you schedule a follow-up appointment as needed.   Your physician recommends that you continue on your current medications as directed. Please refer to the Current Medication list given to you today.  

## 2012-03-26 NOTE — ED Provider Notes (Signed)
History     CSN: [id number]  Arrival date & time 03/26/12  1910   First MD Initiated Contact with Patient 03/26/12 1937      Chief Complaint  Patient presents with  . Leg Pain  . Bleeding/Bruising    (Consider location/radiation/quality/duration/timing/severity/associated sxs/prior treatment) HPI Comments: 78 y/o female presents to ED with her daughter after an oxygen tank hit her left leg shortly prior to arrival. Daughter states patient's Ivor Costa was helping her walk down the stairs holding her oxygen tank when it hit something and fell hitting patient's left leg. She is on coumadin and has DVT's in both lower extremities. States her leg hurts "really bad" and daughter says pain was relieved by elevating her leg on the ambulance. Denies numbness or tingling. She is having difficulty walking. Denies chest pain, sob, palpitations, nausea, vomiting, diaphoresis. Denies falling or hitting her head.  The history is provided by the patient and a relative.    Past Medical History  Diagnosis Date  . Hypertension   . Hypothyroidism   . Seizure disorder   . History of cerebrovascular accident   . Severe stage glaucoma     legally blind  . Depression with anxiety   . OAB (overactive bladder)   . ADENOCARCINOMA, LEFT BREAST 04/11/2010    s/p L mastectomy, on femara x 44yr  . BACK PAIN, LUMBAR, CHRONIC   . DVT 05/2007    RLE following R THR - chronic coumadin, chronic RLE pain  . GLAUCOMA   . OSTEOARTHRITIS, HANDS, BILATERAL   . OSTEOPENIA   . Retinal ischemia   . Sciatica of right side     chronic RLE pain, multifactorial - MRI T/L spine 02/2011  . VITAMIN D DEFICIENCY dx 08/2008  . Venous ulcer of right lower extremity with varicose veins   . Dementia     Past Surgical History  Procedure Date  . Total abdominal hysterectomy   . Total hip arthroplasty 10/2006    right hip  . Refractive surgery     B/L  . Breast surgery 03/2010    breast biopsy, L mastectomy   .  Tonsillectomy     Family History  Problem Relation Age of Onset  . Ovarian cancer Mother   . Cancer Mother   . Deep vein thrombosis Mother   . Heart disease Mother   . Arthritis Other     grandparents  . Lung cancer Other   . Heart disease Other     parent  . Cancer Father     BRAIN TUMOR  . Cancer Brother   . Hyperlipidemia Brother   . Hypertension Brother   . Stroke Brother   . Arthritis Brother   . Diabetes Daughter   . Heart disease Daughter   . Hypertension Daughter   . Hyperlipidemia Daughter   . Other Daughter     VARICOSE VEINS    History  Substance Use Topics  . Smoking status: Former Smoker -- 45 years    Types: Cigarettes    Quit date: 10/16/1979  . Smokeless tobacco: Never Used  . Alcohol Use: No    OB History    Grav Para Term Preterm Abortions TAB SAB Ect Mult Living                  Review of Systems  Constitutional: Negative for fever, chills and diaphoresis.  HENT: Negative for neck pain and neck stiffness.   Respiratory: Negative for shortness of breath.   Cardiovascular: Negative  for chest pain.  Gastrointestinal: Negative for nausea and vomiting.  Musculoskeletal:       Left lower leg pain  Skin: Positive for color change.  Neurological: Negative for dizziness, syncope and light-headedness.  Hematological: Bruises/bleeds easily.  Psychiatric/Behavioral: Negative for confusion.    Allergies  Bactrim; Furosemide; Percocet; Valsartan; Penicillins; Morphine sulfate; and Sertraline hcl  Home Medications   Current Outpatient Rx  Name Route Sig Dispense Refill  . ACETAMINOPHEN 325 MG PO TABS Oral Take 650 mg by mouth every 6 (six) hours as needed. For pain    . ALBUTEROL SULFATE HFA 108 (90 BASE) MCG/ACT IN AERS Inhalation Inhale 2 puffs into the lungs every 6 (six) hours as needed. For shortness of breath.    Maximino Greenland 18-103 MCG/ACT IN AERO Inhalation Inhale 1-2 puffs into the lungs every 6 (six) hours as needed. For  wheezing    . BRIMONIDINE TARTRATE 0.15 % OP SOLN Both Eyes Place 1 drop into both eyes 2 (two) times daily.    Marland Kitchen VITAMIN D 1000 UNITS PO TABS Oral Take 1,000 Units by mouth at bedtime.     Marland Kitchen DOCUSATE SODIUM 100 MG PO CAPS Oral Take 100 mg by mouth daily.     . DORZOLAMIDE HCL-TIMOLOL MAL 22.3-6.8 MG/ML OP SOLN Both Eyes Place 1 drop into both eyes 2 (two) times daily.      . FENTANYL 12 MCG/HR TD PT72 Transdermal Place 1 patch onto the skin every 3 (three) days.    Marland Kitchen FLUOCINOLONE ACETONIDE 0.01 % EX OIL Topical Apply 1 application topically 3 (three) times daily as needed. For itching    . FLUTICASONE-SALMETEROL 250-50 MCG/DOSE IN AEPB Inhalation Inhale 1 puff into the lungs 2 (two) times daily.    Marland Kitchen GABAPENTIN 300 MG PO CAPS Oral Take 300 mg by mouth 2 (two) times daily.    . IRBESARTAN 75 MG PO TABS Oral Take 1 tablet (75 mg total) by mouth every morning. 30 tablet 5  . LATANOPROST 0.005 % OP SOLN Both Eyes Place 1 drop into both eyes at bedtime.      Marland Kitchen LETROZOLE 2.5 MG PO TABS Oral Take 2.5 mg by mouth at bedtime.     Marland Kitchen LEVETIRACETAM 500 MG PO TABS Oral Take 500 mg by mouth 2 (two) times daily.     Marland Kitchen LEVOTHYROXINE SODIUM 125 MCG PO TABS Oral Take 1 tablet (125 mcg total) by mouth daily. 30 tablet 5  . ADULT MULTIVITAMIN W/MINERALS CH Oral Take 1 tablet by mouth daily.    Marland Kitchen NIFEDIPINE ER OSMOTIC 30 MG PO TB24 Oral Take 30 mg by mouth at bedtime.     . OMEPRAZOLE 20 MG PO CPDR Oral Take 20 mg by mouth 2 (two) times daily.    Marland Kitchen PILOCARPINE HCL 1 % OP SOLN Both Eyes Place 1 drop into both eyes 3 (three) times daily.     Marland Kitchen SOLIFENACIN SUCCINATE 5 MG PO TABS Oral Take 5 mg by mouth every morning.     Marland Kitchen TIOTROPIUM BROMIDE MONOHYDRATE 18 MCG IN CAPS Inhalation Place 18 mcg into inhaler and inhale daily.    . WARFARIN SODIUM 4 MG PO TABS Oral Take 2-8 mg by mouth every evening. At 5pm.  On Monday, take one-half tablet (2mg ). On Tuesday, take 2 tablets (8mg ). On ALL OTHER DAYS, take 1 tablet (4mg ).        BP 132/59  Pulse 65  Temp 97.8 F (36.6 C) (Oral)  Resp 17  SpO2 98%  Physical Exam  Constitutional: She is oriented to person, place, and time. She appears well-developed and well-nourished. No distress.  HENT:  Head: Normocephalic and atraumatic.  Mouth/Throat: Oropharynx is clear and moist.  Eyes: Conjunctivae normal are normal.  Neck: Normal range of motion.  Cardiovascular: Normal rate, regular rhythm, normal heart sounds and intact distal pulses.   Pulses:      Dorsalis pedis pulses are 2+ on the left side.       +2 pitting edema lower extremities bilateral  Pulmonary/Chest: Effort normal and breath sounds normal. No respiratory distress.  Abdominal: Soft. Bowel sounds are normal. There is no tenderness.  Musculoskeletal:       Left lower leg: She exhibits tenderness and bony tenderness. She exhibits no laceration.       Legs: Neurological: She is alert and oriented to person, place, and time. No sensory deficit.  Skin: Skin is warm, dry and intact. Ecchymosis ( 2 in diameter hematoma present on anterior aspect of leftl ower leg  ) noted. No laceration noted.  Psychiatric: She has a normal mood and affect. Her behavior is normal.    ED Course  Procedures (including critical care time)   Labs Reviewed  CBC WITH DIFFERENTIAL  PROTIME-INR  BASIC METABOLIC PANEL   Results for orders placed during the hospital encounter of 03/26/12  CBC WITH DIFFERENTIAL      Component Value Range   WBC 7.2  4.0 - 10.5 K/uL   RBC 4.54  3.87 - 5.11 MIL/uL   Hemoglobin 12.1  12.0 - 15.0 g/dL   HCT 82.9  56.2 - 13.0 %   MCV 81.5  78.0 - 100.0 fL   MCH 26.7  26.0 - 34.0 pg   MCHC 32.7  30.0 - 36.0 g/dL   RDW 86.5  78.4 - 69.6 %   Platelets 224  150 - 400 K/uL   Neutrophils Relative 49  43 - 77 %   Neutro Abs 3.6  1.7 - 7.7 K/uL   Lymphocytes Relative 39  12 - 46 %   Lymphs Abs 2.8  0.7 - 4.0 K/uL   Monocytes Relative 8  3 - 12 %   Monocytes Absolute 0.6  0.1 - 1.0 K/uL    Eosinophils Relative 3  0 - 5 %   Eosinophils Absolute 0.2  0.0 - 0.7 K/uL   Basophils Relative 0  0 - 1 %   Basophils Absolute 0.0  0.0 - 0.1 K/uL  PROTIME-INR      Component Value Range   Prothrombin Time 27.7 (*) 11.6 - 15.2 seconds   INR 2.75 (*) 0.00 - 1.49  BASIC METABOLIC PANEL      Component Value Range   Sodium 142  135 - 145 mEq/L   Potassium 3.7  3.5 - 5.1 mEq/L   Chloride 106  96 - 112 mEq/L   CO2 26  19 - 32 mEq/L   Glucose, Bld 110 (*) 70 - 99 mg/dL   BUN 16  6 - 23 mg/dL   Creatinine, Ser 2.95  0.50 - 1.10 mg/dL   Calcium 9.8  8.4 - 28.4 mg/dL   GFR calc non Af Amer 45 (*) >90 mL/min   GFR calc Af Amer 52 (*) >90 mL/min    Dg Tibia/fibula Left  03/26/2012  *RADIOLOGY REPORT*  Clinical Data: Left lower leg mid shaft pain, swelling and bruising following an injury today.  LEFT TIBIA AND FIBULA - 2 VIEW  Comparison: None.  Findings: Anterior soft  tissue swelling at the level of the mid tibial shaft.  No fracture or dislocation seen.  Small amount of calcification in the marrow cavity of the distal tibia.  IMPRESSION:  1.  No fracture or dislocation. 2.  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   Original Report Authenticated By: Darrol Angel, M.D.      1. Leg hematoma   2. Leg pain       MDM  78 y/o female with blunt trauma to left lower extremity. No open wounds. PT/INR elevated. Distal pulses intact. No pain distal to injury. Positive hx of bilateral DVT's. Patient was able to stand up and take a couple steps without any pain. She is normally ambulatory with a walker. Case discussed with Dr. Manus Gunning will also evaluated patient and feels as if she is stable for discharge. I advised close followup with her PCP on Monday. Return precautions discussed including symptoms of compartment syndrome.        Trevor Mace, PA-C 03/26/12 2145

## 2012-03-26 NOTE — Progress Notes (Signed)
HPI:  78 year old woman presenting for initial cardiac evaluation. This is an elderly woman with underlying dementia. She is blind and essentially wheelchair-bound. I take care of one of her daughters and cardiac evaluation was recommended during 1 of her recent hospitalizations as there was concern about congestive heart failure.  The patient lives at home with her husband. She has a supportive family. She's been hospitalized for encephalopathy and was noted to have uncontrolled hypothyroidism. She also has a history of DVT and has been anticoagulated with warfarin in the past but no longer is on anticoagulant Rx. An echocardiogram was done during one of her hospitalizations and this is paced at below. She was noted to have basal septal hypertrophy.  The patient is unable to provide much history. She does not have chest pain or dyspnea, but she is very sedentary. She has been noted to have swelling in her ankles. She has generalized weakness. She also has bilateral leg pain. She has not had any recent episodes of syncope.  Outpatient Encounter Prescriptions as of 03/26/2012  Medication Sig Dispense Refill  . acetaminophen (TYLENOL) 325 MG tablet Take 650 mg by mouth every 6 (six) hours as needed. For pain      . albuterol (PROVENTIL HFA;VENTOLIN HFA) 108 (90 BASE) MCG/ACT inhaler Inhale 2 puffs into the lungs every 6 (six) hours as needed. For shortness of breath.      Marland Kitchen albuterol-ipratropium (COMBIVENT) 18-103 MCG/ACT inhaler Inhale 1-2 puffs into the lungs every 6 (six) hours as needed. For wheezing      . brimonidine (ALPHAGAN) 0.15 % ophthalmic solution Place 1 drop into both eyes 2 (two) times daily.      . cholecalciferol (VITAMIN D) 1000 UNITS tablet Take 1,000 Units by mouth at bedtime.       . docusate sodium (COLACE) 100 MG capsule Take 100 mg by mouth daily.       . dorzolamide-timolol (COSOPT) 22.3-6.8 MG/ML ophthalmic solution Place 1 drop into both eyes 2 (two) times daily.        .  fentaNYL (DURAGESIC - DOSED MCG/HR) 12 MCG/HR Place 1 patch onto the skin every 3 (three) days.      . Fluticasone-Salmeterol (ADVAIR) 250-50 MCG/DOSE AEPB Inhale 1 puff into the lungs 2 (two) times daily.      Marland Kitchen gabapentin (NEURONTIN) 100 MG capsule Take 100-300 mg by mouth 3 (three) times daily. Pt takes 100mg  3 times daily but takes 300mg  3 times daily when swallowing is easier per family member      . irbesartan (AVAPRO) 75 MG tablet Take 1 tablet (75 mg total) by mouth every morning.  30 tablet  5  . latanoprost (XALATAN) 0.005 % ophthalmic solution Place 1 drop into both eyes at bedtime.        Marland Kitchen letrozole (FEMARA) 2.5 MG tablet Take 2.5 mg by mouth at bedtime.       . levETIRAcetam (KEPPRA) 500 MG tablet Take 500 mg by mouth 2 (two) times daily.       Marland Kitchen levothyroxine (SYNTHROID, LEVOTHROID) 125 MCG tablet Take 1 tablet (125 mcg total) by mouth daily.  30 tablet  5  . Multiple Vitamin (MULTIVITAMIN WITH MINERALS) TABS Take 1 tablet by mouth daily.      Marland Kitchen NIFEdipine (PROCARDIA-XL/ADALAT-CC/NIFEDICAL-XL) 30 MG 24 hr tablet Take 30 mg by mouth at bedtime.       Marland Kitchen omeprazole (PRILOSEC) 20 MG capsule Take 20 mg by mouth 2 (two) times daily.      Marland Kitchen  pilocarpine (PILOCAR) 1 % ophthalmic solution Place 1 drop into both eyes 3 (three) times daily.       . solifenacin (VESICARE) 5 MG tablet Take 5 mg by mouth every morning.       . tiotropium (SPIRIVA) 18 MCG inhalation capsule Place 18 mcg into inhaler and inhale daily.      Marland Kitchen warfarin (COUMADIN) 4 MG tablet Take 1 tablet (4 mg total) by mouth every evening.  35 tablet  1  . fluocinolone (DERMA-SMOOTHE) 0.01 % external oil Apply 1 application topically 3 (three) times daily as needed. For itching      . DISCONTD: oxyCODONE-acetaminophen (PERCOCET/ROXICET) 5-325 MG per tablet         Penicillins; Morphine sulfate; and Sertraline hcl  Past Medical History  Diagnosis Date  . Hypertension   . Hypothyroidism   . Seizure disorder   . History of  cerebrovascular accident   . Severe stage glaucoma     legally blind  . Depression with anxiety   . OAB (overactive bladder)   . ADENOCARCINOMA, LEFT BREAST 04/11/2010    s/p L mastectomy, on femara x 18yr  . BACK PAIN, LUMBAR, CHRONIC   . DVT 05/2007    RLE following R THR - chronic coumadin, chronic RLE pain  . GLAUCOMA   . OSTEOARTHRITIS, HANDS, BILATERAL   . OSTEOPENIA   . Retinal ischemia   . Sciatica of right side     chronic RLE pain, multifactorial - MRI T/L spine 02/2011  . VITAMIN D DEFICIENCY dx 08/2008  . Venous ulcer of right lower extremity with varicose veins   . Dementia     Past Surgical History  Procedure Date  . Total abdominal hysterectomy   . Total hip arthroplasty 10/2006    right hip  . Refractive surgery     B/L  . Breast surgery 03/2010    breast biopsy, L mastectomy   . Tonsillectomy     History   Social History  . Marital Status: Married    Spouse Name: N/A    Number of Children: N/A  . Years of Education: N/A   Occupational History  . Not on file.   Social History Main Topics  . Smoking status: Former Smoker -- 45 years    Types: Cigarettes    Quit date: 10/16/1979  . Smokeless tobacco: Never Used  . Alcohol Use: No  . Drug Use: No  . Sexually Active: No   Other Topics Concern  . Not on file   Social History Narrative   Retired Married     Never Smoked     Alcohol use-no               Family History  Problem Relation Age of Onset  . Ovarian cancer Mother   . Cancer Mother   . Deep vein thrombosis Mother   . Heart disease Mother   . Arthritis Other     grandparents  . Lung cancer Other   . Heart disease Other     parent  . Cancer Father     BRAIN TUMOR  . Cancer Brother   . Hyperlipidemia Brother   . Hypertension Brother   . Stroke Brother   . Arthritis Brother   . Diabetes Daughter   . Heart disease Daughter   . Hypertension Daughter   . Hyperlipidemia Daughter   . Other Daughter     VARICOSE VEINS    ROS:  General: no fevers/chills/night sweats Eyes: Positive  for blindness ENT: no sore throat or hearing loss Resp: no cough, wheezing, or hemoptysis CV: no palpitations GI: no abdominal pain, nausea, vomiting, diarrhea, or constipation GU: no dysuria, frequency, or hematuria Skin: no rash Neuro: no headache, numbness, tingling, or weakness of extremities Musculoskeletal: no joint pain or swelling Heme: no bleeding, DVT, or easy bruising Endo: no polydipsia or polyuria  BP 130/62  Pulse 51  Resp 18  Ht 5\' 3"  (1.6 m)  Wt 74.444 kg (164 lb 1.9 oz)  BMI 29.07 kg/m2  SpO2 91%  PHYSICAL EXAM: Pt is alert and oriented, elderly, frail woman in a wheelchair, in no distress. HEENT: normal Neck: JVP mildly increased. Carotid upstrokes normal without bruits. No thyromegaly. Lungs: equal expansion, clear bilaterally CV: Apex is discrete and nondisplaced, RRR without murmur or gallop Abd: soft, NT, +BS, no bruit, no hepatosplenomegaly Back: no CVA tenderness Ext: 1+ pretibial edema right greater than left Skin: warm and dry without rash Neuro: CNII-XII intact             Strength intact = bilaterally  EKG:  Normal sinus rhythm 64 beats per minute. Within normal limits.  2D Echo: Study Conclusions  - Left ventricle: The cavity size was normal. Moderate asymmetric basal septal hypertrophy. Systolic function was normal. The estimated ejection fraction was in the range of 60% to 65%. Turbulence across LV outflow tract with peak gradient 20 mmHg. Wall motion was normal; there were no regional wall motion abnormalities. Doppler parameters are consistent with abnormal left ventricular relaxation (grade 1 diastolic dysfunction). - Aortic valve: There was no stenosis. - Mitral valve: No mitral valve SAM. Trivial regurgitation. - Right ventricle: The cavity size was normal. Systolic function was normal. - Tricuspid valve: Peak RV-RA gradient: 20mm Hg (S). - Systemic veins: IVC was not  visualized. Impressions:  - Normal LV size with asymmetric basal septal hypertrophy. Given age, this may be a "senile septum" variant. EF 60-65%. Mild LV outflow tract gradient reaching peak 20 mmHg. Normal RV size and systolic function.  ASSESSMENT AND PLAN: Hypertension: The patient's blood pressure appears well-controlled based on her office reading today. Recommend continue Avapro at the current dose.  Basal septal hypertrophy: The patient has a mild outflow tract gradient across her left ventricular outflow tract. This is likely a normal variant in this elderly woman I don't think there is much clinical consequence.  Lower extremity edema: The patient is been evaluated by vascular surgery and compression stockings have been recommended. She has a past history of DVT but is no longer on anticoagulant therapy.  For followup, I do not see any significant cardiac issues and would recommend that she followup as needed.  Tonny Bollman 04/19/2012 6:45 AM

## 2012-03-26 NOTE — ED Notes (Signed)
Pt. Was accidentally hit on the left shin with an oxygen tank that fell of a family members shoulder.

## 2012-03-26 NOTE — ED Provider Notes (Signed)
Medical screening examination/treatment/procedure(s) were conducted as a shared visit with non-physician practitioner(s) and myself.  I personally evaluated the patient during the encounter  Injury to left leg from oxygen tank. Patient on Coumadin for history of DVT. No weakness, numbness or tingling. Hematoma to L shin. +2 DP and PT pulses. Patient has no pain or swelling distal to her hematoma on her shin. Able to wiggle toes, distal sensation and pulses intact. No evidence of compartment syndrome.  Glynn Octave, MD 03/26/12 [phone]

## 2012-03-29 ENCOUNTER — Telehealth: Payer: Self-pay | Admitting: Internal Medicine

## 2012-03-29 ENCOUNTER — Encounter (HOSPITAL_COMMUNITY): Payer: Self-pay | Admitting: *Deleted

## 2012-03-29 ENCOUNTER — Emergency Department (HOSPITAL_COMMUNITY)
Admission: EM | Admit: 2012-03-29 | Discharge: 2012-03-29 | Disposition: A | Discharge status: Home / Self Care | Payer: Medicare Other | Attending: Emergency Medicine | Admitting: Emergency Medicine

## 2012-03-29 DIAGNOSIS — Z86718 Personal history of other venous thrombosis and embolism: Secondary | ICD-10-CM | POA: Insufficient documentation

## 2012-03-29 DIAGNOSIS — H543 Unqualified visual loss, both eyes: Secondary | ICD-10-CM | POA: Insufficient documentation

## 2012-03-29 DIAGNOSIS — I10 Essential (primary) hypertension: Secondary | ICD-10-CM | POA: Insufficient documentation

## 2012-03-29 DIAGNOSIS — R791 Abnormal coagulation profile: Secondary | ICD-10-CM | POA: Insufficient documentation

## 2012-03-29 DIAGNOSIS — Z79899 Other long term (current) drug therapy: Secondary | ICD-10-CM | POA: Insufficient documentation

## 2012-03-29 DIAGNOSIS — M79609 Pain in unspecified limb: Secondary | ICD-10-CM | POA: Insufficient documentation

## 2012-03-29 DIAGNOSIS — Z8673 Personal history of transient ischemic attack (TIA), and cerebral infarction without residual deficits: Secondary | ICD-10-CM | POA: Insufficient documentation

## 2012-03-29 DIAGNOSIS — S8990XA Unspecified injury of unspecified lower leg, initial encounter: Secondary | ICD-10-CM

## 2012-03-29 DIAGNOSIS — F0390 Unspecified dementia without behavioral disturbance: Secondary | ICD-10-CM | POA: Insufficient documentation

## 2012-03-29 DIAGNOSIS — Z7901 Long term (current) use of anticoagulants: Secondary | ICD-10-CM | POA: Insufficient documentation

## 2012-03-29 LAB — PROTIME-INR
INR: 3.21 — ABNORMAL HIGH (ref 0.00–1.49)
Prothrombin Time: 31.1 seconds — ABNORMAL HIGH (ref 11.6–15.2)

## 2012-03-29 NOTE — Telephone Encounter (Signed)
Oxygen tank fell and hit Jennifer Brennan's leg.  She went to the ER this weekend.  She had x-rays.  She is on coumadin.  Her L. leg had a huge knot that is black and red with a blister.  Swollen to her knee.  [facility] is coming today to change the bandage on the R. Leg.  She can't walk at all.  They can't get her here to an appt.  What to do?Marland Kitchen

## 2012-03-29 NOTE — ED Notes (Signed)
Patient given discharge instructions, information, prescriptions, and diet order. Patient states that they adequately understand discharge information given and to return to ED if symptoms return or worsen.     

## 2012-03-29 NOTE — Telephone Encounter (Signed)
Events noted - pt taken to ER - I spoke with PA there thanks

## 2012-03-29 NOTE — ED Provider Notes (Signed)
Complains of pain at left shin onset 3 days ago after an oxygen tank dropped on her leg. No other complaint. On exam patient is alert, answers appropriately left lower extremity with a baseball size hematoma overlying the shin which has been mildly tender DP pulse 2+ There is no signs of compartment syndrome and no evidence of infection or DVT. Given patient's allergies suggest ice elevation.  Doug Sou, MD 03/29/12 1540

## 2012-03-29 NOTE — ED Provider Notes (Signed)
History     CSN: [id number]  Arrival date & time 03/29/12  1409   First MD Initiated Contact with Patient 03/29/12 1425      Chief Complaint  Patient presents with  . Leg Pain    LLE    (Consider location/radiation/quality/duration/timing/severity/associated sxs/prior treatment) HPI  78 year old female with hx of DVT currently on coumadin presents c/o pain to LLE.  Daughter reports pt's O2 tank fell and landed on her LLE 4 days ago.  She was seen in ER for the complaint and was found that her INR was elevated.  She has no evidence of compartment syndrome at that time, her xray .  She was sent home but has noticed that the bruising to the affected site is becoming larger and more painful.   Pain is throughout her leg, even a sheet when laying on top of her skin can cause pain.  She is allergic to Percocet and Morphine, and has only taking tylenol for pain.  She denies numbness or coolness to her feet.  She denies chest pain or shortness of breath. Denies fever or hemoptysis.    Past Medical History  Diagnosis Date  . Hypertension   . Hypothyroidism   . Seizure disorder   . History of cerebrovascular accident   . Severe stage glaucoma     legally blind  . Depression with anxiety   . OAB (overactive bladder)   . ADENOCARCINOMA, LEFT BREAST 04/11/2010    s/p L mastectomy, on femara x 58yr  . BACK PAIN, LUMBAR, CHRONIC   . DVT 05/2007    RLE following R THR - chronic coumadin, chronic RLE pain  . GLAUCOMA   . OSTEOARTHRITIS, HANDS, BILATERAL   . OSTEOPENIA   . Retinal ischemia   . Sciatica of right side     chronic RLE pain, multifactorial - MRI T/L spine 02/2011  . VITAMIN D DEFICIENCY dx 08/2008  . Venous ulcer of right lower extremity with varicose veins   . Dementia     Past Surgical History  Procedure Date  . Total abdominal hysterectomy   . Total hip arthroplasty 10/2006    right hip  . Refractive surgery     B/L  . Breast surgery 03/2010    breast biopsy, L  mastectomy   . Tonsillectomy     Family History  Problem Relation Age of Onset  . Ovarian cancer Mother   . Cancer Mother   . Deep vein thrombosis Mother   . Heart disease Mother   . Arthritis Other     grandparents  . Lung cancer Other   . Heart disease Other     parent  . Cancer Father     BRAIN TUMOR  . Cancer Brother   . Hyperlipidemia Brother   . Hypertension Brother   . Stroke Brother   . Arthritis Brother   . Diabetes Daughter   . Heart disease Daughter   . Hypertension Daughter   . Hyperlipidemia Daughter   . Other Daughter     VARICOSE VEINS    History  Substance Use Topics  . Smoking status: Former Smoker -- 45 years    Types: Cigarettes    Quit date: 10/16/1979  . Smokeless tobacco: Never Used  . Alcohol Use: No    OB History    Grav Para Term Preterm Abortions TAB SAB Ect Mult Living                  Review  of Systems  All other systems reviewed and are negative.    Allergies  Bactrim; Furosemide; Percocet; Valsartan; Penicillins; Morphine sulfate; and Sertraline hcl  Home Medications   Current Outpatient Rx  Name Route Sig Dispense Refill  . ACETAMINOPHEN 325 MG PO TABS Oral Take 650 mg by mouth every 6 (six) hours as needed. For pain    . ALBUTEROL SULFATE HFA 108 (90 BASE) MCG/ACT IN AERS Inhalation Inhale 2 puffs into the lungs every 6 (six) hours as needed. For shortness of breath.    Maximino Greenland 18-103 MCG/ACT IN AERO Inhalation Inhale 1-2 puffs into the lungs every 6 (six) hours as needed. For wheezing    . BRIMONIDINE TARTRATE 0.15 % OP SOLN Both Eyes Place 1 drop into both eyes 2 (two) times daily.    Marland Kitchen VITAMIN D 1000 UNITS PO TABS Oral Take 1,000 Units by mouth at bedtime.     Marland Kitchen DOCUSATE SODIUM 100 MG PO CAPS Oral Take 100 mg by mouth daily.     . DORZOLAMIDE HCL-TIMOLOL MAL 22.3-6.8 MG/ML OP SOLN Both Eyes Place 1 drop into both eyes 2 (two) times daily.      . FENTANYL 12 MCG/HR TD PT72 Transdermal Place 1 patch onto  the skin every 3 (three) days.    Marland Kitchen FLUOCINOLONE ACETONIDE 0.01 % EX OIL Topical Apply 1 application topically 3 (three) times daily as needed. For itching    . FLUTICASONE-SALMETEROL 250-50 MCG/DOSE IN AEPB Inhalation Inhale 1 puff into the lungs 2 (two) times daily.    Marland Kitchen GABAPENTIN 300 MG PO CAPS Oral Take 300 mg by mouth 2 (two) times daily.    . IRBESARTAN 75 MG PO TABS Oral Take 1 tablet (75 mg total) by mouth every morning. 30 tablet 5  . LATANOPROST 0.005 % OP SOLN Both Eyes Place 1 drop into both eyes at bedtime.      Marland Kitchen LETROZOLE 2.5 MG PO TABS Oral Take 2.5 mg by mouth at bedtime.     Marland Kitchen LEVETIRACETAM 500 MG PO TABS Oral Take 500 mg by mouth 2 (two) times daily.     Marland Kitchen LEVOTHYROXINE SODIUM 125 MCG PO TABS Oral Take 1 tablet (125 mcg total) by mouth daily. 30 tablet 5  . ADULT MULTIVITAMIN W/MINERALS CH Oral Take 1 tablet by mouth daily.    Marland Kitchen NIFEDIPINE ER OSMOTIC 30 MG PO TB24 Oral Take 30 mg by mouth at bedtime.     . OMEPRAZOLE 20 MG PO CPDR Oral Take 20 mg by mouth 2 (two) times daily.    Marland Kitchen PILOCARPINE HCL 1 % OP SOLN Both Eyes Place 1 drop into both eyes 3 (three) times daily.     Marland Kitchen SOLIFENACIN SUCCINATE 5 MG PO TABS Oral Take 5 mg by mouth every morning.     Marland Kitchen TIOTROPIUM BROMIDE MONOHYDRATE 18 MCG IN CAPS Inhalation Place 18 mcg into inhaler and inhale daily.    . WARFARIN SODIUM 4 MG PO TABS Oral Take 2-8 mg by mouth every evening. At 5pm.  On Monday, take one-half tablet (2mg ). On Tuesday, take 2 tablets (8mg ). On ALL OTHER DAYS, take 1 tablet (4mg ).      There were no vitals taken for this visit.  Physical Exam  Nursing note and vitals reviewed. Constitutional: She appears well-developed and well-nourished. No distress.  HENT:  Head: Atraumatic.  Eyes: Conjunctivae normal are normal.       Pt is blind  Neck: Neck supple.  Musculoskeletal:  RLE: lower extremities with evidence of skin changes consistent with venous stasis.  Pedal pulse 2+.  LLE: a moderate size  hematoma noted to mid anterior tibia, very tender on palpation.  No deformity, not bleeding.  Pedal pulse and dorsalis pedis 2+.  Sensation intact throughout lower extremities.  Normal ankle ROM.    Neurological: She is alert.  Skin: Skin is warm.    ED Course  Procedures (including critical care time)  Labs Reviewed - No data to display No results found.   No diagnosis found.  Results for orders placed during the hospital encounter of 03/29/12  PROTIME-INR      Component Value Range   Prothrombin Time 31.1 (*) 11.6 - 15.2 seconds   INR 3.21 (*) 0.00 - 1.49    1. LLE hematoma 2. INR supratherapeutic  MDM  Pt has hematoma to LLE in the anterior tibia region.  Area very tender to the touch, however, with normal distal pulse my suspicion is low for compartment syndrome.  Due to the increase swelling, i believe the pressure is worsening her pain.  She only takes tylenol for pain control as she is allergic to stronger narcotic pain meds.  My attending has evaluated pt and felt that ice pack and leg elevation is about the only thing that we can try to decrease pain and swelling.  I will check INR.     4:30 PM Pt has INR of 3.21.  I have consulted with her PCP Dr. Felicity Coyer, who request withholding coumadin x 48hrs.  The office will call to have pt f/u for close follow up and for INR recheck. Pt and family member voice understanding and agrees with plan.  Recommend continue ice pack, and to elevate leg above heart to decrease swelling.  IN light of pt's allergic rxn to narcotic, i recommend only tylenol for pain control.     BP 130/45  Pulse 67  Temp 97.6 F (36.4 C) (Oral)  Resp 16  SpO2 98%  Nursing notes reviewed and considered in documentation  Previous records reviewed and considered  All labs/vitals reviewed and considered  xrays reviewed and considered   Fayrene Helper, PA-C 03/29/12 1633

## 2012-03-29 NOTE — ED Notes (Signed)
Pt's daughter reports an O2 tank fell on her LLE Friday, was seen at [facility] ED to be evaluated.  Daughter reports that hematoma became larger and recently started to go down but pain is getting severe.  She reports that pt does not even want a sheet on her LLE.  Area is very tender.  Large hematoma noted.  Swelling also noted.  Pt's daughter reports pt is blind.

## 2012-03-29 NOTE — ED Notes (Signed)
[facility] called for patient transport 

## 2012-03-30 ENCOUNTER — Telehealth: Payer: Self-pay | Admitting: Internal Medicine

## 2012-03-30 ENCOUNTER — Telehealth: Payer: Self-pay | Admitting: General Practice

## 2012-03-30 NOTE — Telephone Encounter (Signed)
Spoke with [name], Patient's daughter.  Daughter stated pt has been taking 1000 mg of Tylenol every 4 hours.  RN informed dtr that Tylenol is hard on the liver and that Pt should not have more that 4000 mg of Tylenol in a 24 hour period.  RN instructed daughter that pt can have 1000 mg of tylenol every 6 hours.  Also informed daughter that INR will be checked on Wednesday 9-9 by [facility] RN.

## 2012-03-30 NOTE — Telephone Encounter (Signed)
OV - until then, tylenol safest to add to current pain regimen (chronic pain med control)

## 2012-03-30 NOTE — Telephone Encounter (Signed)
[facility] Triage Call Report Triage Record Num: [id number] Operator: Cornell Barman Patient Name: Jennifer Brennan Call Date & Time: 03/27/2012 2:39:31PM Patient Phone: [phone] PCP: [name] Paci Patient Gender: Female PCP Fax : [phone] Patient DOB: 11/18/1923 Practice Name: [facility] Reason for Call: Caller: [name] & Carment/Daughter ; PCP: [name] Paci (Adults only); CB#: [phone]; Daughter is calling regarding her mother injury to her left leg. She states her mothers oxygen tank fell and struck her leg yesterday 03/26/12 . She was sent to [facility] by ambulance for evaluation.There was no fracture but had a large hematoma. She was discharged to home. Daughter states she will not move the leg, they are using Ice, changed her pain patch, Tylenol. Her mother does have OT and has been walking with her walker. The Left Leg below the knee near the calf is swollen, there is no heat, Size near soft ball, hard to touch and has got larger despite ice. She is crying with pain and touching. PT 27.7 and INR 2.75. Emergent s/sx ruled out per Leg Injury Protocol with the exception to "New Onset unbearable Pain" . ED evaluation. Patient with coumadin history, DVT in legs, Injury with increased swelling today . Advised Evaluation. Daughter expressed understnading. Protocol(s) Used: Leg Injury Recommended Outcome per Protocol: See ED Immediately Reason for Outcome: New onset unbearable pain Care Advice: ~ Another adult should drive. ~ Apply cloth-covered ice pack or a cool compress to the area while in transit to reduce pain and swelling. ~ IMMEDIATE ACTION Write down provider's name. List or place the following in a bag for transport with the patient: current prescription and/or nonprescription medications; alternative treatments, therapies and medications; and street drugs. ~ 03/27/2012 3:01:25PM Page 1 of 1 [id number]

## 2012-03-30 NOTE — Telephone Encounter (Signed)
Caller: Joyce/Child; Patient Name: Jennifer Brennan; PCP: [name] Paci (Adults only); Best Call[facility] Phone Number: [phone]; Call regarding: Physical Therapy; has been getting PT twice weekly, Mondays and Wednesdays, to help with walking and strength training d/t wounds to her legs and bilateral DVTs; was carried back to the ED 10/8 after her oxygen tank fell on her Lt leg; leg was bruised and swollen, with some numbness; rt leg also has some swelling; xray neg for fracture; was told to rest her leg; using Gabapentin and Tylenol for pain; daughter calling to see what Dr.Leschber wants them to do about her PT and how long they should rest the legs; also concerned about her pain meds; says ED physician scared to order anything 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

## 2012-03-30 NOTE — Telephone Encounter (Signed)
Pt's daughter informed of MD's advisement, pt's daughter states that they will callback to schedule appointment.

## 2012-03-31 ENCOUNTER — Encounter: Payer: Self-pay | Admitting: Cardiovascular Disease

## 2012-03-31 ENCOUNTER — Ambulatory Visit (INDEPENDENT_AMBULATORY_CARE_PROVIDER_SITE_OTHER): Payer: Medicare Other | Admitting: General Practice

## 2012-03-31 DIAGNOSIS — I6350 Cerebral infarction due to unspecified occlusion or stenosis of unspecified cerebral artery: Secondary | ICD-10-CM

## 2012-03-31 DIAGNOSIS — I639 Cerebral infarction, unspecified: Secondary | ICD-10-CM

## 2012-04-02 ENCOUNTER — Telehealth: Payer: Self-pay

## 2012-04-02 DIAGNOSIS — R5381 Other malaise: Secondary | ICD-10-CM

## 2012-04-02 NOTE — Telephone Encounter (Signed)
Pt's daughter called requesting [facility] referral for physical therapy

## 2012-04-02 NOTE — Telephone Encounter (Signed)
ok 

## 2012-04-02 NOTE — ED Provider Notes (Signed)
Medical screening examination/treatment/procedure(s) were conducted as a shared visit with non-physician practitioner(s) and myself.  I personally evaluated the patient during the encounter  Doug Sou, MD 04/02/12 1210

## 2012-04-05 ENCOUNTER — Telehealth: Payer: Self-pay | Admitting: General Practice

## 2012-04-05 NOTE — Telephone Encounter (Signed)
LMOM.  Follow up call to check on patient's well being.

## 2012-04-06 ENCOUNTER — Telehealth: Payer: Self-pay | Admitting: Internal Medicine

## 2012-04-06 NOTE — Telephone Encounter (Signed)
Caller: Joyce/Child; Patient Name: Jennifer Brennan; PCP: [name] Paci (Adults only); Best Callback Phone Number: [phone] Daughter/Joyce is calling about patient has not been feeling all day.  Patient was complaining of dizziness @ 1454, BP 113/54 P 65 O2 sat 94%.   Daughter put patient into hospital bed, head low and feet elevated, recheck of blood pressure 114/56 P 65 @ 1530.  Patient is now asleep.  Normal blood pressure is unknown as it is not checked.  Daughter is concerned of possible seizures throughout the day and inability to wake patient.   All emergent signs/sx r/o with exception to new seizure now or within last 6 hours per Dizziness or Vertigo protocol.  Advised daughter to call 911 per activate EMS 911 disposition.  She advised this nurse that it needed to be explained to patient's husband.  She attempted to get husband on the phone but he refused to talk with this nurse.  He reports that he will wait until patient wakes up, if she is having pain or problems, will call "paramedics" to take her to hospital.

## 2012-04-07 ENCOUNTER — Telehealth: Payer: Self-pay | Admitting: Internal Medicine

## 2012-04-07 NOTE — Telephone Encounter (Signed)
Please call to check on pt disposition  - if not waking up, taking po or going to BR or fever, pt needs 911 to ER

## 2012-04-07 NOTE — Telephone Encounter (Signed)
Caller: Joyce/Child; Patient Name: Jennifer Brennan; PCP: [name] Paci (Adults only); [facility] Call[facility] Phone Number: [phone] Daughter calling regarding several concerns. Asking what is normal for pt's Blood Pressure and if she should hold meds. BP  on 04/06/12-113/54 @1454 , 114/56 @1500 . BP 10/16 @ am (not sure if time)-113/59. Explained that this was a normal blood pressure and to continue with BP meds as prescribed-they are working. Also concerned b/c pt is bedridden after a leg injury on 09/20. Seen and treated x 2 at the hospital for DVT's and has not f/u because they are unable to get pt up and transported. After trying once, pt had a seizure; family spoke with Neurosurgeon and it was attributed to pain. Pt had a dizzy spell with some confusion yesterday lasting several hrs but fine again after 1800. No sx today and pt is resting comfortably. Declined triage. Merri Brunette to call back immediately or to call 911 if pt had any other sx like dizziness, confusion or unbearable pain. Also advised that some plan for f/u really needed to made and implemented ASAP regarding pt's DVT, PT/INR assessment-very important. [name] verbalized understaning. Spoke with Alvy Beal in office for medical transport ideas for [employer] [phone] to [place] for possible help.

## 2012-04-07 NOTE — Telephone Encounter (Signed)
Called the spoke to the daughter.  The patient is doing better today. No fever, BP is better, took a tylenol and gabapentin around 8 am and no pain now.  Doing much better than yesterday

## 2012-04-07 NOTE — Telephone Encounter (Signed)
noted 

## 2012-04-08 ENCOUNTER — Ambulatory Visit (INDEPENDENT_AMBULATORY_CARE_PROVIDER_SITE_OTHER): Payer: Medicare Other | Admitting: General Practice

## 2012-04-08 DIAGNOSIS — I82409 Acute embolism and thrombosis of unspecified deep veins of unspecified lower extremity: Secondary | ICD-10-CM

## 2012-04-08 DIAGNOSIS — I6350 Cerebral infarction due to unspecified occlusion or stenosis of unspecified cerebral artery: Secondary | ICD-10-CM

## 2012-04-08 DIAGNOSIS — I639 Cerebral infarction, unspecified: Secondary | ICD-10-CM

## 2012-04-08 LAB — POCT INR: INR: 2.9

## 2012-04-12 ENCOUNTER — Telehealth: Payer: Self-pay | Admitting: Internal Medicine

## 2012-04-12 DIAGNOSIS — I872 Venous insufficiency (chronic) (peripheral): Secondary | ICD-10-CM

## 2012-04-12 DIAGNOSIS — J449 Chronic obstructive pulmonary disease, unspecified: Secondary | ICD-10-CM

## 2012-04-12 NOTE — Telephone Encounter (Signed)
PT also needs a humity bottle for her oxygen.  The fax number for [facility] is [phone].

## 2012-04-12 NOTE — Telephone Encounter (Signed)
Please [facility] an RX to [facility] for an air mattress for her hospital bed.

## 2012-04-12 NOTE — Telephone Encounter (Signed)
Ok to generate DME rx for air overlay for hospital bed mattress and for O2 humidifier to [facility] -  Can use icd codes from problem list as needed - thanks

## 2012-04-13 NOTE — Telephone Encounter (Signed)
Orders faxed to [facility].  

## 2012-04-13 NOTE — Telephone Encounter (Signed)
DME orders printed, awaiting MD's signature.

## 2012-04-15 ENCOUNTER — Encounter (HOSPITAL_BASED_OUTPATIENT_CLINIC_OR_DEPARTMENT_OTHER): Payer: Medicare Other | Attending: Internal Medicine

## 2012-04-15 ENCOUNTER — Ambulatory Visit (INDEPENDENT_AMBULATORY_CARE_PROVIDER_SITE_OTHER): Payer: Medicare Other | Admitting: General Practice

## 2012-04-15 ENCOUNTER — Telehealth: Payer: Self-pay | Admitting: *Deleted

## 2012-04-15 DIAGNOSIS — X58XXXA Exposure to other specified factors, initial encounter: Secondary | ICD-10-CM | POA: Insufficient documentation

## 2012-04-15 DIAGNOSIS — T148XXA Other injury of unspecified body region, initial encounter: Secondary | ICD-10-CM | POA: Insufficient documentation

## 2012-04-15 DIAGNOSIS — I6350 Cerebral infarction due to unspecified occlusion or stenosis of unspecified cerebral artery: Secondary | ICD-10-CM

## 2012-04-15 DIAGNOSIS — R627 Adult failure to thrive: Secondary | ICD-10-CM

## 2012-04-15 DIAGNOSIS — I639 Cerebral infarction, unspecified: Secondary | ICD-10-CM

## 2012-04-15 DIAGNOSIS — I872 Venous insufficiency (chronic) (peripheral): Secondary | ICD-10-CM | POA: Insufficient documentation

## 2012-04-15 DIAGNOSIS — I8390 Asymptomatic varicose veins of unspecified lower extremity: Secondary | ICD-10-CM | POA: Insufficient documentation

## 2012-04-15 DIAGNOSIS — I82409 Acute embolism and thrombosis of unspecified deep veins of unspecified lower extremity: Secondary | ICD-10-CM

## 2012-04-15 DIAGNOSIS — R269 Unspecified abnormalities of gait and mobility: Secondary | ICD-10-CM

## 2012-04-15 LAB — POCT INR: INR: 4.1

## 2012-04-15 NOTE — Telephone Encounter (Signed)
Pt's daughter requesting referral for physical therapy for pt-states that pt has been in bed since 10/04 and needs to regain her strength.

## 2012-04-15 NOTE — Telephone Encounter (Signed)
Refer done for [facility]

## 2012-04-16 NOTE — Telephone Encounter (Signed)
Pt's daughter informed of referral.  

## 2012-04-19 ENCOUNTER — Ambulatory Visit: Payer: Medicare Other | Admitting: Internal Medicine

## 2012-04-21 ENCOUNTER — Telehealth: Payer: Self-pay | Admitting: General Practice

## 2012-04-21 NOTE — Telephone Encounter (Signed)
Coumadin clinic received message about patient being taken off [facility].  Called patient's daughter to let her know that message was received and to call coumadin clinic if further questions.

## 2012-04-22 ENCOUNTER — Telehealth: Payer: Self-pay | Admitting: Internal Medicine

## 2012-04-22 ENCOUNTER — Ambulatory Visit (INDEPENDENT_AMBULATORY_CARE_PROVIDER_SITE_OTHER): Payer: Medicare Other | Admitting: Pharmacist

## 2012-04-22 DIAGNOSIS — I82409 Acute embolism and thrombosis of unspecified deep veins of unspecified lower extremity: Secondary | ICD-10-CM

## 2012-04-22 DIAGNOSIS — I6350 Cerebral infarction due to unspecified occlusion or stenosis of unspecified cerebral artery: Secondary | ICD-10-CM

## 2012-04-22 DIAGNOSIS — I639 Cerebral infarction, unspecified: Secondary | ICD-10-CM

## 2012-04-22 NOTE — Telephone Encounter (Signed)
Caller: Joyce/Child; Patient Name: Jennifer Brennan; PCP: [name] Paci (Adults only); [facility] Call[facility] Phone Number: [phone] Joyce/daughter calling regarding greenish/pus like discharge from her vagina/urine, changed her depends and noted when she changed. Looked like alot of pus/mucus. Afebrile. No odor reported. Has some cloudy urine and home health nurse gave her a cup for UA sample which has been unable to collect. Emergent signs and symptoms ruled out as per Vaginal Discharge protocol except for see in 24 hours due to has more than one urinary tract symptom. Appt scheduled for 04/23/12 Dr. Felicity Coyer at 1:45 pm. Call back parameters given.

## 2012-04-23 ENCOUNTER — Ambulatory Visit (INDEPENDENT_AMBULATORY_CARE_PROVIDER_SITE_OTHER): Payer: Medicare Other | Admitting: Internal Medicine

## 2012-04-23 ENCOUNTER — Encounter: Payer: Self-pay | Admitting: Internal Medicine

## 2012-04-23 VITALS — BP 122/72 | HR 75 | Temp 97.9°F

## 2012-04-23 DIAGNOSIS — N390 Urinary tract infection, site not specified: Secondary | ICD-10-CM

## 2012-04-23 DIAGNOSIS — F341 Dysthymic disorder: Secondary | ICD-10-CM

## 2012-04-23 DIAGNOSIS — I639 Cerebral infarction, unspecified: Secondary | ICD-10-CM

## 2012-04-23 DIAGNOSIS — I10 Essential (primary) hypertension: Secondary | ICD-10-CM

## 2012-04-23 DIAGNOSIS — I6350 Cerebral infarction due to unspecified occlusion or stenosis of unspecified cerebral artery: Secondary | ICD-10-CM

## 2012-04-23 MED ORDER — CIPROFLOXACIN HCL 250 MG PO TABS: 250.0000 mg | ORAL_TABLET | Freq: Two times a day (BID) | ORAL | Status: DC

## 2012-04-23 MED ORDER — FLUOXETINE HCL 10 MG PO CAPS: 10.0000 mg | ORAL_CAPSULE | Freq: Every day | ORAL | Status: DC

## 2012-04-23 NOTE — Assessment & Plan Note (Signed)
Mild symptoms per family Will start low dose prozac - erx done

## 2012-04-23 NOTE — Patient Instructions (Signed)
It was good to see you today. We have reviewed your prior records including labs and tests today Take Cipro antibiotics twice daily for one week to help urinary symptoms and infection - Your prescription(s) have been submitted to your pharmacy. Please take as directed and contact our office if you believe you are having problem(s) with the medication(s). Other medications reviewed and updated, okay to hold Avapro for normal pressure we'll make referral to [facility] for home health aid as requested . Our office will contact you regarding appointment(s) once made. Okay to cancel appointment for Monday, reschedule for 3 months, call sooner if problems

## 2012-04-23 NOTE — Assessment & Plan Note (Signed)
BP Readings from Last 3 Encounters:  04/23/12 122/72  03/29/12 117/56  03/26/12 150/70   Will hold ARB at this time given low-normal pressure on possible channel blocker alone Reviewed "normal" BP parameters with family today and importance of compliance with meds as rx'd

## 2012-04-23 NOTE — Assessment & Plan Note (Signed)
Chronic anticoag related to this and hx DVT - Follows with [facility] -

## 2012-04-23 NOTE — Progress Notes (Signed)
Subjective:    Patient ID: Jennifer Brennan, female    DOB: 1924/03/17, 78 y.o.   MRN: [id number]  HPI here for follow up - see CC recurrent DVT 03/12/12 and RLE venous stasis ulcer, ongoing treatment for left extremity venostasis ulcer with palpation wound care clinic and home health Numerous telephone calls and ER visits in interval reviewed today  Past Medical History  Diagnosis Date  . Hypertension   . Hypothyroidism   . Seizure disorder   . History of cerebrovascular accident   . Severe stage glaucoma     legally blind  . Depression with anxiety   . OAB (overactive bladder)   . ADENOCARCINOMA, LEFT BREAST 04/11/2010    s/p L mastectomy, on femara x 37yr  . BACK PAIN, LUMBAR, CHRONIC   . DVT 05/2007    RLE following R THR - chronic coumadin, chronic RLE pain  . GLAUCOMA   . OSTEOARTHRITIS, HANDS, BILATERAL   . OSTEOPENIA   . Retinal ischemia   . Sciatica of right side     chronic RLE pain, multifactorial - MRI T/L spine 02/2011  . VITAMIN D DEFICIENCY dx 08/2008  . Venous ulcer of right lower extremity with varicose veins   . Dementia     Review of Systems  Constitutional: Positive for fatigue. Negative for fever and unexpected weight change.  Respiratory: Negative for cough and shortness of breath.   Cardiovascular: Negative for chest pain and palpitations.  Genitourinary: Positive for dysuria, frequency and enuresis. Negative for hematuria.  Skin: Wound: RLE improving.  Psychiatric/Behavioral: Positive for dysphoric mood and decreased concentration. Negative for self-injury. The patient is not nervous/anxious and is not hyperactive.        Objective:   Physical Exam  BP 122/72  Pulse 75  Temp 97.9 F (36.6 C) (Oral)  SpO2 95% Wt Readings from Last 3 Encounters:  03/26/12 164 lb 1.9 oz (74.444 kg)  03/12/12 163 lb 12.8 oz (74.3 kg)  03/04/12 163 lb 12.8 oz (74.3 kg)   Constitutional: She is overweight but appears well-developed and well-nourished. Sitting in  WC; No distress. dtr and spouse at side Cardiovascular: Normal rate, regular rhythm and normal heart sounds.  No murmur heard. R>L LE edema, trace-1+. Chronic venous changes distal RLE - see below Pulmonary/Chest: Effort normal, breath sounds diminished at bases. No respiratory distress. She has no exp wheezes.  Mskel: Right lower extremity without deformity or effusion. No swelling of joints. Full range of motion active and passive in knee, ankle and toes. Skin: chronic RLE distal venous changes/darkening with slight erythema. Unna wrapping on lower LLE Psychiatric: Withdrawn, dysphoric mood. No tearfulness. Slow to respond. Flat affect. Limited judgment and insight  Lab Results  Component Value Date   WBC 7.2 03/26/2012   HGB 12.1 03/26/2012   HCT 37.0 03/26/2012   PLT 224 03/26/2012   GLUCOSE 110* 03/26/2012   CHOL 249* 02/28/2012   TRIG 130 02/28/2012   HDL 55 02/28/2012   LDLCALC 324* 02/28/2012   ALT 15 03/02/2012   AST 18 03/02/2012   NA 142 03/26/2012   K 3.7 03/26/2012   CL 106 03/26/2012   CREATININE 1.08 03/26/2012   BUN 16 03/26/2012   CO2 26 03/26/2012   TSH 27.420* 03/04/2012   INR 3.8 04/22/2012   HGBA1C 5.7 08/06/2007      Assessment & Plan:   UTI suspected -Dysuria with confusion, increase urinary incontinence  S/p recent evaluation by uro, but unable to collect sample tx empirically  cipro x 7 d  See problem list. Medications and labs reviewed today.

## 2012-04-26 ENCOUNTER — Ambulatory Visit: Payer: Medicare Other | Admitting: Internal Medicine

## 2012-04-27 ENCOUNTER — Ambulatory Visit (INDEPENDENT_AMBULATORY_CARE_PROVIDER_SITE_OTHER): Payer: Medicare Other | Admitting: General Practice

## 2012-04-27 ENCOUNTER — Other Ambulatory Visit: Payer: Medicare Other | Admitting: Lab

## 2012-04-27 ENCOUNTER — Other Ambulatory Visit (HOSPITAL_BASED_OUTPATIENT_CLINIC_OR_DEPARTMENT_OTHER): Payer: Medicare Other | Admitting: Lab

## 2012-04-27 DIAGNOSIS — E559 Vitamin D deficiency, unspecified: Secondary | ICD-10-CM

## 2012-04-27 DIAGNOSIS — C50919 Malignant neoplasm of unspecified site of unspecified female breast: Secondary | ICD-10-CM

## 2012-04-27 DIAGNOSIS — I82409 Acute embolism and thrombosis of unspecified deep veins of unspecified lower extremity: Secondary | ICD-10-CM

## 2012-04-27 DIAGNOSIS — I6350 Cerebral infarction due to unspecified occlusion or stenosis of unspecified cerebral artery: Secondary | ICD-10-CM

## 2012-04-27 DIAGNOSIS — I639 Cerebral infarction, unspecified: Secondary | ICD-10-CM

## 2012-04-27 LAB — CBC WITH DIFFERENTIAL/PLATELET
BASO%: 0.6 % (ref 0.0–2.0)
Basophils Absolute: 0 10*3/uL (ref 0.0–0.1)
EOS%: 3.1 % (ref 0.0–7.0)
Eosinophils Absolute: 0.2 10*3/uL (ref 0.0–0.5)
HCT: 36.1 % (ref 34.8–46.6)
HGB: 11.9 g/dL (ref 11.6–15.9)
LYMPH%: 35 % (ref 14.0–49.7)
MCH: 27 pg (ref 25.1–34.0)
MCHC: 33 g/dL (ref 31.5–36.0)
MCV: 81.9 fL (ref 79.5–101.0)
MONO#: 0.7 10*3/uL (ref 0.1–0.9)
MONO%: 8.9 % (ref 0.0–14.0)
NEUT#: 4 10*3/uL (ref 1.5–6.5)
NEUT%: 52.4 % (ref 38.4–76.8)
Platelets: 274 10*3/uL (ref 145–400)
RBC: 4.41 10*6/uL (ref 3.70–5.45)
RDW: 14.5 % (ref 11.2–14.5)
WBC: 7.6 10*3/uL (ref 3.9–10.3)
lymph#: 2.6 10*3/uL (ref 0.9–3.3)

## 2012-04-27 LAB — COMPREHENSIVE METABOLIC PANEL (CC13)
BUN: 15 mg/dL (ref 7.0–26.0)
CO2: 28 mEq/L (ref 22–29)
Calcium: 9.8 mg/dL (ref 8.4–10.4)
Chloride: 105 mEq/L (ref 98–107)
Creatinine: 1.2 mg/dL — ABNORMAL HIGH (ref 0.6–1.1)
Glucose: 102 mg/dl — ABNORMAL HIGH (ref 70–99)
Total Bilirubin: 0.44 mg/dL (ref 0.20–1.20)

## 2012-04-27 LAB — POCT INR: INR: 2.3

## 2012-04-29 ENCOUNTER — Other Ambulatory Visit: Payer: Self-pay | Admitting: *Deleted

## 2012-04-29 MED ORDER — FENTANYL 12 MCG/HR TD PT72: 1.0000 | MEDICATED_PATCH | TRANSDERMAL | Status: DC

## 2012-04-29 NOTE — Telephone Encounter (Signed)
Left msg on vm mom is needing renewal on her fentanyl patch...Raechel Chute

## 2012-04-29 NOTE — Telephone Encounter (Signed)
Notified daughter [name]) rx ready for pick-up...Raechel Chute

## 2012-05-04 ENCOUNTER — Telehealth: Payer: Self-pay | Admitting: Oncology

## 2012-05-04 ENCOUNTER — Ambulatory Visit (HOSPITAL_BASED_OUTPATIENT_CLINIC_OR_DEPARTMENT_OTHER): Payer: Medicare Other | Admitting: Physician Assistant

## 2012-05-04 ENCOUNTER — Ambulatory Visit: Payer: Medicare Other | Admitting: Oncology

## 2012-05-04 ENCOUNTER — Encounter: Payer: Self-pay | Admitting: Physician Assistant

## 2012-05-04 VITALS — BP 134/72 | HR 63 | Temp 98.4°F | Resp 20 | Ht 63.0 in | Wt 169.5 lb

## 2012-05-04 DIAGNOSIS — Z170 Estrogen receptor positive status [ER+]: Secondary | ICD-10-CM

## 2012-05-04 DIAGNOSIS — Z853 Personal history of malignant neoplasm of breast: Secondary | ICD-10-CM

## 2012-05-04 DIAGNOSIS — C50912 Malignant neoplasm of unspecified site of left female breast: Secondary | ICD-10-CM

## 2012-05-04 DIAGNOSIS — C50219 Malignant neoplasm of upper-inner quadrant of unspecified female breast: Secondary | ICD-10-CM

## 2012-05-04 DIAGNOSIS — E559 Vitamin D deficiency, unspecified: Secondary | ICD-10-CM

## 2012-05-04 DIAGNOSIS — Z86718 Personal history of other venous thrombosis and embolism: Secondary | ICD-10-CM

## 2012-05-04 MED ORDER — LETROZOLE 2.5 MG PO TABS: 2.5000 mg | ORAL_TABLET | Freq: Every day | ORAL | Status: DC

## 2012-05-04 NOTE — Telephone Encounter (Signed)
gve the pt's husband the may 2014 appt calendar along with the mammo/bone density at the [facility] center for nov.

## 2012-05-04 NOTE — Progress Notes (Signed)
ID: Timmothy Euler   DOB: 1924/03/25  MR#: [id number]  CSN#:[id number]  PCP: [name] Paci, MD GYN:  SU:  OTHER MD:   HISTORY OF PRESENT ILLNESS: The patient had some pain in her left breast associated with a palpable abnormality. She brought this to Dr. Mechele Collin attention and he set her up for bilateral diagnostic mammography and left ultrasonography which was performed October 11th, 2011 at [facility]. Dr. Manson Passey found a spiculated mass in the upper inner quadrant of the left breast which was palpable and associated with skin dimpling. Ultrasound shows a hypoechoic mass with acoustic shadowing, spiculated, measuring 2.0 cm. The left axilla showed no enlarged lymph nodes. Biopsy was performed the same day and showed (invasive ductal carcinoma, Grade 2, with some scirrhous changes. The tumor was estrogen 97% positive, progesterone 64% positive with a proliferative marker of 18% and no amplification of Her-2 by CISH with a ratio of 0.94.  With this information, the patient was referred to Dr. Magnus Ivan, and she was felt not to be an MRI candidate because of her multiple medical problems as listed below. The case was presented at the Multidisciplinary Breast Cancer Conference on October 19th and the consensus there was that the patient was a good candidate for breast conserving surgery followed by hormones.  Accordingly, the patient is status post left mastectomy and sentinel lymph node sampling in November of 2011 for a T2 N0, grade 3 invasive ductal carcinoma. Tumor was strongly ER and PR positive, HER-2/neu negative, with a borderline MIP-1. She began letrozole in May of 2012, 2.5 mg daily with good tolerance.     INTERVAL HISTORY: Donnielle returns today accompanied by her husband and her daughter for routine followup of her left breast carcinoma.  She continues on letrozole with good tolerance. She "stays cold all the time" but denies any hot flashes. She has pain in the lower extremities, not  associated with the letrozole.  Interval history is remarkable for multiple hospitalizations and visits to the emergency room since her last visit here in March. Fortunately, none of these were associated with her breast cancer diagnosis.   REVIEW OF SYSTEMS: Salah has had no recent fevers or chills. She was recently treated for urinary tract infection, and those symptoms have resolved. She is eating fairly well and denies any chronic nausea. No change in bowel habits. She has no new cough, shortness of breath, or chest pain. She has some confusion and dementia. She denies any abnormal headaches. She has a history of DVT's with subsequent swelling in her lower extremities. She also has pain in the lower extremities.  A detailed review of systems is otherwise stable and noncontributory.   PAST MEDICAL HISTORY: Past Medical History  Diagnosis Date  . Hypertension   . Hypothyroidism   . Seizure disorder   . History of cerebrovascular accident   . Severe stage glaucoma     legally blind  . Depression with anxiety   . OAB (overactive bladder)   . ADENOCARCINOMA, LEFT BREAST 04/11/2010    s/p L mastectomy, on femara x 64yr  . BACK PAIN, LUMBAR, CHRONIC   . DVT 05/2007    RLE following R THR - chronic coumadin, chronic RLE pain  . GLAUCOMA   . OSTEOARTHRITIS, HANDS, BILATERAL   . OSTEOPENIA   . Retinal ischemia   . Sciatica of right side     chronic RLE pain, multifactorial - MRI T/L spine 02/2011  . VITAMIN D DEFICIENCY dx 08/2008  . Venous  ulcer of right lower extremity with varicose veins   . Dementia     PAST SURGICAL HISTORY: Past Surgical History  Procedure Date  . Total abdominal hysterectomy   . Total hip arthroplasty 10/2006    right hip  . Refractive surgery     B/L  . Breast surgery 03/2010    breast biopsy, L mastectomy   . Tonsillectomy     FAMILY HISTORY Family History  Problem Relation Age of Onset  . Ovarian cancer Mother   . Cancer Mother   . Deep vein  thrombosis Mother   . Heart disease Mother   . Arthritis Other     grandparents  . Lung cancer Other   . Heart disease Other     parent  . Cancer Father     BRAIN TUMOR  . Cancer Brother   . Hyperlipidemia Brother   . Hypertension Brother   . Stroke Brother   . Arthritis Brother   . Diabetes Daughter   . Heart disease Daughter   . Hypertension Daughter   . Hyperlipidemia Daughter   . Other Daughter     VARICOSE VEINS    GYNECOLOGIC HISTORY: She is GX P4. First pregnancy to term at age 87. She went through the change of life at the age of 74 and did take hormones for several years but she cannot recall how many.   SOCIAL HISTORY: The patient's husband of 43 years, Ciona Mess, used to work as a Facilities manager in a children's program but now "I am her caretaker." He has some hearing loss. Daughter Kirby Funk lives in [place], is retired. Daughter Therapist, art also lives in [place]. She is disabled secondary to rheumatoid arthritis. Daughter Lavell Luster lives in [place]. She is disabled secondary to sickle cell disease. The patient is a Systems analyst.    ADVANCED DIRECTIVES:  She has a Living Will in place although I do not have a copy of it.   HEALTH MAINTENANCE: History  Substance Use Topics  . Smoking status: Former Smoker -- 45 years    Types: Cigarettes    Quit date: 10/16/1979  . Smokeless tobacco: Never Used  . Alcohol Use: No     Colonoscopy:  PAP:  Bone density:  Lipid panel:  Allergies  Allergen Reactions  . Bactrim (Sulfamethoxazole W-Trimethoprim) Other (See Comments)    MD stopped due to kidney failure  . Furosemide Other (See Comments)    MD stopped due to kidney failure  . Percocet (Oxycodone-Acetaminophen) Other (See Comments)    Causes SEIZURES  . Valsartan Other (See Comments)    MD stopped due to kidney failure  . Penicillins Hives  . Morphine Sulfate Itching and Rash  . Sertraline Hcl Rash    Current Outpatient Prescriptions    Medication Sig Dispense Refill  . acetaminophen (TYLENOL) 500 MG tablet Take 1,000 mg by mouth every 6 (six) hours as needed. pain      . albuterol (PROVENTIL HFA;VENTOLIN HFA) 108 (90 BASE) MCG/ACT inhaler Inhale 2 puffs into the lungs every 6 (six) hours as needed. For shortness of breath.      Marland Kitchen albuterol-ipratropium (COMBIVENT) 18-103 MCG/ACT inhaler Inhale 1-2 puffs into the lungs every 6 (six) hours as needed. For wheezing      . brimonidine (ALPHAGAN) 0.15 % ophthalmic solution Place 1 drop into both eyes 2 (two) times daily.      . cholecalciferol (VITAMIN D) 1000 UNITS tablet Take 1,000 Units by mouth at bedtime.       Marland Kitchen  ciprofloxacin (CIPRO) 250 MG tablet Take 1 tablet (250 mg total) by mouth 2 (two) times daily.  14 tablet  0  . docusate sodium (COLACE) 100 MG capsule Take 100 mg by mouth daily.       . dorzolamide-timolol (COSOPT) 22.3-6.8 MG/ML ophthalmic solution Place 1 drop into both eyes 2 (two) times daily.        . fentaNYL (DURAGESIC - DOSED MCG/HR) 12 MCG/HR Place 1 patch (12.5 mcg total) onto the skin every 3 (three) days.  10 patch  0  . FLUoxetine (PROZAC) 10 MG capsule Take 1 capsule (10 mg total) by mouth daily.  30 capsule  3  . Fluticasone-Salmeterol (ADVAIR) 250-50 MCG/DOSE AEPB Inhale 1 puff into the lungs 2 (two) times daily.      Marland Kitchen gabapentin (NEURONTIN) 300 MG capsule Take 300 mg by mouth 2 (two) times daily.      Marland Kitchen latanoprost (XALATAN) 0.005 % ophthalmic solution Place 1 drop into both eyes at bedtime.        Marland Kitchen letrozole (FEMARA) 2.5 MG tablet Take 1 tablet (2.5 mg total) by mouth at bedtime.  90 tablet  3  . levETIRAcetam (KEPPRA) 500 MG tablet Take 500 mg by mouth 2 (two) times daily.       Marland Kitchen levothyroxine (SYNTHROID, LEVOTHROID) 125 MCG tablet Take 1 tablet (125 mcg total) by mouth daily.  30 tablet  5  . mirabegron ER (MYRBETRIQ) 50 MG TB24 Take 50 mg by mouth daily.      . Multiple Vitamin (MULTIVITAMIN WITH MINERALS) TABS Take 1 tablet by mouth daily.       Marland Kitchen NIFEdipine (PROCARDIA-XL/ADALAT-CC/NIFEDICAL-XL) 30 MG 24 hr tablet Take 30 mg by mouth at bedtime.       Marland Kitchen omeprazole (PRILOSEC) 20 MG capsule Take 20 mg by mouth 2 (two) times daily.      . pilocarpine (PILOCAR) 1 % ophthalmic solution Place 1 drop into both eyes 3 (three) times daily.       Marland Kitchen tiotropium (SPIRIVA) 18 MCG inhalation capsule Place 18 mcg into inhaler and inhale daily.      Marland Kitchen warfarin (COUMADIN) 4 MG tablet Take 4-8 mg by mouth daily. On Tuesday, take 2 tablets (8mg ). On ALL OTHER DAYS, take 1 tablet (4mg ).      . [DISCONTINUED] letrozole (FEMARA) 2.5 MG tablet Take 2.5 mg by mouth at bedtime.         OBJECTIVE: Elderly African American female who appears comfortable, examined in a wheelchair Filed Vitals:   05/04/12 1348  BP: 134/72  Pulse: 63  Temp: 98.4 F (36.9 C)  Resp: 20     Body mass index is 30.03 kg/(m^2).    ECOG FS: 2 Filed Weights   05/04/12 1348  Weight: 169 lb 8 oz (76.885 kg)   Sclerae unicteric Oropharynx clear No cervical or supraclavicular adenopathy Lungs no rales or rhonchi Heart regular rate and rhythm Abd nontender with positive bowel sounds MSK notable for 1+ pitting edema bilaterally in the lower extremities with tenderness to touch. Neuro: nonfocal Breasts: Right breast is unremarkable. Patient is status post left mastectomy with no evidence of local recurrence. Axillae are benign bilaterally.   LAB RESULTS: Lab Results  Component Value Date   WBC 7.6 04/27/2012   NEUTROABS 4.0 04/27/2012   HGB 11.9 04/27/2012   HCT 36.1 04/27/2012   MCV 81.9 04/27/2012   PLT 274 04/27/2012      Chemistry      Component Value Date/Time  NA 141 04/27/2012 1310   NA 142 03/26/2012 2004   K 4.1 04/27/2012 1310   K 3.7 03/26/2012 2004   CL 105 04/27/2012 1310   CL 106 03/26/2012 2004   CO2 28 04/27/2012 1310   CO2 26 03/26/2012 2004   BUN 15.0 04/27/2012 1310   BUN 16 03/26/2012 2004   CREATININE 1.2* 04/27/2012 1310   CREATININE 1.08 03/26/2012 2004    CREATININE 1.14* 01/20/2011 1516      Component Value Date/Time   CALCIUM 9.8 04/27/2012 1310   CALCIUM 9.8 03/26/2012 2004   ALKPHOS 66 04/27/2012 1310   ALKPHOS 45 03/02/2012 0530   AST 20 04/27/2012 1310   AST 18 03/02/2012 0530   ALT 17 04/27/2012 1310   ALT 15 03/02/2012 0530   BILITOT 0.44 04/27/2012 1310   BILITOT 0.3 03/02/2012 0530       Lab Results  Component Value Date   LABCA2 29 04/27/2012     STUDIES:  Most recent right mammogram was at [facility] in October 2012, unremarkable.    ASSESSMENT: 78 y.o. [employer] woman  (1)  status post left mastectomy and sentinel lymph node sampling November 2011 for a T2 N0, grade 3 invasive ductal carcinoma which was strongly estrogen and progesterone-receptor positive, HER-2-negative, with a borderline MIB-1.   (2)  She finally started letrozole May 2012 and is tolerating it without unusual side effects.    PLAN: Adessa appears to be doing well with regards to her breast cancer. She will continue on letrozole 2.5 mg daily, and I have refilled that prescription for her. She is due for both a right mammogram and bone density, and we will order both of those at [facility] later this month. Otherwise, Analyse will return in 6 months for repeat labs and exam.  She and her family know to call with any changes or problems.   Jamerion Cabello    05/04/2012

## 2012-05-04 NOTE — Telephone Encounter (Signed)
x

## 2012-05-04 NOTE — Patient Instructions (Signed)
No changes in treatment plan.  Continue on letrozole, 2.5 mg daily.  This has been refilled at [facility].  Mammo in Nov 2013.  Return for labs and visit in 6 months, May 2014.  Call with problems or questions.   [phone]

## 2012-05-06 ENCOUNTER — Encounter (HOSPITAL_BASED_OUTPATIENT_CLINIC_OR_DEPARTMENT_OTHER): Payer: Medicare Other | Attending: Internal Medicine

## 2012-05-06 DIAGNOSIS — I872 Venous insufficiency (chronic) (peripheral): Secondary | ICD-10-CM | POA: Insufficient documentation

## 2012-05-06 DIAGNOSIS — I83009 Varicose veins of unspecified lower extremity with ulcer of unspecified site: Secondary | ICD-10-CM | POA: Insufficient documentation

## 2012-05-06 DIAGNOSIS — X58XXXA Exposure to other specified factors, initial encounter: Secondary | ICD-10-CM | POA: Insufficient documentation

## 2012-05-06 DIAGNOSIS — S8010XA Contusion of unspecified lower leg, initial encounter: Secondary | ICD-10-CM | POA: Insufficient documentation

## 2012-05-07 ENCOUNTER — Ambulatory Visit (INDEPENDENT_AMBULATORY_CARE_PROVIDER_SITE_OTHER): Payer: Medicare Other | Admitting: General Practice

## 2012-05-07 DIAGNOSIS — I6350 Cerebral infarction due to unspecified occlusion or stenosis of unspecified cerebral artery: Secondary | ICD-10-CM

## 2012-05-07 DIAGNOSIS — I639 Cerebral infarction, unspecified: Secondary | ICD-10-CM

## 2012-05-07 LAB — POCT INR: INR: 2.7

## 2012-05-08 ENCOUNTER — Other Ambulatory Visit: Payer: Self-pay | Admitting: Internal Medicine

## 2012-05-10 ENCOUNTER — Telehealth: Payer: Self-pay | Admitting: Internal Medicine

## 2012-05-10 NOTE — Telephone Encounter (Signed)
Patient Information:  Caller Name: [name]  Phone: [phone]  Patient: Jennifer Brennan, Jennifer Brennan  Gender: Female  DOB: August 15, 1923  Age: 78 Years  PCP: [name] Paci (Adults only)   Symptoms  Reason For Call & Symptoms: Daughter calling and states patient has been staying awake in evening due to confusion. Started new medicine 11-4  Reviewed Health History In EMR: Yes  Reviewed Medications In EMR: Yes  Reviewed Allergies In EMR: Yes  Date of Onset of Symptoms: 05/10/2012  Guideline(s) Used:  Confusion - Delirium  Disposition Per Guideline:   See Today in Office  Reason For Disposition Reached:   Transient confusion (i.e., completely resolved)  Advice Given:  N/A  Office Follow Up:  Does the office need to follow up with this patient?: No  Instructions For The Office: N/A  RN Overrode Recommendation:  Make Appointment  Daughter wants appointment 11-19 and states patient is safe and she is staying with her during night.  Appointment Scheduled:  05/11/2012 10:45:00

## 2012-05-11 ENCOUNTER — Ambulatory Visit: Payer: Self-pay | Admitting: Internal Medicine

## 2012-05-13 ENCOUNTER — Encounter: Payer: Self-pay | Admitting: Vascular Surgery

## 2012-05-13 ENCOUNTER — Telehealth: Payer: Self-pay | Admitting: *Deleted

## 2012-05-13 NOTE — Telephone Encounter (Signed)
Per [facility] HHRN, pt is in a great deal of pain today-she slept in different bed than usual last night and it has been difficult to move pt today. Pt has Duragesic patch and also has taken Gabapentin and 2 Extra Strength Tylenol today-they are asking for MD's advisement on what pt can do or if she needs to be seen today.

## 2012-05-13 NOTE — Telephone Encounter (Signed)
[facility] HHRN informed of MD's advisement.

## 2012-05-13 NOTE — Telephone Encounter (Signed)
Take 2 extra strength tylenol every 4 hours during the day today - 8 tabs in 24h ok Also increase gabapentin to 300mg  tid OV if still unimproved tomorrow

## 2012-05-14 ENCOUNTER — Ambulatory Visit: Payer: Medicare Other | Admitting: Vascular Surgery

## 2012-05-14 ENCOUNTER — Ambulatory Visit (INDEPENDENT_AMBULATORY_CARE_PROVIDER_SITE_OTHER): Payer: Medicare Other | Admitting: General Practice

## 2012-05-14 DIAGNOSIS — I6350 Cerebral infarction due to unspecified occlusion or stenosis of unspecified cerebral artery: Secondary | ICD-10-CM

## 2012-05-14 DIAGNOSIS — I639 Cerebral infarction, unspecified: Secondary | ICD-10-CM

## 2012-05-14 DIAGNOSIS — I82409 Acute embolism and thrombosis of unspecified deep veins of unspecified lower extremity: Secondary | ICD-10-CM

## 2012-05-14 LAB — POCT INR: INR: 2.5

## 2012-05-21 ENCOUNTER — Telehealth: Payer: Self-pay | Admitting: *Deleted

## 2012-05-21 NOTE — Telephone Encounter (Signed)
Pt has recently started Mybertiq 50 ER mg and now has some itching in her legs and a rash across her back (Per RN these are some of the side effects). Around the same time pt also started using a different manufacturer of her Fentanyl patches. Pt has history of Stevens-Jaena Brocato syndrome, so they wanted MD to be aware.

## 2012-05-21 NOTE — Telephone Encounter (Signed)
Ash, Thank you for letting me know.  [name] Kocher

## 2012-05-24 ENCOUNTER — Other Ambulatory Visit: Payer: Self-pay | Admitting: General Practice

## 2012-05-24 DIAGNOSIS — I82409 Acute embolism and thrombosis of unspecified deep veins of unspecified lower extremity: Secondary | ICD-10-CM

## 2012-05-24 DIAGNOSIS — I639 Cerebral infarction, unspecified: Secondary | ICD-10-CM

## 2012-05-24 MED ORDER — ENOXAPARIN SODIUM 80 MG/0.8ML ~~LOC~~ SOLN: 80.0000 mg | Freq: Every day | SUBCUTANEOUS | Status: DC

## 2012-05-24 NOTE — Telephone Encounter (Signed)
12-1 - Last dose of coumadin 12-2 - No Lovenox - No Coumadin 12-3 - Take Lovenox - No Coumadin 12-4 - Take Lovenox - No Coumadin 12-5 - Take Lovenox - No Coumadin 12-6 - Spinal injection - Do not take Coumadin and Do not take Lovenox 12-7 - Take Lovenox and take 6 mg of coumadin 12-8 - Take Lovenox and take 6 mg of coumadin 12-9 - Take Lovenox and 4 mg of coumadin 12-10 - Take Lovenox and 4 mg of coumadin 12-11 - Home health RN with check INR  Patient's spouse took instructions and repeated the instructions back to me.  Verbalized to spouse that patient needs to be checked in coumadin clinic before going to orthopedics office for injection.

## 2012-05-25 ENCOUNTER — Telehealth: Payer: Self-pay | Admitting: *Deleted

## 2012-05-25 LAB — POCT INR: INR: 2.3

## 2012-05-25 NOTE — Telephone Encounter (Signed)
Ok for verbal 

## 2012-05-25 NOTE — Telephone Encounter (Signed)
[facility] needs order to do Lovenox, verbal ok.

## 2012-05-25 NOTE — Telephone Encounter (Signed)
lovenox was sent to [facility]  - dec 2  Cindy to d/w pt

## 2012-05-25 NOTE — Telephone Encounter (Signed)
Jennifer Brennan informed to contact [facility]

## 2012-05-25 NOTE — Telephone Encounter (Signed)
Left msg on vm stating check PT/ INR on yesterday. PT 2.3 & INR 27.7. Pt has been off her coumadin since Sunday. Suppose to be starting Lovenox, but she doesn't have an order or meds. Preparing for injection in spine on Friday....lmb

## 2012-05-25 NOTE — Telephone Encounter (Signed)
MD is out of office. Pls advise.../lmb 

## 2012-05-26 ENCOUNTER — Ambulatory Visit (INDEPENDENT_AMBULATORY_CARE_PROVIDER_SITE_OTHER): Payer: Medicare Other | Admitting: General Practice

## 2012-05-26 ENCOUNTER — Telehealth: Payer: Self-pay | Admitting: General Practice

## 2012-05-26 DIAGNOSIS — I6350 Cerebral infarction due to unspecified occlusion or stenosis of unspecified cerebral artery: Secondary | ICD-10-CM

## 2012-05-26 DIAGNOSIS — I82409 Acute embolism and thrombosis of unspecified deep veins of unspecified lower extremity: Secondary | ICD-10-CM

## 2012-05-26 DIAGNOSIS — I639 Cerebral infarction, unspecified: Secondary | ICD-10-CM

## 2012-05-26 NOTE — Telephone Encounter (Signed)
Spoke with Daughter, [name] to review instructions for coumadin and lovenox prior to spinal injection and post spinal injection.  Daughter repeated back instructions and verbalized that instructions are understood.  Instructions include: 12-1 - last dose of coumadin 12-2 - take nothing 12-3 - Lovenox - Q24H - No coumadin 12-4 - Lovenox - Q24H - No coumadin 12-5 - Lovenox - Q24H - No coumadin 12-6 - No coumadin No Lovenox 12-7 - Take 6 mg coumadin and Lovenox 12-8 - Take 6 mg coumadin and Lovenox 12-9 - Take 4 mg coumadin and Lovenox 12-10 - Take 4 mg coumadin and Lovenox 12-11 - HH RN to check INR and call coumadin clinic with results. Orders faxed to [facility].

## 2012-05-27 ENCOUNTER — Encounter (HOSPITAL_BASED_OUTPATIENT_CLINIC_OR_DEPARTMENT_OTHER): Payer: Medicare Other | Attending: Internal Medicine

## 2012-05-27 DIAGNOSIS — L97809 Non-pressure chronic ulcer of other part of unspecified lower leg with unspecified severity: Secondary | ICD-10-CM | POA: Insufficient documentation

## 2012-05-27 DIAGNOSIS — X58XXXA Exposure to other specified factors, initial encounter: Secondary | ICD-10-CM | POA: Insufficient documentation

## 2012-05-27 DIAGNOSIS — I8390 Asymptomatic varicose veins of unspecified lower extremity: Secondary | ICD-10-CM | POA: Insufficient documentation

## 2012-05-27 DIAGNOSIS — I872 Venous insufficiency (chronic) (peripheral): Secondary | ICD-10-CM | POA: Insufficient documentation

## 2012-05-27 DIAGNOSIS — S8010XA Contusion of unspecified lower leg, initial encounter: Secondary | ICD-10-CM | POA: Insufficient documentation

## 2012-05-28 ENCOUNTER — Ambulatory Visit (INDEPENDENT_AMBULATORY_CARE_PROVIDER_SITE_OTHER): Payer: Medicare Other | Admitting: General Practice

## 2012-05-28 DIAGNOSIS — I639 Cerebral infarction, unspecified: Secondary | ICD-10-CM

## 2012-05-28 DIAGNOSIS — I6350 Cerebral infarction due to unspecified occlusion or stenosis of unspecified cerebral artery: Secondary | ICD-10-CM

## 2012-05-28 DIAGNOSIS — I82409 Acute embolism and thrombosis of unspecified deep veins of unspecified lower extremity: Secondary | ICD-10-CM

## 2012-05-28 LAB — POCT INR: INR: 1.2

## 2012-05-30 ENCOUNTER — Other Ambulatory Visit: Payer: Self-pay | Admitting: Internal Medicine

## 2012-05-31 ENCOUNTER — Telehealth: Payer: Self-pay | Admitting: Family Medicine

## 2012-05-31 NOTE — Telephone Encounter (Signed)
Noted -thanks Med list updated

## 2012-05-31 NOTE — Telephone Encounter (Signed)
Daughter advised.  Daughter says she is doing better but she thinks it is because she stopped one of her medications.  She says she called the Urologist and told them that she had stopped it.  The medication is Myrbetriq ER 50 mg.  She says the patient is no longer hallucinating or having any or the problems that she was having previously.

## 2012-05-31 NOTE — Telephone Encounter (Addendum)
Received call on 05/30/2012 with sxs of increased confusion and cloudy urine.  Ordered UA/UCx.  UA returned normal.   Can we call pt and notify urine was normal, get update on sxs.  If continued sxs, will recommend f/u with PCP. UA given to Lugene to scan

## 2012-06-02 ENCOUNTER — Ambulatory Visit (INDEPENDENT_AMBULATORY_CARE_PROVIDER_SITE_OTHER): Payer: Medicare Other | Admitting: Pharmacist

## 2012-06-02 DIAGNOSIS — I639 Cerebral infarction, unspecified: Secondary | ICD-10-CM

## 2012-06-02 DIAGNOSIS — I82409 Acute embolism and thrombosis of unspecified deep veins of unspecified lower extremity: Secondary | ICD-10-CM

## 2012-06-02 DIAGNOSIS — I6350 Cerebral infarction due to unspecified occlusion or stenosis of unspecified cerebral artery: Secondary | ICD-10-CM

## 2012-06-02 NOTE — Telephone Encounter (Signed)
Patient Information:  Caller Name: [name]  Phone: [phone]  Patient: Jennifer Brennan, Jennifer Brennan  Gender: Female  DOB: 07-08-1923  Age: 78 Years  PCP: [name] Paci (Adults only)  Office Follow Up:  Does the office need to follow up with this patient?: No  Instructions For The Office: N/A  RN Note:  Unable to schedule appointment within time frame needed in office. Advised daughter to take patient to the ED at this time. She reports that the patient will not agree to go to the hospital, and the home health nurse will be there this evening. Advised daughter to call 911 and have paramedics come to the home to assess the patient as quickly as possible (atleast within the hour). Daughter agrees to call 911 to have the patient assessed in the home.  Symptoms  Reason For Call & Symptoms: Confusion, pacing excessively, aggressive behavior at times. Patient talked about harming her husband because she thinks he is having delusions/hallucinations.  Reviewed Health History In EMR: Yes  Reviewed Medications In EMR: Yes  Reviewed Allergies In EMR: Yes  Reviewed Surgeries / Procedures: Yes  Date of Onset of Symptoms: 06/01/2012  Guideline(s) Used:  Confusion - Delirium  Disposition Per Guideline:   Go to ED Now  Reason For Disposition Reached:   Bizarre or paranoid behavior  Advice Given:  N/A  RN Overrode Recommendation:  Make Appointment  Daughter wants to possibly start a dementia medication or a sedative to help with her sleeping.

## 2012-06-03 ENCOUNTER — Encounter: Payer: Self-pay | Admitting: Internal Medicine

## 2012-06-03 ENCOUNTER — Encounter (HOSPITAL_BASED_OUTPATIENT_CLINIC_OR_DEPARTMENT_OTHER): Payer: Medicare Other

## 2012-06-03 NOTE — Telephone Encounter (Signed)
Noted thanks °

## 2012-06-04 ENCOUNTER — Telehealth: Payer: Self-pay | Admitting: *Deleted

## 2012-06-04 ENCOUNTER — Ambulatory Visit: Payer: Medicare Other | Admitting: Vascular Surgery

## 2012-06-04 MED ORDER — FENTANYL 12 MCG/HR TD PT72: 1.0000 | MEDICATED_PATCH | TRANSDERMAL | Status: DC

## 2012-06-04 NOTE — Telephone Encounter (Signed)
Valentina Gu, Ok to refill. Thx! [name] Kocher

## 2012-06-04 NOTE — Telephone Encounter (Signed)
Notified daughter rx ready for pick-up.../lmb 

## 2012-06-04 NOTE — Telephone Encounter (Signed)
Left msg on vm needing refill on mom fentanyl patch. MD out of office. Is this ok?...Raechel Chute

## 2012-06-07 ENCOUNTER — Ambulatory Visit (INDEPENDENT_AMBULATORY_CARE_PROVIDER_SITE_OTHER): Payer: Medicare Other | Admitting: General Practice

## 2012-06-07 ENCOUNTER — Encounter: Payer: Self-pay | Admitting: General Practice

## 2012-06-07 DIAGNOSIS — I6350 Cerebral infarction due to unspecified occlusion or stenosis of unspecified cerebral artery: Secondary | ICD-10-CM

## 2012-06-07 DIAGNOSIS — I639 Cerebral infarction, unspecified: Secondary | ICD-10-CM

## 2012-06-07 DIAGNOSIS — I82409 Acute embolism and thrombosis of unspecified deep veins of unspecified lower extremity: Secondary | ICD-10-CM

## 2012-06-10 ENCOUNTER — Ambulatory Visit: Payer: Medicare Other | Admitting: Internal Medicine

## 2012-06-10 DIAGNOSIS — Z0289 Encounter for other administrative examinations: Secondary | ICD-10-CM

## 2012-06-11 ENCOUNTER — Encounter: Payer: Self-pay | Admitting: Family Medicine

## 2012-06-14 ENCOUNTER — Ambulatory Visit (INDEPENDENT_AMBULATORY_CARE_PROVIDER_SITE_OTHER): Payer: Medicare Other | Admitting: General Practice

## 2012-06-14 DIAGNOSIS — I639 Cerebral infarction, unspecified: Secondary | ICD-10-CM

## 2012-06-14 DIAGNOSIS — I6350 Cerebral infarction due to unspecified occlusion or stenosis of unspecified cerebral artery: Secondary | ICD-10-CM

## 2012-06-14 DIAGNOSIS — I82409 Acute embolism and thrombosis of unspecified deep veins of unspecified lower extremity: Secondary | ICD-10-CM

## 2012-06-21 ENCOUNTER — Telehealth: Payer: Self-pay | Admitting: Family Medicine

## 2012-06-21 LAB — POCT INR: INR: 2

## 2012-06-21 NOTE — Telephone Encounter (Signed)
[facility] called to report PT 26.4 and INR 2.0.  Pt can be reached at [phone].

## 2012-06-22 ENCOUNTER — Ambulatory Visit (INDEPENDENT_AMBULATORY_CARE_PROVIDER_SITE_OTHER): Payer: Medicare Other | Admitting: General Practice

## 2012-06-22 DIAGNOSIS — I82409 Acute embolism and thrombosis of unspecified deep veins of unspecified lower extremity: Secondary | ICD-10-CM

## 2012-06-22 DIAGNOSIS — I6350 Cerebral infarction due to unspecified occlusion or stenosis of unspecified cerebral artery: Secondary | ICD-10-CM

## 2012-06-22 DIAGNOSIS — I639 Cerebral infarction, unspecified: Secondary | ICD-10-CM

## 2012-06-24 ENCOUNTER — Encounter (HOSPITAL_BASED_OUTPATIENT_CLINIC_OR_DEPARTMENT_OTHER): Payer: Medicare Other | Attending: Internal Medicine

## 2012-06-24 DIAGNOSIS — X58XXXA Exposure to other specified factors, initial encounter: Secondary | ICD-10-CM | POA: Insufficient documentation

## 2012-06-24 DIAGNOSIS — S8010XA Contusion of unspecified lower leg, initial encounter: Secondary | ICD-10-CM | POA: Insufficient documentation

## 2012-06-24 DIAGNOSIS — L97809 Non-pressure chronic ulcer of other part of unspecified lower leg with unspecified severity: Secondary | ICD-10-CM | POA: Insufficient documentation

## 2012-06-24 DIAGNOSIS — I872 Venous insufficiency (chronic) (peripheral): Secondary | ICD-10-CM | POA: Insufficient documentation

## 2012-06-28 ENCOUNTER — Telehealth: Payer: Self-pay | Admitting: Internal Medicine

## 2012-06-28 MED ORDER — LEVETIRACETAM 500 MG PO TABS: 500.0000 mg | ORAL_TABLET | Freq: Two times a day (BID) | ORAL | Status: DC

## 2012-06-28 NOTE — Telephone Encounter (Signed)
Called pt spoke with husband rx sent to pharmacy...Raechel Chute

## 2012-06-28 NOTE — Telephone Encounter (Signed)
Yes - local ex done

## 2012-06-28 NOTE — Telephone Encounter (Signed)
Patient was referred to Dr. Modesto Charon and since he has left [facility] she can not get a refill on her Keppra, she is wondering if Dr. Felicity Coyer can refill this medication for her

## 2012-06-29 ENCOUNTER — Ambulatory Visit (INDEPENDENT_AMBULATORY_CARE_PROVIDER_SITE_OTHER): Payer: Medicare Other | Admitting: General Practice

## 2012-06-29 DIAGNOSIS — I82409 Acute embolism and thrombosis of unspecified deep veins of unspecified lower extremity: Secondary | ICD-10-CM

## 2012-06-29 DIAGNOSIS — I639 Cerebral infarction, unspecified: Secondary | ICD-10-CM

## 2012-06-29 DIAGNOSIS — I6350 Cerebral infarction due to unspecified occlusion or stenosis of unspecified cerebral artery: Secondary | ICD-10-CM

## 2012-06-29 LAB — POCT INR: INR: 2

## 2012-07-05 ENCOUNTER — Other Ambulatory Visit: Payer: Self-pay | Admitting: Internal Medicine

## 2012-07-05 NOTE — Telephone Encounter (Signed)
Patients care giver is requesting a refill on patients fentanyl, call her when ready for pick up and she will take to the pharmacy

## 2012-07-05 NOTE — Telephone Encounter (Signed)
ok 

## 2012-07-05 NOTE — Telephone Encounter (Signed)
Will forward to dr. Felicity Coyer, as I have never seen this patient

## 2012-07-06 ENCOUNTER — Ambulatory Visit (INDEPENDENT_AMBULATORY_CARE_PROVIDER_SITE_OTHER): Payer: Medicare Other | Admitting: General Practice

## 2012-07-06 ENCOUNTER — Telehealth: Payer: Self-pay | Admitting: General Practice

## 2012-07-06 DIAGNOSIS — I82409 Acute embolism and thrombosis of unspecified deep veins of unspecified lower extremity: Secondary | ICD-10-CM

## 2012-07-06 DIAGNOSIS — I6350 Cerebral infarction due to unspecified occlusion or stenosis of unspecified cerebral artery: Secondary | ICD-10-CM

## 2012-07-06 DIAGNOSIS — I639 Cerebral infarction, unspecified: Secondary | ICD-10-CM

## 2012-07-06 LAB — POCT INR: INR: 1.6

## 2012-07-06 MED ORDER — FENTANYL 12 MCG/HR TD PT72: 1.0000 | MEDICATED_PATCH | TRANSDERMAL | Status: DC

## 2012-07-06 NOTE — Telephone Encounter (Signed)
Spoke with patient's dtr in regard dosing instructions for coumadin.

## 2012-07-06 NOTE — Telephone Encounter (Signed)
Notified daughter rx ready for pick-up.../lmb 

## 2012-07-08 ENCOUNTER — Telehealth: Payer: Self-pay | Admitting: General Practice

## 2012-07-08 NOTE — Telephone Encounter (Signed)
Patient's dtr called to inform that pt is starting doxycycline today.

## 2012-07-12 ENCOUNTER — Telehealth: Payer: Self-pay | Admitting: General Practice

## 2012-07-12 NOTE — Telephone Encounter (Signed)
Opened in error

## 2012-07-13 ENCOUNTER — Ambulatory Visit (INDEPENDENT_AMBULATORY_CARE_PROVIDER_SITE_OTHER): Payer: Medicare Other | Admitting: General Practice

## 2012-07-13 DIAGNOSIS — I6350 Cerebral infarction due to unspecified occlusion or stenosis of unspecified cerebral artery: Secondary | ICD-10-CM

## 2012-07-13 DIAGNOSIS — I639 Cerebral infarction, unspecified: Secondary | ICD-10-CM

## 2012-07-13 DIAGNOSIS — I82409 Acute embolism and thrombosis of unspecified deep veins of unspecified lower extremity: Secondary | ICD-10-CM

## 2012-07-16 ENCOUNTER — Ambulatory Visit (INDEPENDENT_AMBULATORY_CARE_PROVIDER_SITE_OTHER): Payer: Medicare Other | Admitting: General Practice

## 2012-07-16 DIAGNOSIS — I82409 Acute embolism and thrombosis of unspecified deep veins of unspecified lower extremity: Secondary | ICD-10-CM

## 2012-07-16 DIAGNOSIS — I6350 Cerebral infarction due to unspecified occlusion or stenosis of unspecified cerebral artery: Secondary | ICD-10-CM

## 2012-07-16 DIAGNOSIS — I639 Cerebral infarction, unspecified: Secondary | ICD-10-CM

## 2012-07-23 ENCOUNTER — Inpatient Hospital Stay (HOSPITAL_COMMUNITY)
Admission: AD | Admit: 2012-07-23 | Discharge: 2012-07-23 | Disposition: A | Discharge status: Home / Self Care | Payer: Medicare Other | Source: Ambulatory Visit | Attending: Obstetrics and Gynecology | Admitting: Obstetrics and Gynecology

## 2012-07-23 ENCOUNTER — Encounter (HOSPITAL_COMMUNITY): Payer: Self-pay

## 2012-07-23 DIAGNOSIS — R350 Frequency of micturition: Secondary | ICD-10-CM | POA: Insufficient documentation

## 2012-07-23 DIAGNOSIS — N764 Abscess of vulva: Secondary | ICD-10-CM | POA: Insufficient documentation

## 2012-07-23 HISTORY — DX: Abscess of vulva: N76.4

## 2012-07-23 LAB — URINALYSIS, ROUTINE W REFLEX MICROSCOPIC
Ketones, ur: NEGATIVE mg/dL
Leukocytes, UA: NEGATIVE
Nitrite: NEGATIVE
Protein, ur: NEGATIVE mg/dL
Urobilinogen, UA: 0.2 mg/dL (ref 0.0–1.0)
pH: 6 (ref 5.0–8.0)

## 2012-07-23 NOTE — MAU Provider Note (Signed)
History     CSN: [id number]  Arrival date and time: 07/23/12 1620   First Provider Initiated Contact with Patient 07/23/12 1757      Chief Complaint  Patient presents with  . Abscess   HPI Jennifer Brennan is 79 y.o. presents with ? Allergic reaction to antibiotics that were given for vulvar abscess -culture + MRSA.  1/12 she had vaginal pain.  She was seen by her wound specialist, dx with abscess and started on Doxycycline. Culture sent at that visit (1/16)    Medication was changed on 1/20 after culture back to bactrim DS.   She saw Dr. Frederik Pear on 1/22, thought it was improving.  He questioned allergy to the medication.    At time she told the doctor lips were swelling with both medication so she added Benadryl to each dose.  She was instructed to discontinue the med.  Her daughter was concerned that her Mom needed another antibiotic.  She called Dr. Thomasene Lot office and the wound center and neither could get her in today.  She is concerned her Mother is urinating too frequently and she is not sleeping well.  Patient states the vulvar abscess is much less painful and better.   Daughter is concerned that MRSA is not completed treated. She did take 21 tabls of Bactrim DS and 8 tabs of the doxycycline.  Daughter is also concerned that she has been confused and may need further evaluation of that sxs.      Past Medical History  Diagnosis Date  . Hypertension   . Hypothyroidism   . Seizure disorder   . History of cerebrovascular accident   . Severe stage glaucoma     legally blind  . Depression with anxiety   . OAB (overactive bladder)   . ADENOCARCINOMA, LEFT BREAST 04/11/2010    s/p L mastectomy, on femara x 75yr  . BACK PAIN, LUMBAR, CHRONIC   . DVT 05/2007    RLE following R THR - chronic coumadin, chronic RLE pain  . GLAUCOMA   . OSTEOARTHRITIS, HANDS, BILATERAL   . OSTEOPENIA   . Retinal ischemia   . Sciatica of right side     chronic RLE pain, multifactorial - MRI T/L spine  02/2011  . VITAMIN D DEFICIENCY dx 08/2008  . Venous ulcer of right lower extremity with varicose veins   . Dementia   . Vulvar abscess     Past Surgical History  Procedure Date  . Total abdominal hysterectomy   . Total hip arthroplasty 10/2006    right hip  . Refractive surgery     B/L  . Breast surgery 03/2010    breast biopsy, L mastectomy   . Tonsillectomy     Family History  Problem Relation Age of Onset  . Ovarian cancer Mother   . Cancer Mother   . Deep vein thrombosis Mother   . Heart disease Mother   . Arthritis Other     grandparents  . Lung cancer Other   . Heart disease Other     parent  . Cancer Father     BRAIN TUMOR  . Cancer Brother   . Hyperlipidemia Brother   . Hypertension Brother   . Stroke Brother   . Arthritis Brother   . Diabetes Daughter   . Heart disease Daughter   . Hypertension Daughter   . Hyperlipidemia Daughter   . Other Daughter     VARICOSE VEINS    History  Substance Use Topics  .  Smoking status: Former Smoker -- 45 years    Types: Cigarettes    Quit date: 10/16/1979  . Smokeless tobacco: Never Used  . Alcohol Use: No    Allergies:  Allergies  Allergen Reactions  . Bactrim (Sulfamethoxazole W-Trimethoprim) Other (See Comments)    MD stopped due to kidney failure Also causes lips to swell  . Furosemide Other (See Comments)    MD stopped due to kidney failure  . Percocet (Oxycodone-Acetaminophen) Other (See Comments)    Causes SEIZURES  . Valsartan Other (See Comments)    MD stopped due to kidney failure  . Doxycycline     Causes lips to swell  . Penicillins Hives  . Morphine Sulfate Itching and Rash  . Sertraline Hcl Rash    Prescriptions prior to admission  Medication Sig Dispense Refill  . acetaminophen (TYLENOL) 500 MG tablet Take 1,000 mg by mouth every 6 (six) hours as needed. pain      . albuterol (PROVENTIL HFA;VENTOLIN HFA) 108 (90 BASE) MCG/ACT inhaler Inhale 2 puffs into the lungs every 6 (six) hours as  needed. For shortness of breath.      Marland Kitchen albuterol-ipratropium (COMBIVENT) 18-103 MCG/ACT inhaler Inhale 1-2 puffs into the lungs every 6 (six) hours as needed. For wheezing      . bifidobacterium infantis (ALIGN) capsule Take 1 capsule by mouth at bedtime.      . brimonidine (ALPHAGAN) 0.15 % ophthalmic solution Place 1 drop into both eyes 2 (two) times daily.      . Cholecalciferol 2000 UNITS TABS Take 1 tablet by mouth every morning.      . diphenhydrAMINE (BENADRYL) 25 MG tablet Take 12.5 mg by mouth 2 (two) times daily as needed. Pt took while on antibiotics      . docusate sodium (COLACE) 100 MG capsule Take 100 mg by mouth daily.       . dorzolamide-timolol (COSOPT) 22.3-6.8 MG/ML ophthalmic solution Place 1 drop into both eyes 2 (two) times daily.        . fentaNYL (DURAGESIC - DOSED MCG/HR) 12 MCG/HR Place 1 patch (12.5 mcg total) onto the skin every 3 (three) days.  10 patch  0  . gabapentin (NEURONTIN) 300 MG capsule Take 300 mg by mouth 2 (two) times daily.      Marland Kitchen latanoprost (XALATAN) 0.005 % ophthalmic solution Place 1 drop into both eyes at bedtime.        Marland Kitchen letrozole (FEMARA) 2.5 MG tablet Take 1 tablet (2.5 mg total) by mouth at bedtime.  90 tablet  3  . levETIRAcetam (KEPPRA) 500 MG tablet Take 1 tablet (500 mg total) by mouth 2 (two) times daily.  60 tablet  11  . levothyroxine (SYNTHROID, LEVOTHROID) 88 MCG tablet Take 88 mcg by mouth daily.      . Multiple Vitamin (MULTIVITAMIN WITH MINERALS) TABS Take 1 tablet by mouth daily.      Marland Kitchen NIFEDICAL XL 30 MG 24 hr tablet take 1 tablet by mouth once daily  30 each  5  . omeprazole (PRILOSEC) 20 MG capsule Take 20 mg by mouth daily.       . pilocarpine (PILOCAR) 1 % ophthalmic solution Place 1 drop into both eyes 3 (three) times daily.       Marland Kitchen tiotropium (SPIRIVA) 18 MCG inhalation capsule Place 18 mcg into inhaler and inhale daily.      Marland Kitchen warfarin (COUMADIN) 4 MG tablet Take 2 mg by mouth daily. On Tuesday and Friday, take  1/2  Tablet  (2mg ). On ALL OTHER DAYS, take 1 tablet (4mg ).      . Fluticasone-Salmeterol (ADVAIR) 250-50 MCG/DOSE AEPB Inhale 1 puff into the lungs 2 (two) times daily as needed. For shortness of breath        Review of Systems  Constitutional: Negative.        Not sleeping well  HENT: Negative.   Respiratory: Negative.   Cardiovascular: Negative.   Gastrointestinal: Negative.   Genitourinary: Positive for frequency.       Hx of vulvar abscess   Physical Exam   Blood pressure 148/63, pulse 60, temperature 97.7 F (36.5 C), temperature source Oral, resp. rate 18, SpO2 95.00%.  Physical Exam  Constitutional: She appears well-developed and well-nourished. No distress.  HENT:  Head: Normocephalic.  Neck: Normal range of motion.  Cardiovascular: Normal rate.   Respiratory: Effort normal.  GI: Soft. She exhibits no distension and no mass. There is no tenderness. There is no rebound and no guarding.  Genitourinary: There is no rash, tenderness, lesion or injury on the right labia. There is no rash, tenderness, lesion or injury on the left labia. No tenderness or bleeding around the vagina.       There is no residual inflammatory, tenderness where the vulvar abscess was.  Neurological: She is alert. She has normal strength. She is not disoriented. No cranial nerve deficit.       Equal grips.  There is not asymmetry of her face.  She is appropriate in her responses.  Knows where she is.  Answers questions without hesitation.  Smiling and very friendly  Skin: Skin is warm and dry.  Psychiatric: She has a normal mood and affect. Her behavior is normal. Thought content normal.   Results for orders placed during the hospital encounter of 07/23/12 (from the past 24 hour(s))  URINALYSIS, ROUTINE W REFLEX MICROSCOPIC     Status: Abnormal   Collection Time   07/23/12  5:45 PM      Component Value Range   Color, Urine STRAW (*) YELLOW   APPearance CLEAR  CLEAR   Specific Gravity, Urine <1.005 (*)  1.005 - 1.030   pH 6.0  5.0 - 8.0   Glucose, UA NEGATIVE  NEGATIVE mg/dL   Hgb urine dipstick TRACE (*) NEGATIVE   Bilirubin Urine NEGATIVE  NEGATIVE   Ketones, ur NEGATIVE  NEGATIVE mg/dL   Protein, ur NEGATIVE  NEGATIVE mg/dL   Urobilinogen, UA 0.2  0.0 - 1.0 mg/dL   Nitrite NEGATIVE  NEGATIVE   Leukocytes, UA NEGATIVE  NEGATIVE  URINE MICROSCOPIC-ADD ON     Status: Normal   Collection Time   07/23/12  5:45 PM      Component Value Range   Squamous Epithelial / LPF RARE  RARE   RBC / HPF 0-2  <3 RBC/hpf    MAU Course  Procedures  MDM Discussed at length with the patient and her daughter, the UA results and physical exam findings.   The daughter is reassured that her Mother's sleeplessness is not an UTI and any sxs that resemble an acute CVA.  She will follow up with Dr. Chilton Si  Assessment and Plan  A:  Vulvar abscess -resolved     Frequency of urination with normal UA  P:  Encouraged the daughter to call Dr. Chilton Si to discuss her concerns and further evaluation if indicated for confusion and frequency.              Xabi Wittler,EVE M 07/23/2012, 5:59 PM

## 2012-07-23 NOTE — MAU Provider Note (Signed)
Attestation of Attending Supervision of Advanced Practitioner (CNM/NP): Evaluation and management procedures were performed by the Advanced Practitioner under my supervision and collaboration.  I have reviewed the Advanced Practitioner's note and chart, and I agree with the management and plan.  Daniil Labarge 07/23/2012 9:15 PM

## 2012-07-23 NOTE — MAU Note (Signed)
Patient states she has been diagnosed with a vulvar abscess with MRSA. Has had symptoms of an abscess since 1-12. Has been treated with Doxy and Sulfa and has had swelling of the lips to both medications. Patients daughter states she had to give Benadryl with each dose and her primary care provider states for her to stop taking.

## 2012-07-29 ENCOUNTER — Encounter (HOSPITAL_BASED_OUTPATIENT_CLINIC_OR_DEPARTMENT_OTHER): Payer: Medicare Other

## 2012-07-29 ENCOUNTER — Encounter (HOSPITAL_BASED_OUTPATIENT_CLINIC_OR_DEPARTMENT_OTHER): Payer: Medicare Other | Attending: Internal Medicine

## 2012-07-29 DIAGNOSIS — I872 Venous insufficiency (chronic) (peripheral): Secondary | ICD-10-CM | POA: Insufficient documentation

## 2012-07-29 DIAGNOSIS — S8010XA Contusion of unspecified lower leg, initial encounter: Secondary | ICD-10-CM | POA: Insufficient documentation

## 2012-07-29 DIAGNOSIS — I83009 Varicose veins of unspecified lower extremity with ulcer of unspecified site: Secondary | ICD-10-CM | POA: Insufficient documentation

## 2012-07-29 DIAGNOSIS — X58XXXA Exposure to other specified factors, initial encounter: Secondary | ICD-10-CM | POA: Insufficient documentation

## 2012-07-29 DIAGNOSIS — L97909 Non-pressure chronic ulcer of unspecified part of unspecified lower leg with unspecified severity: Secondary | ICD-10-CM | POA: Insufficient documentation

## 2012-08-02 ENCOUNTER — Ambulatory Visit: Payer: Medicare Other | Admitting: Internal Medicine

## 2012-08-02 ENCOUNTER — Other Ambulatory Visit: Payer: Self-pay | Admitting: Internal Medicine

## 2012-08-02 DIAGNOSIS — Z0289 Encounter for other administrative examinations: Secondary | ICD-10-CM

## 2012-08-26 ENCOUNTER — Encounter (HOSPITAL_BASED_OUTPATIENT_CLINIC_OR_DEPARTMENT_OTHER): Payer: Medicare Other | Attending: Internal Medicine

## 2012-08-26 DIAGNOSIS — L97809 Non-pressure chronic ulcer of other part of unspecified lower leg with unspecified severity: Secondary | ICD-10-CM | POA: Insufficient documentation

## 2012-08-26 DIAGNOSIS — I872 Venous insufficiency (chronic) (peripheral): Secondary | ICD-10-CM | POA: Insufficient documentation

## 2012-08-26 DIAGNOSIS — I8390 Asymptomatic varicose veins of unspecified lower extremity: Secondary | ICD-10-CM | POA: Insufficient documentation

## 2012-09-13 ENCOUNTER — Ambulatory Visit (INDEPENDENT_AMBULATORY_CARE_PROVIDER_SITE_OTHER): Payer: Medicare Other | Admitting: Pharmacotherapy

## 2012-09-13 VITALS — BP 136/72 | HR 56 | Temp 96.3°F | Resp 18 | Ht <= 58 in | Wt 162.0 lb

## 2012-09-13 DIAGNOSIS — Z7901 Long term (current) use of anticoagulants: Secondary | ICD-10-CM

## 2012-09-13 MED ORDER — WARFARIN SODIUM 5 MG PO TABS: 5.0000 mg | ORAL_TABLET | Freq: Every day | ORAL | Status: DC

## 2012-09-13 MED ORDER — FENTANYL 12 MCG/HR TD PT72: MEDICATED_PATCH | TRANSDERMAL | Status: DC

## 2012-09-19 NOTE — Progress Notes (Signed)
Subjective:     Patient ID: Jennifer Brennan, female   DOB: September 03, 1923, 79 y.o.   MRN: [id number]  HPI Last INR on 07/13/12 was OK at 2.1 Current Coumadin dose is 4mg  QD except 2mg  T/F Denies missed doses Consistent with vitamin K No falls Has started Myrbetriq   Review of Systems  HENT: Negative for nosebleeds.   Respiratory: Negative for chest tightness and shortness of breath.   Cardiovascular: Negative for chest pain.  Gastrointestinal: Negative for blood in stool and anal bleeding.  Genitourinary: Negative for hematuria.       Objective:   Physical Exam  Constitutional: She appears well-developed and well-nourished.  Eyes:  Patient is blind  Cardiovascular: Normal rate and regular rhythm.   Pulmonary/Chest: Effort normal and breath sounds normal.  Musculoskeletal: She exhibits edema.  Neurological: She is alert.  Skin: Skin is warm and dry.       Assessment:     INR 1.1     Plan:     1.  Increase Coumadin 5mg  QD 2.  Repeat INR in 2 weeks 3.  DVT - stable 4.  CVA stable

## 2012-09-21 ENCOUNTER — Ambulatory Visit: Payer: Self-pay | Admitting: Podiatry

## 2012-09-23 ENCOUNTER — Encounter (HOSPITAL_BASED_OUTPATIENT_CLINIC_OR_DEPARTMENT_OTHER): Payer: Medicare Other | Attending: Internal Medicine

## 2012-09-23 DIAGNOSIS — L97909 Non-pressure chronic ulcer of unspecified part of unspecified lower leg with unspecified severity: Secondary | ICD-10-CM | POA: Insufficient documentation

## 2012-09-23 DIAGNOSIS — I87319 Chronic venous hypertension (idiopathic) with ulcer of unspecified lower extremity: Secondary | ICD-10-CM | POA: Insufficient documentation

## 2012-09-24 IMAGING — US US EXTREM LOW*R* LIMITED
1 series · 4 of 4 positions shown · non-contrast
Comparison: None

CLINICAL DATA: Cellulitis and skin induration on the right lower
leg.

ULTRASOUND RIGHT LOWER EXTREMITY LIMITED
TECHNIQUE: Ultrasound examination of the region of interest in the
right lower extremity was performed.

[Series 1: us extrem low*right* limited · 4 of 4 slices shown]
[im 1/4]
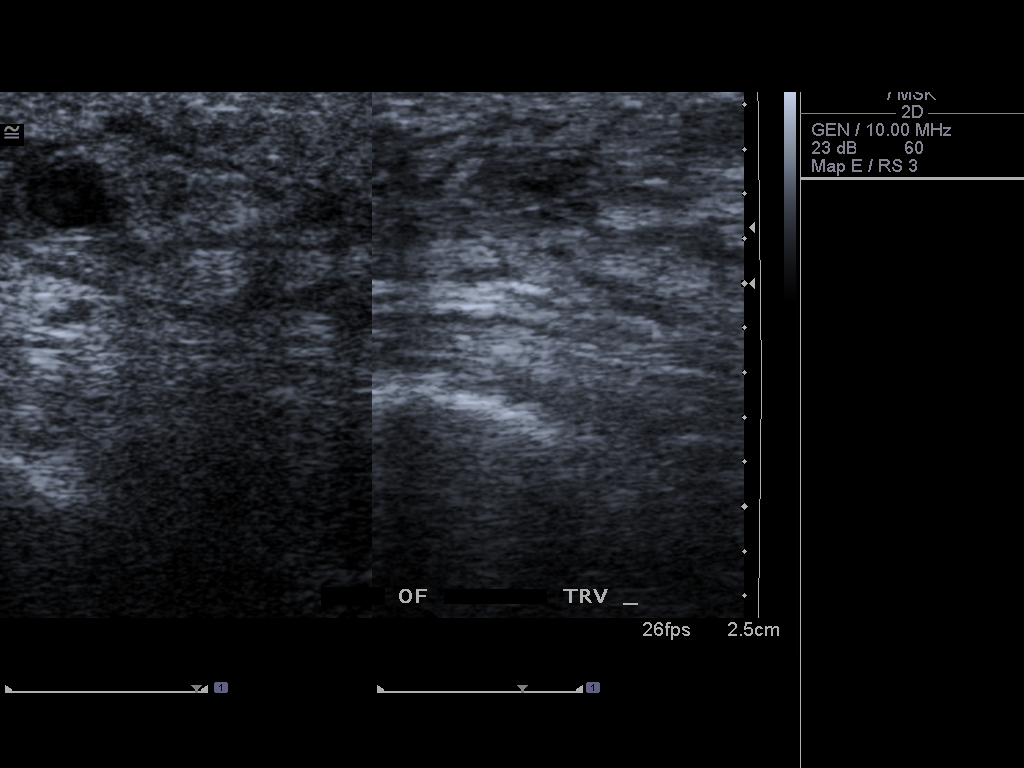
[im 2/4]
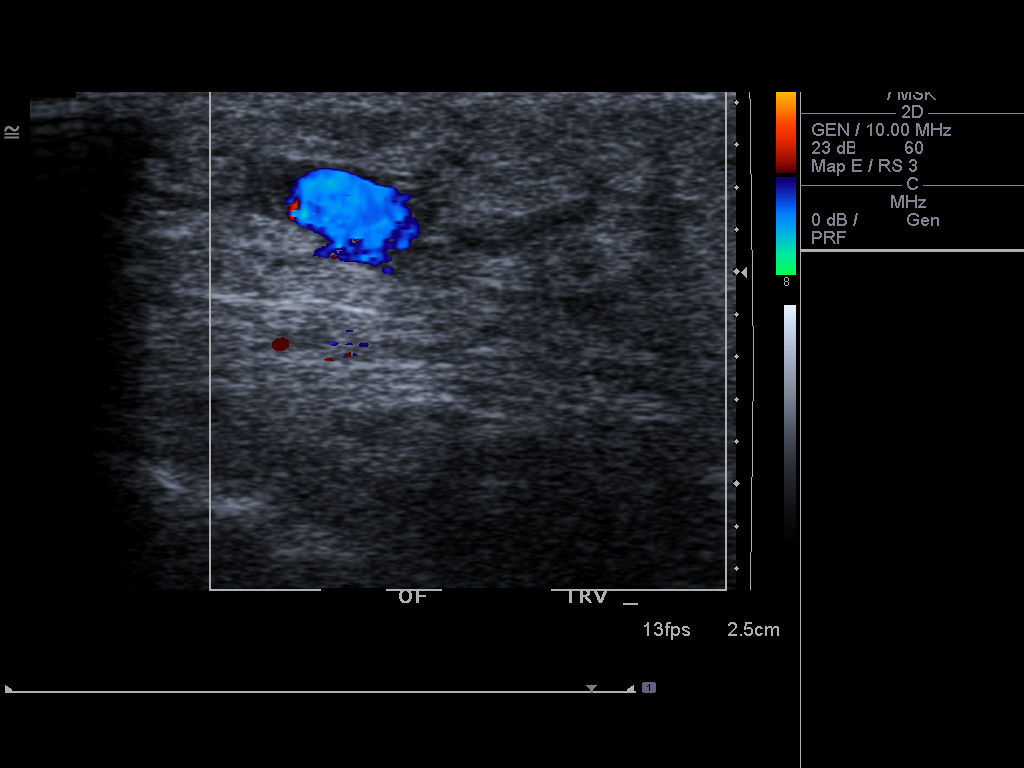
[im 3/4]
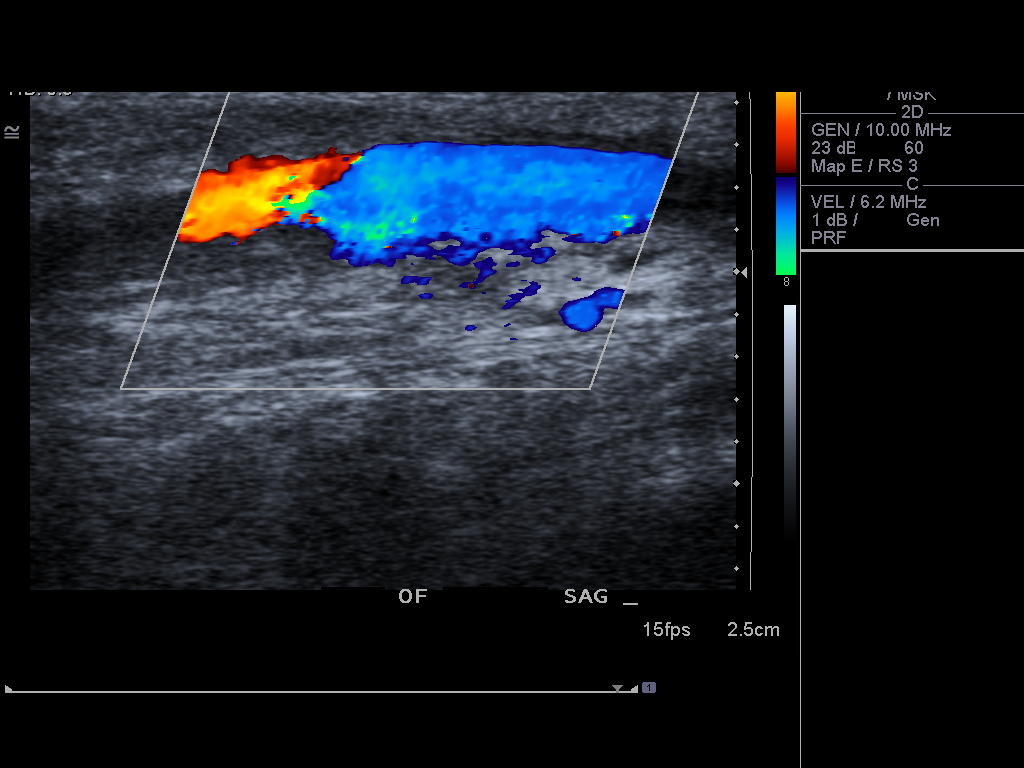
[im 4/4]
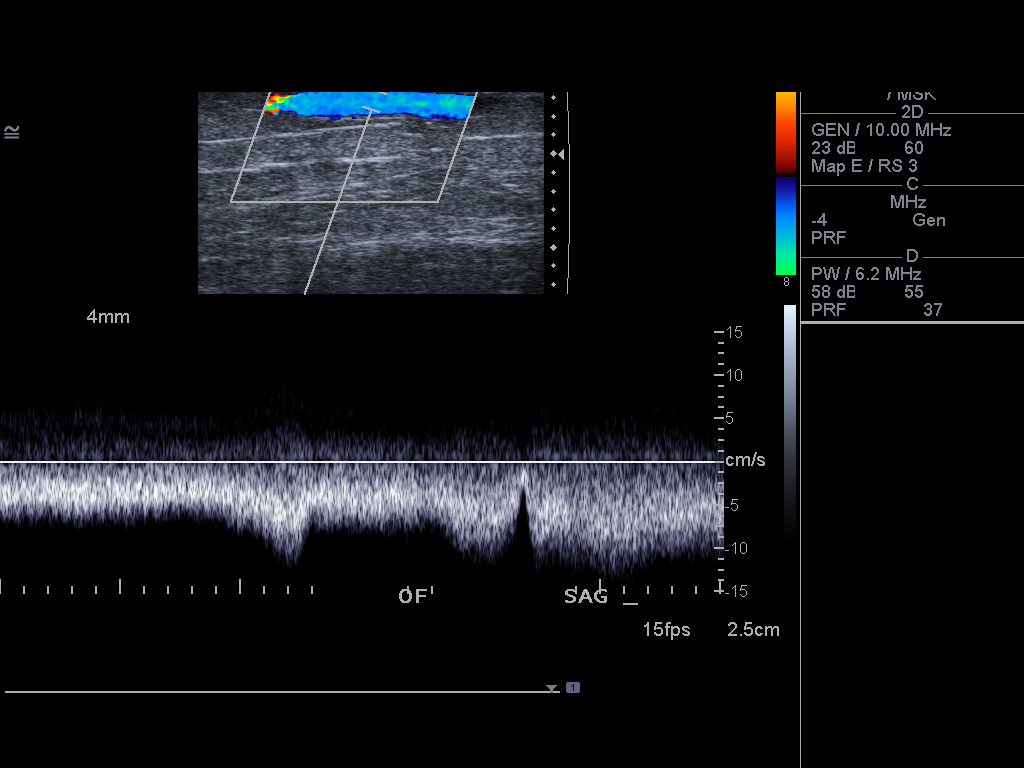

[4 of 4 positions shown; findings below may reference images not displayed]

FINDINGS: Real time ultrasound demonstrates no evidence of an
abscess, mass, or other discrete abnormality.  There is slight
edema in the subcutaneous fat.
IMPRESSION: No focal abscess or other discrete abnormality.  Slight edema in
the subcutaneous fat.

## 2012-09-25 ENCOUNTER — Emergency Department (HOSPITAL_COMMUNITY)
Admission: EM | Admit: 2012-09-25 | Discharge: 2012-09-25 | Disposition: A | Discharge status: Home / Self Care | Payer: Medicare Other | Attending: Emergency Medicine | Admitting: Emergency Medicine

## 2012-09-25 ENCOUNTER — Encounter (HOSPITAL_COMMUNITY): Payer: Self-pay | Admitting: Physical Medicine and Rehabilitation

## 2012-09-25 ENCOUNTER — Emergency Department (HOSPITAL_COMMUNITY): Payer: Medicare Other

## 2012-09-25 DIAGNOSIS — E039 Hypothyroidism, unspecified: Secondary | ICD-10-CM | POA: Insufficient documentation

## 2012-09-25 DIAGNOSIS — Z8673 Personal history of transient ischemic attack (TIA), and cerebral infarction without residual deficits: Secondary | ICD-10-CM | POA: Insufficient documentation

## 2012-09-25 DIAGNOSIS — F341 Dysthymic disorder: Secondary | ICD-10-CM | POA: Insufficient documentation

## 2012-09-25 DIAGNOSIS — Z87891 Personal history of nicotine dependence: Secondary | ICD-10-CM | POA: Insufficient documentation

## 2012-09-25 DIAGNOSIS — Z8739 Personal history of other diseases of the musculoskeletal system and connective tissue: Secondary | ICD-10-CM | POA: Insufficient documentation

## 2012-09-25 DIAGNOSIS — Z853 Personal history of malignant neoplasm of breast: Secondary | ICD-10-CM | POA: Insufficient documentation

## 2012-09-25 DIAGNOSIS — Z8639 Personal history of other endocrine, nutritional and metabolic disease: Secondary | ICD-10-CM | POA: Insufficient documentation

## 2012-09-25 DIAGNOSIS — M899 Disorder of bone, unspecified: Secondary | ICD-10-CM | POA: Insufficient documentation

## 2012-09-25 DIAGNOSIS — M949 Disorder of cartilage, unspecified: Secondary | ICD-10-CM | POA: Insufficient documentation

## 2012-09-25 DIAGNOSIS — I872 Venous insufficiency (chronic) (peripheral): Secondary | ICD-10-CM | POA: Insufficient documentation

## 2012-09-25 DIAGNOSIS — IMO0002 Reserved for concepts with insufficient information to code with codable children: Secondary | ICD-10-CM | POA: Insufficient documentation

## 2012-09-25 DIAGNOSIS — M545 Low back pain, unspecified: Secondary | ICD-10-CM | POA: Insufficient documentation

## 2012-09-25 DIAGNOSIS — Z79899 Other long term (current) drug therapy: Secondary | ICD-10-CM | POA: Insufficient documentation

## 2012-09-25 DIAGNOSIS — M19049 Primary osteoarthritis, unspecified hand: Secondary | ICD-10-CM | POA: Insufficient documentation

## 2012-09-25 DIAGNOSIS — T364X5A Adverse effect of tetracyclines, initial encounter: Secondary | ICD-10-CM | POA: Insufficient documentation

## 2012-09-25 DIAGNOSIS — I10 Essential (primary) hypertension: Secondary | ICD-10-CM | POA: Insufficient documentation

## 2012-09-25 DIAGNOSIS — H548 Legal blindness, as defined in USA: Secondary | ICD-10-CM | POA: Insufficient documentation

## 2012-09-25 DIAGNOSIS — G8929 Other chronic pain: Secondary | ICD-10-CM | POA: Insufficient documentation

## 2012-09-25 DIAGNOSIS — IMO0001 Reserved for inherently not codable concepts without codable children: Secondary | ICD-10-CM

## 2012-09-25 DIAGNOSIS — R791 Abnormal coagulation profile: Secondary | ICD-10-CM | POA: Insufficient documentation

## 2012-09-25 DIAGNOSIS — Z87448 Personal history of other diseases of urinary system: Secondary | ICD-10-CM | POA: Insufficient documentation

## 2012-09-25 DIAGNOSIS — L97909 Non-pressure chronic ulcer of unspecified part of unspecified lower leg with unspecified severity: Secondary | ICD-10-CM | POA: Insufficient documentation

## 2012-09-25 DIAGNOSIS — Z862 Personal history of diseases of the blood and blood-forming organs and certain disorders involving the immune mechanism: Secondary | ICD-10-CM | POA: Insufficient documentation

## 2012-09-25 DIAGNOSIS — Z86718 Personal history of other venous thrombosis and embolism: Secondary | ICD-10-CM | POA: Insufficient documentation

## 2012-09-25 DIAGNOSIS — G40909 Epilepsy, unspecified, not intractable, without status epilepticus: Secondary | ICD-10-CM | POA: Insufficient documentation

## 2012-09-25 DIAGNOSIS — H409 Unspecified glaucoma: Secondary | ICD-10-CM | POA: Insufficient documentation

## 2012-09-25 DIAGNOSIS — F0390 Unspecified dementia without behavioral disturbance: Secondary | ICD-10-CM | POA: Insufficient documentation

## 2012-09-25 DIAGNOSIS — Z8742 Personal history of other diseases of the female genital tract: Secondary | ICD-10-CM | POA: Insufficient documentation

## 2012-09-25 LAB — CBC WITH DIFFERENTIAL/PLATELET
Basophils Absolute: 0.1 10*3/uL (ref 0.0–0.1)
Basophils Relative: 1 % (ref 0–1)
Eosinophils Relative: 3 % (ref 0–5)
HCT: 41.6 % (ref 36.0–46.0)
MCHC: 34.6 g/dL (ref 30.0–36.0)
MCV: 73.8 fL — ABNORMAL LOW (ref 78.0–100.0)
Monocytes Absolute: 0.7 10*3/uL (ref 0.1–1.0)
Monocytes Relative: 5 % (ref 3–12)
RDW: 17 % — ABNORMAL HIGH (ref 11.5–15.5)

## 2012-09-25 LAB — BASIC METABOLIC PANEL
BUN: 12 mg/dL (ref 6–23)
CO2: 26 mEq/L (ref 19–32)
Calcium: 9.2 mg/dL (ref 8.4–10.5)
Creatinine, Ser: 1.22 mg/dL — ABNORMAL HIGH (ref 0.50–1.10)
Glucose, Bld: 81 mg/dL (ref 70–99)

## 2012-09-25 MED ORDER — CLINDAMYCIN PHOSPHATE 300 MG/50ML IV SOLN
300.0000 mg | Freq: Once | INTRAVENOUS | Status: AC
  Administered 2012-09-25: 300 mg via INTRAVENOUS
  Filled 2012-09-25: qty 50

## 2012-09-25 MED ORDER — CLINDAMYCIN HCL 300 MG PO CAPS: 300.0000 mg | ORAL_CAPSULE | Freq: Four times a day (QID) | ORAL | Status: DC

## 2012-09-25 NOTE — ED Notes (Signed)
Pt presents to department from home for evaluation of possible allergic reaction. Caregiver states she began taking doxycycline 100mg  BID on 09/23/12 for L lower leg wound. Caregiver reports she began wheezing, having chills and became weak today. She is alert and can answer simple questions. Respirations unlabored.

## 2012-09-25 NOTE — ED Notes (Signed)
Sterile dressing applied to L lower leg wounds. Pt tolerated without difficulty. Distal circulation intact.

## 2012-09-25 NOTE — ED Provider Notes (Signed)
History     CSN: [id number]  Arrival date & time 09/25/12  1232   First MD Initiated Contact with Patient 09/25/12 1339      Chief Complaint  Patient presents with  . Allergic Reaction  . Leg Pain    (Consider location/radiation/quality/duration/timing/severity/associated sxs/prior treatment) Patient is a 79 y.o. female presenting with allergic reaction and leg pain. The history is provided by the patient and a relative.  Allergic Reaction  Leg Pain She has been treated in the wound Center for a sore on her left leg which had been doing fairly well but got worse this past week. 2 days ago, she was seen at the wound center were made to put some cream on it and give her prescription for doxycycline. She says that she has felt sick since getting the doxycycline but cannot describe what the sick feeling was. She thinks she might have run a fever this morning but is not sure how high. She denies chills or sweats. She denies nausea but is feeling generally weak. She states that she was sent here because she was having a problem with the antibiotic and it needs to be changed. However, repeated questioning he had raising the question about multiple different ways fails to elicit the exact nature of what problem she has had since starting the antibiotic. Family member is here who will only is able to tell me that she started the antibiotic 2 days ago and has been sick since then and the antibiotic needs to be changed.  Past Medical History  Diagnosis Date  . Hypertension   . Hypothyroidism   . Seizure disorder   . History of cerebrovascular accident   . Severe stage glaucoma     legally blind  . Depression with anxiety   . OAB (overactive bladder)   . ADENOCARCINOMA, LEFT BREAST 04/11/2010    s/p L mastectomy, on femara x 80yr  . BACK PAIN, LUMBAR, CHRONIC   . DVT 05/2007    RLE following R THR - chronic coumadin, chronic RLE pain  . GLAUCOMA   . OSTEOARTHRITIS, HANDS, BILATERAL   .  OSTEOPENIA   . Retinal ischemia   . Sciatica of right side     chronic RLE pain, multifactorial - MRI T/L spine 02/2011  . VITAMIN D DEFICIENCY dx 08/2008  . Venous ulcer of right lower extremity with varicose veins   . Dementia   . Vulvar abscess     Past Surgical History  Procedure Laterality Date  . Total abdominal hysterectomy    . Total hip arthroplasty  10/2006    right hip  . Refractive surgery      B/L  . Breast surgery  03/2010    breast biopsy, L mastectomy   . Tonsillectomy      Family History  Problem Relation Age of Onset  . Ovarian cancer Mother   . Cancer Mother   . Deep vein thrombosis Mother   . Heart disease Mother   . Arthritis Other     grandparents  . Lung cancer Other   . Heart disease Other     parent  . Cancer Father     BRAIN TUMOR  . Cancer Brother   . Hyperlipidemia Brother   . Hypertension Brother   . Stroke Brother   . Arthritis Brother   . Diabetes Daughter   . Heart disease Daughter   . Hypertension Daughter   . Hyperlipidemia Daughter   . Other Daughter  VARICOSE VEINS    History  Substance Use Topics  . Smoking status: Former Smoker -- 45 years    Types: Cigarettes    Quit date: 10/16/1979  . Smokeless tobacco: Never Used  . Alcohol Use: No    OB History   Grav Para Term Preterm Abortions TAB SAB Ect Mult Living                  Review of Systems  All other systems reviewed and are negative.    Allergies  Bactrim; Furosemide; Percocet; Valsartan; Doxycycline; Penicillins; Morphine sulfate; and Sertraline hcl  Home Medications   Current Outpatient Rx  Name  Route  Sig  Dispense  Refill  . acetaminophen (TYLENOL) 500 MG tablet   Oral   Take 1,000 mg by mouth every 6 (six) hours as needed. pain         . albuterol (PROVENTIL HFA;VENTOLIN HFA) 108 (90 BASE) MCG/ACT inhaler   Inhalation   Inhale 2 puffs into the lungs every 6 (six) hours as needed. For shortness of breath.         Marland Kitchen  albuterol-ipratropium (COMBIVENT) 18-103 MCG/ACT inhaler   Inhalation   Inhale 1-2 puffs into the lungs every 6 (six) hours as needed. For wheezing         . bifidobacterium infantis (ALIGN) capsule   Oral   Take 1 capsule by mouth at bedtime.         . brimonidine (ALPHAGAN) 0.15 % ophthalmic solution   Both Eyes   Place 1 drop into both eyes 2 (two) times daily.         . Cholecalciferol 2000 UNITS TABS   Oral   Take 1 tablet by mouth every morning.         . diphenhydrAMINE (BENADRYL) 25 MG tablet   Oral   Take 12.5 mg by mouth 2 (two) times daily as needed. Pt took while on antibiotics         . docusate sodium (COLACE) 100 MG capsule   Oral   Take 100 mg by mouth daily. Take one tablet twice a day as needed for  Constipation         . dorzolamide-timolol (COSOPT) 22.3-6.8 MG/ML ophthalmic solution   Both Eyes   Place 1 drop into both eyes 2 (two) times daily.           . fentaNYL (DURAGESIC - DOSED MCG/HR) 12 MCG/HR      Apply one patch every 72 hours for scoliosis  pain. *Rotate site*   10 patch   0   . Fluticasone-Salmeterol (ADVAIR) 250-50 MCG/DOSE AEPB   Inhalation   Inhale 1 puff into the lungs 2 (two) times daily as needed. For shortness of breath         . gabapentin (NEURONTIN) 300 MG capsule   Oral   Take 300 mg by mouth 2 (two) times daily.         Marland Kitchen latanoprost (XALATAN) 0.005 % ophthalmic solution   Both Eyes   Place 1 drop into both eyes at bedtime.           Marland Kitchen letrozole (FEMARA) 2.5 MG tablet   Oral   Take 1 tablet (2.5 mg total) by mouth at bedtime.   90 tablet   3   . levETIRAcetam (KEPPRA) 500 MG tablet   Oral   Take 1 tablet (500 mg total) by mouth 2 (two) times daily.   60 tablet   11   .  levothyroxine (SYNTHROID, LEVOTHROID) 88 MCG tablet   Oral   Take 88 mcg by mouth daily.         . Multiple Vitamins-Minerals (MULTIVITAMIN GUMMIES ADULT) CHEW   Oral   Chew by mouth. Chew two gummi es a day         .  NIFEDICAL XL 30 MG 24 hr tablet      take 1 tablet by mouth once daily   30 each   5   . omeprazole (PRILOSEC) 20 MG capsule   Oral   Take 20 mg by mouth daily.          . pilocarpine (PILOCAR) 1 % ophthalmic solution   Both Eyes   Place 1 drop into both eyes 3 (three) times daily.          Marland Kitchen tiotropium (SPIRIVA) 18 MCG inhalation capsule   Inhalation   Place 18 mcg into inhaler and inhale daily.         Marland Kitchen warfarin (COUMADIN) 5 MG tablet   Oral   Take 1 tablet (5 mg total) by mouth daily.   30 tablet   3     BP 180/68  Pulse 53  Temp(Src) 97.6 F (36.4 C) (Oral)  Resp 18  SpO2 90%  Physical Exam  Nursing note and vitals reviewed.  79 year old female, resting comfortably and in no acute distress. She appears chronically ill. Vital signs are significant for bradycardia with heart rate of 53, and hypertension with blood pressure 180/63. Oxygen saturation is 90%, which is hypoxic. Head is normocephalic and atraumatic. PERRLA, EOMI. Oropharynx is clear. Neck is nontender and supple without adenopathy or JVD. Back is nontender and there is no CVA tenderness. Lungs are clear without rales, wheezes, or rhonchi. Chest is nontender. Heart has regular rate and rhythm without murmur. Abdomen is soft, flat, nontender without masses or hepatosplenomegaly and peristalsis is normoactive. Extremities have trace edema. Antibiotic cream is present over the left leg but there does appear to be underlying erythema. There are severe venous stasis changes. The right leg has mild to moderate erythema with some warmth but no drainage. Capillary refill is prompt. There are no lymphangitic streaks seen. Skin is warm and dry without other rash. Neurologic: Mental status is normal, cranial nerves are intact, there are no motor or sensory deficits.  ED Course  Procedures (including critical care time)  Results for orders placed during the hospital encounter of 09/25/12  BASIC METABOLIC  PANEL      Result Value Range   Sodium 140  135 - 145 mEq/L   Potassium 4.9  3.5 - 5.1 mEq/L   Chloride 102  96 - 112 mEq/L   CO2 26  19 - 32 mEq/L   Glucose, Bld 81  70 - 99 mg/dL   BUN 12  6 - 23 mg/dL   Creatinine, Ser 4.09 (*) 0.50 - 1.10 mg/dL   Calcium 9.2  8.4 - 81.1 mg/dL   GFR calc non Af Amer 39 (*) >90 mL/min   GFR calc Af Amer 45 (*) >90 mL/min  SEDIMENTATION RATE      Result Value Range   Sed Rate 23 (*) 0 - 22 mm/hr  CBC WITH DIFFERENTIAL      Result Value Range   WBC 6.7  4.0 - 10.5 K/uL   RBC 5.64 (*) 3.87 - 5.11 MIL/uL   Hemoglobin 14.4  12.0 - 15.0 g/dL   HCT 91.4  78.2 - 95.6 %  MCV 73.8 (*) 78.0 - 100.0 fL   MCH 25.5 (*) 26.0 - 34.0 pg   MCHC 34.6  30.0 - 36.0 g/dL   RDW 96.2 (*) 95.2 - 84.1 %   Platelets 171  150 - 400 K/uL   Neutrophils Relative 39 (*) 43 - 77 %   Neutro Abs 2.6  1.7 - 7.7 K/uL   Lymphocytes Relative 52 (*) 12 - 46 %   Lymphs Abs 3.5  0.7 - 4.0 K/uL   Monocytes Relative 5  3 - 12 %   Monocytes Absolute 0.7  0.1 - 1.0 K/uL   Eosinophils Relative 3  0 - 5 %   Eosinophils Absolute 0.2  0.0 - 0.7 K/uL   Basophils Relative 1  0 - 1 %   Basophils Absolute 0.1  0.0 - 0.1 K/uL  PROTIME-INR      Result Value Range   Prothrombin Time 17.0 (*) 11.6 - 15.2 seconds   INR 1.42  0.00 - 1.49   Dg Chest Portable 1 View  09/25/2012  *RADIOLOGY REPORT*  Clinical Data: Allergic reaction.  Hypertensive.  PORTABLE CHEST - 1 VIEW  Comparison: 03/02/2012.  Findings: Elevated right hemidiaphragm unchanged.  Calcified tortuous aorta.  Heart size within normal limits.  Mild central pulmonary vascular prominence.  No infiltrate, congestive heart failure or pneumothorax.  IMPRESSION: No acute abnormality.  Please see above.   Original Report Authenticated By: Lacy Duverney, M.D.       1. Venous stasis ulcers, left   2. Subtherapeutic international normalized ratio (INR)       MDM  Probable cellulitis related to venous stasis. Culture was obtained as an  outpatient but is not of the laboratory that I can access and results likely would not be back until tomorrow. Basic laboratory workup will be done. A CBC and sedimentation rate are unremarkable, she will be switched to clindamycin. She is on warfarin, so INR will be checked.  Patient's daughter has arrived and states that she had noted some puffiness of her eyes and lip and that was what triggered the concern for an allergic reaction. She is also well weaker than normal. Oxygen saturations were reported to be low. Initial O2 saturation here was 90 and in the room, it is still 89% so a chest x-ray will be obtained.  Chest x-ray is unremarkable. Oxygen saturation is up to 92-93% on room air. Workup is negative for significant infection including a normal sedimentation rate. She's given a dose of clindamycin in the ED and will be sent home with prescription for clindamycin and instructions to stop taking doxycycline. Also, INR is for a low of 1.41. I discussed with patient and her family possibility of being switched to rivaroxoban or similar a second generation anticoagulant since it would not have problems with drug interactions and obtaining therapeutic levels.   Dione Booze, MD 09/25/12 1630

## 2012-09-25 NOTE — ED Notes (Signed)
Pt used bedpan without difficulty. X-ray at bedside.

## 2012-09-27 ENCOUNTER — Ambulatory Visit (INDEPENDENT_AMBULATORY_CARE_PROVIDER_SITE_OTHER): Payer: Medicare Other | Admitting: Pharmacotherapy

## 2012-09-27 ENCOUNTER — Encounter: Payer: Self-pay | Admitting: Pharmacotherapy

## 2012-09-27 ENCOUNTER — Ambulatory Visit: Payer: Medicare Other | Admitting: Pharmacotherapy

## 2012-09-27 VITALS — BP 122/76 | HR 62 | Temp 97.6°F | Resp 15

## 2012-09-27 DIAGNOSIS — I82409 Acute embolism and thrombosis of unspecified deep veins of unspecified lower extremity: Secondary | ICD-10-CM

## 2012-09-27 DIAGNOSIS — Z7901 Long term (current) use of anticoagulants: Secondary | ICD-10-CM

## 2012-09-27 LAB — POCT INR: INR: 1.8

## 2012-09-27 MED ORDER — WARFARIN SODIUM 5 MG PO TABS: ORAL_TABLET | ORAL | Status: DC

## 2012-09-27 NOTE — Progress Notes (Signed)
Subjective:     Patient ID: Jennifer Brennan, female   DOB: June 18, 1924, 79 y.o.   MRN: [id number]  HPI Dr. Leanord Hawking put her on Doxycycline.  She had an allergic reaction. Her INR in the ER was 1.42. The Doxy was stopped and Clindamycin was started.  Current Coumadin dose is 5mg  QD Her dose was increased at last OV after INR was 1.1  Denies missed doses No unusual bleeding or bruising No CP, SOB, falls Consistent with vitamin K   Review of Systems  HENT: Negative for nosebleeds.   Respiratory: Negative for chest tightness and shortness of breath.   Cardiovascular: Negative for chest pain and palpitations.  Gastrointestinal: Negative for blood in stool and anal bleeding.  Genitourinary: Negative for hematuria.       Objective:   Physical Exam  Constitutional: She appears well-developed and well-nourished.  Cardiovascular: Normal rate, regular rhythm and normal heart sounds.   Pulmonary/Chest: Effort normal and breath sounds normal.  Neurological: She is alert.  Skin: Skin is warm and dry.  Psychiatric: She has a normal mood and affect.       Assessment:     INR 1.8     Plan:     Suspect Clindamycin is helping INR to rise Increase Coumadin 5mg  QD except 7.5mg  Mondays & Fridays Repeat INR in 2 weeks

## 2012-10-08 ENCOUNTER — Encounter: Payer: Self-pay | Admitting: *Deleted

## 2012-10-11 ENCOUNTER — Other Ambulatory Visit: Payer: Self-pay | Admitting: *Deleted

## 2012-10-11 ENCOUNTER — Encounter: Payer: Self-pay | Admitting: Pharmacotherapy

## 2012-10-11 ENCOUNTER — Other Ambulatory Visit: Payer: Self-pay

## 2012-10-11 ENCOUNTER — Ambulatory Visit (INDEPENDENT_AMBULATORY_CARE_PROVIDER_SITE_OTHER): Payer: Medicare Other | Admitting: Pharmacotherapy

## 2012-10-11 VITALS — BP 122/76 | HR 62 | Temp 96.7°F | Resp 16 | Wt 165.4 lb

## 2012-10-11 DIAGNOSIS — Z7901 Long term (current) use of anticoagulants: Secondary | ICD-10-CM

## 2012-10-11 DIAGNOSIS — I82409 Acute embolism and thrombosis of unspecified deep veins of unspecified lower extremity: Secondary | ICD-10-CM

## 2012-10-11 DIAGNOSIS — R32 Unspecified urinary incontinence: Secondary | ICD-10-CM

## 2012-10-11 DIAGNOSIS — I6350 Cerebral infarction due to unspecified occlusion or stenosis of unspecified cerebral artery: Secondary | ICD-10-CM

## 2012-10-11 LAB — POCT INR: INR: 1.8

## 2012-10-11 MED ORDER — TIOTROPIUM BROMIDE MONOHYDRATE 18 MCG IN CAPS: 18.0000 ug | ORAL_CAPSULE | Freq: Every day | RESPIRATORY_TRACT | Status: DC

## 2012-10-11 MED ORDER — WARFARIN SODIUM 5 MG PO TABS: ORAL_TABLET | ORAL | Status: DC

## 2012-10-11 NOTE — Progress Notes (Signed)
Subjective:     Patient ID: Jennifer Brennan, female   DOB: 1923-11-15, 79 y.o.   MRN: [id number]  HPI Last INR was low at 1.8 Coumadin was increased to 5mg  QD except 7.5mg  on Mondays and Fridays Goal INR 2-3 No missed doses No unusual bleeding or bruising No CP, falls Does have wheezing. Consistent with vitamin K. Finished all ABT on 10/03/12   Review of Systems  HENT: Negative for nosebleeds.   Respiratory: Positive for wheezing. Negative for chest tightness and shortness of breath.   Cardiovascular: Negative for chest pain and palpitations.  Gastrointestinal: Negative for blood in stool and anal bleeding.  Genitourinary: Negative for hematuria.       Objective:   Physical Exam  Constitutional: She appears well-developed and well-nourished.  Eyes: Pupils are equal, round, and reactive to light.  Neck: Normal range of motion.  Cardiovascular: Normal rate and regular rhythm.   Pulmonary/Chest: Effort normal. She has wheezes.  Musculoskeletal: She exhibits edema.  Neurological: She is alert.  Psychiatric: She has a normal mood and affect.    BP:  122/76      HR:  62      Wt:  165 lb    Assessment:     INR 1.8     Plan:     1.  INR below goal 2-3 2.  Increase Coumadin 5mg  QD except 7.5mg  on MWF 3.  DVT - stable 4.  RTC in 2 weeks

## 2012-10-13 ENCOUNTER — Ambulatory Visit (INDEPENDENT_AMBULATORY_CARE_PROVIDER_SITE_OTHER): Payer: Medicare Other | Admitting: Internal Medicine

## 2012-10-13 ENCOUNTER — Encounter: Payer: Self-pay | Admitting: Internal Medicine

## 2012-10-13 VITALS — BP 122/74 | HR 60 | Temp 95.0°F | Resp 16 | Wt 165.4 lb

## 2012-10-13 DIAGNOSIS — I739 Peripheral vascular disease, unspecified: Secondary | ICD-10-CM

## 2012-10-13 DIAGNOSIS — R531 Weakness: Secondary | ICD-10-CM

## 2012-10-13 DIAGNOSIS — R5383 Other fatigue: Secondary | ICD-10-CM

## 2012-10-13 DIAGNOSIS — I87009 Postthrombotic syndrome without complications of unspecified extremity: Secondary | ICD-10-CM

## 2012-10-13 DIAGNOSIS — I872 Venous insufficiency (chronic) (peripheral): Secondary | ICD-10-CM

## 2012-10-13 DIAGNOSIS — E039 Hypothyroidism, unspecified: Secondary | ICD-10-CM

## 2012-10-13 DIAGNOSIS — K219 Gastro-esophageal reflux disease without esophagitis: Secondary | ICD-10-CM

## 2012-10-13 DIAGNOSIS — L0390 Cellulitis, unspecified: Secondary | ICD-10-CM

## 2012-10-13 DIAGNOSIS — I83009 Varicose veins of unspecified lower extremity with ulcer of unspecified site: Secondary | ICD-10-CM

## 2012-10-13 DIAGNOSIS — M7989 Other specified soft tissue disorders: Secondary | ICD-10-CM

## 2012-10-13 DIAGNOSIS — I10 Essential (primary) hypertension: Secondary | ICD-10-CM

## 2012-10-13 DIAGNOSIS — I83019 Varicose veins of right lower extremity with ulcer of unspecified site: Secondary | ICD-10-CM

## 2012-10-13 DIAGNOSIS — F0280 Dementia in other diseases classified elsewhere without behavioral disturbance: Secondary | ICD-10-CM

## 2012-10-13 DIAGNOSIS — F341 Dysthymic disorder: Secondary | ICD-10-CM

## 2012-10-13 MED ORDER — MEMANTINE HCL ER 14 MG PO CP24: 1.0000 | ORAL_CAPSULE | Freq: Every day | ORAL | Status: DC

## 2012-10-13 MED ORDER — NIFEDIPINE ER OSMOTIC RELEASE 30 MG PO TB24: ORAL_TABLET | ORAL | Status: DC

## 2012-10-13 MED ORDER — FENTANYL 12 MCG/HR TD PT72: 1.0000 | MEDICATED_PATCH | TRANSDERMAL | Status: DC

## 2012-10-13 MED ORDER — GABAPENTIN 300 MG PO CAPS: ORAL_CAPSULE | ORAL | Status: DC

## 2012-10-13 NOTE — Patient Instructions (Signed)
Take medications as noted below. Use JuxtaLite on both legs.

## 2012-10-17 DIAGNOSIS — G309 Alzheimer's disease, unspecified: Secondary | ICD-10-CM | POA: Insufficient documentation

## 2012-10-17 NOTE — Progress Notes (Signed)
Subjective:    Patient ID: Jennifer Brennan, female    DOB: 11-11-1923, 79 y.o.   MRN: [id number]  HPI Patient's daughter would like me to review the condition of her mother's legs. She has been discharged from the wound center since Thursday of the week prior to this. There is a red color. She previously was on antibiotics, but has not had any since her last pill. The redness persists. She is worried about the possibility of persistent infection.  Patient is back on probiotics and she was off the antibiotics. The family wants no she can stop these when she is finished with the current box.  In regard to her Memory loss and presumed Alzheimer's disease,  the family would like to know if there are medications available to help preserve her memory and perhaps to assist sleep, anger management,and uncooperativeness.  She needs a refill on her fentanyl patch and on her gabapentin.  The wound care center prescribed Juxta-Lite Stockings. Since her legs are swollen on both sides, although worse on the left, her daughter wants to go with compression level she should get as well as whether she should have stockings for both legs.  Hypertension is under good control. She needs a prescription for Nifedical XL 30 mg.  The patient and her family worried about her "successive" drowsiness. She sleeps in her chair. She doesn't seem to want to sleep at night. She keeps getting sleepy during the day. She gets sleepy when talking on the phone.   Review of Systems  Constitutional: Positive for fatigue. Negative for fever, diaphoresis, activity change, appetite change and unexpected weight change.  HENT: Positive for hearing loss. Negative for ear pain, congestion, neck pain and neck stiffness.   Eyes: Positive for visual disturbance.       Prescription lenses  Respiratory: Negative for apnea, cough, choking, chest tightness, shortness of breath and wheezing.   Cardiovascular: Positive for leg swelling.  Negative for chest pain and palpitations.  Gastrointestinal: Negative for abdominal pain and abdominal distention.  Endocrine: Negative for cold intolerance, polydipsia, polyphagia and polyuria.  Genitourinary: Positive for frequency. Negative for decreased urine volume, difficulty urinating and pelvic pain.       Urinary incontinence.  Musculoskeletal: Positive for myalgias, arthralgias and gait problem. Negative for back pain and joint swelling.  Skin:       Erythema of the left lower leg. Associated with venous insufficiency and chronic edema. The area is not warm. It is mildly tender to touch.  Allergic/Immunologic: Negative.   Neurological: Positive for weakness. Negative for dizziness, tremors, seizures, syncope, facial asymmetry, speech difficulty, light-headedness, numbness and headaches.  Hematological: Negative.   Psychiatric/Behavioral: Positive for behavioral problems, confusion, sleep disturbance, decreased concentration and agitation. Negative for suicidal ideas and hallucinations. The patient is nervous/anxious. The patient is not hyperactive.        Objective:   Physical Exam  Constitutional: No distress.  HENT:  Head: Normocephalic and atraumatic.  Diminished hearing bilaterally  Eyes: Conjunctivae and EOM are normal. Left eye exhibits no discharge.  Neck: Normal range of motion. Neck supple. No JVD present. No tracheal deviation present. No thyromegaly present.  Cardiovascular: Normal rate, regular rhythm and normal heart sounds.  Exam reveals no gallop and no friction rub.   No murmur heard. Pulmonary/Chest: Effort normal and breath sounds normal. No respiratory distress. She has no wheezes. She has no rales.  Abdominal: Bowel sounds are normal. She exhibits no distension. There is no tenderness.  Musculoskeletal: Normal  range of motion. She exhibits edema and tenderness.  Lymphadenopathy:    She has no cervical adenopathy.  Neurological: She is alert. No cranial  nerve deficit. Coordination normal.  Skin: Skin is warm and dry. She is not diaphoretic. There is erythema. No pallor.  Psychiatric:  Significant dementia. Althoughcalm at our office today, the family describes her episodes of agitation at home.          Assessment & Plan:  1. Unspecified hypothyroidism Stable on current medications. - TSH  2. Chronic venous insufficiency Recommended Juxta-Lite stockings at 20 mm mercury compression for both legs.  3. HYPERTENSION Continue current medications. Refill Nifedical.  4. Peripheral arterial disease Continue to monitor.  5. Venous ulcer of right lower extremity with varicose veins The ulcer is slowly healing. Juxta lite stockings should help prevent future problems.  6. GERD (gastroesophageal reflux disease) Stable on current medications.  7. Post-phlebitic syndrome Chronic edema and painful extremities with ulcerations.  8. ANXIETY DEPRESSION I believe this is a manifestation of her progressive memory loss. I believe this to be related to Alzheimer's disease.  9. Cellulitis I see no evidence for infection today. Further antibiotic treatment is not warranted. Erythema is related to chronic venous inflammation and irritation.  10. Leg swelling Postphlebitic syndrome  11. Alzheimer's disease Started on amantadine  12. Weakness generalized Progressive deterioration of aging individual significant memory issues. No additional action needed at this time. Her drowsiness and sleepiness are result of her Alzheimer's disease and poor sleep efficiency at night. Patient would not tolerate a sleep study in her current condition.

## 2012-10-21 ENCOUNTER — Telehealth: Payer: Self-pay | Admitting: Internal Medicine

## 2012-10-21 ENCOUNTER — Other Ambulatory Visit: Payer: Self-pay | Admitting: *Deleted

## 2012-10-21 MED ORDER — LEVOTHYROXINE SODIUM 75 MCG PO TABS: ORAL_TABLET | ORAL | Status: DC

## 2012-10-21 NOTE — Progress Notes (Addendum)
TSH is very high. Needs to resume levothyroxine 75 mcg qd. Call patient's family and send RX for 100 tabs. Take one daily for thyroid supplement.

## 2012-10-21 NOTE — Telephone Encounter (Signed)
Call patient's family. She is significantly hypothyroid. She needs to resume levothyroxine 75 mcg daily. Send Rx for 100 tablets to be taken 1 daily for thyroid supplement.

## 2012-10-21 NOTE — Telephone Encounter (Signed)
Patient daughter has been notified and faxed over to [facility]

## 2012-10-25 ENCOUNTER — Ambulatory Visit (INDEPENDENT_AMBULATORY_CARE_PROVIDER_SITE_OTHER): Payer: Medicare Other | Admitting: Pharmacotherapy

## 2012-10-25 ENCOUNTER — Encounter: Payer: Self-pay | Admitting: Pharmacotherapy

## 2012-10-25 VITALS — BP 126/64 | HR 66 | Temp 95.4°F | Resp 18 | Wt 164.0 lb

## 2012-10-25 DIAGNOSIS — I6350 Cerebral infarction due to unspecified occlusion or stenosis of unspecified cerebral artery: Secondary | ICD-10-CM

## 2012-10-25 DIAGNOSIS — Z7901 Long term (current) use of anticoagulants: Secondary | ICD-10-CM

## 2012-10-25 DIAGNOSIS — I82409 Acute embolism and thrombosis of unspecified deep veins of unspecified lower extremity: Secondary | ICD-10-CM

## 2012-10-25 MED ORDER — WARFARIN SODIUM 5 MG PO TABS: ORAL_TABLET | ORAL | Status: DC

## 2012-10-25 NOTE — Progress Notes (Signed)
Subjective:     Patient ID: Jennifer Brennan, female   DOB: August 07, 1923, 78 y.o.   MRN: [id number]  HPI Last INR on 10/11/12  was low at 1.8 Coumadin was increased to 5mg  QD except 7.5mg  on MWF She has finished all antibiotics. Denies missed doses. No unusual bleeding or bruising No CP, SOB, falls Consistent with vitamin K   Review of Systems  HENT: Negative for nosebleeds.   Respiratory: Negative for chest tightness, shortness of breath and wheezing.   Cardiovascular: Negative for chest pain.  Gastrointestinal: Negative for blood in stool and anal bleeding.  Genitourinary: Negative for hematuria.       Objective:   Physical Exam  Constitutional: She is oriented to person, place, and time. She appears well-developed and well-nourished.  HENT:  Head: Normocephalic.  Eyes: Pupils are equal, round, and reactive to light.  Neck: Normal range of motion.  Cardiovascular: Normal rate.   Pulmonary/Chest: Effort normal and breath sounds normal.  Neurological: She is alert and oriented to person, place, and time.  Skin: Skin is warm and dry.  Psychiatric: She has a normal mood and affect. Her behavior is normal.    BP 126/64    HR:  66   Wt:  164 lb     Resp:  18     Temp:  95.4     Assessment:     INR 1.6 Low INR due to being off all antibiotics     Plan:     1.  INR below target 2-3 2.  Increase Coumadin 7.5mg  QD except 5mg  T/Th 3.  DVT - no evidence of clotting 4.  CVA - stable 5.  RTC 2 weeks

## 2012-10-25 NOTE — Patient Instructions (Signed)
Take 1 & 1/2 tablets (7.5mg ) daily except 1 tablet (5mg ) on Tuesdays and Thursdays

## 2012-10-26 ENCOUNTER — Other Ambulatory Visit: Payer: Self-pay | Admitting: Geriatric Medicine

## 2012-10-26 NOTE — Telephone Encounter (Signed)
Jennifer Brennan called and said that she is concerned that the warfarin is not working and wants her mother to be switched back to coumadin. I called the pharmacy and spoke with the pharmacist and they said they would change it. I called patient and made her aware. She will be going to pick the coumadin up today.

## 2012-10-29 ENCOUNTER — Encounter: Payer: Self-pay | Admitting: *Deleted

## 2012-11-01 ENCOUNTER — Encounter: Payer: Self-pay | Admitting: Pharmacotherapy

## 2012-11-01 ENCOUNTER — Ambulatory Visit (INDEPENDENT_AMBULATORY_CARE_PROVIDER_SITE_OTHER): Payer: Medicare Other | Admitting: Pharmacotherapy

## 2012-11-01 VITALS — BP 132/80 | HR 60 | Temp 97.1°F | Resp 16 | Wt 167.6 lb

## 2012-11-01 DIAGNOSIS — Z7901 Long term (current) use of anticoagulants: Secondary | ICD-10-CM

## 2012-11-01 DIAGNOSIS — I82409 Acute embolism and thrombosis of unspecified deep veins of unspecified lower extremity: Secondary | ICD-10-CM

## 2012-11-01 DIAGNOSIS — I6350 Cerebral infarction due to unspecified occlusion or stenosis of unspecified cerebral artery: Secondary | ICD-10-CM

## 2012-11-01 LAB — POCT INR: INR: 1.7

## 2012-11-01 MED ORDER — WARFARIN SODIUM 5 MG PO TABS: ORAL_TABLET | ORAL | Status: DC

## 2012-11-01 NOTE — Progress Notes (Signed)
Subjective:     Patient ID: Jennifer Brennan, female   DOB: 1923-07-25, 79 y.o.   MRN: [id number]  HPI Goal INR 2-3 Daughter changed generic warfarin to brand name Coumadin. Denies unusual bleeding or bruising  No CP, more shortness of breath, falls Consistent with vitamin K intake   Review of Systems  HENT: Negative for nosebleeds.   Respiratory: Positive for shortness of breath and wheezing. Negative for chest tightness.   Cardiovascular: Negative for chest pain.  Gastrointestinal: Negative for blood in stool and anal bleeding.  Genitourinary: Negative for hematuria.       Objective:   Physical Exam  Constitutional: She appears well-developed and well-nourished.  Eyes: Conjunctivae are normal. Pupils are equal, round, and reactive to light.  Cardiovascular: Normal rate and regular rhythm.   Pulmonary/Chest: Effort normal. She has wheezes.  Neurological: She is alert.  Skin: Skin is warm and dry.  Psychiatric: Her behavior is normal.    BP:   132/80   HR:  60  Wt:  167   Temp:  97.1     Assessment:     INR 1.7     Plan:     1.  INR below goal 2-3 2.  Increase Coumadin to 7.5mg  QD

## 2012-11-01 NOTE — Patient Instructions (Signed)
Take 1 & 1/2 tablets of Coumadin daily

## 2012-11-04 ENCOUNTER — Ambulatory Visit: Payer: Self-pay | Admitting: Nurse Practitioner

## 2012-11-04 ENCOUNTER — Ambulatory Visit (INDEPENDENT_AMBULATORY_CARE_PROVIDER_SITE_OTHER): Payer: Medicare Other | Admitting: Internal Medicine

## 2012-11-04 ENCOUNTER — Encounter: Payer: Self-pay | Admitting: Internal Medicine

## 2012-11-04 VITALS — BP 126/72 | HR 61 | Temp 98.1°F | Resp 14

## 2012-11-04 DIAGNOSIS — L249 Irritant contact dermatitis, unspecified cause: Secondary | ICD-10-CM

## 2012-11-04 DIAGNOSIS — L259 Unspecified contact dermatitis, unspecified cause: Secondary | ICD-10-CM

## 2012-11-04 DIAGNOSIS — L8992 Pressure ulcer of unspecified site, stage 2: Secondary | ICD-10-CM

## 2012-11-04 DIAGNOSIS — L8990 Pressure ulcer of unspecified site, unspecified stage: Secondary | ICD-10-CM

## 2012-11-04 NOTE — Patient Instructions (Addendum)
Monitor legs and buttocks for redness, increased ulceration, drainage, foul smell, pain or fever.  If these occur, let us know.   Apply zinc to the affected area.  Keep clean and dry.  Also use barrier cream to prevent dermatitis (diaper rash).   Avoid prolonged pressure, as well (more than 2 hours in one position).

## 2012-11-04 NOTE — Progress Notes (Signed)
Patient ID: Jennifer Brennan, female   DOB: 1924-06-14, 79 y.o.   MRN: [id number]  Allergies  Allergen Reactions  . Bactrim (Sulfamethoxazole W-Trimethoprim) Other (See Comments)    MD stopped due to kidney failure Also causes lips to swell  . Furosemide Other (See Comments)    MD stopped due to kidney failure  . Percocet (Oxycodone-Acetaminophen) Other (See Comments)    Causes SEIZURES  . Valsartan Other (See Comments)    MD stopped due to kidney failure  . Doxycycline Swelling    Causes lips to swell, wheezing  . Penicillins Hives  . Morphine Sulfate Itching and Rash  . Sertraline Hcl Rash    Chief Complaint  Patient presents with  . sore on bottom    HPI: Patient is a 79 y.o. AA female with h/o dementia, blindness, and chronic venous stasis with prior ulcerations that required wound care treatment seen in the office today for a new area of skin breakdown on her left buttock by the gluteal crease.   Less than cm stage II ulcer left gluteal crease--? Pressure vs. Ulceration due to dermatitis.    Review of Systems:  Review of Systems  Constitutional: Negative for fever and malaise/fatigue.  Eyes:       Blind  Musculoskeletal: Positive for back pain.  Skin: Negative for itching and rash.       Ulceration as in hpi, PE  Neurological: Positive for weakness.  Psychiatric/Behavioral: Positive for memory loss.     Past Medical History  Diagnosis Date  . Hypertension   . Hypothyroidism   . Seizure disorder   . History of cerebrovascular accident   . Severe stage glaucoma     legally blind  . Depression with anxiety   . OAB (overactive bladder)   . ADENOCARCINOMA, LEFT BREAST 04/11/2010    s/p L mastectomy, on femara x 28yr  . BACK PAIN, LUMBAR, CHRONIC   . DVT 05/2007    RLE following R THR - chronic coumadin, chronic RLE pain  . GLAUCOMA   . OSTEOARTHRITIS, HANDS, BILATERAL   . OSTEOPENIA   . Retinal ischemia   . Sciatica of right side     chronic RLE pain,  multifactorial - MRI T/L spine 02/2011  . VITAMIN D DEFICIENCY dx 08/2008  . Venous ulcer of right lower extremity with varicose veins   . Dementia   . Vulvar abscess   . Unspecified hypothyroidism   . Unspecified vitamin D deficiency   . Unspecified urinary incontinence   . Renal disorder   . Varicose veins of lower extremity   . Varicose veins of lower extremities with ulcer   . Unspecified disorder of kidney and ureter   . Stevens-Johnson syndrome   . Sebaceous cyst   . Osteoarthrosis, unspecified whether generalized or localized, unspecified site   . Disorder of bone and cartilage, unspecified   . Contusion of lower leg    Past Surgical History  Procedure Laterality Date  . Total abdominal hysterectomy    . Total hip arthroplasty  10/2006    right hip  . Refractive surgery      B/L  . Breast surgery  03/2010    breast biopsy, L mastectomy   . Tonsillectomy     Social History:   reports that she quit smoking about 33 years ago. Her smoking use included Cigarettes. She smoked 0.00 packs per day for 45 years. She has never used smokeless tobacco. She reports that she does not drink alcohol or  use illicit drugs.  Family History  Problem Relation Age of Onset  . Ovarian cancer Mother   . Cancer Mother   . Deep vein thrombosis Mother   . Heart disease Mother   . Arthritis Other     grandparents  . Lung cancer Other   . Heart disease Other     parent  . Cancer Father     BRAIN TUMOR  . Cancer Brother   . Hyperlipidemia Brother   . Hypertension Brother   . Stroke Brother   . Arthritis Brother   . Sickle cell anemia Daughter   . Heart disease Daughter   . Sickle cell anemia Daughter     Medications: Patient's Medications  New Prescriptions   No medications on file  Previous Medications   ACETAMINOPHEN (TYLENOL) 500 MG TABLET    Take 1,000 mg by mouth every 6 (six) hours as needed. pain   ALBUTEROL (PROVENTIL HFA;VENTOLIN HFA) 108 (90 BASE) MCG/ACT INHALER     Inhale 2 puffs into the lungs every 6 (six) hours as needed. For shortness of breath.   BRIMONIDINE (ALPHAGAN) 0.15 % OPHTHALMIC SOLUTION    Place 1 drop into both eyes 2 (two) times daily.   CHOLECALCIFEROL 2000 UNITS TABS    Take 1 tablet by mouth every morning.   DIPHENHYDRAMINE (BENADRYL) 25 MG TABLET    Take 12.5 mg by mouth 2 (two) times daily as needed. Pt took while on antibiotics   DOCUSATE SODIUM (COLACE) 100 MG CAPSULE    Take 100 mg by mouth daily. Take one tablet twice a day as needed for  Constipation   DORZOLAMIDE-TIMOLOL (COSOPT) 22.3-6.8 MG/ML OPHTHALMIC SOLUTION    Place 1 drop into both eyes 2 (two) times daily.     FENTANYL (DURAGESIC - DOSED MCG/HR) 12 MCG/HR    Place 1 patch (12.5 mcg total) onto the skin every 3 (three) days.   FLUTICASONE-SALMETEROL (ADVAIR) 250-50 MCG/DOSE AEPB    Inhale 1 puff into the lungs 2 (two) times daily as needed. For shortness of breath   GABAPENTIN (NEURONTIN) 300 MG CAPSULE    Two capsules at bed to help pains   IPRATROPIUM-ALBUTEROL (COMBIVENT RESPIMAT) 20-100 MCG/ACT AERS RESPIMAT    Inhale 2 puffs into the lungs every 6 (six) hours.   LATANOPROST (XALATAN) 0.005 % OPHTHALMIC SOLUTION    Place 1 drop into both eyes at bedtime.     LETROZOLE (FEMARA) 2.5 MG TABLET    Take 1 tablet (2.5 mg total) by mouth at bedtime.   LEVETIRACETAM (KEPPRA) 500 MG TABLET    Take 1 tablet (500 mg total) by mouth 2 (two) times daily.   LEVOTHYROXINE (SYNTHROID, LEVOTHROID) 75 MCG TABLET    Take one tablet once a day for thyroid supplement   MEMANTINE HCL ER 14 MG CP24    Take 1 capsule by mouth daily. To help preserve memory   MIRABEGRON ER (MYRBETRIQ) 25 MG TB24    Take 25 mg by mouth daily. Take one tablet once a day   MULTIPLE VITAMINS-MINERALS (MULTIVITAMIN GUMMIES ADULT) CHEW    Chew by mouth. Chew two gummi es a day   NIFEDIPINE (NIFEDICAL XL) 30 MG 24 HR TABLET    One daily to control BP   OMEPRAZOLE (PRILOSEC) 20 MG CAPSULE    Take 20 mg by mouth daily.     PILOCARPINE (PILOCAR) 1 % OPHTHALMIC SOLUTION    Place 1 drop into both eyes 3 (three) times daily.    TIOTROPIUM (SPIRIVA)  18 MCG INHALATION CAPSULE    Place 1 capsule (18 mcg total) into inhaler and inhale daily.   WARFARIN (COUMADIN) 5 MG TABLET    Take 1 & 1/2 tablets daily  Modified Medications   No medications on file  Discontinued Medications   No medications on file     Physical Exam: Filed Vitals:   11/04/12 1605  BP: 126/72  Pulse: 61  Temp: 98.1 F (36.7 C)  TempSrc: Oral  Resp: 14   Physical Exam  Constitutional: No distress.  Overweight AA female, NAD seated in wheelchair at first, husband and daughter assisted in standing to visualize ulcer  HENT:  Head: Normocephalic and atraumatic.  Skin: Skin is warm and dry. No rash noted.  Less than 1cm round well circumscribed area of superficial loss of skin, healthy-appearing pink tissue visible throughout wound w/o drainage, surrounding warmth or erythema, no foul smell, mild tenderness as the area was cleaned  No ulcers were present on legs at present when compression hose were removed      Labs reviewed: Basic Metabolic Panel:  Recent Labs  13/24/40 1445  02/10/12 2215  02/27/12 1548  03/02/12 0633 03/04/12 0438  03/26/12 2004 04/27/12 1310 09/25/12 1444 10/13/12 1737  NA 137  --   --   < > 142  < >  --   --   < > 142 141 140  --   K 4.7  --   --   < > 3.5  < >  --   --   < > 3.7 4.1 4.9  --   CL 102  --   --   < > 105  < >  --   --   < > 106 105 102  --   CO2 26  --   --   < > 29  < >  --   --   < > 26 28 26   --   GLUCOSE 86  --   --   < > 90  < >  --   --   < > 110* 102* 81  --   BUN 27*  --   --   < > 14  < >  --   --   < > 16 15.0 12  --   CREATININE 2.31*  --   --   < > 1.12*  < >  --   --   < > 1.08 1.2* 1.22*  --   CALCIUM 9.2  --   --   < > 9.2  < >  --   --   < > 9.8 9.8 9.2  --   MG  --   --  2.3  --  2.1  --   --   --   --   --   --   --   --   PHOS  --   --  3.4  --  1.9*  --   --   --    --   --   --   --   --   TSH  --   < > 4.744*  --   --   --  16.576* 27.420*  --   --   --   --  140.900*  < > = values in this interval not displayed. Liver Function Tests:  Recent Labs  03/02/12 0005 03/02/12 0530 04/27/12 1310  AST 27 18 20   ALT 15 15 17  ALKPHOS 44 45 66  BILITOT 0.4 0.3 0.44  PROT 6.4 6.2 7.3  ALBUMIN 2.8* 2.8* 3.5    Recent Labs  03/02/12 0530  AMMONIA 21   CBC:  Recent Labs  03/26/12 2004 04/27/12 1310 09/25/12 1444  WBC 7.2 7.6 6.7  NEUTROABS 3.6 4.0 2.6  HGB 12.1 11.9 14.4  HCT 37.0 36.1 41.6  MCV 81.5 81.9 73.8*  PLT 224 274 171   Lipid Panel:  Recent Labs  02/28/12 0515  CHOL 249*  HDL 55  LDLCALC 168*  TRIG 130  CHOLHDL 4.5   Assessment/Plan Irritant contact dermatitis Due to use of depends and urinary incontinence, this is occuring in her gluteal crease.  Appears family does keep her very clean and dry as best they can.  This was reinforced and use of barrier cream after cleansing to prevent breakdown and irritation.    Pressure ulcer stage II Unclear if this is truly a pressure area or simply circular excoriated area due to irritant dermatitis in gluteal crease from urine and depends rubbing. Discussed regular repositioning to prevent pressure areas.     Labs/tests ordered:  None today

## 2012-11-07 DIAGNOSIS — L249 Irritant contact dermatitis, unspecified cause: Secondary | ICD-10-CM | POA: Insufficient documentation

## 2012-11-07 DIAGNOSIS — L8992 Pressure ulcer of unspecified site, stage 2: Secondary | ICD-10-CM | POA: Insufficient documentation

## 2012-11-07 NOTE — Assessment & Plan Note (Signed)
Due to use of depends and urinary incontinence, this is occuring in her gluteal crease.  Appears family does keep her very clean and dry as best they can.  This was reinforced and use of barrier cream after cleansing to prevent breakdown and irritation.

## 2012-11-07 NOTE — Assessment & Plan Note (Signed)
Unclear if this is truly a pressure area or simply circular excoriated area due to irritant dermatitis in gluteal crease from urine and depends rubbing. Discussed regular repositioning to prevent pressure areas.

## 2012-11-08 ENCOUNTER — Encounter: Payer: Self-pay | Admitting: Pharmacotherapy

## 2012-11-08 ENCOUNTER — Ambulatory Visit (INDEPENDENT_AMBULATORY_CARE_PROVIDER_SITE_OTHER): Payer: Medicare Other | Admitting: Pharmacotherapy

## 2012-11-08 VITALS — BP 134/62 | HR 55 | Temp 97.4°F | Resp 14

## 2012-11-08 DIAGNOSIS — Z7901 Long term (current) use of anticoagulants: Secondary | ICD-10-CM

## 2012-11-08 LAB — POCT INR
INR: 2.1
INR: 2.7

## 2012-11-08 NOTE — Patient Instructions (Signed)
Continue Coumadin 7.5mg  daily (1 & 1/2 tablets)

## 2012-11-08 NOTE — Progress Notes (Signed)
Subjective:     Patient ID: Jennifer Brennan, female   DOB: Sep 03, 1923, 79 y.o.   MRN: [id number]  HPI Last INR was low at 1.7 Coumadin was increased to 7.5mg  daily She has also started her thyroid supplement No missed doses. No unusual bleeding or bruising. No CP, SOB, falls Consistent with vitamin K   Review of Systems  HENT: Negative for nosebleeds.   Respiratory: Negative for chest tightness and shortness of breath.   Cardiovascular: Negative for chest pain.  Gastrointestinal: Negative for blood in stool and anal bleeding.  Genitourinary: Negative for hematuria.       Objective:   Physical Exam  Constitutional: She appears well-developed and well-nourished.  Eyes: Conjunctivae are normal. Pupils are equal, round, and reactive to light.  Cardiovascular: Normal rate and regular rhythm.   Pulmonary/Chest: Effort normal and breath sounds normal.  Neurological: She is alert.  Skin: Skin is warm and dry.  Psychiatric:  Flat affect   BP:  134/62   HR:  55   Temp:  97.4     Assessment:     INR 2.7     Plan:     1.  INR at goal 2-3 2.  Continue Coumadin 7.5mg  daily

## 2012-11-11 ENCOUNTER — Telehealth: Payer: Self-pay | Admitting: *Deleted

## 2012-11-11 ENCOUNTER — Other Ambulatory Visit: Payer: Medicare Other | Admitting: Lab

## 2012-11-11 NOTE — Telephone Encounter (Signed)
Pt called and cancel her appts for today and r/s for 11/12/12 @1 :30pm...td

## 2012-11-12 ENCOUNTER — Telehealth: Payer: Self-pay | Admitting: *Deleted

## 2012-11-12 ENCOUNTER — Other Ambulatory Visit: Payer: Medicare Other | Admitting: Lab

## 2012-11-12 ENCOUNTER — Ambulatory Visit (HOSPITAL_BASED_OUTPATIENT_CLINIC_OR_DEPARTMENT_OTHER): Payer: Medicare Other | Admitting: Lab

## 2012-11-12 DIAGNOSIS — E559 Vitamin D deficiency, unspecified: Secondary | ICD-10-CM

## 2012-11-12 DIAGNOSIS — C50219 Malignant neoplasm of upper-inner quadrant of unspecified female breast: Secondary | ICD-10-CM

## 2012-11-12 DIAGNOSIS — C50912 Malignant neoplasm of unspecified site of left female breast: Secondary | ICD-10-CM

## 2012-11-12 LAB — COMPREHENSIVE METABOLIC PANEL (CC13)
AST: 28 U/L (ref 5–34)
Albumin: 3.5 g/dL (ref 3.5–5.0)
Alkaline Phosphatase: 63 U/L (ref 40–150)
BUN: 15.7 mg/dL (ref 7.0–26.0)
Calcium: 9.1 mg/dL (ref 8.4–10.4)
Chloride: 106 mEq/L (ref 98–107)
Glucose: 86 mg/dl (ref 70–99)
Potassium: 3.4 mEq/L — ABNORMAL LOW (ref 3.5–5.1)
Sodium: 143 mEq/L (ref 136–145)
Total Protein: 7.6 g/dL (ref 6.4–8.3)

## 2012-11-12 LAB — CBC WITH DIFFERENTIAL/PLATELET
Basophils Absolute: 0.1 10*3/uL (ref 0.0–0.1)
Eosinophils Absolute: 0.1 10*3/uL (ref 0.0–0.5)
HGB: 12.3 g/dL (ref 11.6–15.9)
NEUT#: 2.9 10*3/uL (ref 1.5–6.5)
RBC: 4.79 10*6/uL (ref 3.70–5.45)
RDW: 19.9 % — ABNORMAL HIGH (ref 11.2–14.5)
WBC: 6.1 10*3/uL (ref 3.9–10.3)
lymph#: 2.6 10*3/uL (ref 0.9–3.3)

## 2012-11-12 NOTE — Telephone Encounter (Signed)
Tried to return the pt call. Pt had left vm stating to cancel her lab appt for today and to r/s for next week. appt was made for 11/16/12 @1 :30pm...td

## 2012-11-13 LAB — VITAMIN D 25 HYDROXY (VIT D DEFICIENCY, FRACTURES): Vit D, 25-Hydroxy: 45 ng/mL (ref 30–89)

## 2012-11-16 ENCOUNTER — Other Ambulatory Visit: Payer: Medicare Other

## 2012-11-18 ENCOUNTER — Telehealth: Payer: Self-pay | Admitting: Oncology

## 2012-11-18 ENCOUNTER — Ambulatory Visit (HOSPITAL_BASED_OUTPATIENT_CLINIC_OR_DEPARTMENT_OTHER): Payer: Medicare Other | Admitting: Oncology

## 2012-11-18 ENCOUNTER — Ambulatory Visit (HOSPITAL_COMMUNITY)
Admission: RE | Admit: 2012-11-18 | Discharge: 2012-11-18 | Disposition: A | Discharge status: Home / Self Care | Payer: Medicare Other | Source: Ambulatory Visit | Attending: Oncology | Admitting: Oncology

## 2012-11-18 VITALS — BP 167/78 | HR 66 | Temp 98.4°F | Resp 20 | Ht 63.0 in | Wt 161.5 lb

## 2012-11-18 DIAGNOSIS — R229 Localized swelling, mass and lump, unspecified: Secondary | ICD-10-CM

## 2012-11-18 DIAGNOSIS — C50919 Malignant neoplasm of unspecified site of unspecified female breast: Secondary | ICD-10-CM | POA: Insufficient documentation

## 2012-11-18 DIAGNOSIS — C50912 Malignant neoplasm of unspecified site of left female breast: Secondary | ICD-10-CM

## 2012-11-18 DIAGNOSIS — H547 Unspecified visual loss: Secondary | ICD-10-CM

## 2012-11-18 MED ORDER — LETROZOLE 2.5 MG PO TABS: 2.5000 mg | ORAL_TABLET | Freq: Every day | ORAL | Status: DC

## 2012-11-18 NOTE — Telephone Encounter (Signed)
Pt sent to [facility] for xray and husband given appt schedule for November.

## 2012-11-18 NOTE — Progress Notes (Signed)
ID: Jennifer Brennan   DOB: 12-May-1924  MR#: [id number]  CSN#:[id number]  PCP: Kimber Relic, MD GYN:  SU:  OTHER MD:   HISTORY OF PRESENT ILLNESS: The patient had some pain in her left breast associated with a palpable abnormality. She brought this to Dr. Mechele Collin attention and he set her up for bilateral diagnostic mammography and left ultrasonography which was performed October 11th, 2011 at [facility]. Dr. Manson Passey found a spiculated mass in the upper inner quadrant of the left breast which was palpable and associated with skin dimpling. Ultrasound shows a hypoechoic mass with acoustic shadowing, spiculated, measuring 2.0 cm. The left axilla showed no enlarged lymph nodes. Biopsy was performed the same day and showed (invasive ductal carcinoma, Grade 2, with some scirrhous changes. The tumor was estrogen 97% positive, progesterone 64% positive with a proliferative marker of 18% and no amplification of Her-2 by CISH with a ratio of 0.94.  With this information, the patient was referred to Dr. Magnus Ivan, and she was felt not to be an MRI candidate because of her multiple medical problems as listed below. The case was presented at the Multidisciplinary Breast Cancer Conference on October 19th and the consensus there was that the patient was a good candidate for breast conserving surgery followed by hormones.  Accordingly, the patient is status post left mastectomy and sentinel lymph node sampling in November of 2011 for a T2 N0, grade 3 invasive ductal carcinoma. Tumor was strongly ER and PR positive, HER-2/neu negative, with a borderline MIP-1. She began letrozole in May of 2012, 2.5 mg daily with good tolerance.     INTERVAL HISTORY: Jennifer Brennan returns today accompanied by her husband and her 2 daughters for routine followup of her left breast carcinoma.  She continues on letrozole with good tolerance.   REVIEW OF SYSTEMS: Jennifer Brennan tells me she is now completely blind. All she sees is "wiped". She  has burning pain in both feet. She is on gabapentin for that with some relief. Her husband placed her compression stockings today and not as a "bump" in her right lower leg that he wanted me to look at she had venous stasis ulcers in the left leg which were successfully treated through the wound Center. She feels anxious and depressed and wishes she had something to do during the day. She used to enjoy playing P. knuckle. There have been no unusual headaches, nausea, vomiting, cough, phlegm production, pleurisy, or bleeding. A detailed review of systems was otherwise stable   PAST MEDICAL HISTORY: Past Medical History  Diagnosis Date  . Hypertension   . Hypothyroidism   . Seizure disorder   . History of cerebrovascular accident   . Severe stage glaucoma     legally blind  . Depression with anxiety   . OAB (overactive bladder)   . ADENOCARCINOMA, LEFT BREAST 04/11/2010    s/p L mastectomy, on femara x 11yr  . BACK PAIN, LUMBAR, CHRONIC   . DVT 05/2007    RLE following R THR - chronic coumadin, chronic RLE pain  . GLAUCOMA   . OSTEOARTHRITIS, HANDS, BILATERAL   . OSTEOPENIA   . Retinal ischemia   . Sciatica of right side     chronic RLE pain, multifactorial - MRI T/L spine 02/2011  . VITAMIN D DEFICIENCY dx 08/2008  . Venous ulcer of right lower extremity with varicose veins   . Dementia   . Vulvar abscess   . Unspecified hypothyroidism   . Unspecified vitamin D deficiency   .  Unspecified urinary incontinence   . Renal disorder   . Varicose veins of lower extremity   . Varicose veins of lower extremities with ulcer   . Unspecified disorder of kidney and ureter   . Stevens-Johnson syndrome   . Sebaceous cyst   . Osteoarthrosis, unspecified whether generalized or localized, unspecified site   . Disorder of bone and cartilage, unspecified   . Contusion of lower leg     PAST SURGICAL HISTORY: Past Surgical History  Procedure Laterality Date  . Total abdominal hysterectomy    .  Total hip arthroplasty  10/2006    right hip  . Refractive surgery      B/L  . Breast surgery  03/2010    breast biopsy, L mastectomy   . Tonsillectomy      FAMILY HISTORY Family History  Problem Relation Age of Onset  . Ovarian cancer Mother   . Cancer Mother   . Deep vein thrombosis Mother   . Heart disease Mother   . Arthritis Other     grandparents  . Lung cancer Other   . Heart disease Other     parent  . Cancer Father     BRAIN TUMOR  . Cancer Brother   . Hyperlipidemia Brother   . Hypertension Brother   . Stroke Brother   . Arthritis Brother   . Sickle cell anemia Daughter   . Heart disease Daughter   . Sickle cell anemia Daughter     GYNECOLOGIC HISTORY: She is GX P4. First pregnancy to term at age 65. She went through the change of life at the age of 68 and did take hormones for several years but she cannot recall how many.   SOCIAL HISTORY: The patient's husband of 43 years, Jennifer Brennan, used to work as a Facilities manager in a children's program but now "I am her caretaker." He has some hearing loss. Daughter Jennifer Brennan lives in [place], is retired. Daughter Jennifer Brennan, Jennifer Brennan also lives in [place]. She is disabled secondary to rheumatoid arthritis. Daughter Jennifer Brennan lives in [place]. She is disabled secondary to sickle cell disease. The patient is a Systems analyst.    ADVANCED DIRECTIVES:  She has a Living Will in place although I do not have a copy of it.   HEALTH MAINTENANCE: History  Substance Use Topics  . Smoking status: Former Smoker -- 45 years    Types: Cigarettes    Quit date: 10/16/1979  . Smokeless tobacco: Never Used  . Alcohol Use: No     Colonoscopy:  PAP:  Bone density:  Lipid panel:  Allergies  Allergen Reactions  . Bactrim (Sulfamethoxazole W-Trimethoprim) Other (See Comments)    MD stopped due to kidney failure Also causes lips to swell  . Furosemide Other (See Comments)    MD stopped due to kidney failure  . Percocet  (Oxycodone-Acetaminophen) Other (See Comments)    Causes SEIZURES  . Valsartan Other (See Comments)    MD stopped due to kidney failure  . Doxycycline Swelling    Causes lips to swell, wheezing  . Penicillins Hives  . Morphine Sulfate Itching and Rash  . Sertraline Hcl Rash    Current Outpatient Prescriptions  Medication Sig Dispense Refill  . acetaminophen (TYLENOL) 500 MG tablet Take 1,000 mg by mouth every 6 (six) hours as needed. pain      . albuterol (PROVENTIL HFA;VENTOLIN HFA) 108 (90 BASE) MCG/ACT inhaler Inhale 2 puffs into the lungs every 6 (six) hours as needed. For shortness  of breath.      . brimonidine (ALPHAGAN) 0.15 % ophthalmic solution Place 1 drop into both eyes 2 (two) times daily.      . Cholecalciferol 2000 UNITS TABS Take 1 tablet by mouth every morning.      . diphenhydrAMINE (BENADRYL) 25 MG tablet Take 12.5 mg by mouth 2 (two) times daily as needed. Pt took while on antibiotics      . docusate sodium (COLACE) 100 MG capsule Take 100 mg by mouth daily. Take one tablet twice a day as needed for  Constipation      . dorzolamide-timolol (COSOPT) 22.3-6.8 MG/ML ophthalmic solution Place 1 drop into both eyes 2 (two) times daily.        . fentaNYL (DURAGESIC - DOSED MCG/HR) 12 MCG/HR Place 1 patch (12.5 mcg total) onto the skin every 3 (three) days.  10 patch  0  . Fluticasone-Salmeterol (ADVAIR) 250-50 MCG/DOSE AEPB Inhale 1 puff into the lungs 2 (two) times daily as needed. For shortness of breath      . gabapentin (NEURONTIN) 300 MG capsule Two capsules at bed to help pains  60 capsule  5  . Ipratropium-Albuterol (COMBIVENT RESPIMAT) 20-100 MCG/ACT AERS respimat Inhale 2 puffs into the lungs every 6 (six) hours.      Marland Kitchen latanoprost (XALATAN) 0.005 % ophthalmic solution Place 1 drop into both eyes at bedtime.        Marland Kitchen letrozole (FEMARA) 2.5 MG tablet Take 1 tablet (2.5 mg total) by mouth at bedtime.  90 tablet  3  . levETIRAcetam (KEPPRA) 500 MG tablet Take 1 tablet  (500 mg total) by mouth 2 (two) times daily.  60 tablet  11  . levothyroxine (SYNTHROID, LEVOTHROID) 75 MCG tablet Take one tablet once a day for thyroid supplement  100 tablet  3  . Memantine HCl ER 14 MG CP24 Take 1 capsule by mouth daily. To help preserve memory  30 capsule  5  . mirabegron ER (MYRBETRIQ) 25 MG TB24 Take 25 mg by mouth daily. Take one tablet once a day      . Multiple Vitamins-Minerals (MULTIVITAMIN GUMMIES ADULT) CHEW Chew by mouth. Chew two gummi es a day      . NIFEdipine (NIFEDICAL XL) 30 MG 24 hr tablet One daily to control BP  30 tablet  5  . omeprazole (PRILOSEC) 20 MG capsule Take 20 mg by mouth daily.       . pilocarpine (PILOCAR) 1 % ophthalmic solution Place 1 drop into both eyes 3 (three) times daily.       Marland Kitchen tiotropium (SPIRIVA) 18 MCG inhalation capsule Place 1 capsule (18 mcg total) into inhaler and inhale daily.  30 capsule  5  . warfarin (COUMADIN) 5 MG tablet Take 1 & 1/2 tablets daily  60 tablet  3   No current facility-administered medications for this visit.    OBJECTIVE: Elderly African American female examined in a wheelchair Filed Vitals:   11/18/12 1339  BP: 167/78  Pulse: 66  Temp: 98.4 F (36.9 C)  Resp: 20     Body mass index is 28.62 kg/(m^2).    ECOG FS: 2 Filed Weights   11/18/12 1339  Weight: 161 lb 8 oz (73.256 kg)   Oropharynx clear No cervical or supraclavicular adenopathy Lungs no rales or rhonchi Heart regular rate and rhythm Abd nontender with positive bowel sounds MSK mid thigh high compression stockings in place bilaterally. The left leg appears fine and there is no palpable abnormality.  On the right leg, below the knee, slightly medially, there is a 3 x 2 cm "bump". This is not tender or erythematous. It is firm, not fluctuant. Otherwise there is no significant ankle or lower extremity edema. Neuro: The patient is well-oriented, with pleasant affect Breasts: Right breast is unremarkable. Patient is status post left  mastectomy with no evidence of local recurrence. The left axilla is benign   LAB RESULTS: Lab Results  Component Value Date   WBC 6.1 11/12/2012   NEUTROABS 2.9 11/12/2012   HGB 12.3 11/12/2012   HCT 37.7 11/12/2012   MCV 78.8* 11/12/2012   PLT 158 11/12/2012      Chemistry      Component Value Date/Time   NA 143 11/12/2012 1357   NA 140 09/25/2012 1444   K 3.4* 11/12/2012 1357   K 4.9 09/25/2012 1444   CL 106 11/12/2012 1357   CL 102 09/25/2012 1444   CO2 28 11/12/2012 1357   CO2 26 09/25/2012 1444   BUN 15.7 11/12/2012 1357   BUN 12 09/25/2012 1444   CREATININE 1.4* 11/12/2012 1357   CREATININE 1.22* 09/25/2012 1444   CREATININE 1.14* 01/20/2011 1516      Component Value Date/Time   CALCIUM 9.1 11/12/2012 1357   CALCIUM 9.2 09/25/2012 1444   ALKPHOS 63 11/12/2012 1357   ALKPHOS 45 03/02/2012 0530   AST 28 11/12/2012 1357   AST 18 03/02/2012 0530   ALT 17 11/12/2012 1357   ALT 15 03/02/2012 0530   BILITOT 0.48 11/12/2012 1357   BILITOT 0.3 03/02/2012 0530       Lab Results  Component Value Date   LABCA2 29 04/27/2012     STUDIES:  Most recent right mammogram was at [facility] in October 2013, unremarkable. Bone density obtained at the same time is being faxed   ASSESSMENT: 79 y.o. [employer] woman  (1)  status post left mastectomy and sentinel lymph node sampling November 2011 for a T2 N0, grade 3 invasive ductal carcinoma which was strongly estrogen and progesterone-receptor positive, HER-2-negative, with a borderline MIB-1.   (2)  She started letrozole May 2012 and is tolerating it without unusual side effects.    PLAN: Giulliana is doing well with the letrozole and the plan is to continue that medication for 5 years. I do not know what the "bump" in the lower leg is. I suspect it is a hematoma secondary to trauma, but it does not appear recent, it is not tender or erythematous. I am going to obtain plain films of the lower right leg but I do not expect it to be informative.  Her  creatinine has increased in the last 6 months. I will bring this to the attention of her primary care physician. I do not see any obvious medication in her list that might be responsible. Finally I think she would benefit from being connected with services to the blind, and I am asking our social worker to operationalized that.  The patient will return in 6 months. We will start seeing her on a yearly basis after that. She knows to call for any problems that may develop before that visit.  Celeste Candelas C    11/18/2012

## 2012-11-22 ENCOUNTER — Ambulatory Visit (INDEPENDENT_AMBULATORY_CARE_PROVIDER_SITE_OTHER): Payer: Medicare Other | Admitting: Pharmacotherapy

## 2012-11-22 ENCOUNTER — Encounter: Payer: Self-pay | Admitting: Pharmacotherapy

## 2012-11-22 ENCOUNTER — Other Ambulatory Visit: Payer: Self-pay | Admitting: *Deleted

## 2012-11-22 VITALS — BP 138/80 | HR 68 | Temp 97.2°F | Resp 16 | Wt 148.6 lb

## 2012-11-22 DIAGNOSIS — R413 Other amnesia: Secondary | ICD-10-CM

## 2012-11-22 DIAGNOSIS — Z7901 Long term (current) use of anticoagulants: Secondary | ICD-10-CM

## 2012-11-22 DIAGNOSIS — I82409 Acute embolism and thrombosis of unspecified deep veins of unspecified lower extremity: Secondary | ICD-10-CM

## 2012-11-22 MED ORDER — MEMANTINE HCL ER 28 MG PO CP24: 1.0000 | ORAL_CAPSULE | Freq: Every day | ORAL | Status: DC

## 2012-11-22 MED ORDER — WARFARIN SODIUM 5 MG PO TABS: ORAL_TABLET | ORAL | Status: DC

## 2012-11-22 NOTE — Patient Instructions (Signed)
No Coumadin today Then start Coumadin 7.5mg  daily except 5mg  on Mondays and Fridays

## 2012-11-22 NOTE — Progress Notes (Signed)
Subjective:     Patient ID: Jennifer Brennan, female   DOB: 1923-09-16, 79 y.o.   MRN: [id number]  HPI Last INR on 11/08/12 was OK at 2.7 Current Coumadin dose is 7.5mg  daily. She has resumed her levothyroxine. Denies missed doses. Denies CP, SOB, falls Denies unusual bleeding or bruising Consistent with vitamin K   Review of Systems  HENT: Negative for nosebleeds.   Respiratory: Negative for chest tightness and shortness of breath.   Cardiovascular: Negative for chest pain.  Gastrointestinal: Negative for blood in stool and anal bleeding.  Genitourinary: Negative for hematuria.       Objective:   Physical Exam  Constitutional: She is oriented to person, place, and time. She appears well-developed and well-nourished.  HENT:  Head: Normocephalic.  Cardiovascular: Normal rate, regular rhythm and normal heart sounds.   Pulmonary/Chest: Effort normal and breath sounds normal.  Musculoskeletal:  In a wheelchair  Neurological: She is alert and oriented to person, place, and time.  Skin: Skin is warm and dry.  Psychiatric: She has a normal mood and affect.   BP:  138/80   HR:  68  Wt:  148 lb  Temp:  97.2  RR:  16     Assessment:     INR 4.2     Plan:     1.  INR above target 2-3 due to interaction with levothyroxine and warfarin 2.  Hold Coumadin today. 3.  Decrease Coumadin 7.5mg  daily except 5mg  Mondays / Fridays 4.  RTC 2 wks

## 2012-12-03 ENCOUNTER — Encounter: Payer: Self-pay | Admitting: *Deleted

## 2012-12-06 ENCOUNTER — Ambulatory Visit (INDEPENDENT_AMBULATORY_CARE_PROVIDER_SITE_OTHER): Payer: Medicare Other | Admitting: Pharmacotherapy

## 2012-12-06 ENCOUNTER — Encounter: Payer: Self-pay | Admitting: Pharmacotherapy

## 2012-12-06 VITALS — BP 132/68 | HR 54 | Resp 14 | Ht 63.0 in

## 2012-12-06 DIAGNOSIS — Z7901 Long term (current) use of anticoagulants: Secondary | ICD-10-CM

## 2012-12-06 DIAGNOSIS — I82409 Acute embolism and thrombosis of unspecified deep veins of unspecified lower extremity: Secondary | ICD-10-CM

## 2012-12-06 MED ORDER — WARFARIN SODIUM 5 MG PO TABS: 5.0000 mg | ORAL_TABLET | Freq: Every day | ORAL | Status: DC

## 2012-12-06 NOTE — Progress Notes (Signed)
Subjective:     Patient ID: Jennifer Brennan, female   DOB: 05-19-1924, 79 y.o.   MRN: [id number]  HPI Last INR was high at 4.2 due to restarting her levothyroxine. Coumadin was decreased to 7.5mg  daily except 5mg  Mondays and Fridays after being held for a day. Denies missed doses. Denies unusual bleeding or bruising. Denies CP, SOB, falls Consistent with vitamin K   Review of Systems  HENT: Negative for nosebleeds.   Respiratory: Negative for shortness of breath.   Cardiovascular: Negative for chest pain.  Gastrointestinal: Negative for blood in stool and anal bleeding.  Genitourinary: Negative for hematuria.       Objective:   Physical Exam  Constitutional: She appears well-developed and well-nourished.  HENT:  Head: Normocephalic.  Musculoskeletal:  In a wheelchair  Neurological: She is alert.  Skin: Skin is warm and dry.  Psychiatric: She has a normal mood and affect.       Assessment:     INR 4.4     Plan:     1.  INR above goal 2-3 due to interaction between warfarin and levothyroxine 2.  Hold Coumadin today 3.  Then start Coumadin 5mg  daily 4.  RTC in 3 weeks

## 2012-12-06 NOTE — Patient Instructions (Signed)
No Coumadin today Then start Coumadin 5mg  daily

## 2012-12-08 ENCOUNTER — Other Ambulatory Visit: Payer: Self-pay | Admitting: Geriatric Medicine

## 2012-12-08 MED ORDER — FENTANYL 12 MCG/HR TD PT72: 1.0000 | MEDICATED_PATCH | TRANSDERMAL | Status: DC

## 2012-12-14 ENCOUNTER — Encounter: Payer: Self-pay | Admitting: *Deleted

## 2012-12-15 ENCOUNTER — Ambulatory Visit (INDEPENDENT_AMBULATORY_CARE_PROVIDER_SITE_OTHER): Payer: Medicare Other | Admitting: Internal Medicine

## 2012-12-15 ENCOUNTER — Encounter: Payer: Self-pay | Admitting: Internal Medicine

## 2012-12-15 VITALS — BP 122/70 | HR 58 | Temp 97.1°F | Resp 14 | Wt 163.2 lb

## 2012-12-15 DIAGNOSIS — G309 Alzheimer's disease, unspecified: Secondary | ICD-10-CM

## 2012-12-15 DIAGNOSIS — R569 Unspecified convulsions: Secondary | ICD-10-CM

## 2012-12-15 DIAGNOSIS — C50919 Malignant neoplasm of unspecified site of unspecified female breast: Secondary | ICD-10-CM

## 2012-12-15 DIAGNOSIS — D649 Anemia, unspecified: Secondary | ICD-10-CM

## 2012-12-15 DIAGNOSIS — F0280 Dementia in other diseases classified elsewhere without behavioral disturbance: Secondary | ICD-10-CM

## 2012-12-15 DIAGNOSIS — E039 Hypothyroidism, unspecified: Secondary | ICD-10-CM

## 2012-12-15 DIAGNOSIS — I872 Venous insufficiency (chronic) (peripheral): Secondary | ICD-10-CM

## 2012-12-15 DIAGNOSIS — I10 Essential (primary) hypertension: Secondary | ICD-10-CM

## 2012-12-15 DIAGNOSIS — M79609 Pain in unspecified limb: Secondary | ICD-10-CM

## 2012-12-15 NOTE — Patient Instructions (Signed)
Continue current medications. 

## 2012-12-20 NOTE — Progress Notes (Signed)
Subjective:    Patient ID: Jennifer Brennan, female    DOB: 10-May-1924, 79 y.o.   MRN: [id number]  HPI HYPERTENSION: Controlled on current medications  HYPOTHYROIDISM : Controlled on current supplement.  ADENOCARCINOMA, LEFT BREAST: No evidence of relapse  SEIZURE DISORDER: No recent seizures. Appears to be controlled on current medication  Leg pain, unspecified laterality: Improved  Alzheimer's disease: Unchanged  Chronic venous insufficiency: Associated with chronic edema  Anemia, unspecified : Routine monitoring with lab   Current Outpatient Prescriptions on File Prior to Visit  Medication Sig Dispense Refill  . acetaminophen (TYLENOL) 500 MG tablet Take 1,000 mg by mouth every 6 (six) hours as needed. pain      . albuterol (PROVENTIL HFA;VENTOLIN HFA) 108 (90 BASE) MCG/ACT inhaler Inhale 2 puffs into the lungs every 6 (six) hours as needed. For shortness of breath.      . brimonidine (ALPHAGAN) 0.15 % ophthalmic solution Place 1 drop into both eyes 2 (two) times daily.      . Cholecalciferol 2000 UNITS TABS Take 1 tablet by mouth every morning.      . diphenhydrAMINE (BENADRYL) 25 MG tablet Take 12.5 mg by mouth 2 (two) times daily as needed. Pt took while on antibiotics      . docusate sodium (COLACE) 100 MG capsule Take 100 mg by mouth daily. Take one tablet twice a day as needed for  Constipation      . dorzolamide-timolol (COSOPT) 22.3-6.8 MG/ML ophthalmic solution Place 1 drop into both eyes 2 (two) times daily.        . fentaNYL (DURAGESIC - DOSED MCG/HR) 12 MCG/HR Place 1 patch (12.5 mcg total) onto the skin every 3 (three) days.  10 patch  0  . Fluticasone-Salmeterol (ADVAIR) 250-50 MCG/DOSE AEPB Inhale 1 puff into the lungs 2 (two) times daily as needed. For shortness of breath      . gabapentin (NEURONTIN) 300 MG capsule Two capsules at bed to help pains  60 capsule  5  . Gabapentin, PHN, 300 MG TABS Take 300 mg by mouth 2 (two) times daily. Take 2 tablets twice daily  to help with pains.      . Ipratropium-Albuterol (COMBIVENT RESPIMAT) 20-100 MCG/ACT AERS respimat Inhale 2 puffs into the lungs every 6 (six) hours. Take 2 puffs 4 times daily to help with breathing.      . latanoprost (XALATAN) 0.005 % ophthalmic solution Place 1 drop into both eyes at bedtime.        Marland Kitchen letrozole (FEMARA) 2.5 MG tablet Take 1 tablet (2.5 mg total) by mouth at bedtime.  90 tablet  4  . levETIRAcetam (KEPPRA) 500 MG tablet Take 1 tablet (500 mg total) by mouth 2 (two) times daily.  60 tablet  11  . levothyroxine (SYNTHROID, LEVOTHROID) 75 MCG tablet Take one tablet once a day for thyroid supplement  100 tablet  3  . Memantine HCl ER (NAMENDA XR) 28 MG CP24 Take 28 mg by mouth daily.  30 capsule  6  . mirabegron ER (MYRBETRIQ) 25 MG TB24 Take 25 mg by mouth daily. Take one tablet at bedtime for bladder.      . Multiple Vitamins-Minerals (MULTIVITAMIN GUMMIES ADULT) CHEW Chew by mouth. Chew two gummi es a day      . NIFEdipine (NIFEDICAL XL) 30 MG 24 hr tablet One daily to control BP  30 tablet  5  . omeprazole (PRILOSEC) 20 MG capsule Take 20 mg by mouth daily. Take 1 tablet daily  to reduce stomach acids.      . pilocarpine (PILOCAR) 1 % ophthalmic solution Place 1 drop into both eyes 3 (three) times daily. Instill 1 drop four times daily into both eyes for glaucoma.      . Probiotic Product (ALIGN) 4 MG CAPS Take by mouth. Take 1 capsule daily to help irritable colon.      . tiotropium (SPIRIVA) 18 MCG inhalation capsule Place 1 capsule (18 mcg total) into inhaler and inhale daily.  30 capsule  5  . warfarin (COUMADIN) 5 MG tablet Take 1 tablet (5 mg total) by mouth daily.  30 tablet  3   No current facility-administered medications on file prior to visit.     Review of Systems DATA OBTAINED: from patient, medical record, caregiver, family member GENERAL: Feels well   No fevers, fatigue, change in appetite or weight SKIN: No itch, rash or open wounds EYES: No eye pain,  dryness or itching  No change in vision. History of visual difficulties and bilateral, EARS: No earache, tinnitus. History of partial deafness. NOSE: No congestion, drainage or bleeding MOUTH/THROAT: No mouth or tooth pain  No sore throat No difficulty chewing or swallowing. Uses dentures. RESPIRATORY: No cough, wheezing, SOB CARDIAC: No chest pain, palpitations  No edema. History of hypertension. CHEST/BREASTS: No discomfort, discharge or lumps in breasts. History of left mastectomy. GI: No abdominal pain. History of constipation.  No heartburn or reflux  GU: No dysuria, frequency or urgency  No change in urine volume or character No nocturia or change in stream   MUSCULOSKELETAL: Generalized stiffness and some increased rigidity. Chronic back pain. Unsteady gait.Marland Kitchen  NEUROLOGIC: History of seizures, memory loss, and are cerebral hemorrhage. Some difficulty with balance. ENDOCRINE: History of thyroid problems. Some problems with temperature intolerances. History of hot flashes. PSYCHIATRIC: History of chronic anxiety and depression. No problems of dementia. Previous history of hallucinations, but none recently. Some evidence of mood swings.     Objective:   Physical Exam BP 122/70  Pulse 58  Temp(Src) 97.1 F (36.2 C)  Resp 14  Wt 163 lb 3.2 oz (74.027 kg)  BMI 28.92 kg/m2  SpO2 93% GENERAL APPEARANCE: No acute distress, appropriately groomed, normal body habitus. Alert, pleasant, conversant. EYES: Conjunctiva/lids clear. Pupils round, reactive. EOMs intact. Reduced visual acuity bilaterally. EARS: External exam WNL, canals clear, TM WNL. Partial hearing loss bilaterally. NECK: Supple, full ROM. No thyroid tenderness, enlargement or nodule LYMPHATICS: No head, neck or supraclavicular adenopathy BREASTS: Left mastectomy site well healed. No evidence of recurrent lesions. RESPIRATORY: Breathing is even, unlabored. Lung sounds are clear and full.  CARDIOVASCULAR: Heart RRR. No murmur or  extra heart sounds  ARTERIAL: Diminished dorsalis pedis pulses bilaterally. Diminished posterior tibial pulses bilaterally.  VENOUS: No varicosities. No venous stasis skin changes  EDEMA: 2+ edema in the lower legs and feet GASTROINTESTINAL: Abdomen is soft, non-tender, not distended w/ normal bowel sounds. No hepatic or splenic enlargement. No mass, ventral or inguinal hernia. MUSCULOSKELETAL: Moves all extremities with full ROM, strength and tone. Unstable gait. Riding in a wheelchair. NEUROLOGIC: Severely demented. Cranial nerves 2-12 grossly intact, speech clear, no tremor. Patella, brachial DTR 2+. PSYCHIATRIC: Mood and affect appropriate to situation    Office Visit on 12/06/2012  Component Date Value Range Status  . INR 12/06/2012 4.4   Final  Office Visit on 11/22/2012  Component Date Value Range Status  . INR 11/22/2012 4.2   Final       Assessment & Plan:  HYPERTENSION - Plan: BMP with eGFR  HYPOTHYROIDISM - Plan: TSH  ADENOCARCINOMA, LEFT BREAST: No evidence of relapse  SEIZURE DISORDER: Appears to be under control with present medication  Leg pain, unspecified laterality: Improved  Alzheimer's disease: Unchanged  Chronic venous insufficiency: Unchanged  Anemia, unspecified: Continue to monitor . Plan: CBC with Differential

## 2012-12-27 ENCOUNTER — Ambulatory Visit (INDEPENDENT_AMBULATORY_CARE_PROVIDER_SITE_OTHER): Payer: Medicare Other | Admitting: Pharmacotherapy

## 2012-12-27 ENCOUNTER — Encounter: Payer: Self-pay | Admitting: Pharmacotherapy

## 2012-12-27 VITALS — BP 130/78 | HR 75 | Temp 97.2°F | Resp 14 | Ht 63.0 in | Wt 162.0 lb

## 2012-12-27 DIAGNOSIS — Z7901 Long term (current) use of anticoagulants: Secondary | ICD-10-CM

## 2012-12-27 DIAGNOSIS — I82409 Acute embolism and thrombosis of unspecified deep veins of unspecified lower extremity: Secondary | ICD-10-CM

## 2012-12-27 DIAGNOSIS — I749 Embolism and thrombosis of unspecified artery: Secondary | ICD-10-CM

## 2012-12-27 LAB — POCT INR: INR: 2.7

## 2012-12-27 NOTE — Patient Instructions (Signed)
Continue Coumadin 5mg daily 

## 2012-12-27 NOTE — Progress Notes (Signed)
Subjective:     Patient ID: Jennifer Brennan, female   DOB: 09-28-1923, 79 y.o.   MRN: [id number]  HPI Last INR was high at 4.4 Coumadin was held, then reduced to 5mg  QD Denies unusual bleeding or bruising. May have missed 1 dose - not sure Consistent with vitamin K No CP, SOB, falls   Review of Systems  HENT: Negative for nosebleeds.   Respiratory: Negative for chest tightness and shortness of breath.   Cardiovascular: Negative for chest pain.  Gastrointestinal: Negative for blood in stool and anal bleeding.  Genitourinary: Negative for hematuria.       Objective:   Physical Exam  Constitutional: She is oriented to person, place, and time. She appears well-developed and well-nourished.  Eyes: Conjunctivae are normal.  Pulmonary/Chest: Effort normal.  Musculoskeletal:  In a wheelchair  Neurological: She is alert and oriented to person, place, and time.  Skin: Skin is warm and dry.  Psychiatric: She has a normal mood and affect.    BP:  130/78  HR:  75   Wt:  162 lb    Assessment:     INR 2.7     Plan:     1.  INR at goal 2-3 2.  Continue Coumadin 5mg  QD 3.  Return to clinic in 1 month

## 2012-12-31 NOTE — Progress Notes (Signed)
Patient ID: Jennifer Brennan, female   DOB: Apr 13, 1924, 79 y.o.   MRN: [id number] Dr. Merry Lofty, called last week to inform me that her vaginal ultrasound seemed normal. Uterus and ovaries were small.

## 2013-01-11 ENCOUNTER — Other Ambulatory Visit: Payer: Self-pay | Admitting: Geriatric Medicine

## 2013-01-11 MED ORDER — FENTANYL 12 MCG/HR TD PT72: 1.0000 | MEDICATED_PATCH | TRANSDERMAL | Status: DC

## 2013-01-24 ENCOUNTER — Encounter: Payer: Self-pay | Admitting: Pharmacotherapy

## 2013-01-24 ENCOUNTER — Ambulatory Visit (INDEPENDENT_AMBULATORY_CARE_PROVIDER_SITE_OTHER): Payer: Medicare Other | Admitting: Pharmacotherapy

## 2013-01-24 VITALS — BP 118/62 | HR 62 | Temp 97.4°F | Resp 14

## 2013-01-24 DIAGNOSIS — Z7901 Long term (current) use of anticoagulants: Secondary | ICD-10-CM

## 2013-01-24 NOTE — Patient Instructions (Addendum)
Continue Coumadin 5mg  once daily

## 2013-01-24 NOTE — Progress Notes (Signed)
Subjective:     Patient ID: Jennifer Brennan, female   DOB: [birth year], 79 y.o.   MRN: [id number]  HPI Last INR was OK at 2.7 Current Coumadin dose is 5mg  daily Missed 1 dose last week. Denies unusual bleeding or bruising. Denies CP, falls Consistent with vitamin K intake.   Review of Systems  HENT: Negative for nosebleeds.   Respiratory: Positive for shortness of breath.   Cardiovascular: Negative for chest pain and palpitations.  Gastrointestinal: Negative for blood in stool and anal bleeding.  Genitourinary: Negative for hematuria.       Objective:   Physical Exam  Constitutional: She is oriented to person, place, and time. She appears well-developed and well-nourished.  Eyes:  blind  Cardiovascular: Normal rate and regular rhythm.   Pulmonary/Chest: Effort normal and breath sounds normal.  Neurological: She is alert and oriented to person, place, and time.  Skin: Skin is warm and dry.  Psychiatric: She has a normal mood and affect.    BP:  118/62   HR:  62     Assessment:     INR 3.0     Plan:     1.  INR at goal 2-3 2.  Continue Coumadin 5mg  QD 3. RTC in 1 month

## 2013-02-03 ENCOUNTER — Telehealth: Payer: Self-pay | Admitting: Geriatric Medicine

## 2013-02-03 NOTE — Telephone Encounter (Signed)
Patient's daughter called and said her mother's hair is falling out. She recently started Namenda. Could this cause hair loss, or could it be the patient's thyroid? Should I schedule an appointment? Please advise thanks. She has an appointment with you on 03/22/13.

## 2013-02-07 NOTE — Telephone Encounter (Signed)
Namenda and thyroid disorder is unlikely. Schedule appt.

## 2013-02-08 NOTE — Telephone Encounter (Signed)
Patient aware and appointment scheduled.  

## 2013-02-09 ENCOUNTER — Encounter: Payer: Self-pay | Admitting: Internal Medicine

## 2013-02-09 ENCOUNTER — Ambulatory Visit (INDEPENDENT_AMBULATORY_CARE_PROVIDER_SITE_OTHER): Payer: Medicare Other | Admitting: Internal Medicine

## 2013-02-09 ENCOUNTER — Other Ambulatory Visit: Payer: Self-pay | Admitting: Geriatric Medicine

## 2013-02-09 VITALS — BP 126/74 | HR 63 | Temp 97.5°F | Resp 14

## 2013-02-09 DIAGNOSIS — M19049 Primary osteoarthritis, unspecified hand: Secondary | ICD-10-CM

## 2013-02-09 DIAGNOSIS — F0280 Dementia in other diseases classified elsewhere without behavioral disturbance: Secondary | ICD-10-CM

## 2013-02-09 DIAGNOSIS — L8990 Pressure ulcer of unspecified site, unspecified stage: Secondary | ICD-10-CM

## 2013-02-09 DIAGNOSIS — R1030 Lower abdominal pain, unspecified: Secondary | ICD-10-CM

## 2013-02-09 DIAGNOSIS — M545 Low back pain: Secondary | ICD-10-CM

## 2013-02-09 DIAGNOSIS — R109 Unspecified abdominal pain: Secondary | ICD-10-CM

## 2013-02-09 DIAGNOSIS — L8992 Pressure ulcer of unspecified site, stage 2: Secondary | ICD-10-CM

## 2013-02-09 DIAGNOSIS — I10 Essential (primary) hypertension: Secondary | ICD-10-CM

## 2013-02-09 DIAGNOSIS — E039 Hypothyroidism, unspecified: Secondary | ICD-10-CM

## 2013-02-09 DIAGNOSIS — L659 Nonscarring hair loss, unspecified: Secondary | ICD-10-CM

## 2013-02-09 DIAGNOSIS — K219 Gastro-esophageal reflux disease without esophagitis: Secondary | ICD-10-CM

## 2013-02-09 DIAGNOSIS — R569 Unspecified convulsions: Secondary | ICD-10-CM

## 2013-02-09 MED ORDER — FENTANYL 12 MCG/HR TD PT72: 1.0000 | MEDICATED_PATCH | TRANSDERMAL | Status: DC

## 2013-02-09 NOTE — Patient Instructions (Signed)
Continue current medications. 

## 2013-02-09 NOTE — Progress Notes (Signed)
Subjective:    Patient ID: Jennifer Brennan, female    DOB: May 09, 1924, 79 y.o.   MRN: [id number]  HPI Losing hair. Wants to know if recent problems with thyroid could be the cause.  New problem of right groin hurting. Painful to walk. Daughter wonders if there is infection. History of hip  Replacement in 2008.  She is seeing more things/ people that are not there.  She is depressed abut her eyesight.  Cost of Myrbetrig is $249 after insurance. Needs help: ? Samples, diffeerent drug .  On 02/07/13 she was dizzy and not feeling well . BP 167/79. Given O2. BP then 155/71 pulse 62. Began to feel better. Daughter wants to know if she should be on more BP medication.    Current Outpatient Prescriptions on File Prior to Visit  Medication Sig Dispense Refill  . acetaminophen (TYLENOL) 500 MG tablet Take 1,000 mg by mouth every 6 (six) hours as needed. pain      . brimonidine (ALPHAGAN) 0.15 % ophthalmic solution Place 1 drop into both eyes 2 (two) times daily.      . Cholecalciferol 2000 UNITS TABS Take 1 tablet by mouth every morning.      . Cranberry-Vitamin C-Vitamin E 4200-20-3 MG-MG-UNIT CAPS Take by mouth. Take one tablet once a day      . diphenhydrAMINE (BENADRYL) 25 MG tablet Take 12.5 mg by mouth 2 (two) times daily as needed. Pt took while on antibiotics      . docusate sodium (COLACE) 100 MG capsule Take 100 mg by mouth daily. Take one tablet twice a day as needed for  Constipation      . dorzolamide-timolol (COSOPT) 22.3-6.8 MG/ML ophthalmic solution Place 1 drop into both eyes 2 (two) times daily.        . fentaNYL (DURAGESIC - DOSED MCG/HR) 12 MCG/HR Place 1 patch (12.5 mcg total) onto the skin every 3 (three) days.  10 patch  0  . Fluticasone-Salmeterol (ADVAIR) 250-50 MCG/DOSE AEPB Inhale 1 puff into the lungs 2 (two) times daily as needed. For shortness of breath      . gabapentin (NEURONTIN) 300 MG capsule Two capsules at bed to help pains  60 capsule  5  .  Ipratropium-Albuterol (COMBIVENT RESPIMAT) 20-100 MCG/ACT AERS respimat Inhale 2 puffs into the lungs every 6 (six) hours. Take 2 puffs 4 times daily to help with breathing.      . latanoprost (XALATAN) 0.005 % ophthalmic solution Place 1 drop into both eyes at bedtime.        Marland Kitchen letrozole (FEMARA) 2.5 MG tablet Take 1 tablet (2.5 mg total) by mouth at bedtime.  90 tablet  4  . levETIRAcetam (KEPPRA) 500 MG tablet Take 1 tablet (500 mg total) by mouth 2 (two) times daily.  60 tablet  11  . levothyroxine (SYNTHROID, LEVOTHROID) 75 MCG tablet Take one tablet once a day for thyroid supplement  100 tablet  3  . Memantine HCl ER (NAMENDA XR) 28 MG CP24 Take 28 mg by mouth daily.  30 capsule  6  . mirabegron ER (MYRBETRIQ) 25 MG TB24 Take 25 mg by mouth daily. Take one tablet at bedtime for bladder.      . Multiple Vitamins-Minerals (MULTIVITAMIN GUMMIES ADULT) CHEW Chew by mouth. Chew two gummi es a day      . NIFEdipine (NIFEDICAL XL) 30 MG 24 hr tablet One daily to control BP  30 tablet  5  . omeprazole (PRILOSEC) 20 MG capsule Take  20 mg by mouth daily. Take 1 tablet daily to reduce stomach acids.      . pilocarpine (PILOCAR) 1 % ophthalmic solution Place 1 drop into both eyes 3 (three) times daily. Instill 1 drop four times daily into both eyes for glaucoma.      . Probiotic Product (ALIGN) 4 MG CAPS Take by mouth. Take 1 capsule daily to help irritable colon. Only while on antibiotics.      Marland Kitchen tiotropium (SPIRIVA) 18 MCG inhalation capsule Place 1 capsule (18 mcg total) into inhaler and inhale daily.  30 capsule  5  . warfarin (COUMADIN) 5 MG tablet Take 1 tablet (5 mg total) by mouth daily.  30 tablet  3  . albuterol (PROVENTIL HFA;VENTOLIN HFA) 108 (90 BASE) MCG/ACT inhaler Inhale 2 puffs into the lungs every 6 (six) hours as needed. For shortness of breath.       No current facility-administered medications on file prior to visit.    Review of Systems  Constitutional: Positive for fatigue.  Negative for fever, diaphoresis, activity change, appetite change and unexpected weight change.  HENT: Positive for hearing loss. Negative for ear pain, congestion, neck pain and neck stiffness.   Eyes: Positive for visual disturbance.       Prescription lenses  Respiratory: Negative for apnea, cough, choking, chest tightness, shortness of breath and wheezing.   Cardiovascular: Positive for leg swelling. Negative for chest pain and palpitations.  Gastrointestinal: Negative for abdominal pain and abdominal distention.  Endocrine: Negative for cold intolerance, polydipsia, polyphagia and polyuria.  Genitourinary: Positive for frequency. Negative for decreased urine volume, difficulty urinating and pelvic pain.       Urinary incontinence.  Musculoskeletal: Positive for myalgias, arthralgias and gait problem. Negative for back pain and joint swelling.  Skin:       Erythema of the left lower leg. Associated with venous insufficiency and chronic edema. The area is not warm. It is mildly tender to touch. Complains of hair loss.  Allergic/Immunologic: Negative.   Neurological: Positive for weakness. Negative for dizziness, tremors, seizures, syncope, facial asymmetry, speech difficulty, light-headedness, numbness and headaches.  Hematological: Negative.   Psychiatric/Behavioral: Positive for behavioral problems, confusion, sleep disturbance, decreased concentration and agitation. Negative for suicidal ideas and hallucinations. The patient is nervous/anxious. The patient is not hyperactive.        Objective:BP 126/74  Pulse 63  Temp(Src) 97.5 F (36.4 C) (Oral)  Resp 14    Physical Exam  Constitutional: No distress.  HENT:  Head: Normocephalic and atraumatic.  Diminished hearing bilaterally  Eyes: Conjunctivae and EOM are normal. Left eye exhibits no discharge.  Neck: Normal range of motion. Neck supple. No JVD present. No tracheal deviation present. No thyromegaly present.  Cardiovascular:  Normal rate, regular rhythm and normal heart sounds.  Exam reveals no gallop and no friction rub.   No murmur heard. Pulmonary/Chest: Effort normal and breath sounds normal. No respiratory distress. She has no wheezes. She has no rales.  Abdominal: Bowel sounds are normal. She exhibits no distension. There is no tenderness.  Musculoskeletal: Normal range of motion. She exhibits edema and tenderness.  Lymphadenopathy:    She has no cervical adenopathy.  Neurological: She is alert. No cranial nerve deficit. Coordination normal.  Skin: Skin is warm and dry. She is not diaphoretic. There is erythema. No pallor.  Despite complaints of hair loss, I can find not bald or thin areas. Tugging on hair does not release an inordinate number of hair strands.  Psychiatric:  Significant dementia. Althoughcalm at our office today, the family describes her episodes of agitation at home.     Office Visit on 01/24/2013  Component Date Value Range Status  . INR 01/24/2013 3.0   Final  Office Visit on 12/27/2012  Component Date Value Range Status  . INR 12/27/2012 2.7   Final  Office Visit on 12/06/2012  Component Date Value Range Status  . INR 12/06/2012 4.4   Final  Office Visit on 11/22/2012  Component Date Value Range Status  . INR 11/22/2012 4.2   Final  Appointment on 11/12/2012  Component Date Value Range Status  . WBC 11/12/2012 6.1  3.9 - 10.3 10e3/uL Final  . NEUT# 11/12/2012 2.9  1.5 - 6.5 10e3/uL Final  . HGB 11/12/2012 12.3  11.6 - 15.9 g/dL Final  . HCT 45/40/9811 37.7  34.8 - 46.6 % Final  . Platelets 11/12/2012 158  145 - 400 10e3/uL Final  . MCV 11/12/2012 78.8* 79.5 - 101.0 fL Final  . MCH 11/12/2012 25.8  25.1 - 34.0 pg Final  . MCHC 11/12/2012 32.7  31.5 - 36.0 g/dL Final  . RBC 91/47/8295 4.79  3.70 - 5.45 10e6/uL Final  . RDW 11/12/2012 19.9* 11.2 - 14.5 % Final  . lymph# 11/12/2012 2.6  0.9 - 3.3 10e3/uL Final  . MONO# 11/12/2012 0.4  0.1 - 0.9 10e3/uL Final  . Eosinophils  Absolute 11/12/2012 0.1  0.0 - 0.5 10e3/uL Final  . Basophils Absolute 11/12/2012 0.1  0.0 - 0.1 10e3/uL Final  . NEUT% 11/12/2012 48.2  38.4 - 76.8 % Final  . LYMPH% 11/12/2012 41.8  14.0 - 49.7 % Final  . MONO% 11/12/2012 6.9  0.0 - 14.0 % Final  . EOS% 11/12/2012 2.0  0.0 - 7.0 % Final  . BASO% 11/12/2012 1.1  0.0 - 2.0 % Final  . Sodium 11/12/2012 143  136 - 145 mEq/L Final  . Potassium 11/12/2012 3.4* 3.5 - 5.1 mEq/L Final  . Chloride 11/12/2012 106  98 - 107 mEq/L Final  . CO2 11/12/2012 28  22 - 29 mEq/L Final  . Glucose 11/12/2012 86  70 - 99 mg/dl Final  . BUN 62/13/0865 15.7  7.0 - 26.0 mg/dL Final  . Creatinine 78/46/9629 1.4* 0.6 - 1.1 mg/dL Final  . Total Bilirubin 11/12/2012 0.48  0.20 - 1.20 mg/dL Final  . Alkaline Phosphatase 11/12/2012 63  40 - 150 U/L Final  . AST 11/12/2012 28  5 - 34 U/L Final  . ALT 11/12/2012 17  0 - 55 U/L Final  . Total Protein 11/12/2012 7.6  6.4 - 8.3 g/dL Final  . Albumin 52/84/1324 3.5  3.5 - 5.0 g/dL Final  . Calcium 40/03/2724 9.1  8.4 - 10.4 mg/dL Final  . Vit D, 36-UYQIHKV 11/12/2012 45  30 - 89 ng/mL Final   Comment: This assay accurately quantifies Vitamin D, which is the sum of the25-Hydroxy forms of Vitamin D2 and D3.  Studies have shown that theoptimum concentration of 25-Hydroxy Vitamin D is 30 ng/mL or higher. Concentrations of Vitamin D between 20 and 29 ng/mL                           are considered tobe insufficient and concentrations less than 20 ng/mL are consideredto be deficient for Vitamin D.        Assessment & Plan:  Hair loss: Not able to confirm. Observe. Thyroid will be rechecked.  HYPOTHYROIDISM - Plan: TSH  HYPERTENSION: Controlled  OSTEOARTHRITIS, HANDS, BILATERAL: Chronic. Mild to moderate discomfort.  BACK PAIN, LUMBAR, CHRONIC: Chronic. Mild discomfort  SEIZURE DISORDER: No recent seizures  GERD (gastroesophageal reflux disease): Asymptomatic  Alzheimer's disease: Progressive memory loss, but no  significant progression has been seen over the last few visits.  Pressure ulcer stage II: Healing  Pain in the groin - Plan: Ambulatory referral to Orthopedic Surgery

## 2013-02-10 LAB — TSH: TSH: 9.29 u[IU]/mL — ABNORMAL HIGH (ref 0.450–4.500)

## 2013-02-28 ENCOUNTER — Ambulatory Visit (INDEPENDENT_AMBULATORY_CARE_PROVIDER_SITE_OTHER): Payer: Medicare Other | Admitting: Pharmacotherapy

## 2013-02-28 VITALS — BP 142/80 | HR 68 | Temp 97.4°F | Resp 16

## 2013-02-28 DIAGNOSIS — Z7901 Long term (current) use of anticoagulants: Secondary | ICD-10-CM

## 2013-02-28 DIAGNOSIS — I82409 Acute embolism and thrombosis of unspecified deep veins of unspecified lower extremity: Secondary | ICD-10-CM

## 2013-02-28 DIAGNOSIS — F0280 Dementia in other diseases classified elsewhere without behavioral disturbance: Secondary | ICD-10-CM

## 2013-02-28 LAB — POCT INR: INR: 3.5

## 2013-02-28 MED ORDER — MEMANTINE HCL 10 MG PO TABS: 10.0000 mg | ORAL_TABLET | Freq: Two times a day (BID) | ORAL | Status: DC

## 2013-02-28 MED ORDER — WARFARIN SODIUM 5 MG PO TABS: ORAL_TABLET | ORAL | Status: DC

## 2013-02-28 NOTE — Progress Notes (Signed)
Subjective:     Patient ID: Jennifer Brennan, female   DOB: 1923/10/07, 79 y.o.   MRN: [id number]  HPI Last INR was 3.0. She ran out of her Namenda. Current Coumadin dose is 5mg  daily. Denies missed doses. Denies unusual bleeding or bruising. Denies CP,falls. Consistent with vitamin K intake.  Review of Systems  HENT: Negative for nosebleeds.   Respiratory: Negative for shortness of breath.   Cardiovascular: Negative for chest pain.  Gastrointestinal: Negative for blood in stool and anal bleeding.  Genitourinary: Negative for hematuria.       Objective:   Physical Exam  Constitutional: She is oriented to person, place, and time. She appears well-developed and well-nourished.  Eyes:  Legally blind  Cardiovascular: Normal rate and normal heart sounds.   Pulmonary/Chest: Effort normal and breath sounds normal.  Neurological: She is alert and oriented to person, place, and time.  Skin: Skin is warm and dry.  Psychiatric: She has a normal mood and affect.    BP:  142/80    HR:  68     Assessment:     INR 3.5     Plan:     1.  Decrease Coumadin 5mg  daily except 2.5mg  Mondays. 2.  RTC in 2 weeks.

## 2013-02-28 NOTE — Patient Instructions (Signed)
Decrease Coumadin 5mg  daily except 2.5mg  on Mondays

## 2013-03-01 ENCOUNTER — Telehealth: Payer: Self-pay | Admitting: *Deleted
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
## 2013-03-04 ENCOUNTER — Ambulatory Visit (INDEPENDENT_AMBULATORY_CARE_PROVIDER_SITE_OTHER): Payer: Medicare Other | Admitting: Internal Medicine

## 2013-03-04 ENCOUNTER — Encounter: Payer: Self-pay | Admitting: Internal Medicine

## 2013-03-04 VITALS — BP 138/70 | HR 63 | Temp 97.4°F | Resp 16

## 2013-03-04 DIAGNOSIS — T148XXA Other injury of unspecified body region, initial encounter: Secondary | ICD-10-CM

## 2013-03-04 DIAGNOSIS — IMO0002 Reserved for concepts with insufficient information to code with codable children: Secondary | ICD-10-CM

## 2013-03-04 NOTE — Patient Instructions (Addendum)
Stay off your feet for a few days. Elevate your feet. Continue to use neosporin cream on the open areas and leave them open to air while resting Apply nonadherent dressings to the area if you are up and about Let us know if pain or drainage should get worse or if a fever develops.

## 2013-03-04 NOTE — Progress Notes (Signed)
Patient ID: Jennifer Brennan, female   DOB: [birth year], 79 y.o.   MRN: [id number] Location:  [facility]   Allergies  Allergen Reactions  . Bactrim [Sulfamethoxazole W-Trimethoprim] Other (See Comments)    MD stopped due to kidney failure Also causes lips to swell  . Furosemide Other (See Comments)    MD stopped due to kidney failure  . Percocet [Oxycodone-Acetaminophen] Other (See Comments)    Causes SEIZURES  . Valsartan Other (See Comments)    MD stopped due to kidney failure  . Doxycycline Swelling    Causes lips to swell, wheezing  . Penicillins Hives  . Morphine Sulfate Itching and Rash  . Sertraline Hcl Rash    Chief Complaint  Patient presents with  . Acute Visit    both legs, more left issues using neosporin cream for pain and ointment  at other times.    HPI: Patient is a 79 y.o. black female with h/o chronic venous stasis and prior severe MRSA wound on her left shin about a year ago seen in the office today for acute visit due to abrasions on anterior shins from bumping against the bed.  She has had throbbing pain in her legs since this happened yesterday.  The wounds had been draining significantly yesterday.  Her daughter has been applying neosporin ointment to the open areas and neosporin cream to both shins.  Yesterday, she elevated them and left them open to air, not using the ted hose.  She had been using zinc on any small blisters that came up but we advised against that.  Review of Systems:  Review of Systems  Constitutional: Negative for fever and chills.  Cardiovascular: Positive for leg swelling.       Slightly more edema left than right, small amount of warmth vs. Remainder of legs  Skin:       2 skin tears anterior shins  Neurological: Positive for tingling and weakness.  Psychiatric/Behavioral: Positive for memory loss.     Past Medical History  Diagnosis Date  . Hypertension   . Hypothyroidism   . Seizure  disorder   . History of cerebrovascular accident   . Severe stage glaucoma     legally blind  . Depression with anxiety   . OAB (overactive bladder)   . ADENOCARCINOMA, LEFT BREAST 04/11/2010    s/p L mastectomy, on femara x 74yr  . BACK PAIN, LUMBAR, CHRONIC   . DVT 05/2007    RLE following R THR - chronic coumadin, chronic RLE pain  . GLAUCOMA   . OSTEOARTHRITIS, HANDS, BILATERAL   . OSTEOPENIA   . Retinal ischemia   . Sciatica of right side     chronic RLE pain, multifactorial - MRI T/L spine 02/2011  . VITAMIN D DEFICIENCY dx 08/2008  . Venous ulcer of right lower extremity with varicose veins   . Dementia   . Vulvar abscess   . Unspecified hypothyroidism   . Unspecified vitamin D deficiency   . Unspecified urinary incontinence   . Renal disorder   . Varicose veins of lower extremity   . Varicose veins of lower extremities with ulcer   . Unspecified disorder of kidney and ureter   . Stevens-Johnson syndrome   . Sebaceous cyst   . Osteoarthrosis, unspecified whether generalized or localized, unspecified site   . Disorder of bone and cartilage, unspecified   . Contusion of lower leg     Past Surgical History  Procedure Laterality  Date  . Total abdominal hysterectomy    . Total hip arthroplasty  10/2006    right hip  . Refractive surgery      B/L  . Breast surgery  03/2010    breast biopsy, L mastectomy   . Tonsillectomy      Social History:   reports that she quit smoking about 33 years ago. Her smoking use included Cigarettes. She smoked 0.00 packs per day for 45 years. She has never used smokeless tobacco. She reports that she does not drink alcohol or use illicit drugs.  Family History  Problem Relation Age of Onset  . Ovarian cancer Mother   . Cancer Mother   . Deep vein thrombosis Mother   . Heart disease Mother   . Arthritis Other     grandparents  . Lung cancer Other   . Heart disease Other     parent  . Cancer Father     BRAIN TUMOR  . Cancer  Brother   . Hyperlipidemia Brother   . Hypertension Brother   . Stroke Brother   . Arthritis Brother   . Sickle cell anemia Daughter   . Heart disease Daughter   . Sickle cell anemia Daughter     Medications: Patient's Medications  New Prescriptions   No medications on file  Previous Medications   ACETAMINOPHEN (TYLENOL) 500 MG TABLET    Take 1,000 mg by mouth every 6 (six) hours as needed. pain   ALBUTEROL (PROVENTIL HFA;VENTOLIN HFA) 108 (90 BASE) MCG/ACT INHALER    Inhale 2 puffs into the lungs every 6 (six) hours as needed. For shortness of breath.   BRIMONIDINE (ALPHAGAN) 0.15 % OPHTHALMIC SOLUTION    Place 1 drop into both eyes 2 (two) times daily.   CHOLECALCIFEROL 2000 UNITS TABS    Take 1 tablet by mouth every morning.   CRANBERRY-VITAMIN C-VITAMIN E 4200-20-3 MG-MG-UNIT CAPS    Take by mouth. Take one tablet once a day   DIPHENHYDRAMINE (BENADRYL) 25 MG TABLET    Take 12.5 mg by mouth 2 (two) times daily as needed. Pt took while on antibiotics   DOCUSATE SODIUM (COLACE) 100 MG CAPSULE    Take 100 mg by mouth daily. Take one tablet twice a day as needed for  Constipation   DORZOLAMIDE-TIMOLOL (COSOPT) 22.3-6.8 MG/ML OPHTHALMIC SOLUTION    Place 1 drop into both eyes 2 (two) times daily.     FENTANYL (DURAGESIC - DOSED MCG/HR) 12 MCG/HR    Place 1 patch (12.5 mcg total) onto the skin every 3 (three) days.   FLUTICASONE-SALMETEROL (ADVAIR) 250-50 MCG/DOSE AEPB    Inhale 1 puff into the lungs 2 (two) times daily as needed. For shortness of breath   GABAPENTIN (NEURONTIN) 300 MG CAPSULE    Two capsules at bed to help pains   IPRATROPIUM-ALBUTEROL (COMBIVENT RESPIMAT) 20-100 MCG/ACT AERS RESPIMAT    Inhale 2 puffs into the lungs every 6 (six) hours. Take 2 puffs 4 times daily to help with breathing.   LATANOPROST (XALATAN) 0.005 % OPHTHALMIC SOLUTION    Place 1 drop into both eyes at bedtime.     LETROZOLE (FEMARA) 2.5 MG TABLET    Take 1 tablet (2.5 mg total) by mouth at bedtime.    LEVETIRACETAM (KEPPRA) 500 MG TABLET    Take 1 tablet (500 mg total) by mouth 2 (two) times daily.   LEVOTHYROXINE (SYNTHROID, LEVOTHROID) 75 MCG TABLET    Take one tablet once a day for thyroid supplement  MEMANTINE (NAMENDA) 10 MG TABLET    Take 1 tablet (10 mg total) by mouth 2 (two) times daily.   MIRABEGRON ER (MYRBETRIQ) 25 MG TB24    Take 25 mg by mouth daily. Take one tablet at bedtime for bladder.   MULTIPLE VITAMINS-MINERALS (MULTIVITAMIN GUMMIES ADULT) CHEW    Chew by mouth. Chew two gummi es a day   NIFEDIPINE (NIFEDICAL XL) 30 MG 24 HR TABLET    One daily to control BP   OMEPRAZOLE (PRILOSEC) 20 MG CAPSULE    Take 20 mg by mouth daily. Take 1 tablet daily to reduce stomach acids.   PILOCARPINE (PILOCAR) 1 % OPHTHALMIC SOLUTION    Place 1 drop into both eyes 3 (three) times daily. Instill 1 drop four times daily into both eyes for glaucoma.   PROBIOTIC PRODUCT (ALIGN) 4 MG CAPS    Take by mouth. Take 1 capsule daily to help irritable colon. Only while on antibiotics.   TIOTROPIUM (SPIRIVA) 18 MCG INHALATION CAPSULE    Place 1 capsule (18 mcg total) into inhaler and inhale daily.   WARFARIN (COUMADIN) 5 MG TABLET    Take 1 tablet daily except 1/2 tablet on Mondays  Modified Medications   No medications on file  Discontinued Medications   No medications on file     Physical Exam: Filed Vitals:   03/04/13 1037  BP: 138/70  Pulse: 63  Temp: 97.4 F (36.3 C)  TempSrc: Oral  Resp: 16  SpO2: 95%   Physical Exam  Constitutional: No distress.  Cardiovascular: Normal rate, regular rhythm and normal heart sounds.   Pulmonary/Chest: Effort normal and breath sounds normal. No respiratory distress.  Musculoskeletal: She exhibits edema.  Neurological: She is alert.  Skin: Skin is warm and dry. There is erythema.  Bilateral LE with chronic venous stasis brown discoloration, small amount of erythema of bilateral anterior shins with excoriations present of both with epidermis missing,  minimal serosanguinous drainage from both, no tenderness at present per pt    Labs reviewed: Basic Metabolic Panel:  Recent Labs  32/44/01 1310 09/25/12 1444 10/13/12 1737 11/12/12 1357 02/09/13 1620  NA 141 140  --  143  --   K 4.1 4.9  --  3.4*  --   CL 105 102  --  106  --   CO2 28 26  --  28  --   GLUCOSE 102* 81  --  86  --   BUN 15.0 12  --  15.7  --   CREATININE 1.2* 1.22*  --  1.4*  --   CALCIUM 9.8 9.2  --  9.1  --   TSH  --   --  140.900*  --  9.290*   Liver Function Tests:  Recent Labs  04/27/12 1310 11/12/12 1357  AST 20 28  ALT 17 17  ALKPHOS 66 63  BILITOT 0.44 0.48  PROT 7.3 7.6  ALBUMIN 3.5 3.5   No results found for this basename: LIPASE, AMYLASE,  in the last 8760 hours No results found for this basename: AMMONIA,  in the last 8760 hours CBC:  Recent Labs  04/27/12 1310 09/25/12 1444 11/12/12 1357  WBC 7.6 6.7 6.1  NEUTROABS 4.0 2.6 2.9  HGB 11.9 14.4 12.3  HCT 36.1 41.6 37.7  MCV 81.9 73.8* 78.8*  PLT 274 171 158   Lab Results  Component Value Date   HGBA1C 5.7 08/06/2007   Assessment/Plan 1. Superficial abrasion -will continue with neosporin cream on bilateral superficial  abrasions -elevate legs and keep open to air for a few days (except at night when has risk of reopening accidentally)  -cover with telfa non-adherent dressing right now and wrap with kerlix -let us know if increased erythema, warmth, drainage, swelling, pain or fever -may put ted hose back on when abrasions are no longer open

## 2013-03-14 ENCOUNTER — Encounter: Payer: Self-pay | Admitting: Pharmacotherapy

## 2013-03-14 ENCOUNTER — Ambulatory Visit (INDEPENDENT_AMBULATORY_CARE_PROVIDER_SITE_OTHER): Payer: Medicare Other | Admitting: Pharmacotherapy

## 2013-03-14 ENCOUNTER — Other Ambulatory Visit: Payer: Self-pay | Admitting: *Deleted

## 2013-03-14 VITALS — BP 130/78 | HR 78 | Temp 96.9°F | Wt 153.0 lb

## 2013-03-14 DIAGNOSIS — Z7901 Long term (current) use of anticoagulants: Secondary | ICD-10-CM

## 2013-03-14 DIAGNOSIS — I82409 Acute embolism and thrombosis of unspecified deep veins of unspecified lower extremity: Secondary | ICD-10-CM

## 2013-03-14 LAB — POCT INR: INR: 2.9

## 2013-03-14 MED ORDER — FENTANYL 12 MCG/HR TD PT72: 1.0000 | MEDICATED_PATCH | TRANSDERMAL | Status: DC

## 2013-03-14 NOTE — Patient Instructions (Signed)
Continue Coumadin 5mg  daily except 2.5mg  on Mondays

## 2013-03-14 NOTE — Progress Notes (Signed)
Pt was seen this am with Edison Pace.  Pt's legs have chronic erythema and brawny changes, small open areas bilaterally. Advised to cover open areas with nonadhesive dressings and wrap with ace bandages.  No signs of active infection.

## 2013-03-14 NOTE — Progress Notes (Signed)
Subjective:     Patient ID: Jennifer Brennan, female   DOB: 03/02/1924, 79 y.o.   MRN: [id number]  HPI Current Coumadin dose is 5mg  daily except 2.5mg  on Mondays.  Her INR last visit was high at 3.5.  Goal INR is 2-3. She denies missed doses. She denies unusual bruising.  Some bleeding and weeping from legs.  Saw Dr. Renato Gails 9/12 for this problem.  She is cleaning and dressing the area daily.  Daughter feels legs look better. Denies CP, SOB, falls. She is consistent with vitamin K foods.   Review of Systems  HENT: Negative for nosebleeds.   Respiratory: Negative for shortness of breath.   Cardiovascular: Negative for chest pain.  Gastrointestinal: Negative for blood in stool and anal bleeding.  Genitourinary: Negative for hematuria.  Skin: Positive for wound.       Objective:   Physical Exam  Constitutional: She is oriented to person, place, and time. She appears well-developed and well-nourished.  Eyes:  She is legally blind  Cardiovascular: Normal rate and regular rhythm.   Pulmonary/Chest: Effort normal and breath sounds normal.  Neurological: She is alert and oriented to person, place, and time.  Skin:  Lower legs with healing wound.  Skin dry today.  Is discolored.  Psychiatric: She has a normal mood and affect. Her behavior is normal.    BP:  130/78   HR:  78  Wt:  153 lb     Assessment:     INR 2.9     Plan:     1.  INR at goal 2-3 2.  Continue Coumadin 5mg  QD except 2.5mg  Mondays. 3.  Dr. Renato Gails to assess legs again today. 4.  RTC 1 month.  5.  Ok to wrap legs with ACE bandage.  Cover open areas with non-stick dressing first.

## 2013-03-22 ENCOUNTER — Ambulatory Visit (INDEPENDENT_AMBULATORY_CARE_PROVIDER_SITE_OTHER): Payer: Medicare Other | Admitting: Internal Medicine

## 2013-03-22 ENCOUNTER — Encounter: Payer: Self-pay | Admitting: Internal Medicine

## 2013-03-22 VITALS — BP 128/84 | HR 66 | Temp 97.5°F | Wt 165.0 lb

## 2013-03-22 DIAGNOSIS — I872 Venous insufficiency (chronic) (peripheral): Secondary | ICD-10-CM

## 2013-03-22 DIAGNOSIS — K219 Gastro-esophageal reflux disease without esophagitis: Secondary | ICD-10-CM

## 2013-03-22 DIAGNOSIS — R531 Weakness: Secondary | ICD-10-CM

## 2013-03-22 DIAGNOSIS — R5381 Other malaise: Secondary | ICD-10-CM

## 2013-03-22 DIAGNOSIS — R1030 Lower abdominal pain, unspecified: Secondary | ICD-10-CM

## 2013-03-22 DIAGNOSIS — R109 Unspecified abdominal pain: Secondary | ICD-10-CM

## 2013-03-22 DIAGNOSIS — R32 Unspecified urinary incontinence: Secondary | ICD-10-CM

## 2013-03-22 DIAGNOSIS — F0280 Dementia in other diseases classified elsewhere without behavioral disturbance: Secondary | ICD-10-CM

## 2013-03-22 DIAGNOSIS — I10 Essential (primary) hypertension: Secondary | ICD-10-CM

## 2013-03-22 MED ORDER — TIOTROPIUM BROMIDE MONOHYDRATE 18 MCG IN CAPS: 18.0000 ug | ORAL_CAPSULE | Freq: Every day | RESPIRATORY_TRACT | Status: DC

## 2013-03-22 MED ORDER — MIRABEGRON ER 25 MG PO TB24: 25.0000 mg | ORAL_TABLET | Freq: Every day | ORAL | Status: DC

## 2013-03-22 NOTE — Patient Instructions (Signed)
Continue current medication.

## 2013-03-24 NOTE — Progress Notes (Signed)
Subjective:    Patient ID: Jennifer Brennan, female    DOB: 1923-07-14, 79 y.o.   MRN: [id number]  HPI Daughter brings a letter with multiple questions:  1. Saw ortho 02/28/13. Found nothing wrong with hip. Could her problem be with her kidneys making the right groin area hurt.  2. How often do kidneys need to be checked?  3. Namenda seems to be helping memory. Can the dose be increased?  4. Can she come off gabapentin?  5.Can she stop oxygen?  6. What is causing left foot pain. Myoflex seems to help.  7.What are the expiration dates on her inhalers.  8. Dr. Eulah Pont is retiring.Her new ophth is Dr.Tanner at [facility].  9. Having pain down the left arm today. No diphoresis, palpitations, or dizziness.  Incontinence of urine: unchanged. She is on mirabegron ER (MYRBETRIQ) 25 MG TB24 tablet  Alzheimer's disease: moderate. Taking Namenda 20 mg qd   Pain in the groin   - Plan: CT Abdomen Pelvis Wo Contrast  Chronic venous insufficiency;unchanged  GERD (gastroesophageal reflux disease)  Weakness generalized:improving  HYPERTENSION: controlled      Current Outpatient Prescriptions on File Prior to Visit  Medication Sig Dispense Refill  . acetaminophen (TYLENOL) 500 MG tablet Take 1,000 mg by mouth every 6 (six) hours as needed. pain      . albuterol (PROVENTIL HFA;VENTOLIN HFA) 108 (90 BASE) MCG/ACT inhaler Inhale 2 puffs into the lungs every 6 (six) hours as needed. For shortness of breath.      . brimonidine (ALPHAGAN) 0.15 % ophthalmic solution Place 1 drop into both eyes 2 (two) times daily.      . Cholecalciferol 2000 UNITS TABS Take 1 tablet by mouth every morning.      . Cranberry-Vitamin C-Vitamin E 4200-20-3 MG-MG-UNIT CAPS Take by mouth. Take one tablet once a day      . diphenhydrAMINE (BENADRYL) 25 MG tablet Take 12.5 mg by mouth 2 (two) times daily as needed. Pt took while on antibiotics      . docusate sodium (COLACE) 100 MG capsule Take 100 mg by mouth daily.  Take one tablet twice a day as needed for  Constipation      . dorzolamide-timolol (COSOPT) 22.3-6.8 MG/ML ophthalmic solution Place 1 drop into both eyes 2 (two) times daily.        . fentaNYL (DURAGESIC - DOSED MCG/HR) 12 MCG/HR Place 1 patch (12.5 mcg total) onto the skin every 3 (three) days.  10 patch  0  . Fluticasone-Salmeterol (ADVAIR) 250-50 MCG/DOSE AEPB Inhale 1 puff into the lungs 2 (two) times daily as needed. For shortness of breath      . gabapentin (NEURONTIN) 300 MG capsule Two capsules at bed to help pains  60 capsule  5  . Ipratropium-Albuterol (COMBIVENT RESPIMAT) 20-100 MCG/ACT AERS respimat Inhale 2 puffs into the lungs every 6 (six) hours. Take 2 puffs 4 times daily to help with breathing.      . latanoprost (XALATAN) 0.005 % ophthalmic solution Place 1 drop into both eyes at bedtime.        Marland Kitchen letrozole (FEMARA) 2.5 MG tablet Take 1 tablet (2.5 mg total) by mouth at bedtime.  90 tablet  4  . levETIRAcetam (KEPPRA) 500 MG tablet Take 1 tablet (500 mg total) by mouth 2 (two) times daily.  60 tablet  11  . levothyroxine (SYNTHROID, LEVOTHROID) 75 MCG tablet Take one tablet once a day for thyroid supplement  100 tablet  3  .  memantine (NAMENDA) 10 MG tablet Take 1 tablet (10 mg total) by mouth 2 (two) times daily.  60 tablet  6  . Multiple Vitamins-Minerals (MULTIVITAMIN GUMMIES ADULT) CHEW Chew by mouth. Chew two gummi es a day      . NIFEdipine (NIFEDICAL XL) 30 MG 24 hr tablet One daily to control BP  30 tablet  5  . omeprazole (PRILOSEC) 20 MG capsule Take 20 mg by mouth daily. Take 1 tablet daily to reduce stomach acids.      . pilocarpine (PILOCAR) 1 % ophthalmic solution Place 1 drop into both eyes 3 (three) times daily. Instill 1 drop four times daily into both eyes for glaucoma.      . Probiotic Product (ALIGN) 4 MG CAPS Take by mouth. Take 1 capsule daily to help irritable colon. Only while on antibiotics.      Marland Kitchen warfarin (COUMADIN) 5 MG tablet Take 1 tablet daily except  1/2 tablet on Mondays  30 tablet  4   No current facility-administered medications on file prior to visit.    Review of Systems  Constitutional: Positive for fatigue. Negative for fever, diaphoresis, activity change, appetite change and unexpected weight change.  HENT: Positive for hearing loss. Negative for ear pain, congestion, neck pain and neck stiffness.   Eyes: Positive for visual disturbance.       Prescription lenses  Respiratory: Negative for apnea, cough, choking, chest tightness, shortness of breath and wheezing.   Cardiovascular: Positive for leg swelling. Negative for chest pain and palpitations.  Gastrointestinal: Negative for abdominal pain and abdominal distention.  Endocrine: Negative for cold intolerance, polydipsia, polyphagia and polyuria.  Genitourinary: Positive for frequency. Negative for decreased urine volume, difficulty urinating and pelvic pain.       Urinary incontinence.  Musculoskeletal: Positive for myalgias, arthralgias and gait problem. Negative for back pain and joint swelling.  Skin:       Erythema of the left lower leg. Associated with venous insufficiency and chronic edema. The area is not warm. It is mildly tender to touch.  Allergic/Immunologic: Negative.   Neurological: Positive for weakness. Negative for dizziness, tremors, seizures, syncope, facial asymmetry, speech difficulty, light-headedness, numbness and headaches.  Hematological: Negative.   Psychiatric/Behavioral: Positive for behavioral problems, confusion, sleep disturbance, decreased concentration and agitation. Negative for suicidal ideas and hallucinations. The patient is nervous/anxious. The patient is not hyperactive.        Objective:BP 128/84  Pulse 66  Temp(Src) 97.5 F (36.4 C) (Oral)  Wt 165 lb (74.844 kg)  BMI 29.24 kg/m2  SpO2 90%    Physical Exam  Constitutional: No distress.  HENT:  Head: Normocephalic and atraumatic.  Diminished hearing bilaterally  Eyes:  Conjunctivae and EOM are normal. Left eye exhibits no discharge.  Neck: Normal range of motion. Neck supple. No JVD present. No tracheal deviation present. No thyromegaly present.  Cardiovascular: Normal rate, regular rhythm and normal heart sounds.  Exam reveals no gallop and no friction rub.   No murmur heard. Pulmonary/Chest: Effort normal and breath sounds normal. No respiratory distress. She has no wheezes. She has no rales.  Abdominal: Bowel sounds are normal. She exhibits no distension. There is no tenderness.  Musculoskeletal: Normal range of motion. She exhibits edema and tenderness.  Lymphadenopathy:    She has no cervical adenopathy.  Neurological: She is alert. No cranial nerve deficit. Coordination normal.  Skin: Skin is warm and dry. She is not diaphoretic. There is erythema. No pallor.  Psychiatric:  Significant dementia. Althoughcalm  at our office today, the family describes her episodes of agitation at home.     Office Visit on 03/14/2013  Component Date Value Range Status  . INR 03/14/2013 2.9   Final  Office Visit on 02/28/2013  Component Date Value Range Status  . INR 02/28/2013 3.5   Final  Office Visit on 02/09/2013  Component Date Value Range Status  . TSH 02/09/2013 9.290* 0.450 - 4.500 uIU/mL Final  . COMMENT 02/09/2013 Comment   Final   Comment: The Endocrine Society recommends against routine treatment for                          patients with elevated TSH levels below 10.000 uIU/mL if free T4 or                          T4 are normal.  Thyroid function should be monitored at 6 to 12                          month intervals. Women who are pregnant or hope to become pregnant                          in the near future deserve special consideration.  Office Visit on 01/24/2013  Component Date Value Range Status  . INR 01/24/2013 3.0   Final  Office Visit on 12/27/2012  Component Date Value Range Status  . INR 12/27/2012 2.7   Final        Assessment  & Plan:  Incontinence of urine - Plan: mirabegron ER (MYRBETRIQ) 25 MG TB24 tablet  Alzheimer's disease:continue taking 20 mg Namenda qd. Once Namenda XR are in stores, then switch to 28 mg capsule qd  Pain in the groin - Plan: CT Abdomen Pelvis Wo Contrast  Chronic venous insufficiency: unchanged  GERD (gastroesophageal reflux disease: chronic  Weakness generalized: unchanged  HYPERTENSION.: controlled

## 2013-03-28 ENCOUNTER — Other Ambulatory Visit: Payer: Medicare Other

## 2013-03-29 ENCOUNTER — Ambulatory Visit
Admission: RE | Admit: 2013-03-29 | Discharge: 2013-03-29 | Disposition: A | Discharge status: Home / Self Care | Payer: PRIVATE HEALTH INSURANCE | Source: Ambulatory Visit | Attending: Internal Medicine | Admitting: Internal Medicine

## 2013-03-29 DIAGNOSIS — R1030 Lower abdominal pain, unspecified: Secondary | ICD-10-CM

## 2013-04-11 ENCOUNTER — Ambulatory Visit (INDEPENDENT_AMBULATORY_CARE_PROVIDER_SITE_OTHER): Payer: Medicare Other | Admitting: Pharmacotherapy

## 2013-04-11 ENCOUNTER — Encounter: Payer: Self-pay | Admitting: Pharmacotherapy

## 2013-04-11 VITALS — BP 140/82 | HR 70 | Temp 96.7°F | Wt 150.0 lb

## 2013-04-11 DIAGNOSIS — Z7901 Long term (current) use of anticoagulants: Secondary | ICD-10-CM

## 2013-04-11 DIAGNOSIS — I82409 Acute embolism and thrombosis of unspecified deep veins of unspecified lower extremity: Secondary | ICD-10-CM

## 2013-04-11 LAB — POCT INR: INR: 3

## 2013-04-11 NOTE — Patient Instructions (Signed)
Continue Coumadin 5mg  daily except 2.5mg  on Mondays

## 2013-04-11 NOTE — Progress Notes (Signed)
Subjective:     Patient ID: Jennifer Brennan, female   DOB: 1923-12-24, 79 y.o.   MRN: [id number]  HPI Her last INR was OK at 2.9. Current Coumadin dose is 5mg  QD except 2.5mg  Mondays Denies missed doses. Denies unusual bleeding or bruising. Denies CP, SOB, falls. Consistent with vitamin K intake.   Review of Systems  HENT: Negative for nosebleeds.   Respiratory: Negative for shortness of breath.   Cardiovascular: Negative for chest pain.  Gastrointestinal: Negative for blood in stool and anal bleeding.  Genitourinary: Negative for hematuria.       Objective:   Physical Exam  Constitutional: She is oriented to person, place, and time. She appears well-developed and well-nourished.  Eyes:  Legally blind  Cardiovascular: Normal rate, regular rhythm and normal heart sounds.   Pulmonary/Chest: Effort normal and breath sounds normal.  Neurological: She is alert and oriented to person, place, and time.  Skin: Skin is warm and dry.  Psychiatric: She has a normal mood and affect. Her behavior is normal.    BP:   140/82,  HR 70  Wt:  150 lb     Assessment:     INR 3.0      Plan:     1.  INR at goal 2-3 2.  Continue Coumadin 5mg  QD except 2.5mg  on Mondays. 3.  RTC in 1 month

## 2013-04-25 DIAGNOSIS — L659 Nonscarring hair loss, unspecified: Secondary | ICD-10-CM | POA: Insufficient documentation

## 2013-05-02 ENCOUNTER — Other Ambulatory Visit: Payer: Self-pay | Admitting: *Deleted

## 2013-05-02 MED ORDER — FENTANYL 12 MCG/HR TD PT72: 12.5000 ug | MEDICATED_PATCH | TRANSDERMAL | Status: DC

## 2013-05-09 ENCOUNTER — Ambulatory Visit (INDEPENDENT_AMBULATORY_CARE_PROVIDER_SITE_OTHER): Payer: Medicare Other | Admitting: Pharmacotherapy

## 2013-05-09 ENCOUNTER — Encounter: Payer: Self-pay | Admitting: Pharmacotherapy

## 2013-05-09 ENCOUNTER — Other Ambulatory Visit: Payer: Self-pay | Admitting: *Deleted

## 2013-05-09 VITALS — BP 132/78 | HR 66 | Temp 98.6°F | Resp 14 | Wt 157.0 lb

## 2013-05-09 DIAGNOSIS — I82409 Acute embolism and thrombosis of unspecified deep veins of unspecified lower extremity: Secondary | ICD-10-CM

## 2013-05-09 DIAGNOSIS — C50912 Malignant neoplasm of unspecified site of left female breast: Secondary | ICD-10-CM

## 2013-05-09 DIAGNOSIS — Z7901 Long term (current) use of anticoagulants: Secondary | ICD-10-CM

## 2013-05-09 LAB — POCT INR: INR: 3.5

## 2013-05-09 MED ORDER — WARFARIN SODIUM 5 MG PO TABS: ORAL_TABLET | ORAL | Status: DC

## 2013-05-09 MED ORDER — LETROZOLE 2.5 MG PO TABS: 2.5000 mg | ORAL_TABLET | Freq: Every day | ORAL | Status: DC

## 2013-05-09 NOTE — Patient Instructions (Signed)
INR 3.5 Decrease Coumadin 1 tablet daily except 1/2 tablet on Mondays and Thursdays

## 2013-05-09 NOTE — Progress Notes (Signed)
Subjective:     Patient ID: Jennifer Brennan, female   DOB: 04-21-1924, 79 y.o.   MRN: [id number]  HPI Last INR was OK at 3.0 Current Coumadin dose is 5mg  QD except 2.5mg  Mondays. Denies missed doses. Denies unusual bleeding or bruising. Denies CP, SOB, falls. She has been eating less vitamin K foods.  Review of Systems  HENT: Negative for nosebleeds.   Respiratory: Negative for shortness of breath.   Cardiovascular: Negative for chest pain.  Gastrointestinal: Negative for blood in stool and anal bleeding.  Genitourinary: Negative for hematuria.       Objective:   Physical Exam  Constitutional: She is oriented to person, place, and time. She appears well-developed and well-nourished.  Eyes:  Legally blind  Cardiovascular: Normal rate, regular rhythm and normal heart sounds.   Pulmonary/Chest: Effort normal and breath sounds normal.  Musculoskeletal:  Using wheelchair today  Neurological: She is alert and oriented to person, place, and time.  Skin: Skin is warm and dry.  Psychiatric: She has a normal mood and affect. Her behavior is normal.    BP:  132/78   HR:  66   Wt:  157lb     Assessment:     INR 3.5     Plan:     INR above goal 2-3 Decrease Coumadin 5mg  QD except 2.5mg  M/Th RTC in 2 weeks

## 2013-05-11 ENCOUNTER — Other Ambulatory Visit: Payer: Self-pay | Admitting: Physician Assistant

## 2013-05-11 DIAGNOSIS — C50919 Malignant neoplasm of unspecified site of unspecified female breast: Secondary | ICD-10-CM

## 2013-05-12 ENCOUNTER — Other Ambulatory Visit (HOSPITAL_BASED_OUTPATIENT_CLINIC_OR_DEPARTMENT_OTHER): Payer: Medicare Other | Admitting: Lab

## 2013-05-12 ENCOUNTER — Telehealth: Payer: Self-pay | Admitting: Oncology

## 2013-05-12 ENCOUNTER — Encounter: Payer: Self-pay | Admitting: Physician Assistant

## 2013-05-12 ENCOUNTER — Ambulatory Visit (HOSPITAL_BASED_OUTPATIENT_CLINIC_OR_DEPARTMENT_OTHER): Payer: Medicare Other | Admitting: Physician Assistant

## 2013-05-12 VITALS — BP 181/94 | HR 73 | Temp 97.8°F | Resp 20 | Ht 63.0 in | Wt 168.5 lb

## 2013-05-12 DIAGNOSIS — M810 Age-related osteoporosis without current pathological fracture: Secondary | ICD-10-CM

## 2013-05-12 DIAGNOSIS — C50919 Malignant neoplasm of unspecified site of unspecified female breast: Secondary | ICD-10-CM

## 2013-05-12 DIAGNOSIS — Z170 Estrogen receptor positive status [ER+]: Secondary | ICD-10-CM

## 2013-05-12 DIAGNOSIS — Z1231 Encounter for screening mammogram for malignant neoplasm of breast: Secondary | ICD-10-CM

## 2013-05-12 DIAGNOSIS — I872 Venous insufficiency (chronic) (peripheral): Secondary | ICD-10-CM

## 2013-05-12 DIAGNOSIS — L989 Disorder of the skin and subcutaneous tissue, unspecified: Secondary | ICD-10-CM

## 2013-05-12 DIAGNOSIS — E559 Vitamin D deficiency, unspecified: Secondary | ICD-10-CM

## 2013-05-12 DIAGNOSIS — C50219 Malignant neoplasm of upper-inner quadrant of unspecified female breast: Secondary | ICD-10-CM

## 2013-05-12 HISTORY — DX: Age-related osteoporosis without current pathological fracture: M81.0

## 2013-05-12 LAB — CBC WITH DIFFERENTIAL/PLATELET
EOS%: 2.5 % (ref 0.0–7.0)
Eosinophils Absolute: 0.2 10*3/uL (ref 0.0–0.5)
HCT: 42.1 % (ref 34.8–46.6)
LYMPH%: 41 % (ref 14.0–49.7)
MCH: 25 pg — ABNORMAL LOW (ref 25.1–34.0)
MCHC: 31.5 g/dL (ref 31.5–36.0)
MCV: 79.2 fL — ABNORMAL LOW (ref 79.5–101.0)
MONO#: 0.6 10*3/uL (ref 0.1–0.9)
MONO%: 8.9 % (ref 0.0–14.0)
NEUT#: 3.4 10*3/uL (ref 1.5–6.5)
NEUT%: 46.8 % (ref 38.4–76.8)
Platelets: 211 10*3/uL (ref 145–400)
RBC: 5.32 10*6/uL (ref 3.70–5.45)
RDW: 15.9 % — ABNORMAL HIGH (ref 11.2–14.5)
WBC: 7.3 10*3/uL (ref 3.9–10.3)

## 2013-05-12 LAB — COMPREHENSIVE METABOLIC PANEL (CC13)
ALT: 10 U/L (ref 0–55)
Alkaline Phosphatase: 82 U/L (ref 40–150)
Anion Gap: 13 mEq/L — ABNORMAL HIGH (ref 3–11)
CO2: 26 mEq/L (ref 22–29)
Creatinine: 1.1 mg/dL (ref 0.6–1.1)
Glucose: 89 mg/dl (ref 70–140)
Potassium: 3.6 mEq/L (ref 3.5–5.1)
Total Bilirubin: 0.42 mg/dL (ref 0.20–1.20)

## 2013-05-12 NOTE — Progress Notes (Signed)
ID: Jennifer Brennan   DOB: 02-02-1924  MR#: [id number]  NWG#:[id number]  PCP: Kimber Relic, MD GYN:  SUAbigail Miyamoto, MD OTHER MD:  CHIEF COMPLAINT:  Left Breast Cancer   HISTORY OF PRESENT ILLNESS: The patient had some pain in her left breast associated with a palpable abnormality. She brought this to Dr. Mechele Collin attention and he set her up for bilateral diagnostic mammography and left ultrasonography which was performed October 11th, 2011 at [facility] Dr. Manson Passey found a spiculated mass in the upper inner quadrant of the left breast which was palpable and associated with skin dimpling. Ultrasound shows a hypoechoic mass with acoustic shadowing, spiculated, measuring 2.0 cm. The left axilla showed no enlarged lymph nodes. Biopsy was performed the same day and showed (invasive ductal carcinoma, Grade 2, with some scirrhous changes. The tumor was estrogen 97% positive, progesterone 64% positive with a proliferative marker of 18% and no amplification of Her-2 by CISH with a ratio of 0.94.  With this information, the patient was referred to Dr. Magnus Ivan, and she was felt not to be an MRI candidate because of her multiple medical problems as listed below. The case was presented at the Multidisciplinary Breast Cancer Conference on October 19th and the consensus there was that the patient was a good candidate for breast conserving surgery followed by hormones.  Accordingly, the patient is status post left mastectomy and sentinel lymph node sampling in November of 2011 for a T2 N0, grade 3 invasive ductal carcinoma. Tumor was strongly ER and PR positive, HER-2/neu negative, with a borderline MIP-1. She began letrozole in May of 2012, 2.5 mg daily with good tolerance.     INTERVAL HISTORY: Jennifer Brennan returns today accompanied by her daughter for routine followup of her left breast carcinoma.  She continues on letrozole with good tolerance, and interval history is generally unremarkable  today.  Mayrin continues to be followed regularly with Dr. Chilton Si. She continues to have problems with venous insufficiency, but fortunately has no current ulcers on her legs. She does have some occasional pain in the right leg which is intermittent. Her shortness of breath has improved, and she is no longer on home oxygen. Of course Jennifer Brennan is blind and requires quite a bit of assistance. She also has a diagnosis of Alzheimer's and continues on Namenda, 10 mg twice daily.   REVIEW OF SYSTEMS: Agnes denies any recent illnesses and has had no fevers or chills. She has occasional hot flashes, but these are not particularly problematic to her. Her appetite is fair. She denies any problems with nausea and is having no change in bowel or bladder habits.  she's had no recent cough, phlegm production, increased shortness of breath, or chest pain. She has some occasional shooting pains in the left chest wall area of her mastectomy, but these are brief and sporadic. Otherwise, she denies any other pain today, and has had no peripheral swelling. She denies any abnormal headaches.   A detailed review of systems is otherwise stable and noncontributory.   PAST MEDICAL HISTORY: Past Medical History  Diagnosis Date  . Hypertension   . Hypothyroidism   . Seizure disorder   . History of cerebrovascular accident   . Severe stage glaucoma     legally blind  . Depression with anxiety   . OAB (overactive bladder)   . ADENOCARCINOMA, LEFT BREAST 04/11/2010    s/p L mastectomy, on femara x 1yr  . BACK PAIN, LUMBAR, CHRONIC   . DVT  05/2007    RLE following R THR - chronic coumadin, chronic RLE pain  . GLAUCOMA   . OSTEOARTHRITIS, HANDS, BILATERAL   . OSTEOPENIA   . Retinal ischemia   . Sciatica of right side     chronic RLE pain, multifactorial - MRI T/L spine 02/2011  . VITAMIN D DEFICIENCY dx 08/2008  . Venous ulcer of right lower extremity with varicose veins   . Dementia   . Vulvar abscess   .  Unspecified hypothyroidism   . Unspecified vitamin D deficiency   . Unspecified urinary incontinence   . Renal disorder   . Varicose veins of lower extremity   . Varicose veins of lower extremities with ulcer   . Unspecified disorder of kidney and ureter   . Stevens-Johnson syndrome   . Sebaceous cyst   . Osteoarthrosis, unspecified whether generalized or localized, unspecified site   . Disorder of bone and cartilage, unspecified   . Contusion of lower leg   . Cancer   . Depression   . Seizures   . Stroke   . Osteoporosis, unspecified 05/12/2013    PAST SURGICAL HISTORY: Past Surgical History  Procedure Laterality Date  . Total abdominal hysterectomy    . Total hip arthroplasty  10/2006    right hip  . Refractive surgery      B/L  . Breast surgery  03/2010    breast biopsy, L mastectomy   . Tonsillectomy      FAMILY HISTORY Family History  Problem Relation Age of Onset  . Ovarian cancer Mother   . Cancer Mother   . Deep vein thrombosis Mother   . Heart disease Mother   . Arthritis Other     grandparents  . Lung cancer Other   . Heart disease Other     parent  . Cancer Father     BRAIN TUMOR  . Cancer Brother   . Hyperlipidemia Brother   . Hypertension Brother   . Stroke Brother   . Arthritis Brother   . Sickle cell anemia Daughter   . Heart disease Daughter   . Sickle cell anemia Daughter     GYNECOLOGIC HISTORY: She is GX P4. First pregnancy to term at age 79. She went through the change of life at the age of 79 and did take hormones for several years but she cannot recall how many.   SOCIAL HISTORY: The patient's husband of 43+ years, Aranza Geddes, used to work as a Facilities manager in a [employer], maintenance. He has some hearing loss. Daughter Kirby Funk lives in [place], and is retired. Daughter Therapist, art also lives in [place]. She is disabled secondary to rheumatoid arthritis. Daughter Lavell Luster lives in [place]. She is disabled secondary to  sickle cell disease. The patient is a Systems analyst.    ADVANCED DIRECTIVES:  She has a Living Will in place although I do not have a copy of it.   HEALTH MAINTENANCE: (Updated November 2014) History  Substance Use Topics  . Smoking status: Former Smoker -- 45 years    Types: Cigarettes    Quit date: 10/16/1979  . Smokeless tobacco: Never Used  . Alcohol Use: No     Colonoscopy:  Sept 2011, Dr. Artist Pais  PAP: Not on file  Bone density: 12/06/2010 at [facility], osteoporosis  Lipid panel: Dr. Chilton Si   Allergies  Allergen Reactions  . Bactrim [Sulfamethoxazole-Trimethoprim] Other (See Comments)    MD stopped due to kidney failure Also causes lips to swell  .  Furosemide Other (See Comments)    MD stopped due to kidney failure  . Percocet [Oxycodone-Acetaminophen] Other (See Comments)    Causes SEIZURES  . Valsartan Other (See Comments)    MD stopped due to kidney failure  . Doxycycline Swelling    Causes lips to swell, wheezing  . Penicillins Hives  . Morphine Sulfate Itching and Rash  . Sertraline Hcl Rash    Current Outpatient Prescriptions  Medication Sig Dispense Refill  . brimonidine (ALPHAGAN) 0.15 % ophthalmic solution Place 1 drop into both eyes 2 (two) times daily.      . Cholecalciferol 2000 UNITS TABS Take 1 tablet by mouth every morning.      . Cranberry-Vitamin C-Vitamin E 4200-20-3 MG-MG-UNIT CAPS Take by mouth. Take one tablet once a day      . docusate sodium (COLACE) 100 MG capsule Take 100 mg by mouth daily. Take one tablet twice a day as needed for  Constipation      . dorzolamide-timolol (COSOPT) 22.3-6.8 MG/ML ophthalmic solution Place 1 drop into both eyes 2 (two) times daily.        . fentaNYL (DURAGESIC - DOSED MCG/HR) 12 MCG/HR Place 1 patch (12.5 mcg total) onto the skin every 3 (three) days.  10 patch  0  . gabapentin (NEURONTIN) 300 MG capsule Two capsules at bed to help pains  60 capsule  5  . latanoprost (XALATAN) 0.005 % ophthalmic solution Place 1 drop into  both eyes at bedtime.        Marland Kitchen letrozole (FEMARA) 2.5 MG tablet Take 1 tablet (2.5 mg total) by mouth at bedtime.  90 tablet  0  . levETIRAcetam (KEPPRA) 500 MG tablet Take 1 tablet (500 mg total) by mouth 2 (two) times daily.  60 tablet  11  . levothyroxine (SYNTHROID, LEVOTHROID) 75 MCG tablet Take one tablet once a day for thyroid supplement  100 tablet  3  . memantine (NAMENDA) 10 MG tablet Take 1 tablet (10 mg total) by mouth 2 (two) times daily.  60 tablet  6  . mirabegron ER (MYRBETRIQ) 25 MG TB24 tablet Take 1 tablet (25 mg total) by mouth daily. Take one tablet at bedtime for bladder.  30 tablet  5  . Multiple Vitamins-Minerals (MULTIVITAMIN GUMMIES ADULT) CHEW Chew by mouth. Chew two gummi es a day      . NIFEdipine (NIFEDICAL XL) 30 MG 24 hr tablet One daily to control BP  30 tablet  5  . omeprazole (PRILOSEC) 20 MG capsule Take 20 mg by mouth daily. Take 1 tablet daily to reduce stomach acids.      . pilocarpine (PILOCAR) 1 % ophthalmic solution Place 1 drop into both eyes 3 (three) times daily. Instill 1 drop four times daily into both eyes for glaucoma.      . warfarin (COUMADIN) 5 MG tablet Take 1 tablet daily except 1/2 tablet on Mondays and Thursdays  30 tablet  4  . acetaminophen (TYLENOL) 500 MG tablet Take 1,000 mg by mouth every 6 (six) hours as needed. pain      . albuterol (PROVENTIL HFA;VENTOLIN HFA) 108 (90 BASE) MCG/ACT inhaler Inhale 2 puffs into the lungs every 6 (six) hours as needed. For shortness of breath.      . diphenhydrAMINE (BENADRYL) 25 MG tablet Take 12.5 mg by mouth 2 (two) times daily as needed. Pt took while on antibiotics      . Fluticasone-Salmeterol (ADVAIR) 250-50 MCG/DOSE AEPB Inhale 1 puff into the lungs 2 (  two) times daily as needed. For shortness of breath      . Ipratropium-Albuterol (COMBIVENT RESPIMAT) 20-100 MCG/ACT AERS respimat Inhale 2 puffs into the lungs every 6 (six) hours. Take 2 puffs 4 times daily to help with breathing.      . Probiotic  Product (ALIGN) 4 MG CAPS Take by mouth. Take 1 capsule daily to help irritable colon. Only while on antibiotics.      Marland Kitchen tiotropium (SPIRIVA) 18 MCG inhalation capsule Place 1 capsule (18 mcg total) into inhaler and inhale daily.  30 capsule  5   No current facility-administered medications for this visit.    OBJECTIVE: Elderly African American female examined in a wheelchair, appears comfortable and is in no acute distress  Filed Vitals:   05/12/13 1437  BP: 181/94  Pulse: 73  Temp: 97.8 F (36.6 C)  Resp: 20     Body mass index is 29.86 kg/(m^2).    ECOG FS: 2 Filed Weights   05/12/13 1437  Weight: 168 lb 8 oz (76.431 kg)   Physical Exam: HEENT:  Sclerae anicteric.  Oropharynx clear.  Poor dentition. NODES:  No cervical or supraclavicular lymphadenopathy palpated.  BREAST EXAM:  Right breast is unremarkable. Patient status post left mastectomy with no evidence of local recurrence. Axillae are benign bilaterally, with no palpable lymphadenopathy. LUNGS:  Clear to auscultation bilaterally.  No wheezes or rhonchi HEART:  Regular rate and rhythm. No murmur  ABDOMEN:  Soft, nontender.  Positive bowel sounds.  MSK:  No focal spinal tenderness to palpation.  EXTREMITIES:  No peripheral edema.   SKIN:  Skin changes in the lower legs secondary to venous insufficiency, but currently with no ulcers noted. There is also an irregularly shaped, hyperpigmented lesion in the center of the upper back measuring approximately 1 cm in diameter. NEURO:  Nonfocal. Alert.  Appropriate affect.     LAB RESULTS: Lab Results  Component Value Date   WBC 7.3 05/12/2013   NEUTROABS 3.4 05/12/2013   HGB 13.3 05/12/2013   HCT 42.1 05/12/2013   MCV 79.2* 05/12/2013   PLT 211 05/12/2013      Chemistry      Component Value Date/Time   NA 143 05/12/2013 1404   NA 140 09/25/2012 1444   K 3.6 05/12/2013 1404   K 4.9 09/25/2012 1444   CL 106 11/12/2012 1357   CL 102 09/25/2012 1444   CO2 26 05/12/2013 1404    CO2 26 09/25/2012 1444   BUN 10.3 05/12/2013 1404   BUN 12 09/25/2012 1444   CREATININE 1.1 05/12/2013 1404   CREATININE 1.22* 09/25/2012 1444   CREATININE 1.14* 01/20/2011 1516      Component Value Date/Time   CALCIUM 9.7 05/12/2013 1404   CALCIUM 9.2 09/25/2012 1444   ALKPHOS 82 05/12/2013 1404   ALKPHOS 45 03/02/2012 0530   AST 18 05/12/2013 1404   AST 18 03/02/2012 0530   ALT 10 05/12/2013 1404   ALT 15 03/02/2012 0530   BILITOT 0.42 05/12/2013 1404   BILITOT 0.3 03/02/2012 0530       Lab Results  Component Value Date   LABCA2 29 04/27/2012     STUDIES:  Most recent right mammogram at [facility] on 06/02/2012 was unremarkable.  Most recent bone density at [facility] on 04/07/2011 showed osteoporosis.     ASSESSMENT: 79 y.o. [employer] woman  (1)  status post left mastectomy and sentinel lymph node sampling November 2011 for a T2 N0, grade 3 invasive ductal carcinoma which  was strongly estrogen and progesterone-receptor positive, HER-2-negative, with a borderline MIB-1.   (2)  She started letrozole May 2012 and is tolerating it without unusual side effects.   (3)  osteoporosis, being followed with observation   PLAN: This case was reviewed with Dr. Darnelle Catalan, and we are making no changes to this he is currently regimen. She'll continue on letrozole, the plan being to complete a total of 5 years, continuing until May of 2017.   She is due for both her right mammogram and her bone density in December. She has requested to change from [facility] to the [facility], and we will ask to have both of those appointments scheduled accordingly in mid December. We will continue to follow her osteoporosis with observation alone. This was reviewed with Jenascia and her daughter, both of whom voice agreement.   At this point, we will begin seeing her on an annual basis. We will actually wait and see her in the summer of next year instead of November, so that her appointment as soon after her annual  mammogram. They know to call prior that time with any changes or problems.  Glenis Musolf PA-C    05/12/2013

## 2013-05-22 ENCOUNTER — Other Ambulatory Visit: Payer: Self-pay | Admitting: Internal Medicine

## 2013-05-23 ENCOUNTER — Ambulatory Visit (INDEPENDENT_AMBULATORY_CARE_PROVIDER_SITE_OTHER): Payer: Medicare Other | Admitting: Pharmacotherapy

## 2013-05-23 ENCOUNTER — Encounter: Payer: Self-pay | Admitting: Pharmacotherapy

## 2013-05-23 VITALS — BP 148/76 | HR 92 | Wt 163.0 lb

## 2013-05-23 DIAGNOSIS — I82409 Acute embolism and thrombosis of unspecified deep veins of unspecified lower extremity: Secondary | ICD-10-CM

## 2013-05-23 DIAGNOSIS — Z7901 Long term (current) use of anticoagulants: Secondary | ICD-10-CM

## 2013-05-23 LAB — POCT INR: INR: 2

## 2013-05-23 NOTE — Patient Instructions (Signed)
INR 2.0 Continue Coumadin 5mg (1 tablet) daily except 2.5mg (1/2 tablet) on Mondays and Thursdays 

## 2013-05-23 NOTE — Progress Notes (Signed)
Subjective:     Patient ID: Jennifer Brennan, female   DOB: 01-10-1924, 79 y.o.   MRN: [id number]  HPI Last INR was high at 3.5 Coumadin was reduced to 5mg  qD except 2.5mg  M/Th. She did have more vitamin K foods with Thanksgiving. Denies missed doses. Denies unusual bleeding or bruising. Denies CP, SOB, falls   Review of Systems  HENT: Negative for nosebleeds.   Respiratory: Negative for shortness of breath.   Cardiovascular: Negative for chest pain.  Gastrointestinal: Negative for blood in stool and anal bleeding.  Genitourinary: Negative for hematuria.       Objective:   Physical Exam  Constitutional: She is oriented to person, place, and time. She appears well-developed and well-nourished.  Cardiovascular: Normal rate, regular rhythm and normal heart sounds.   Pulmonary/Chest: Effort normal and breath sounds normal.  Neurological: She is alert and oriented to person, place, and time.  Skin: Skin is warm and dry.  Psychiatric: She has a normal mood and affect. Her behavior is normal.    BP:  148/76  HR:  92   Wt:  163lb     Assessment:     INR 2.0     Plan:     1.  INR at goal 2-3 2.  Continue Coumadin 5mg  QD except 2.5mg  M/Th 3.  RTC in 1 month

## 2013-06-09 ENCOUNTER — Other Ambulatory Visit: Payer: Self-pay | Admitting: *Deleted

## 2013-06-09 MED ORDER — FENTANYL 12 MCG/HR TD PT72: 12.5000 ug | MEDICATED_PATCH | TRANSDERMAL | Status: DC

## 2013-06-15 ENCOUNTER — Telehealth: Payer: Self-pay

## 2013-06-15 NOTE — Telephone Encounter (Signed)
Pt's daughter notified VIA phone regarding recommendations.

## 2013-06-15 NOTE — Telephone Encounter (Signed)
Patient does not need to take 2 different multivitamins with minerals. She does not need the iron. I would suggest that she continue on her chewable multivitamin without iron.

## 2013-06-15 NOTE — Telephone Encounter (Signed)
Patient's daughter called the triage line questioning her mother starting a new multivitamin (Centrum A-Z with 18 mg of Iron), patient was previously taking chewable multivitamin that did not have any iron.   I informed patient's daughter DO NOT have patient start new multivitamin until further advised. Patient's daughter verbalized understanding.  Patient with pending OV with Ronal Fear on 06/27/2013  Please advise

## 2013-06-16 ENCOUNTER — Other Ambulatory Visit: Payer: Self-pay | Admitting: Internal Medicine

## 2013-06-21 ENCOUNTER — Ambulatory Visit: Payer: Medicare Other

## 2013-06-21 ENCOUNTER — Other Ambulatory Visit: Payer: Medicare Other

## 2013-06-22 ENCOUNTER — Other Ambulatory Visit: Payer: Self-pay | Admitting: Internal Medicine

## 2013-06-27 ENCOUNTER — Encounter: Payer: Self-pay | Admitting: Pharmacotherapy

## 2013-06-27 ENCOUNTER — Ambulatory Visit (INDEPENDENT_AMBULATORY_CARE_PROVIDER_SITE_OTHER): Payer: Medicare Other | Admitting: Pharmacotherapy

## 2013-06-27 VITALS — BP 130/78 | HR 53 | Temp 97.4°F | Resp 18 | Ht 63.0 in | Wt 163.0 lb

## 2013-06-27 DIAGNOSIS — Z7901 Long term (current) use of anticoagulants: Secondary | ICD-10-CM

## 2013-06-27 DIAGNOSIS — I82409 Acute embolism and thrombosis of unspecified deep veins of unspecified lower extremity: Secondary | ICD-10-CM

## 2013-06-27 LAB — POCT INR: INR: 2

## 2013-06-27 NOTE — Patient Instructions (Signed)
INR 2.0 Continue Coumadin 5mg  (1 tablet) daily except 2.5mg  (1/2 tablet) on Mondays and Thursdays

## 2013-06-27 NOTE — Progress Notes (Signed)
Subjective:     Patient ID: Jennifer Brennan, female   DOB: 08/18/1923, 80 y.o.   MRN: [id number]  HPI Last INR was OK at 2.0 Current Coumadin dose is 5mg  QD except 2.5mg  M/Th Denies CP, SOB, falls Denies missed doses. Denies unusual bleeding or bruising. She did have a few more servings of vitamin K foods.   Review of Systems  HENT: Negative for nosebleeds.   Respiratory: Negative for shortness of breath.   Cardiovascular: Negative for chest pain.  Gastrointestinal: Negative for blood in stool and anal bleeding.  Genitourinary: Negative for hematuria.       Objective:   Physical Exam  Constitutional: She is oriented to person, place, and time. She appears well-developed and well-nourished.  HENT:  Right Ear: External ear normal.  Left Ear: External ear normal.  Eyes:  blind  Cardiovascular: Normal rate, regular rhythm and normal heart sounds.   Pulmonary/Chest: Effort normal and breath sounds normal.  Musculoskeletal:  In wheelchair  Neurological: She is alert and oriented to person, place, and time.  Skin: Skin is warm and dry.  Psychiatric: She has a normal mood and affect. Her behavior is normal.    BP:  130/78    HR:  53   Wt:  163lb     Assessment:     INR 2.0     Plan:     1.  INR at goal 2-3 2.  Continue Coumadin 5mg  QD except 2.5mg  M/Th 3.  RTC in 1 month

## 2013-07-06 ENCOUNTER — Telehealth: Payer: Self-pay | Admitting: *Deleted

## 2013-07-06 NOTE — Telephone Encounter (Signed)
Patient daughter called and stated that the Namenda prescribed is too expensive $321.00 and needs something else called into pharmacy. Please Advise

## 2013-07-12 ENCOUNTER — Telehealth: Payer: Self-pay | Admitting: *Deleted

## 2013-07-12 NOTE — Telephone Encounter (Signed)
Patient daughter, Blanch Media, called and stated that her mother slid out of the chair on the porch. No injuries except patient is complaining with right foot hurting. Not swollen or red. No tear or wounds noted. Daughter has been putting heating pads on her foot 30 minutes on and 30 minutes off. Sore all over, so they been giving her Tylenol. Told her to keep doing this and watch that foot for any swelling or redness. They stated they would and will call if any new changes or doesn't get better in 48-72 hours. Agreed with plan

## 2013-07-13 ENCOUNTER — Other Ambulatory Visit: Payer: Self-pay | Admitting: *Deleted

## 2013-07-13 MED ORDER — FENTANYL 12 MCG/HR TD PT72: 12.5000 ug | MEDICATED_PATCH | TRANSDERMAL | Status: DC

## 2013-07-24 ENCOUNTER — Other Ambulatory Visit: Payer: Self-pay | Admitting: Internal Medicine

## 2013-07-26 ENCOUNTER — Ambulatory Visit (INDEPENDENT_AMBULATORY_CARE_PROVIDER_SITE_OTHER): Payer: Medicare Other | Admitting: Internal Medicine

## 2013-07-26 ENCOUNTER — Encounter: Payer: Self-pay | Admitting: Internal Medicine

## 2013-07-26 VITALS — BP 126/78 | HR 70 | Resp 10 | Wt 167.0 lb

## 2013-07-26 DIAGNOSIS — G309 Alzheimer's disease, unspecified: Secondary | ICD-10-CM

## 2013-07-26 DIAGNOSIS — H6120 Impacted cerumen, unspecified ear: Secondary | ICD-10-CM

## 2013-07-26 DIAGNOSIS — N318 Other neuromuscular dysfunction of bladder: Secondary | ICD-10-CM

## 2013-07-26 DIAGNOSIS — F0280 Dementia in other diseases classified elsewhere without behavioral disturbance: Secondary | ICD-10-CM

## 2013-07-26 DIAGNOSIS — H6123 Impacted cerumen, bilateral: Secondary | ICD-10-CM | POA: Insufficient documentation

## 2013-07-26 DIAGNOSIS — I10 Essential (primary) hypertension: Secondary | ICD-10-CM

## 2013-07-26 DIAGNOSIS — K219 Gastro-esophageal reflux disease without esophagitis: Secondary | ICD-10-CM

## 2013-07-26 DIAGNOSIS — E039 Hypothyroidism, unspecified: Secondary | ICD-10-CM

## 2013-07-26 DIAGNOSIS — R5383 Other fatigue: Secondary | ICD-10-CM

## 2013-07-26 DIAGNOSIS — R5381 Other malaise: Secondary | ICD-10-CM

## 2013-07-26 DIAGNOSIS — R32 Unspecified urinary incontinence: Secondary | ICD-10-CM

## 2013-07-26 DIAGNOSIS — R531 Weakness: Secondary | ICD-10-CM

## 2013-07-26 DIAGNOSIS — C50919 Malignant neoplasm of unspecified site of unspecified female breast: Secondary | ICD-10-CM

## 2013-07-26 DIAGNOSIS — Z9181 History of falling: Secondary | ICD-10-CM

## 2013-07-26 MED ORDER — OMEPRAZOLE 20 MG PO CPDR: DELAYED_RELEASE_CAPSULE | ORAL | Status: DC

## 2013-07-26 MED ORDER — MEMANTINE HCL ER 28 MG PO CP24: ORAL_CAPSULE | ORAL | Status: DC

## 2013-07-26 NOTE — Patient Instructions (Signed)
Continue current medications. 

## 2013-07-26 NOTE — Progress Notes (Signed)
Patient ID: Jennifer Brennan, female   DOB: 08-29-1923, 80 y.o.   MRN: [id number]    Location:    [facility]  Place of Service:  OFFICE    Allergies  Allergen Reactions  . Bactrim [Sulfamethoxazole-Trimethoprim] Other (See Comments)    MD stopped due to kidney failure Also causes lips to swell  . Furosemide Other (See Comments)    MD stopped due to kidney failure  . Percocet [Oxycodone-Acetaminophen] Other (See Comments)    Causes SEIZURES  . Valsartan Other (See Comments)    MD stopped due to kidney failure  . Doxycycline Swelling    Causes lips to swell, wheezing  . Penicillins Hives  . Morphine Sulfate Itching and Rash  . Sertraline Hcl Rash    Chief Complaint  Patient presents with  . Medical Managment of Chronic Issues    4 month follow-up   . Hot Flashes    Patient c/o hot flashes x a long time   . FYI    Patient fell with in the last 30 days, injured right foot/leg     HPI:  Hx of fall: slid out of her husband's hands as he was bringing her in the house. Some discomfort in legs after this  Alzheimer's disease - Plan: Memantine HCl ER 28 MG CP24, DISCONTINUED: Memantine HCl ER 28 MG CP24  ADENOCARCINOMA, LEFT BREAST: non evidence of relapse  HYPERTENSION: controlled  HYPOTHYROIDISM: compensated  Incontinence of urine: improved since on Myrbetriq  OVERACTIVE BLADDER: improved since on Myrbetriq  Weakness generalized: persistent  GERD (gastroesophageal reflux disease) - using omeprazole (PRILOSEC) 20 MG capsule dailt  Hearing is worse   Medications: Patient's Medications  New Prescriptions   MEMANTINE HCL ER 28 MG CP24    One daily to help preserve memory  Previous Medications   ACETAMINOPHEN (TYLENOL) 500 MG TABLET    Take 1,000 mg by mouth every 6 (six) hours as needed. pain   ALBUTEROL (PROVENTIL HFA;VENTOLIN HFA) 108 (90 BASE) MCG/ACT INHALER    Inhale 2 puffs into the lungs every 6 (six) hours as needed. For shortness of breath.   BRIMONIDINE  (ALPHAGAN) 0.15 % OPHTHALMIC SOLUTION    Place 1 drop into both eyes 2 (two) times daily.   CHOLECALCIFEROL 2000 UNITS TABS    Take 1 tablet by mouth every morning.   CRANBERRY-VITAMIN C-VITAMIN E 4200-20-3 MG-MG-UNIT CAPS    Take by mouth. Take one tablet once a day   DIPHENHYDRAMINE (BENADRYL) 25 MG TABLET    Take 12.5 mg by mouth 2 (two) times daily as needed. Pt took while on antibiotics   DOCUSATE SODIUM (COLACE) 100 MG CAPSULE    Take 100 mg by mouth daily. Take one tablet twice a day as needed for  Constipation   DORZOLAMIDE-TIMOLOL (COSOPT) 22.3-6.8 MG/ML OPHTHALMIC SOLUTION    Place 1 drop into both eyes 2 (two) times daily.     FENTANYL (DURAGESIC - DOSED MCG/HR) 12 MCG/HR    Place 1 patch (12.5 mcg total) onto the skin every 3 (three) days.   FLUTICASONE-SALMETEROL (ADVAIR) 250-50 MCG/DOSE AEPB    Inhale 1 puff into the lungs 2 (two) times daily as needed. For shortness of breath   GABAPENTIN (NEURONTIN) 300 MG CAPSULE    take 2 capsules by mouth at bedtime for pain   IPRATROPIUM-ALBUTEROL (COMBIVENT RESPIMAT) 20-100 MCG/ACT AERS RESPIMAT    Inhale 2 puffs into the lungs every 6 (six) hours. Take 2 puffs 4 times daily to help with breathing.  LATANOPROST (XALATAN) 0.005 % OPHTHALMIC SOLUTION    Place 1 drop into both eyes at bedtime.     LETROZOLE (FEMARA) 2.5 MG TABLET    Take 1 tablet (2.5 mg total) by mouth at bedtime.   LEVETIRACETAM (KEPPRA) 500 MG TABLET    take 1 tablet by mouth twice a day   LEVOTHYROXINE (SYNTHROID, LEVOTHROID) 75 MCG TABLET    Take one tablet once a day for thyroid supplement   MIRABEGRON ER (MYRBETRIQ) 25 MG TB24 TABLET    Take 1 tablet (25 mg total) by mouth daily. Take one tablet at bedtime for bladder.   MULTIPLE VITAMINS-MINERALS (MULTIVITAMIN GUMMIES ADULT) CHEW    Chew by mouth. Chew two gummi es a day   NIFEDICAL XL 30 MG 24 HR TABLET    take 1 tablet by mouth once daily for blood pressure   PILOCARPINE (PILOCAR) 1 % OPHTHALMIC SOLUTION    Place 1 drop  into both eyes 3 (three) times daily. Instill 1 drop four times daily into both eyes for glaucoma.   PROBIOTIC PRODUCT (ALIGN) 4 MG CAPS    Take by mouth. Take 1 capsule daily to help irritable colon. Only while on antibiotics.   TIOTROPIUM (SPIRIVA) 18 MCG INHALATION CAPSULE    Place 1 capsule (18 mcg total) into inhaler and inhale daily.   WARFARIN (COUMADIN) 5 MG TABLET    Take 1 tablet daily except 1/2 tablet on Mondays and Thursdays  Modified Medications   Modified Medication Previous Medication   OMEPRAZOLE (PRILOSEC) 20 MG CAPSULE omeprazole (PRILOSEC) 20 MG capsule      Take 1 tablet every other day to reduce stomach acids.    Take 20 mg by mouth daily. Take 1 tablet daily to reduce stomach acids.  Discontinued Medications   MEMANTINE (NAMENDA) 10 MG TABLET    Take 1 tablet (10 mg total) by mouth 2 (two) times daily.     Review of Systems  Constitutional: Positive for fatigue. Negative for fever, diaphoresis, activity change, appetite change and unexpected weight change.  HENT: Positive for hearing loss. Negative for congestion and ear pain.   Eyes: Positive for visual disturbance.       Prescription lenses  Respiratory: Negative for apnea, cough, choking, chest tightness, shortness of breath and wheezing.   Cardiovascular: Positive for leg swelling. Negative for chest pain and palpitations.  Gastrointestinal: Negative for abdominal pain and abdominal distention.  Endocrine: Negative for cold intolerance, polydipsia, polyphagia and polyuria.  Genitourinary: Positive for frequency. Negative for decreased urine volume, difficulty urinating and pelvic pain.       Urinary incontinence.  Musculoskeletal: Positive for arthralgias, gait problem and myalgias. Negative for back pain, joint swelling, neck pain and neck stiffness.  Skin:       Erythema of the left lower leg. Associated with venous insufficiency and chronic edema. The area is not warm. It is mildly tender to touch.    Allergic/Immunologic: Negative.   Neurological: Positive for weakness. Negative for dizziness, tremors, seizures, syncope, facial asymmetry, speech difficulty, light-headedness, numbness and headaches.  Hematological: Negative.   Psychiatric/Behavioral: Positive for behavioral problems, confusion, sleep disturbance, decreased concentration and agitation. Negative for suicidal ideas and hallucinations. The patient is nervous/anxious. The patient is not hyperactive.     Filed Vitals:   07/26/13 1458  BP: 126/78  Pulse: 70  Resp: 10  Weight: 167 lb (75.751 kg)  SpO2: 95%   Physical Exam  Constitutional: No distress.  HENT:  Head: Normocephalic and atraumatic.  Diminished hearing bilaterally. Cerumen occlusion bilaterally.  Eyes: Conjunctivae and EOM are normal. Left eye exhibits no discharge.  Neck: Normal range of motion. Neck supple. No JVD present. No tracheal deviation present. No thyromegaly present.  Cardiovascular: Normal rate, regular rhythm and normal heart sounds.  Exam reveals no gallop and no friction rub.   No murmur heard. Pulmonary/Chest: Effort normal and breath sounds normal. No respiratory distress. She has no wheezes. She has no rales.  Abdominal: Bowel sounds are normal. She exhibits no distension and no mass. There is no tenderness.  Musculoskeletal: Normal range of motion. She exhibits edema and tenderness.  Tender at both knees. Tender in the right groin area.  Lymphadenopathy:    She has no cervical adenopathy.  Neurological: She is alert. No cranial nerve deficit. Coordination normal.  Memory deficit.  Skin: Skin is warm and dry. She is not diaphoretic. There is erythema. No pallor.  Waxy scabs on both legs.  Psychiatric:  Significant dementia. Calm at our office today.. Improvement episodes of agitation at home.     Labs reviewed: Office Visit on 06/27/2013  Component Date Value Range Status  . INR 06/27/2013 2.0   Final  Office Visit on 05/23/2013   Component Date Value Range Status  . INR 05/23/2013 2.0   Final  Appointment on 05/12/2013  Component Date Value Range Status  . WBC 05/12/2013 7.3  3.9 - 10.3 10e3/uL Final  . NEUT# 05/12/2013 3.4  1.5 - 6.5 10e3/uL Final  . HGB 05/12/2013 13.3  11.6 - 15.9 g/dL Final  . HCT 05/12/2013 42.1  34.8 - 46.6 % Final  . Platelets 05/12/2013 211  145 - 400 10e3/uL Final  . MCV 05/12/2013 79.2* 79.5 - 101.0 fL Final  . MCH 05/12/2013 25.0* 25.1 - 34.0 pg Final  . MCHC 05/12/2013 31.5  31.5 - 36.0 g/dL Final  . RBC 05/12/2013 5.32  3.70 - 5.45 10e6/uL Final  . RDW 05/12/2013 15.9* 11.2 - 14.5 % Final  . lymph# 05/12/2013 3.0  0.9 - 3.3 10e3/uL Final  . MONO# 05/12/2013 0.6  0.1 - 0.9 10e3/uL Final  . Eosinophils Absolute 05/12/2013 0.2  0.0 - 0.5 10e3/uL Final  . Basophils Absolute 05/12/2013 0.1  0.0 - 0.1 10e3/uL Final  . NEUT% 05/12/2013 46.8  38.4 - 76.8 % Final  . LYMPH% 05/12/2013 41.0  14.0 - 49.7 % Final  . MONO% 05/12/2013 8.9  0.0 - 14.0 % Final  . EOS% 05/12/2013 2.5  0.0 - 7.0 % Final  . BASO% 05/12/2013 0.8  0.0 - 2.0 % Final  . Sodium 05/12/2013 143  136 - 145 mEq/L Final  . Potassium 05/12/2013 3.6  3.5 - 5.1 mEq/L Final  . Chloride 05/12/2013 104  98 - 109 mEq/L Final  . CO2 05/12/2013 26  22 - 29 mEq/L Final  . Glucose 05/12/2013 89  70 - 140 mg/dl Final  . BUN 05/12/2013 10.3  7.0 - 26.0 mg/dL Final  . Creatinine 05/12/2013 1.1  0.6 - 1.1 mg/dL Final  . Total Bilirubin 05/12/2013 0.42  0.20 - 1.20 mg/dL Final  . Alkaline Phosphatase 05/12/2013 82  40 - 150 U/L Final  . AST 05/12/2013 18  5 - 34 U/L Final  . ALT 05/12/2013 10  0 - 55 U/L Final  . Total Protein 05/12/2013 7.6  6.4 - 8.3 g/dL Final  . Albumin 05/12/2013 3.4* 3.5 - 5.0 g/dL Final  . Calcium 05/12/2013 9.7  8.4 - 10.4 mg/dL Final  . Anion  Gap 05/12/2013 13* 3 - 11 mEq/L Final  Office Visit on 05/09/2013  Component Date Value Range Status  . INR 05/09/2013 3.5   Final      Assessment/Plan  Hx of  fall: use caution when transporting her  Alzheimer's disease - Plan: Memantine HCl ER 28 MG CP24  ADENOCARCINOMA, LEFT BREAST: no reoccurrence  HYPERTENSION: controlled  HYPOTHYROIDISM; compensated  Incontinence of urine: improved  OVERACTIVE BLADDER: improved with Myrbetriq  Weakness generalized; stable  GERD (gastroesophageal reflux disease) - Plan: omeprazole (PRILOSEC) 20 MG capsule every other day  Excessive cerumen in both ear canals: lavaged in office. Claimed she was hearing better after this procedure.

## 2013-08-01 ENCOUNTER — Encounter: Payer: Self-pay | Admitting: Pharmacotherapy

## 2013-08-01 ENCOUNTER — Ambulatory Visit (INDEPENDENT_AMBULATORY_CARE_PROVIDER_SITE_OTHER): Payer: Medicare Other | Admitting: Pharmacotherapy

## 2013-08-01 VITALS — BP 146/72 | HR 62 | Temp 98.2°F | Wt 166.0 lb

## 2013-08-01 DIAGNOSIS — I749 Embolism and thrombosis of unspecified artery: Secondary | ICD-10-CM

## 2013-08-01 DIAGNOSIS — Z7901 Long term (current) use of anticoagulants: Secondary | ICD-10-CM

## 2013-08-01 LAB — POCT INR: INR: 2.6

## 2013-08-01 NOTE — Progress Notes (Signed)
Subjective:     Patient ID: Jennifer Brennan, female   DOB: 12/15/1923, 80 y.o.   MRN: [id number]  HPI Last INR was OK at 2.0. Current Coumadin dose is 5mg  QD except 2.5mg  M/Th. Denies missed doses. Denies unusual bleeding or brusing. Denies CP, falls. Consistent with vitamin K intake.  Review of Systems  HENT: Negative for nosebleeds.   Respiratory: Negative for shortness of breath.   Cardiovascular: Negative for chest pain.  Gastrointestinal: Negative for blood in stool and anal bleeding.  Genitourinary: Negative for hematuria.       Objective:   Physical Exam  Constitutional: She is oriented to person, place, and time. She appears well-developed and well-nourished.  Eyes:  Blind   Cardiovascular: Normal rate, regular rhythm and normal heart sounds.   Pulmonary/Chest: Effort normal and breath sounds normal.  Musculoskeletal:  In wheelchair  Neurological: She is alert and oriented to person, place, and time.  Skin: Skin is warm and dry.  Psychiatric: She has a normal mood and affect. Her behavior is normal.    BP:  146/72   HR:  62   Wt:  166lb     Assessment:     INR 2.6     Plan:     1.  INR at goal 2-3. 2.  Continue Coumadin 5mg  QD except 2.5mg  M/Th 3.  RTC in 1 month

## 2013-08-01 NOTE — Patient Instructions (Signed)
INR 2.6 Continue Coumadin 5mg  daily except 2.5mg  on Mondays and Thursdays

## 2013-08-15 ENCOUNTER — Ambulatory Visit (INDEPENDENT_AMBULATORY_CARE_PROVIDER_SITE_OTHER): Payer: Medicare Other | Admitting: Internal Medicine

## 2013-08-15 ENCOUNTER — Encounter: Payer: Self-pay | Admitting: Internal Medicine

## 2013-08-15 VITALS — BP 162/80 | HR 70 | Temp 98.0°F | Wt 169.4 lb

## 2013-08-15 DIAGNOSIS — S31809A Unspecified open wound of unspecified buttock, initial encounter: Secondary | ICD-10-CM

## 2013-08-15 NOTE — Assessment & Plan Note (Signed)
Cont wound cleanser, skin protectant cream, and neosporin to open gluteal crease wound

## 2013-08-15 NOTE — Patient Instructions (Signed)
Continue wound cleanser, skin protectant cream and neosporin ointment

## 2013-08-15 NOTE — Progress Notes (Signed)
Patient ID: Jennifer Brennan, female   DOB: Jan 17, 1924, 80 y.o.   MRN: [id number]   Location:  [facility]   Allergies  Allergen Reactions  . Bactrim [Sulfamethoxazole-Trimethoprim] Other (See Comments)    MD stopped due to kidney failure Also causes lips to swell  . Furosemide Other (See Comments)    MD stopped due to kidney failure  . Percocet [Oxycodone-Acetaminophen] Other (See Comments)    Causes SEIZURES  . Valsartan Other (See Comments)    MD stopped due to kidney failure  . Doxycycline Swelling    Causes lips to swell, wheezing  . Penicillins Hives  . Morphine Sulfate Itching and Rash  . Sertraline Hcl Rash    Chief Complaint  Patient presents with  . Acute Visit    boil on bottom x 3 weeks, progressively gotten worse.  been using Skintegretiy wound cleanser, neosporin & skin protectant    HPI: Patient is a 80 y.o. black female seen in the office today for an acute visit. Has sore on bottom in gluteal fold, but can hardly put medicine on due to pain Has had mrsa in the past  Review of Systems:  Review of Systems  Constitutional: Negative for fever.  Skin: Negative for itching and rash.       See hpi  Neurological: Positive for weakness.    Past Medical History  Diagnosis Date  . Hypertension   . Hypothyroidism   . Seizure disorder   . History of cerebrovascular accident   . Severe stage glaucoma     legally blind  . Depression with anxiety   . OAB (overactive bladder)   . ADENOCARCINOMA, LEFT BREAST 04/11/2010    s/p L mastectomy, on femara x 42yr  . BACK PAIN, LUMBAR, CHRONIC   . DVT 05/2007    RLE following R THR - chronic coumadin, chronic RLE pain  . GLAUCOMA   . OSTEOARTHRITIS, HANDS, BILATERAL   . OSTEOPENIA   . Retinal ischemia   . Sciatica of right side     chronic RLE pain, multifactorial - MRI T/L spine 02/2011  . VITAMIN D DEFICIENCY dx 08/2008  . Venous ulcer of right lower extremity with varicose  veins   . Dementia   . Vulvar abscess   . Unspecified hypothyroidism   . Unspecified vitamin D deficiency   . Unspecified urinary incontinence   . Renal disorder   . Varicose veins of lower extremity   . Varicose veins of lower extremities with ulcer   . Unspecified disorder of kidney and ureter   . Stevens-Johnson syndrome   . Sebaceous cyst   . Osteoarthrosis, unspecified whether generalized or localized, unspecified site   . Disorder of bone and cartilage, unspecified   . Contusion of lower leg   . Cancer   . Depression   . Seizures   . Stroke   . Osteoporosis, unspecified 05/12/2013    Past Surgical History  Procedure Laterality Date  . Total abdominal hysterectomy    . Total hip arthroplasty  10/2006    right hip  . Refractive surgery      B/L  . Breast surgery  03/2010    breast biopsy, L mastectomy   . Tonsillectomy      Social History:   reports that she quit smoking about 33 years ago. Her smoking use included Cigarettes. She smoked 0.00 packs per day for 45 years. She has never used smokeless tobacco. She reports that she  does not drink alcohol or use illicit drugs.  Family History  Problem Relation Age of Onset  . Ovarian cancer Mother   . Cancer Mother   . Deep vein thrombosis Mother   . Heart disease Mother   . Arthritis Other     grandparents  . Lung cancer Other   . Heart disease Other     parent  . Cancer Father     BRAIN TUMOR  . Cancer Brother   . Hyperlipidemia Brother   . Hypertension Brother   . Stroke Brother   . Arthritis Brother   . Sickle cell anemia Daughter   . Heart disease Daughter   . Sickle cell anemia Daughter     Medications: Patient's Medications  New Prescriptions   No medications on file  Previous Medications   ACETAMINOPHEN (TYLENOL) 500 MG TABLET    Take 1,000 mg by mouth every 6 (six) hours as needed. pain   ALBUTEROL (PROVENTIL HFA;VENTOLIN HFA) 108 (90 BASE) MCG/ACT INHALER    Inhale 2 puffs into the lungs every  6 (six) hours as needed. For shortness of breath.   BRIMONIDINE (ALPHAGAN) 0.15 % OPHTHALMIC SOLUTION    Place 1 drop into both eyes 2 (two) times daily.   CHOLECALCIFEROL 2000 UNITS TABS    Take 1 tablet by mouth every morning.   CRANBERRY-VITAMIN C-VITAMIN E 4200-20-3 MG-MG-UNIT CAPS    Take by mouth. Take one tablet once a day   DIPHENHYDRAMINE (BENADRYL) 25 MG TABLET    Take 12.5 mg by mouth 2 (two) times daily as needed. Pt took while on antibiotics   DOCUSATE SODIUM (COLACE) 100 MG CAPSULE    Take 100 mg by mouth daily. Take one tablet twice a day as needed for  Constipation   DORZOLAMIDE-TIMOLOL (COSOPT) 22.3-6.8 MG/ML OPHTHALMIC SOLUTION    Place 1 drop into both eyes 2 (two) times daily.     FENTANYL (DURAGESIC - DOSED MCG/HR) 12 MCG/HR    Place 1 patch (12.5 mcg total) onto the skin every 3 (three) days.   FLUTICASONE-SALMETEROL (ADVAIR) 250-50 MCG/DOSE AEPB    Inhale 1 puff into the lungs 2 (two) times daily as needed. For shortness of breath   GABAPENTIN (NEURONTIN) 300 MG CAPSULE    take 2 capsules by mouth at bedtime for pain   IPRATROPIUM-ALBUTEROL (COMBIVENT RESPIMAT) 20-100 MCG/ACT AERS RESPIMAT    Inhale 2 puffs into the lungs every 6 (six) hours. Take 2 puffs 4 times daily to help with breathing.   LATANOPROST (XALATAN) 0.005 % OPHTHALMIC SOLUTION    Place 1 drop into both eyes at bedtime.     LETROZOLE (FEMARA) 2.5 MG TABLET    Take 1 tablet (2.5 mg total) by mouth at bedtime.   LEVETIRACETAM (KEPPRA) 500 MG TABLET    take 1 tablet by mouth twice a day   LEVOTHYROXINE (SYNTHROID, LEVOTHROID) 75 MCG TABLET    Take one tablet once a day for thyroid supplement   MEMANTINE HCL ER 28 MG CP24    One daily to help preserve memory   MIRABEGRON ER (MYRBETRIQ) 25 MG TB24 TABLET    Take 1 tablet (25 mg total) by mouth daily. Take one tablet at bedtime for bladder.   MULTIPLE VITAMINS-MINERALS (MULTIVITAMIN GUMMIES ADULT) CHEW    Chew by mouth. Chew two gummi es a day   NIACIN 250 MG TABLET     Take 250 mg by mouth 1 day or 1 dose. Take 1 tablet daily @@ 5:00 pm with  apple sauce   NIFEDICAL XL 30 MG 24 HR TABLET    take 1 tablet by mouth once daily for blood pressure   OMEPRAZOLE (PRILOSEC) 20 MG CAPSULE    Take 1 tablet every other day to reduce stomach acids.   PILOCARPINE (PILOCAR) 1 % OPHTHALMIC SOLUTION    Place 1 drop into both eyes 3 (three) times daily. Instill 1 drop four times daily into both eyes for glaucoma.   PROBIOTIC PRODUCT (ALIGN) 4 MG CAPS    Take by mouth. Take 1 capsule daily to help irritable colon. Only while on antibiotics.   TIOTROPIUM (SPIRIVA) 18 MCG INHALATION CAPSULE    Place 1 capsule (18 mcg total) into inhaler and inhale daily.   WARFARIN (COUMADIN) 5 MG TABLET    Take 1 tablet daily except 1/2 tablet on Mondays and Thursdays  Modified Medications   No medications on file  Discontinued Medications   No medications on file     Physical Exam: Filed Vitals:   08/15/13 1028  BP: 162/80  Pulse: 70  Temp: 98 F (36.7 C)  TempSrc: Oral  Weight: 169 lb 6.4 oz (76.839 kg)  SpO2: 90%  Physical Exam  Constitutional: No distress.  Frail black female  Eyes:  blind  Neurological: She is alert.  Skin:  In gluteal crease, has 1 cm open area of crease, visible tissue is pink, but exquisitely tender when buttocks is spread to look at area, no drainage, erythema or warmth surrounding    Labs reviewed: Basic Metabolic Panel:  Recent Labs  09/25/12 1444 10/13/12 1737 11/12/12 1357 02/09/13 1620 05/12/13 1404  NA 140  --  143  --  143  K 4.9  --  3.4*  --  3.6  CL 102  --  106  --   --   CO2 26  --  28  --  26  GLUCOSE 81  --  86  --  89  BUN 12  --  15.7  --  10.3  CREATININE 1.22*  --  1.4*  --  1.1  CALCIUM 9.2  --  9.1  --  9.7  TSH  --  140.900*  --  9.290*  --    Liver Function Tests:  Recent Labs  11/12/12 1357 05/12/13 1404  AST 28 18  ALT 17 10  ALKPHOS 63 82  BILITOT 0.48 0.42  PROT 7.6 7.6  ALBUMIN 3.5 3.4*   CBC:  Recent Labs  09/25/12 1444 11/12/12 1357 05/12/13 1404  WBC 6.7 6.1 7.3  NEUTROABS 2.6 2.9 3.4  HGB 14.4 12.3 13.3  HCT 41.6 37.7 42.1  MCV 73.8* 78.8* 79.2*  PLT 171 158 211   Lab Results  Component Value Date   HGBA1C 5.7 08/06/2007    Assessment/Plan Wound of gluteal cleft Cont wound cleanser, skin protectant cream, and neosporin to open gluteal crease wound  Labs/tests ordered: none Next appt:  Keep as scheduled

## 2013-08-17 ENCOUNTER — Other Ambulatory Visit: Payer: Self-pay | Admitting: *Deleted

## 2013-08-17 MED ORDER — FENTANYL 12 MCG/HR TD PT72: 12.5000 ug | MEDICATED_PATCH | TRANSDERMAL | Status: DC

## 2013-08-24 ENCOUNTER — Encounter: Payer: Self-pay | Admitting: Internal Medicine

## 2013-08-29 ENCOUNTER — Encounter: Payer: Self-pay | Admitting: Pharmacotherapy

## 2013-08-29 ENCOUNTER — Ambulatory Visit (INDEPENDENT_AMBULATORY_CARE_PROVIDER_SITE_OTHER): Payer: Medicare Other | Admitting: Pharmacotherapy

## 2013-08-29 VITALS — BP 122/80 | HR 60 | Temp 97.1°F | Resp 10

## 2013-08-29 DIAGNOSIS — I749 Embolism and thrombosis of unspecified artery: Secondary | ICD-10-CM

## 2013-08-29 DIAGNOSIS — Z7901 Long term (current) use of anticoagulants: Secondary | ICD-10-CM

## 2013-08-29 LAB — POCT INR: INR: 2.9

## 2013-08-29 NOTE — Patient Instructions (Signed)
INR 2.9 Continue Coumadin 5mg  daily except 2.5mg  on Mondays and Thursdays

## 2013-08-29 NOTE — Progress Notes (Signed)
Subjective:     Patient ID: Jennifer Brennan, female   DOB: 12-Jan-1924, 80 y.o.   MRN: [id number]  HPI Last INR was OK at 2.6 Current Coumadin dose is 5mg  QD except 2.5mg  M/Th Denies missed doses. Denies unusual bleeding or bruising.  Has minimal bleeding on gums after brushing teeth. Denies CP, SOB, falls. Consistent with vitamin K intake.   Review of Systems  HENT: Negative for mouth sores and nosebleeds.   Respiratory: Negative for shortness of breath.   Cardiovascular: Negative for chest pain.  Gastrointestinal: Negative for blood in stool and anal bleeding.  Genitourinary: Negative for hematuria.       Objective:   Physical Exam  Constitutional: She is oriented to person, place, and time. She appears well-developed and well-nourished.  HENT:  Right Ear: External ear normal.  Left Ear: External ear normal.  Eyes:  Blind   Cardiovascular: Normal rate, regular rhythm and normal heart sounds.   Pulmonary/Chest: Effort normal and breath sounds normal.  Neurological: She is alert and oriented to person, place, and time.  Skin: Skin is warm and dry.  Psychiatric: She has a normal mood and affect. Her behavior is normal.   BP:  122/80   HR:  60        Assessment:     INR 2.9     Plan:     1.  INR at goal 2-3 2.  Continue coumadin 5mg  QD except 2.5mg  M/Th 3.  INR in 1 month

## 2013-08-30 ENCOUNTER — Telehealth: Payer: Self-pay | Admitting: *Deleted

## 2013-08-30 NOTE — Telephone Encounter (Signed)
Patient daughter,Joyce, called and stated that she took the wrap off of patient's leg and there was alittle blood at the exact place of the contusion last night. She applied neosporin and nonstick guaze and wrapped it back up and this morning there was alittle pus and blood on the wrap. Not painful and not red.

## 2013-08-31 NOTE — Telephone Encounter (Signed)
Continue with Neosporin and non-stick bandage.

## 2013-08-31 NOTE — Telephone Encounter (Signed)
Patient daughter, Jennifer Brennan, Notified and Agreed

## 2013-09-18 ENCOUNTER — Emergency Department (HOSPITAL_COMMUNITY): Payer: Medicare Other

## 2013-09-18 ENCOUNTER — Encounter (HOSPITAL_COMMUNITY): Payer: Self-pay | Admitting: Emergency Medicine

## 2013-09-18 ENCOUNTER — Inpatient Hospital Stay (HOSPITAL_COMMUNITY)
Admission: EM | Admit: 2013-09-18 | Discharge: 2013-09-20 | DRG: 191 | Disposition: A | Discharge status: Home Health | Payer: Medicare Other | Attending: Internal Medicine | Admitting: Internal Medicine

## 2013-09-18 DIAGNOSIS — Z881 Allergy status to other antibiotic agents status: Secondary | ICD-10-CM

## 2013-09-18 DIAGNOSIS — R1030 Lower abdominal pain, unspecified: Secondary | ICD-10-CM

## 2013-09-18 DIAGNOSIS — I872 Venous insufficiency (chronic) (peripheral): Secondary | ICD-10-CM

## 2013-09-18 DIAGNOSIS — J45901 Unspecified asthma with (acute) exacerbation: Principal | ICD-10-CM

## 2013-09-18 DIAGNOSIS — M19049 Primary osteoarthritis, unspecified hand: Secondary | ICD-10-CM | POA: Diagnosis present

## 2013-09-18 DIAGNOSIS — E039 Hypothyroidism, unspecified: Secondary | ICD-10-CM | POA: Diagnosis present

## 2013-09-18 DIAGNOSIS — L97919 Non-pressure chronic ulcer of unspecified part of right lower leg with unspecified severity: Secondary | ICD-10-CM

## 2013-09-18 DIAGNOSIS — Z832 Family history of diseases of the blood and blood-forming organs and certain disorders involving the immune mechanism: Secondary | ICD-10-CM

## 2013-09-18 DIAGNOSIS — Z87891 Personal history of nicotine dependence: Secondary | ICD-10-CM

## 2013-09-18 DIAGNOSIS — I83019 Varicose veins of right lower extremity with ulcer of unspecified site: Secondary | ICD-10-CM

## 2013-09-18 DIAGNOSIS — Z8249 Family history of ischemic heart disease and other diseases of the circulatory system: Secondary | ICD-10-CM

## 2013-09-18 DIAGNOSIS — M79606 Pain in leg, unspecified: Secondary | ICD-10-CM

## 2013-09-18 DIAGNOSIS — Z8673 Personal history of transient ischemic attack (TIA), and cerebral infarction without residual deficits: Secondary | ICD-10-CM

## 2013-09-18 DIAGNOSIS — M949 Disorder of cartilage, unspecified: Secondary | ICD-10-CM

## 2013-09-18 DIAGNOSIS — T50995A Adverse effect of other drugs, medicaments and biological substances, initial encounter: Secondary | ICD-10-CM | POA: Diagnosis present

## 2013-09-18 DIAGNOSIS — Z853 Personal history of malignant neoplasm of breast: Secondary | ICD-10-CM

## 2013-09-18 DIAGNOSIS — L270 Generalized skin eruption due to drugs and medicaments taken internally: Secondary | ICD-10-CM | POA: Diagnosis present

## 2013-09-18 DIAGNOSIS — L8992 Pressure ulcer of unspecified site, stage 2: Secondary | ICD-10-CM

## 2013-09-18 DIAGNOSIS — Z7901 Long term (current) use of anticoagulants: Secondary | ICD-10-CM

## 2013-09-18 DIAGNOSIS — Z823 Family history of stroke: Secondary | ICD-10-CM

## 2013-09-18 DIAGNOSIS — H543 Unqualified visual loss, both eyes: Secondary | ICD-10-CM | POA: Diagnosis present

## 2013-09-18 DIAGNOSIS — M899 Disorder of bone, unspecified: Secondary | ICD-10-CM

## 2013-09-18 DIAGNOSIS — L249 Irritant contact dermatitis, unspecified cause: Secondary | ICD-10-CM

## 2013-09-18 DIAGNOSIS — H548 Legal blindness, as defined in USA: Secondary | ICD-10-CM | POA: Diagnosis present

## 2013-09-18 DIAGNOSIS — Z888 Allergy status to other drugs, medicaments and biological substances status: Secondary | ICD-10-CM

## 2013-09-18 DIAGNOSIS — K219 Gastro-esophageal reflux disease without esophagitis: Secondary | ICD-10-CM | POA: Diagnosis present

## 2013-09-18 DIAGNOSIS — R5383 Other fatigue: Secondary | ICD-10-CM

## 2013-09-18 DIAGNOSIS — G40909 Epilepsy, unspecified, not intractable, without status epilepticus: Secondary | ICD-10-CM | POA: Diagnosis present

## 2013-09-18 DIAGNOSIS — S31809A Unspecified open wound of unspecified buttock, initial encounter: Secondary | ICD-10-CM

## 2013-09-18 DIAGNOSIS — M545 Low back pain, unspecified: Secondary | ICD-10-CM

## 2013-09-18 DIAGNOSIS — Z872 Personal history of diseases of the skin and subcutaneous tissue: Secondary | ICD-10-CM

## 2013-09-18 DIAGNOSIS — I87009 Postthrombotic syndrome without complications of unspecified extremity: Secondary | ICD-10-CM

## 2013-09-18 DIAGNOSIS — R32 Unspecified urinary incontinence: Secondary | ICD-10-CM

## 2013-09-18 DIAGNOSIS — H6123 Impacted cerumen, bilateral: Secondary | ICD-10-CM

## 2013-09-18 DIAGNOSIS — Z808 Family history of malignant neoplasm of other organs or systems: Secondary | ICD-10-CM

## 2013-09-18 DIAGNOSIS — Z9181 History of falling: Secondary | ICD-10-CM

## 2013-09-18 DIAGNOSIS — Z8041 Family history of malignant neoplasm of ovary: Secondary | ICD-10-CM

## 2013-09-18 DIAGNOSIS — Z86718 Personal history of other venous thrombosis and embolism: Secondary | ICD-10-CM

## 2013-09-18 DIAGNOSIS — M7989 Other specified soft tissue disorders: Secondary | ICD-10-CM

## 2013-09-18 DIAGNOSIS — H3582 Retinal ischemia: Secondary | ICD-10-CM | POA: Diagnosis present

## 2013-09-18 DIAGNOSIS — E559 Vitamin D deficiency, unspecified: Secondary | ICD-10-CM | POA: Diagnosis present

## 2013-09-18 DIAGNOSIS — N318 Other neuromuscular dysfunction of bladder: Secondary | ICD-10-CM | POA: Diagnosis present

## 2013-09-18 DIAGNOSIS — I739 Peripheral vascular disease, unspecified: Secondary | ICD-10-CM

## 2013-09-18 DIAGNOSIS — F341 Dysthymic disorder: Secondary | ICD-10-CM | POA: Diagnosis present

## 2013-09-18 DIAGNOSIS — C50919 Malignant neoplasm of unspecified site of unspecified female breast: Secondary | ICD-10-CM | POA: Diagnosis present

## 2013-09-18 DIAGNOSIS — M810 Age-related osteoporosis without current pathological fracture: Secondary | ICD-10-CM

## 2013-09-18 DIAGNOSIS — G309 Alzheimer's disease, unspecified: Secondary | ICD-10-CM | POA: Diagnosis present

## 2013-09-18 DIAGNOSIS — Z79899 Other long term (current) drug therapy: Secondary | ICD-10-CM

## 2013-09-18 DIAGNOSIS — H409 Unspecified glaucoma: Secondary | ICD-10-CM | POA: Diagnosis present

## 2013-09-18 DIAGNOSIS — F0280 Dementia in other diseases classified elsewhere without behavioral disturbance: Secondary | ICD-10-CM | POA: Diagnosis present

## 2013-09-18 DIAGNOSIS — Z880 Allergy status to penicillin: Secondary | ICD-10-CM

## 2013-09-18 DIAGNOSIS — J45909 Unspecified asthma, uncomplicated: Secondary | ICD-10-CM | POA: Diagnosis present

## 2013-09-18 DIAGNOSIS — J441 Chronic obstructive pulmonary disease with (acute) exacerbation: Principal | ICD-10-CM | POA: Diagnosis present

## 2013-09-18 DIAGNOSIS — I10 Essential (primary) hypertension: Secondary | ICD-10-CM | POA: Diagnosis present

## 2013-09-18 DIAGNOSIS — L659 Nonscarring hair loss, unspecified: Secondary | ICD-10-CM

## 2013-09-18 DIAGNOSIS — Z8261 Family history of arthritis: Secondary | ICD-10-CM

## 2013-09-18 DIAGNOSIS — R21 Rash and other nonspecific skin eruption: Secondary | ICD-10-CM | POA: Diagnosis present

## 2013-09-18 DIAGNOSIS — Z96649 Presence of unspecified artificial hip joint: Secondary | ICD-10-CM

## 2013-09-18 DIAGNOSIS — R531 Weakness: Secondary | ICD-10-CM

## 2013-09-18 DIAGNOSIS — R569 Unspecified convulsions: Secondary | ICD-10-CM | POA: Diagnosis present

## 2013-09-18 DIAGNOSIS — Z9010 Acquired absence of unspecified breast and nipple: Secondary | ICD-10-CM

## 2013-09-18 DIAGNOSIS — Z801 Family history of malignant neoplasm of trachea, bronchus and lung: Secondary | ICD-10-CM

## 2013-09-18 DIAGNOSIS — M79661 Pain in right lower leg: Secondary | ICD-10-CM

## 2013-09-18 DIAGNOSIS — R509 Fever, unspecified: Secondary | ICD-10-CM | POA: Diagnosis present

## 2013-09-18 DIAGNOSIS — Z885 Allergy status to narcotic agent status: Secondary | ICD-10-CM

## 2013-09-18 DIAGNOSIS — R5381 Other malaise: Secondary | ICD-10-CM | POA: Diagnosis present

## 2013-09-18 HISTORY — DX: Unspecified asthma, uncomplicated: J45.909

## 2013-09-18 LAB — CBC WITH DIFFERENTIAL/PLATELET
Basophils Absolute: 0 10*3/uL (ref 0.0–0.1)
Basophils Relative: 0 % (ref 0–1)
Eosinophils Absolute: 0.3 10*3/uL (ref 0.0–0.7)
Eosinophils Relative: 6 % — ABNORMAL HIGH (ref 0–5)
HCT: 40.9 % (ref 36.0–46.0)
HEMOGLOBIN: 14.2 g/dL (ref 12.0–15.0)
LYMPHS ABS: 1.9 10*3/uL (ref 0.7–4.0)
Lymphocytes Relative: 35 % (ref 12–46)
MCH: 27.3 pg (ref 26.0–34.0)
MCHC: 34.7 g/dL (ref 30.0–36.0)
MCV: 78.7 fL (ref 78.0–100.0)
MONOS PCT: 8 % (ref 3–12)
Monocytes Absolute: 0.4 10*3/uL (ref 0.1–1.0)
NEUTROS ABS: 2.7 10*3/uL (ref 1.7–7.7)
Neutrophils Relative %: 51 % (ref 43–77)
Platelets: 190 10*3/uL (ref 150–400)
RBC: 5.2 MIL/uL — ABNORMAL HIGH (ref 3.87–5.11)
RDW: 15.7 % — ABNORMAL HIGH (ref 11.5–15.5)
WBC: 5.3 10*3/uL (ref 4.0–10.5)

## 2013-09-18 LAB — URINALYSIS, ROUTINE W REFLEX MICROSCOPIC
BILIRUBIN URINE: NEGATIVE
Glucose, UA: NEGATIVE mg/dL
Hgb urine dipstick: NEGATIVE
KETONES UR: NEGATIVE mg/dL
Leukocytes, UA: NEGATIVE
NITRITE: NEGATIVE
PH: 5.5 (ref 5.0–8.0)
Protein, ur: NEGATIVE mg/dL
Specific Gravity, Urine: 1.01 (ref 1.005–1.030)
Urobilinogen, UA: 0.2 mg/dL (ref 0.0–1.0)

## 2013-09-18 LAB — COMPREHENSIVE METABOLIC PANEL
ALT: 13 U/L (ref 0–35)
AST: 22 U/L (ref 0–37)
Albumin: 3.1 g/dL — ABNORMAL LOW (ref 3.5–5.2)
Alkaline Phosphatase: 69 U/L (ref 39–117)
BILIRUBIN TOTAL: 0.2 mg/dL — AB (ref 0.3–1.2)
BUN: 9 mg/dL (ref 6–23)
CHLORIDE: 104 meq/L (ref 96–112)
CO2: 25 meq/L (ref 19–32)
CREATININE: 1.08 mg/dL (ref 0.50–1.10)
Calcium: 8.7 mg/dL (ref 8.4–10.5)
GFR, EST AFRICAN AMERICAN: 52 mL/min — AB (ref >90)
GFR, EST NON AFRICAN AMERICAN: 45 mL/min — AB (ref >90)
GLUCOSE: 96 mg/dL (ref 70–99)
Potassium: 3.7 mEq/L (ref 3.7–5.3)
Sodium: 145 mEq/L (ref 137–147)
Total Protein: 6.8 g/dL (ref 6.0–8.3)

## 2013-09-18 LAB — TROPONIN I

## 2013-09-18 LAB — PRO B NATRIURETIC PEPTIDE: Pro B Natriuretic peptide (BNP): 177.3 pg/mL (ref 0–450)

## 2013-09-18 LAB — PROTIME-INR
INR: 1.93 — ABNORMAL HIGH (ref 0.00–1.49)
Prothrombin Time: 21.5 seconds — ABNORMAL HIGH (ref 11.6–15.2)

## 2013-09-18 MED ORDER — ACETAMINOPHEN 650 MG RE SUPP: 650.0000 mg | Freq: Four times a day (QID) | RECTAL | Status: DC | PRN

## 2013-09-18 MED ORDER — IPRATROPIUM-ALBUTEROL 0.5-2.5 (3) MG/3ML IN SOLN
3.0000 mL | Freq: Four times a day (QID) | RESPIRATORY_TRACT | Status: DC
  Administered 2013-09-19 (×4): 3 mL via RESPIRATORY_TRACT
  Filled 2013-09-18 (×5): qty 3

## 2013-09-18 MED ORDER — WARFARIN SODIUM 5 MG PO TABS
5.0000 mg | ORAL_TABLET | Freq: Once | ORAL | Status: AC
  Administered 2013-09-18: 5 mg via ORAL
  Filled 2013-09-18: qty 1

## 2013-09-18 MED ORDER — IPRATROPIUM-ALBUTEROL 0.5-2.5 (3) MG/3ML IN SOLN
3.0000 mL | RESPIRATORY_TRACT | Status: DC
  Administered 2013-09-18: 3 mL via RESPIRATORY_TRACT
  Filled 2013-09-18: qty 3

## 2013-09-18 MED ORDER — LEVOFLOXACIN 500 MG PO TABS
500.0000 mg | ORAL_TABLET | Freq: Once | ORAL | Status: AC
  Administered 2013-09-18: 500 mg via ORAL
  Filled 2013-09-18: qty 1

## 2013-09-18 MED ORDER — DIPHENHYDRAMINE HCL 12.5 MG/5ML PO ELIX
12.5000 mg | ORAL_SOLUTION | Freq: Three times a day (TID) | ORAL | Status: DC
  Administered 2013-09-18 – 2013-09-19 (×4): 12.5 mg via ORAL
  Filled 2013-09-18 (×7): qty 5

## 2013-09-18 MED ORDER — DORZOLAMIDE HCL-TIMOLOL MAL 2-0.5 % OP SOLN
1.0000 [drp] | Freq: Two times a day (BID) | OPHTHALMIC | Status: DC
  Administered 2013-09-18 – 2013-09-20 (×4): 1 [drp] via OPHTHALMIC
  Filled 2013-09-18: qty 10

## 2013-09-18 MED ORDER — LEVOTHYROXINE SODIUM 75 MCG PO TABS
75.0000 ug | ORAL_TABLET | Freq: Every day | ORAL | Status: DC
  Administered 2013-09-19 – 2013-09-20 (×2): 75 ug via ORAL
  Filled 2013-09-18 (×2): qty 1

## 2013-09-18 MED ORDER — PANTOPRAZOLE SODIUM 40 MG PO TBEC
40.0000 mg | DELAYED_RELEASE_TABLET | Freq: Every day | ORAL | Status: DC
  Administered 2013-09-18 – 2013-09-20 (×3): 40 mg via ORAL
  Filled 2013-09-18: qty 1

## 2013-09-18 MED ORDER — IPRATROPIUM BROMIDE 0.02 % IN SOLN
0.5000 mg | Freq: Once | RESPIRATORY_TRACT | Status: AC
  Administered 2013-09-18: 0.5 mg via RESPIRATORY_TRACT
  Filled 2013-09-18: qty 2.5

## 2013-09-18 MED ORDER — ALBUTEROL SULFATE (2.5 MG/3ML) 0.083% IN NEBU: 2.5000 mg | INHALATION_SOLUTION | RESPIRATORY_TRACT | Status: DC | PRN

## 2013-09-18 MED ORDER — MIRABEGRON ER 25 MG PO TB24
25.0000 mg | ORAL_TABLET | Freq: Every day | ORAL | Status: DC
  Administered 2013-09-19 – 2013-09-20 (×2): 25 mg via ORAL
  Filled 2013-09-18 (×2): qty 1

## 2013-09-18 MED ORDER — ALBUTEROL SULFATE (2.5 MG/3ML) 0.083% IN NEBU
5.0000 mg | INHALATION_SOLUTION | Freq: Once | RESPIRATORY_TRACT | Status: AC
  Administered 2013-09-18: 5 mg via RESPIRATORY_TRACT
  Filled 2013-09-18: qty 6

## 2013-09-18 MED ORDER — SODIUM CHLORIDE 0.9 % IV SOLN
INTRAVENOUS | Status: DC
Start: 2013-09-18 — End: 2013-09-19
  Administered 2013-09-18: 50 mL/h via INTRAVENOUS

## 2013-09-18 MED ORDER — WARFARIN - PHARMACIST DOSING INPATIENT: Freq: Every day | Status: DC

## 2013-09-18 MED ORDER — MEMANTINE HCL ER 28 MG PO CP24
28.0000 mg | ORAL_CAPSULE | Freq: Every day | ORAL | Status: DC
  Administered 2013-09-19 – 2013-09-20 (×2): 28 mg via ORAL
  Filled 2013-09-18 (×2): qty 28

## 2013-09-18 MED ORDER — LATANOPROST 0.005 % OP SOLN
1.0000 [drp] | Freq: Every day | OPHTHALMIC | Status: DC
  Administered 2013-09-18 – 2013-09-19 (×2): 1 [drp] via OPHTHALMIC
  Filled 2013-09-18: qty 2.5

## 2013-09-18 MED ORDER — LETROZOLE 2.5 MG PO TABS
2.5000 mg | ORAL_TABLET | Freq: Every day | ORAL | Status: DC
  Administered 2013-09-18 – 2013-09-19 (×2): 2.5 mg via ORAL
  Filled 2013-09-18 (×3): qty 1

## 2013-09-18 MED ORDER — PILOCARPINE HCL 1 % OP SOLN
1.0000 [drp] | Freq: Three times a day (TID) | OPHTHALMIC | Status: DC
  Administered 2013-09-18 – 2013-09-20 (×5): 1 [drp] via OPHTHALMIC
  Filled 2013-09-18: qty 15

## 2013-09-18 MED ORDER — BRIMONIDINE TARTRATE 0.2 % OP SOLN
1.0000 [drp] | Freq: Two times a day (BID) | OPHTHALMIC | Status: DC
  Administered 2013-09-18 – 2013-09-20 (×4): 1 [drp] via OPHTHALMIC
  Filled 2013-09-18: qty 5

## 2013-09-18 MED ORDER — ACETAMINOPHEN 325 MG PO TABS
650.0000 mg | ORAL_TABLET | Freq: Once | ORAL | Status: AC
  Administered 2013-09-18: 650 mg via ORAL
  Filled 2013-09-18: qty 2

## 2013-09-18 MED ORDER — ACETAMINOPHEN 325 MG PO TABS
650.0000 mg | ORAL_TABLET | Freq: Four times a day (QID) | ORAL | Status: DC | PRN
  Administered 2013-09-19: 650 mg via ORAL
  Filled 2013-09-18: qty 2

## 2013-09-18 MED ORDER — LEVOFLOXACIN 250 MG PO TABS
250.0000 mg | ORAL_TABLET | Freq: Every day | ORAL | Status: DC
  Administered 2013-09-19: 250 mg via ORAL
  Filled 2013-09-18 (×2): qty 1

## 2013-09-18 MED ORDER — NIFEDIPINE ER 30 MG PO TB24
30.0000 mg | ORAL_TABLET | Freq: Every day | ORAL | Status: DC
  Administered 2013-09-19 – 2013-09-20 (×2): 30 mg via ORAL
  Filled 2013-09-18 (×2): qty 1

## 2013-09-18 MED ORDER — METHYLPREDNISOLONE SODIUM SUCC 40 MG IJ SOLR
40.0000 mg | Freq: Three times a day (TID) | INTRAMUSCULAR | Status: DC
  Administered 2013-09-18 – 2013-09-20 (×5): 40 mg via INTRAVENOUS
  Filled 2013-09-18 (×8): qty 1

## 2013-09-18 MED ORDER — FAMOTIDINE 20 MG PO TABS
20.0000 mg | ORAL_TABLET | Freq: Two times a day (BID) | ORAL | Status: DC
  Administered 2013-09-18 – 2013-09-20 (×4): 20 mg via ORAL
  Filled 2013-09-18 (×5): qty 1

## 2013-09-18 MED ORDER — DIPHENHYDRAMINE HCL 50 MG/ML IJ SOLN
12.5000 mg | INTRAMUSCULAR | Status: AC
  Administered 2013-09-18: 12.5 mg via INTRAVENOUS
  Filled 2013-09-18: qty 1

## 2013-09-18 MED ORDER — GABAPENTIN 300 MG PO CAPS
300.0000 mg | ORAL_CAPSULE | Freq: Every day | ORAL | Status: DC
  Administered 2013-09-18 – 2013-09-19 (×2): 300 mg via ORAL
  Filled 2013-09-18 (×3): qty 1

## 2013-09-18 MED ORDER — FENTANYL 12 MCG/HR TD PT72
12.5000 ug | MEDICATED_PATCH | TRANSDERMAL | Status: DC
  Administered 2013-09-18: 12.5 ug via TRANSDERMAL
  Filled 2013-09-18: qty 1

## 2013-09-18 MED ORDER — ONDANSETRON HCL 4 MG/2ML IJ SOLN: 4.0000 mg | Freq: Four times a day (QID) | INTRAMUSCULAR | Status: DC | PRN

## 2013-09-18 MED ORDER — ONDANSETRON HCL 4 MG PO TABS: 4.0000 mg | ORAL_TABLET | Freq: Four times a day (QID) | ORAL | Status: DC | PRN

## 2013-09-18 MED ORDER — LEVETIRACETAM 500 MG PO TABS
500.0000 mg | ORAL_TABLET | Freq: Two times a day (BID) | ORAL | Status: DC
  Administered 2013-09-18 – 2013-09-20 (×4): 500 mg via ORAL
  Filled 2013-09-18 (×5): qty 1

## 2013-09-18 MED ORDER — GUAIFENESIN-DM 100-10 MG/5ML PO SYRP
5.0000 mL | ORAL_SOLUTION | ORAL | Status: DC | PRN
Start: 2013-09-18 — End: 2013-09-20
  Filled 2013-09-18: qty 5

## 2013-09-18 NOTE — ED Notes (Signed)
Pt presents from home via [facility] with c/o of a fever that spiked around 11:30am with a 98.9 and with EMS 100.6.  Pt took 325mg  Tylenol at 11:30am, had expiratory wheezing present.  EMS gave 2 Albuterol / Atrovent in route to hospital.  Pt daughter states a cough began 4 days ago that has progressively persisted.  Yesterday given 2 12hour Mucinex.  Pt is legally blind.    In the ED pt has RA at 89%, placed on 2L Nasal Cannula with O2 of 94%.

## 2013-09-18 NOTE — ED Notes (Signed)
Pt off the floor to Xray

## 2013-09-18 NOTE — Progress Notes (Signed)
ANTICOAGULATION CONSULT NOTE - Initial Consult  Pharmacy Consult for levaquin/coumadin Indication: DVT/bronchitis  Allergies  Allergen Reactions  . Bactrim [Sulfamethoxazole-Trimethoprim] Other (See Comments)    MD stopped due to kidney failure Also causes lips to swell  . Furosemide Other (See Comments)    MD stopped due to kidney failure  . Percocet [Oxycodone-Acetaminophen] Other (See Comments)    Causes SEIZURES  . Valsartan Other (See Comments)    MD stopped due to kidney failure  . Doxycycline Swelling    Causes lips to swell, wheezing  . Penicillins Hives  . Morphine Sulfate Itching and Rash  . Sertraline Hcl Rash    Patient Measurements:   Heparin Dosing Weight:   Vital Signs: Temp: 98.7 F (37.1 C) (03/29 2114) Temp src: Oral (03/29 2114) BP: 137/65 mmHg (03/29 2129) Pulse Rate: 79 (03/29 2114)  Labs:  Recent Labs  09/18/13 1614 09/18/13 1937  HGB 14.2  --   HCT 40.9  --   PLT 190  --   LABPROT  --  21.5*  INR  --  1.93*  CREATININE 1.08  --   TROPONINI <0.30  --     The CrCl is unknown because both a height and weight (above a minimum accepted value) are required for this calculation.   Medical History: Past Medical History  Diagnosis Date  . Hypertension   . Hypothyroidism   . Seizure disorder   . History of cerebrovascular accident   . Severe stage glaucoma     legally blind  . Depression with anxiety   . OAB (overactive bladder)   . ADENOCARCINOMA, LEFT BREAST 04/11/2010    s/p L mastectomy, on femara x 6yr  . BACK PAIN, LUMBAR, CHRONIC   . DVT 05/2007    RLE following R THR - chronic coumadin, chronic RLE pain  . GLAUCOMA   . OSTEOARTHRITIS, HANDS, BILATERAL   . OSTEOPENIA   . Retinal ischemia   . Sciatica of right side     chronic RLE pain, multifactorial - MRI T/L spine 02/2011  . VITAMIN D DEFICIENCY dx 08/2008  . Venous ulcer of right lower extremity with varicose veins   . Dementia   . Vulvar abscess   . Unspecified  hypothyroidism   . Unspecified vitamin D deficiency   . Unspecified urinary incontinence   . Renal disorder   . Varicose veins of lower extremity   . Varicose veins of lower extremities with ulcer   . Unspecified disorder of kidney and ureter   . Stevens-Johnson syndrome   . Sebaceous cyst   . Osteoarthrosis, unspecified whether generalized or localized, unspecified site   . Disorder of bone and cartilage, unspecified   . Contusion of lower leg   . Cancer   . Depression   . Seizures   . Stroke   . Osteoporosis, unspecified 05/12/2013  . Acute asthmatic bronchitis 09/18/2013    Medications:  Prescriptions prior to admission  Medication Sig Dispense Refill  . acetaminophen (TYLENOL) 500 MG tablet Take 1,000 mg by mouth every 6 (six) hours as needed. pain      . albuterol (PROVENTIL HFA;VENTOLIN HFA) 108 (90 BASE) MCG/ACT inhaler Inhale 2 puffs into the lungs every 6 (six) hours as needed. For shortness of breath.      . brimonidine (ALPHAGAN) 0.15 % ophthalmic solution Place 1 drop into both eyes 2 (two) times daily.      . Cholecalciferol 2000 UNITS TABS Take 1 tablet by mouth every morning.      Marland Kitchen  Cranberry-Vitamin C-Vitamin E 4200-20-3 MG-MG-UNIT CAPS Take by mouth. Take one tablet once a day      . dextromethorphan (DELSYM) 30 MG/5ML liquid Take 60 mg by mouth at bedtime as needed for cough.      . Dextromethorphan-Guaifenesin (MUCINEX DM PO) Take 1 tablet by mouth 2 (two) times daily as needed (congestion, cough).      . diphenhydrAMINE (BENADRYL) 25 MG tablet Take 12.5 mg by mouth 2 (two) times daily as needed. Pt took while on antibiotics      . docusate sodium (COLACE) 100 MG capsule Take 100 mg by mouth 2 (two) times daily. Take one tablet twice a day as needed for  Constipation      . dorzolamide-timolol (COSOPT) 22.3-6.8 MG/ML ophthalmic solution Place 1 drop into both eyes 2 (two) times daily.        . fentaNYL (DURAGESIC - DOSED MCG/HR) 12 MCG/HR Place 12.5 mcg onto the  skin every 3 (three) days.      . Fluticasone-Salmeterol (ADVAIR) 250-50 MCG/DOSE AEPB Inhale 1 puff into the lungs 2 (two) times daily as needed. For shortness of breath      . gabapentin (NEURONTIN) 300 MG capsule take 2 capsules by mouth at bedtime for pain      . Ipratropium-Albuterol (COMBIVENT RESPIMAT) 20-100 MCG/ACT AERS respimat Inhale 2 puffs into the lungs every 6 (six) hours. Take 2 puffs 4 times daily to help with breathing.      . latanoprost (XALATAN) 0.005 % ophthalmic solution Place 1 drop into both eyes at bedtime.        Marland Kitchen letrozole (FEMARA) 2.5 MG tablet Take 2.5 mg by mouth at bedtime.      . levETIRAcetam (KEPPRA) 500 MG tablet Take 500 mg by mouth 2 (two) times daily.      . Memantine HCl ER 28 MG CP24 Take 28 mg by mouth daily. One daily to help preserve memory      . mirabegron ER (MYRBETRIQ) 25 MG TB24 tablet Take 1 tablet (25 mg total) by mouth daily. Take one tablet at bedtime for bladder.  30 tablet  5  . Multiple Vitamins-Minerals (MULTIVITAMIN GUMMIES ADULT) CHEW Chew by mouth. Chew two gummi es a day      . niacin 250 MG tablet Take 250 mg by mouth daily. Take 1 tablet daily @@ 5:00 pm with apple sauce      . NIFEdipine (NIFEDICAL XL) 30 MG 24 hr tablet Take 30 mg by mouth daily.      Marland Kitchen omeprazole (PRILOSEC) 20 MG capsule Take 20 mg by mouth every other day. Take 1 tablet every other day to reduce stomach acids.      . pilocarpine (PILOCAR) 1 % ophthalmic solution Place 1 drop into both eyes 3 (three) times daily.       . Probiotic Product (ALIGN) 4 MG CAPS Take by mouth. Take 1 capsule daily to help irritable colon. Only while on antibiotics.      Marland Kitchen tiotropium (SPIRIVA) 18 MCG inhalation capsule Place 1 capsule (18 mcg total) into inhaler and inhale daily.  30 capsule  5  . warfarin (COUMADIN) 5 MG tablet Take 2.5-5 mg by mouth daily. Take 1 tablet daily except 1/2 tablet on Mondays and Thursdays      . levothyroxine (SYNTHROID, LEVOTHROID) 75 MCG tablet Take 75 mcg  by mouth daily. Take one tablet once a day for thyroid supplement       Scheduled:  . brimonidine  1 drop  Both Eyes BID  . diphenhydrAMINE  12.5 mg Oral TID  . dorzolamide-timolol  1 drop Both Eyes BID  . famotidine  20 mg Oral BID  . fentaNYL  12.5 mcg Transdermal Q72H  . gabapentin  300 mg Oral QHS  . ipratropium-albuterol  3 mL Nebulization Q4H  . latanoprost  1 drop Both Eyes QHS  . letrozole  2.5 mg Oral QHS  . levETIRAcetam  500 mg Oral BID  . [START ON 09/19/2013] levothyroxine  75 mcg Oral Daily  . [START ON 09/19/2013] Memantine HCl ER  28 mg Oral Daily  . methylPREDNISolone (SOLU-MEDROL) injection  40 mg Intravenous 3 times per day  . [START ON 09/19/2013] mirabegron ER  25 mg Oral Daily  . [START ON 09/19/2013] NIFEdipine  30 mg Oral Daily  . pantoprazole  40 mg Oral Daily  . pilocarpine  1 drop Both Eyes TID   Infusions:  . sodium chloride      Assessment: 80 yo who was admitted for bronchitis. She has been on coumadin for a hx DVT. Her admit INR is 1.93. Coumadin is to be cont here. Levaquin has also been order for the bronchitis. Pt has not gotten the dose today.   PTA = 5mg  qday except 2.5 MTh  Goal of Therapy:  INR 2-3 Monitor platelets by anticoagulation protocol: Yes   Plan:   Coumadin coumadin 5mg  po x1 Dailly INR Levaquin 500mg   x1 then 250mg  PO qday

## 2013-09-18 NOTE — ED Notes (Signed)
Respiratory tech notified of pt respiratory issue.

## 2013-09-18 NOTE — ED Provider Notes (Signed)
CSN: [id number]     Arrival date & time 09/18/13  1328 History   First MD Initiated Contact with Patient 09/18/13 1457     Chief Complaint  Patient presents with  . Fever  . Cough  . Shortness of Breath  . Weakness     (Consider location/radiation/quality/duration/timing/severity/associated sxs/prior Treatment) HPI Comments: An 80 year old female with history of hypertension, blindness, breast cancer. She presents today with complaints of chest congestion, cough, shortness of breath for the past 5 days. She's had low-grade fever at home since this morning. She denies any chest pain. Daughter also notices a red rash to both legs. She states the last time she had this she had "some sort of infection".  Patient primary care physician is Jeanmarie Hubert with [facility].  Patient is a 80 y.o. female presenting with fever, cough, shortness of breath, and weakness. The history is provided by the patient.  Fever Max temp prior to arrival:  102 Temp source:  Oral Severity:  Moderate Onset quality:  Sudden Duration:  5 days Timing:  Constant Progression:  Worsening Chronicity:  New Relieved by:  Nothing Worsened by:  Nothing tried Ineffective treatments:  None tried Associated symptoms: chills, congestion and cough   Associated symptoms: no confusion   Cough Associated symptoms: chills, fever and shortness of breath   Shortness of Breath Associated symptoms: cough and fever   Weakness Associated symptoms include shortness of breath.    Past Medical History  Diagnosis Date  . Hypertension   . Hypothyroidism   . Seizure disorder   . History of cerebrovascular accident   . Severe stage glaucoma     legally blind  . Depression with anxiety   . OAB (overactive bladder)   . ADENOCARCINOMA, LEFT BREAST 04/11/2010    s/p L mastectomy, on femara x 66yr  . BACK PAIN, LUMBAR, CHRONIC   . DVT 05/2007    RLE following R THR - chronic coumadin, chronic RLE pain  . GLAUCOMA   .  OSTEOARTHRITIS, HANDS, BILATERAL   . OSTEOPENIA   . Retinal ischemia   . Sciatica of right side     chronic RLE pain, multifactorial - MRI T/L spine 02/2011  . VITAMIN D DEFICIENCY dx 08/2008  . Venous ulcer of right lower extremity with varicose veins   . Dementia   . Vulvar abscess   . Unspecified hypothyroidism   . Unspecified vitamin D deficiency   . Unspecified urinary incontinence   . Renal disorder   . Varicose veins of lower extremity   . Varicose veins of lower extremities with ulcer   . Unspecified disorder of kidney and ureter   . Stevens-Johnson syndrome   . Sebaceous cyst   . Osteoarthrosis, unspecified whether generalized or localized, unspecified site   . Disorder of bone and cartilage, unspecified   . Contusion of lower leg   . Cancer   . Depression   . Seizures   . Stroke   . Osteoporosis, unspecified 05/12/2013   Past Surgical History  Procedure Laterality Date  . Total abdominal hysterectomy    . Total hip arthroplasty  10/2006    right hip  . Refractive surgery      B/L  . Breast surgery  03/2010    breast biopsy, L mastectomy   . Tonsillectomy     Family History  Problem Relation Age of Onset  . Ovarian cancer Mother   . Cancer Mother   . Deep vein thrombosis Mother   .  Heart disease Mother   . Arthritis Other     grandparents  . Lung cancer Other   . Heart disease Other     parent  . Cancer Father     BRAIN TUMOR  . Cancer Brother   . Hyperlipidemia Brother   . Hypertension Brother   . Stroke Brother   . Arthritis Brother   . Sickle cell anemia Daughter   . Heart disease Daughter   . Sickle cell anemia Daughter    History  Substance Use Topics  . Smoking status: Former Smoker -- 45 years    Types: Cigarettes    Quit date: 10/16/1979  . Smokeless tobacco: Never Used  . Alcohol Use: No   OB History   Grav Para Term Preterm Abortions TAB SAB Ect Mult Living                 Review of Systems  Constitutional: Positive for fever  and chills.  HENT: Positive for congestion.   Respiratory: Positive for cough and shortness of breath.   Neurological: Positive for weakness.  Psychiatric/Behavioral: Negative for confusion.  All other systems reviewed and are negative.      Allergies  Bactrim; Furosemide; Percocet; Valsartan; Doxycycline; Penicillins; Morphine sulfate; and Sertraline hcl  Home Medications   Current Outpatient Rx  Name  Route  Sig  Dispense  Refill  . acetaminophen (TYLENOL) 500 MG tablet   Oral   Take 1,000 mg by mouth every 6 (six) hours as needed. pain         . albuterol (PROVENTIL HFA;VENTOLIN HFA) 108 (90 BASE) MCG/ACT inhaler   Inhalation   Inhale 2 puffs into the lungs every 6 (six) hours as needed. For shortness of breath.         . brimonidine (ALPHAGAN) 0.15 % ophthalmic solution   Both Eyes   Place 1 drop into both eyes 2 (two) times daily.         . Cholecalciferol 2000 UNITS TABS   Oral   Take 1 tablet by mouth every morning.         . Cranberry-Vitamin C-Vitamin E 4200-20-3 MG-MG-UNIT CAPS   Oral   Take by mouth. Take one tablet once a day         . dextromethorphan (DELSYM) 30 MG/5ML liquid   Oral   Take 60 mg by mouth at bedtime as needed for cough.         . Dextromethorphan-Guaifenesin (MUCINEX DM PO)   Oral   Take 1 tablet by mouth 2 (two) times daily as needed (congestion, cough).         . diphenhydrAMINE (BENADRYL) 25 MG tablet   Oral   Take 12.5 mg by mouth 2 (two) times daily as needed. Pt took while on antibiotics         . docusate sodium (COLACE) 100 MG capsule   Oral   Take 100 mg by mouth 2 (two) times daily. Take one tablet twice a day as needed for  Constipation         . dorzolamide-timolol (COSOPT) 22.3-6.8 MG/ML ophthalmic solution   Both Eyes   Place 1 drop into both eyes 2 (two) times daily.           . fentaNYL (DURAGESIC - DOSED MCG/HR) 12 MCG/HR   Transdermal   Place 12.5 mcg onto the skin every 3 (three) days.          . Fluticasone-Salmeterol (ADVAIR) 250-50 MCG/DOSE AEPB  Inhalation   Inhale 1 puff into the lungs 2 (two) times daily as needed. For shortness of breath         . gabapentin (NEURONTIN) 300 MG capsule      take 2 capsules by mouth at bedtime for pain         . Ipratropium-Albuterol (COMBIVENT RESPIMAT) 20-100 MCG/ACT AERS respimat   Inhalation   Inhale 2 puffs into the lungs every 6 (six) hours. Take 2 puffs 4 times daily to help with breathing.         . latanoprost (XALATAN) 0.005 % ophthalmic solution   Both Eyes   Place 1 drop into both eyes at bedtime.           Marland Kitchen letrozole (FEMARA) 2.5 MG tablet   Oral   Take 2.5 mg by mouth at bedtime.         . levETIRAcetam (KEPPRA) 500 MG tablet   Oral   Take 500 mg by mouth 2 (two) times daily.         . Memantine HCl ER 28 MG CP24   Oral   Take 28 mg by mouth daily. One daily to help preserve memory         . mirabegron ER (MYRBETRIQ) 25 MG TB24 tablet   Oral   Take 1 tablet (25 mg total) by mouth daily. Take one tablet at bedtime for bladder.   30 tablet   5     HOLD UNTIL PATIENT REQUEST   . Multiple Vitamins-Minerals (MULTIVITAMIN GUMMIES ADULT) CHEW   Oral   Chew by mouth. Chew two gummi es a day         . niacin 250 MG tablet   Oral   Take 250 mg by mouth daily. Take 1 tablet daily @@ 5:00 pm with apple sauce         . NIFEdipine (NIFEDICAL XL) 30 MG 24 hr tablet   Oral   Take 30 mg by mouth daily.         Marland Kitchen omeprazole (PRILOSEC) 20 MG capsule   Oral   Take 20 mg by mouth every other day. Take 1 tablet every other day to reduce stomach acids.         . pilocarpine (PILOCAR) 1 % ophthalmic solution   Both Eyes   Place 1 drop into both eyes 3 (three) times daily.          . Probiotic Product (ALIGN) 4 MG CAPS   Oral   Take by mouth. Take 1 capsule daily to help irritable colon. Only while on antibiotics.         Marland Kitchen tiotropium (SPIRIVA) 18 MCG inhalation capsule    Inhalation   Place 1 capsule (18 mcg total) into inhaler and inhale daily.   30 capsule   5     HOLD UNTIL PATIENT REQUEST   . warfarin (COUMADIN) 5 MG tablet   Oral   Take 2.5-5 mg by mouth daily. Take 1 tablet daily except 1/2 tablet on Mondays and Thursdays         . levothyroxine (SYNTHROID, LEVOTHROID) 75 MCG tablet   Oral   Take 75 mcg by mouth daily. Take one tablet once a day for thyroid supplement          BP 130/63  Pulse 77  Temp(Src) 99.2 F (37.3 C) (Rectal)  Resp 15  SpO2 94% Physical Exam  Nursing note and vitals reviewed. Constitutional: She is oriented to person, place,  and time. She appears well-developed and well-nourished. No distress.  HENT:  Head: Normocephalic and atraumatic.  Neck: Normal range of motion. Neck supple.  Cardiovascular: Normal rate and regular rhythm.  Exam reveals no gallop and no friction rub.   No murmur heard. Pulmonary/Chest: Effort normal. No respiratory distress. She has wheezes. She has no rales. She exhibits no tenderness.  There are bilateral rhonchi present.  Abdominal: Soft. Bowel sounds are normal. She exhibits no distension. There is no tenderness.  Musculoskeletal: Normal range of motion.  Neurological: She is alert and oriented to person, place, and time.  Skin: Skin is warm and dry. She is not diaphoretic.  There is a warm to touch, erythematous rash present on both legs extending to the lower back.    ED Course  Procedures (including critical care time) Labs Review Labs Reviewed  CBC WITH DIFFERENTIAL  COMPREHENSIVE METABOLIC PANEL  PRO B NATRIURETIC PEPTIDE  TROPONIN I  URINALYSIS, ROUTINE W REFLEX MICROSCOPIC   Imaging Review No results found.   EKG Interpretation   Date/Time:  Sunday September 18 2013 15:31:42 EDT Ventricular Rate:  89 PR Interval:  208 QRS Duration: 86 QT Interval:  393 QTC Calculation: 478 R Axis:   23 Text Interpretation:  Sinus rhythm Borderline repolarization abnormality   Baseline wander in lead(s) V2 V3 V4 V5 V6 No change from prior ekg.  Confirmed by Beau Fanny  MD, Firmin Belisle ([id number]) on 09/18/2013 7:33:20 PM      MDM   Final diagnoses:  None    Patient is an 80 year old female for evaluation of fever, chest congestion, and cough. She is also been weak and fatigued for the past several days. On exam she has rhonchorous breath sounds and oxygen saturations off of oxygen fall into the 80s. There is no fever in the ER her head she has no white count, however clinically this appears to be pneumonia. Due to hypoxia I feel as though admission is indicated. Spoke with Dr. Sloan Leiter who agrees to admit. Also of note is that the patient has a generalized red rash on her legs and now extending into her torso. I am uncertain as to the etiology of this, however she does have a history of Stevens-Johnson in the past and feel as though admission for observation of this is in her best interest.    Veryl Speak, MD 09/18/13 [phone]

## 2013-09-18 NOTE — H&P (Signed)
PATIENT DETAILS Name: Jennifer Brennan Age: 80 y.o. Sex: female Date of Birth: 13-May-1924 Admit Date: 09/18/2013 PW:[id number], [name], MD   CHIEF COMPLAINT:  Shortness of breath, cough for 4 days Rash-today  HPI: Jennifer Brennan is a 80 y.o. female with a Past Medical History of COPD/asthma, seizure disorder, dementia, history of DVT on chronic Coumadin therapy who presents today with the above noted complaint. Please note patient is a poor historian, most of this history is obtained from the patient's husband and daughter at bedside. Per the history obtained, for the past 4 days patient has had this cough which is mostly productive with yellowish phlegm. She's also had some mild worsening of shortness of breath, family describe wheezing. Today, they noticed a macular papular rash on her lower extremity that is intensely pruritic. Throughout the course of the day, the rash has ascended and is involved dorsal and her bilateral upper extremities. She was then brought to the emergency room for further evaluation and treatment, I was asked to admit this patient for further evaluation. The daughter claims that the patient was in her usual state of health until she got sick around 4 days ago. Because of the persistent cough, patient was started on Mucinex and Delsym yesterday. There has been no recent other changes to her medication regimen. There is no history of headache, chest pain, nausea, vomiting or abdominal pain. Per family, she has had low-grade fever around 90F at home.  ALLERGIES:   Allergies  Allergen Reactions  . Bactrim [Sulfamethoxazole-Trimethoprim] Other (See Comments)    MD stopped due to kidney failure Also causes lips to swell  . Furosemide Other (See Comments)    MD stopped due to kidney failure  . Percocet [Oxycodone-Acetaminophen] Other (See Comments)    Causes SEIZURES  . Valsartan Other (See Comments)    MD stopped due to kidney failure  . Doxycycline  Swelling    Causes lips to swell, wheezing  . Penicillins Hives  . Morphine Sulfate Itching and Rash  . Sertraline Hcl Rash    PAST MEDICAL HISTORY: Past Medical History  Diagnosis Date  . Hypertension   . Hypothyroidism   . Seizure disorder   . History of cerebrovascular accident   . Severe stage glaucoma     legally blind  . Depression with anxiety   . OAB (overactive bladder)   . ADENOCARCINOMA, LEFT BREAST 04/11/2010    s/p L mastectomy, on femara x 28yr  . BACK PAIN, LUMBAR, CHRONIC   . DVT 05/2007    RLE following R THR - chronic coumadin, chronic RLE pain  . GLAUCOMA   . OSTEOARTHRITIS, HANDS, BILATERAL   . OSTEOPENIA   . Retinal ischemia   . Sciatica of right side     chronic RLE pain, multifactorial - MRI T/L spine 02/2011  . VITAMIN D DEFICIENCY dx 08/2008  . Venous ulcer of right lower extremity with varicose veins   . Dementia   . Vulvar abscess   . Unspecified hypothyroidism   . Unspecified vitamin D deficiency   . Unspecified urinary incontinence   . Renal disorder   . Varicose veins of lower extremity   . Varicose veins of lower extremities with ulcer   . Unspecified disorder of kidney and ureter   . Stevens-Johnson syndrome   . Sebaceous cyst   . Osteoarthrosis, unspecified whether generalized or localized, unspecified site   . Disorder of bone and cartilage, unspecified   .  Contusion of lower leg   . Cancer   . Depression   . Seizures   . Stroke   . Osteoporosis, unspecified 05/12/2013  . Acute asthmatic bronchitis 09/18/2013    PAST SURGICAL HISTORY: Past Surgical History  Procedure Laterality Date  . Total abdominal hysterectomy    . Total hip arthroplasty  10/2006    right hip  . Refractive surgery      B/L  . Breast surgery  03/2010    breast biopsy, L mastectomy   . Tonsillectomy      MEDICATIONS AT HOME: Prior to Admission medications   Medication Sig Start Date End Date Taking? Authorizing Provider  acetaminophen (TYLENOL) 500 MG  tablet Take 1,000 mg by mouth every 6 (six) hours as needed. pain   Yes Historical Provider, MD  albuterol (PROVENTIL HFA;VENTOLIN HFA) 108 (90 BASE) MCG/ACT inhaler Inhale 2 puffs into the lungs every 6 (six) hours as needed. For shortness of breath. 01/14/12 06/23/97 Yes Rowe Clack, MD  brimonidine (ALPHAGAN) 0.15 % ophthalmic solution Place 1 drop into both eyes 2 (two) times daily.   Yes Historical Provider, MD  Cholecalciferol 2000 UNITS TABS Take 1 tablet by mouth every morning.   Yes Historical Provider, MD  Cranberry-Vitamin C-Vitamin E 4200-20-3 MG-MG-UNIT CAPS Take by mouth. Take one tablet once a day   Yes Historical Provider, MD  dextromethorphan (DELSYM) 30 MG/5ML liquid Take 60 mg by mouth at bedtime as needed for cough.   Yes Historical Provider, MD  Dextromethorphan-Guaifenesin [facility] DM PO) Take 1 tablet by mouth 2 (two) times daily as needed (congestion, cough).   Yes Historical Provider, MD  diphenhydrAMINE (BENADRYL) 25 MG tablet Take 12.5 mg by mouth 2 (two) times daily as needed. Pt took while on antibiotics   Yes Historical Provider, MD  docusate sodium (COLACE) 100 MG capsule Take 100 mg by mouth 2 (two) times daily. Take one tablet twice a day as needed for  Constipation   Yes Historical Provider, MD  dorzolamide-timolol (COSOPT) 22.3-6.8 MG/ML ophthalmic solution Place 1 drop into both eyes 2 (two) times daily.     Yes Historical Provider, MD  fentaNYL (DURAGESIC - DOSED MCG/HR) 12 MCG/HR Place 12.5 mcg onto the skin every 3 (three) days. 08/17/13  Yes Pricilla Larsson, NP  Fluticasone-Salmeterol (ADVAIR) 250-50 MCG/DOSE AEPB Inhale 1 puff into the lungs 2 (two) times daily as needed. For shortness of breath 02/14/12  Yes Robbie Lis, MD  gabapentin (NEURONTIN) 300 MG capsule take 2 capsules by mouth at bedtime for pain 06/16/13  Yes Estill Dooms, MD  Ipratropium-Albuterol (COMBIVENT RESPIMAT) 20-100 MCG/ACT AERS respimat Inhale 2 puffs into the lungs every 6 (six)  hours. Take 2 puffs 4 times daily to help with breathing.   Yes Historical Provider, MD  latanoprost (XALATAN) 0.005 % ophthalmic solution Place 1 drop into both eyes at bedtime.     Yes Historical Provider, MD  letrozole (FEMARA) 2.5 MG tablet Take 2.5 mg by mouth at bedtime. 05/09/13  Yes Amy Milda Smart, PA-C  levETIRAcetam (KEPPRA) 500 MG tablet Take 500 mg by mouth 2 (two) times daily.   Yes Historical Provider, MD  Memantine HCl ER 28 MG CP24 Take 28 mg by mouth daily. One daily to help preserve memory 07/26/13  Yes Estill Dooms, MD  mirabegron ER (MYRBETRIQ) 25 MG TB24 tablet Take 1 tablet (25 mg total) by mouth daily. Take one tablet at bedtime for bladder. 03/22/13  Yes Estill Dooms, MD  Multiple Vitamins-Minerals (MULTIVITAMIN GUMMIES ADULT) CHEW Chew by mouth. Chew two gummi es a day   Yes Historical Provider, MD  niacin 250 MG tablet Take 250 mg by mouth daily. Take 1 tablet daily @@ 5:00 pm with apple sauce   Yes Historical Provider, MD  NIFEdipine (NIFEDICAL XL) 30 MG 24 hr tablet Take 30 mg by mouth daily.   Yes Historical Provider, MD  omeprazole (PRILOSEC) 20 MG capsule Take 20 mg by mouth every other day. Take 1 tablet every other day to reduce stomach acids. 07/26/13  Yes Estill Dooms, MD  pilocarpine (PILOCAR) 1 % ophthalmic solution Place 1 drop into both eyes 3 (three) times daily.  08/17/11  Yes Historical Provider, MD  Probiotic Product (ALIGN) 4 MG CAPS Take by mouth. Take 1 capsule daily to help irritable colon. Only while on antibiotics.   Yes Historical Provider, MD  tiotropium (SPIRIVA) 18 MCG inhalation capsule Place 1 capsule (18 mcg total) into inhaler and inhale daily. 03/22/13 03/22/14 Yes Estill Dooms, MD  warfarin (COUMADIN) 5 MG tablet Take 2.5-5 mg by mouth daily. Take 1 tablet daily except 1/2 tablet on Mondays and Thursdays 05/09/13  Yes Cathey Miller, RPH-CPP  levothyroxine (SYNTHROID, LEVOTHROID) 75 MCG tablet Take 75 mcg by mouth daily. Take one tablet once a day  for thyroid supplement 10/21/12   Estill Dooms, MD    FAMILY HISTORY: Family History  Problem Relation Age of Onset  . Ovarian cancer Mother   . Cancer Mother   . Deep vein thrombosis Mother   . Heart disease Mother   . Arthritis Other     grandparents  . Lung cancer Other   . Heart disease Other     parent  . Cancer Father     BRAIN TUMOR  . Cancer Brother   . Hyperlipidemia Brother   . Hypertension Brother   . Stroke Brother   . Arthritis Brother   . Sickle cell anemia Daughter   . Heart disease Daughter   . Sickle cell anemia Daughter     SOCIAL HISTORY:  reports that she quit smoking about 33 years ago. Her smoking use included Cigarettes. She smoked 0.00 packs per day for 45 years. She has never used smokeless tobacco. She reports that she does not drink alcohol or use illicit drugs.  REVIEW OF SYSTEMS:  Constitutional:   No  weight loss, night sweats, chills,   HEENT:    No headaches, Difficulty swallowing,Tooth/dental problems,Sore throat,  No sneezing, itching, ear ache, nasal congestion, post nasal drip,   Cardio-vascular: No chest pain,  Orthopnea, PND, swelling in lower extremities, anasarca, dizziness, palpitations  GI:  No heartburn, indigestion, abdominal pain, nausea, vomiting, diarrhea, change in  bowel habits, loss of appetite  Resp:  No coughing up of blood.No change in color of mucus.No chest wall deformity  Skin:  no rash or lesions.  GU:  no dysuria, change in color of urine, no urgency or frequency.  No flank pain.  Musculoskeletal: No joint pain or swelling.  No decreased range of motion.  No back pain.  Psych: No change in mood or affect. No depression or anxiety.  No memory loss.   PHYSICAL EXAM: Blood pressure 166/51, pulse 81, temperature 99.9 F (37.7 C), temperature source Rectal, resp. rate 21, SpO2 95.00%.  General appearance :Awake, alert, somewhat confused but follows commands, not in any distress. Speech Clear but slow.  Not toxic Looking HEENT: Atraumatic and Normocephalic, pupils equally reactive to light  and accomodation. No mucous membranes involvement-a rash only in lower extremities, upper extremities and torso. Neck: supple, no JVD. No cervical lymphadenopathy.  Chest:Good air entry bilaterally, coarse breath sounds, rhonchi over. CVS: S1 S2 regular, no murmurs.  Abdomen: Bowel sounds present, Non tender and not distended with no gaurding, rigidity or rebound. Extremities: B/L Lower Ext shows no edema, both legs are warm to touch Neurology: Awake alert, and oriented X 3, CN II-XII intact, Non focal Skin: Diffuse blanching maculopapular rash in the lower extremities, torso, back, bilateral upper extremities. No mucous membrane involvement. Wounds:N/A  LABS ON ADMISSION:   Recent Labs  09/18/13 1614  NA 145  K 3.7  CL 104  CO2 25  GLUCOSE 96  BUN 9  CREATININE 1.08  CALCIUM 8.7    Recent Labs  09/18/13 1614  AST 22  ALT 13  ALKPHOS 69  BILITOT 0.2*  PROT 6.8  ALBUMIN 3.1*   No results found for this basename: LIPASE, AMYLASE,  in the last 72 hours  Recent Labs  09/18/13 1614  WBC 5.3  NEUTROABS 2.7  HGB 14.2  HCT 40.9  MCV 78.7  PLT 190    Recent Labs  09/18/13 1614  TROPONINI <0.30   No results found for this basename: DDIMER,  in the last 72 hours No components found with this basename: POCBNP,    RADIOLOGIC STUDIES ON ADMISSION: Dg Chest 2 View  09/18/2013   CLINICAL DATA:  Fever. Sudden onset diffuse torso and legs rash. Cough. Weakness.  EXAM: CHEST  2 VIEW  COMPARISON:  Previous examinations.  FINDINGS: Normal sized heart. Tortuous and partially calcified thoracic aorta. Central peribronchial thickening with progression since 09/25/2012. Minimal linear scar at the left lung base. Diffuse osteopenia.  IMPRESSION: Mild to moderate central bronchitic changes with progression.   Electronically Signed   By: Enrique Sack M.D.   On: 09/18/2013 15:51     EKG:  Independently reviewed. NSR  ASSESSMENT AND PLAN: Present on Admission:  . Acute asthmatic bronchitis - Patient will be admitted, started on Solu-Medrol, antibiotics, scheduled nebulized bronchodilators. Clinical course will be followed. Further adjustments of her medication will depend on clinical course. She does not appear to be in any distress, not using any excessive muscles.  . Rash and nonspecific skin eruption -? Drug rash-allergic reaction to? Mucinex or Delsym.  - Start Benadryl and Pepcid, will be on steroids for above noted reasons.  - Follow clinically.   . ADENOCARCINOMA, LEFT BREAST - Continue with letrozole, has outpatient followup with oncology.   . Alzheimer's disease - Continue Namenda   . GERD (gastroesophageal reflux disease) - Continue PPI   . HYPERTENSION - Continue nifedipine   . HYPOTHYROIDISM - Continue levothyroxine   . SEIZURE DISORDER - Continue Keppra  . History of DVT - On chronic Coumadin therapy, INR currently pending. We'll ask the pharmacy to manage Coumadin while inpatient  Further plan will depend as patient's clinical course evolves and further radiologic and laboratory data become available. Patient will be monitored closely.  Above noted plan was discussed with daughter/husband, they were in agreement.   DVT Prophylaxis: Not needed as will be on coumadin  Code Status: Full Code-confirmed with husband at bedside  Total time spent for admission equals 45 minutes.  [facility] Pager [phone]  If 7PM-7AM, please contact night-coverage [email or web address] Password [facility] 09/18/2013, 7:32 PM

## 2013-09-19 LAB — CBC
HCT: 42.4 % (ref 36.0–46.0)
Hemoglobin: 14.2 g/dL (ref 12.0–15.0)
MCH: 26.6 pg (ref 26.0–34.0)
MCHC: 33.5 g/dL (ref 30.0–36.0)
MCV: 79.5 fL (ref 78.0–100.0)
PLATELETS: 196 10*3/uL (ref 150–400)
RBC: 5.33 MIL/uL — AB (ref 3.87–5.11)
RDW: 16.1 % — ABNORMAL HIGH (ref 11.5–15.5)
WBC: 6.3 10*3/uL (ref 4.0–10.5)

## 2013-09-19 LAB — BASIC METABOLIC PANEL
BUN: 12 mg/dL (ref 6–23)
CO2: 22 mEq/L (ref 19–32)
Calcium: 8.8 mg/dL (ref 8.4–10.5)
Chloride: 103 mEq/L (ref 96–112)
Creatinine, Ser: 1.03 mg/dL (ref 0.50–1.10)
GFR, EST AFRICAN AMERICAN: 55 mL/min — AB (ref >90)
GFR, EST NON AFRICAN AMERICAN: 47 mL/min — AB (ref >90)
GLUCOSE: 135 mg/dL — AB (ref 70–99)
Potassium: 4.9 mEq/L (ref 3.7–5.3)
SODIUM: 140 meq/L (ref 137–147)

## 2013-09-19 LAB — INFLUENZA PANEL BY PCR (TYPE A & B)
H1N1 flu by pcr: NOT DETECTED
INFLBPCR: NEGATIVE
Influenza A By PCR: NEGATIVE

## 2013-09-19 LAB — PROTIME-INR
INR: 1.87 — ABNORMAL HIGH (ref 0.00–1.49)
Prothrombin Time: 21 seconds — ABNORMAL HIGH (ref 11.6–15.2)

## 2013-09-19 MED ORDER — WARFARIN SODIUM 7.5 MG PO TABS
7.5000 mg | ORAL_TABLET | Freq: Once | ORAL | Status: AC
  Administered 2013-09-19: 7.5 mg via ORAL
  Filled 2013-09-19: qty 1

## 2013-09-19 MED ORDER — LORAZEPAM 0.5 MG PO TABS
0.5000 mg | ORAL_TABLET | Freq: Two times a day (BID) | ORAL | Status: DC | PRN
  Administered 2013-09-20: 0.5 mg via ORAL
  Filled 2013-09-19 (×2): qty 1

## 2013-09-19 NOTE — Progress Notes (Signed)
Pt states her hand/wrist is hurting at site of IV.  No infiltration noted. Pt request IV has a chance to rest. Pt also refuses second stick for INR lab draw, first specimen was not enough blood. Daughter  At bedside and aware. Dorthey Sawyer

## 2013-09-19 NOTE — Evaluation (Signed)
Physical Therapy Evaluation Patient Details Name: Jennifer Brennan MRN: [id number] DOB: 1923-12-08 Today's Date: 09/19/2013   History of Present Illness  complaints of chest congestion, cough, shortness of breath for the past 5 days. She's had low-grade fever at home since this morning. She denies any chest pain. Daughter also notices a red rash to both legs.  Clinical Impression  Pt admitted with acute asthmatic bronchitis. Pt currently with functional limitations due to the deficits listed below (see PT Problem List).  Pt will benefit from skilled PT to increase their independence and safety with mobility to allow discharge to the venue listed below. Pt's family supportive and would like to take patient home and can provide 24 hour S.  Recommend HHPT to work towards improving functional mobility.      Follow Up Recommendations Home health PT;Supervision/Assistance - 24 hour    Equipment Recommendations       Recommendations for Other Services       Precautions / Restrictions Precautions Precautions: Fall Precaution Comments: blind      Mobility  Bed Mobility Overal bed mobility: Needs Assistance Bed Mobility: Supine to Sit     Supine to sit: Min assist        Transfers Overall transfer level: Needs assistance Equipment used: Rolling walker (2 wheeled) Transfers: Sit to/from Stand Sit to Stand: From elevated surface;Min assist         General transfer comment: flexed posture and verbal and tactile cues for hip extension.  Ambulation/Gait Ambulation/Gait assistance: Min assist Ambulation Distance (Feet): 3 Feet Assistive device: Rolling walker (2 wheeled)       General Gait Details: Pt with short gait bed > recliner. Declined further gait, because pt was getting very anxious and crying and upset about being in the hospital.  Stairs            Wheelchair Mobility    Modified Rankin (Stroke Patients Only)       Balance Overall balance assessment:  Needs assistance   Sitting balance-Leahy Scale: Fair Sitting balance - Comments: cues to keep feet on the floor     Standing balance-Leahy Scale: Poor                       Pertinent Vitals/Pain Denies pain    Home Living Family/patient expects to be discharged to:: Private residence Living Arrangements: Spouse/significant other;Children Available Help at Discharge: Family;Available 24 hours/day;Friend(s) Type of Home: House Home Access: Stairs to enter Entrance Stairs-Rails: Right Entrance Stairs-Number of Steps: 4 Home Layout: One level;Able to live on main level with bedroom/bathroom Home Equipment: Gilford Rile - 2 wheels;Wheelchair - manual;Cane - single point;Bedside commode;Hospital bed      Prior Function Level of Independence: Needs assistance   Gait / Transfers Assistance Needed: Pt ambulates with RW with w/c right behind her.  Daughter states someone is always with patient when she is up.           Hand Dominance        Extremity/Trunk Assessment               Lower Extremity Assessment: Overall WFL for tasks assessed      Cervical / Trunk Assessment: Normal  Communication   Communication: No difficulties  Cognition Arousal/Alertness: Awake/alert Behavior During Therapy: WFL for tasks assessed/performed;Restless;Anxious Overall Cognitive Status: History of cognitive impairments - at baseline  General Comments General comments (skin integrity, edema, etc.): Pt labile throughout session.  Daughter and husband present.    Exercises        Assessment/Plan    PT Assessment Patient needs continued PT services  PT Diagnosis Difficulty walking   PT Problem List Decreased strength;Decreased balance;Decreased mobility;Decreased activity tolerance  PT Treatment Interventions Gait training;Stair training;Functional mobility training;Therapeutic activities;Therapeutic exercise   PT Goals (Current goals can be found  in the Care Plan section) Acute Rehab PT Goals Patient Stated Goal: To go home PT Goal Formulation: With patient/family Time For Goal Achievement: 09/26/13 Potential to Achieve Goals: Good    Frequency Min 3X/week   Barriers to discharge        End of Session Equipment Utilized During Treatment: Gait belt Activity Tolerance: Treatment limited secondary to agitation Patient left: in chair;with family/visitor present;with call bell/phone within reach         Time: 1127-1155 PT Time Calculation (min): 28 min   Charges:   PT Evaluation $Initial PT Evaluation Tier I: 1 Procedure PT Treatments $Therapeutic Activity: 8-22 mins   PT G Codes:          Greenly Rarick LUBECK 09/19/2013, 12:20 PM

## 2013-09-19 NOTE — Progress Notes (Signed)
   CARE MANAGEMENT NOTE 09/19/2013  Patient:  Jennifer Brennan, Jennifer Brennan   Account Number:  [id number]  Date Initiated:  09/19/2013  Documentation initiated by:  Lizabeth Leyden  Subjective/Objective Assessment:   admitted with fever, cough  medical hx: COPD/asthma, afib, dementia     Action/Plan:   Anticipated DC Date:  09/20/2013   Anticipated DC Plan:  HOME/SELF CARE         Choice offered to / List presented to:             Status of service:  In process, will continue to follow Medicare Important Message given?   (If response is "NO", the following Medicare IM given date fields will be blank) Date Medicare IM given:   Date Additional Medicare IM given:    Discharge Disposition:    Per UR Regulation:    If discussed at Long Length of Stay Meetings, dates discussed:    Comments:

## 2013-09-19 NOTE — Progress Notes (Signed)
Patient Demographics  Jennifer Brennan, is a 80 y.o. female, DOB - 08-01-1923, YIR:[id number]  Admit date - 09/18/2013   Admitting Physician Evalee Mutton Kristeen Mans, MD  Outpatient Primary MD for the patient is GREEN, [name], MD  LOS - 1   Chief Complaint  Patient presents with  . Fever  . Cough  . Shortness of Breath  . Weakness        Assessment & Plan    . Acute asthmatic bronchitis  - much better on Solu-Medrol, Levaquin, scheduled nebulized bronchodilators, will try to titrate off oxygen.      . Generalized morbilliform Rash sparing face and mucos membranes -this is a  Drug rash-allergic reaction to Mucinex or Delsym.  Improving, monitor.       . ADENOCARCINOMA, LEFT BREAST  - Continue with letrozole, has outpatient followup with oncology.     . Alzheimer's disease  - Continue Namenda     . GERD (gastroesophageal reflux disease)  - Continue PPI     . HYPERTENSION  - Continue nifedipine, stable    . HYPOTHYROIDISM  - Continue levothyroxine     . SEIZURE DISORDER  - Continue Keppra     . History of DVT  - On chronic Coumadin per pharmacy    Code Status: Full  Family Communication: daughter  Disposition Plan: stable   Procedures     Consults      Medications  Scheduled Meds: . brimonidine  1 drop Both Eyes BID  . diphenhydrAMINE  12.5 mg Oral TID  . dorzolamide-timolol  1 drop Both Eyes BID  . famotidine  20 mg Oral BID  . fentaNYL  12.5 mcg Transdermal Q72H  . gabapentin  300 mg Oral QHS  . ipratropium-albuterol  3 mL Nebulization QID  . latanoprost  1 drop Both Eyes QHS  . letrozole  2.5 mg Oral QHS  . levETIRAcetam  500 mg Oral BID  . levofloxacin  250 mg Oral QHS  . levothyroxine  75 mcg Oral Daily  . Memantine HCl ER  28 mg Oral  Daily  . methylPREDNISolone (SOLU-MEDROL) injection  40 mg Intravenous 3 times per day  . mirabegron ER  25 mg Oral Daily  . NIFEdipine  30 mg Oral Daily  . pantoprazole  40 mg Oral Daily  . pilocarpine  1 drop Both Eyes TID  . Warfarin - Pharmacist Dosing Inpatient   Does not apply q1800   Continuous Infusions: . sodium chloride 50 mL/hr (09/18/13 2338)   PRN Meds:.acetaminophen, acetaminophen, albuterol, guaiFENesin-dextromethorphan, ondansetron (ZOFRAN) IV, ondansetron  DVT Prophylaxis  Coumadin  Lab Results  Component Value Date   INR 1.93* 09/18/2013   INR 2.9 08/29/2013   INR 2.6 08/01/2013   PROTIME 22.8* 05/31/2010   PROTIME 16.8* 05/29/2010   PROTIME 16.8* 05/22/2010     Lab Results  Component Value Date   PLT 196 09/19/2013    Antibiotics     Anti-infectives   Start     Dose/Rate Route Frequency Ordered Stop   09/19/13 2200  levofloxacin (LEVAQUIN) tablet 250 mg     250 mg Oral Daily at bedtime 09/18/13 2141     09/18/13 2200  levofloxacin (LEVAQUIN) tablet 500 mg     500 mg  Oral  Once 09/18/13 2141 09/18/13 2252          Subjective:   [name] today has, No headache, No chest pain, No abdominal pain - No Nausea, No new weakness tingling or numbness, No Cough - SOB.    Objective:   Filed Vitals:   09/18/13 2129 09/18/13 2134 09/18/13 2142 09/19/13 0458  BP: 137/65   150/60  Pulse:    71  Temp:    97.8 F (36.6 C)  TempSrc:    Oral  Resp:    18  Weight:    74.7 kg (164 lb 10.9 oz)  SpO2:  82% 92% 94%    Wt Readings from Last 3 Encounters:  09/19/13 74.7 kg (164 lb 10.9 oz)  08/15/13 76.839 kg (169 lb 6.4 oz)  08/01/13 75.297 kg (166 lb)     Intake/Output Summary (Last 24 hours) at 09/19/13 0831 Last data filed at 09/18/13 2209  Gross per 24 hour  Intake    240 ml  Output      0 ml  Net    240 ml     Physical Exam  Awake Alert, Oriented X 3, No new F.N deficits, Normal affect [place].AT,PERRAL Supple Neck,No JVD, No cervical  lymphadenopathy appriciated.  Symmetrical Chest wall movement, Good air movement bilaterally, CTAB RRR,No Gallops,Rubs or new Murmurs, No Parasternal Heave +ve B.Sounds, Abd Soft, Non tender, No organomegaly appriciated, No rebound - guarding or rigidity. No Cyanosis, Clubbing or edema, No new bruise, improving morbilliform rash sparing face, no mucosal involvement      Data Review   Micro Results No results found for this or any previous visit (from the past 240 hour(s)).  Radiology Reports Dg Chest 2 View  09/18/2013   CLINICAL DATA:  Fever. Sudden onset diffuse torso and legs rash. Cough. Weakness.  EXAM: CHEST  2 VIEW  COMPARISON:  Previous examinations.  FINDINGS: Normal sized heart. Tortuous and partially calcified thoracic aorta. Central peribronchial thickening with progression since 09/25/2012. Minimal linear scar at the left lung base. Diffuse osteopenia.  IMPRESSION: Mild to moderate central bronchitic changes with progression.   Electronically Signed   By: Enrique Sack M.D.   On: 09/18/2013 15:51    CBC  Recent Labs Lab 09/18/13 1614 09/19/13 0500  WBC 5.3 6.3  HGB 14.2 14.2  HCT 40.9 42.4  PLT 190 196  MCV 78.7 79.5  MCH 27.3 26.6  MCHC 34.7 33.5  RDW 15.7* 16.1*  LYMPHSABS 1.9  --   MONOABS 0.4  --   EOSABS 0.3  --   BASOSABS 0.0  --     Chemistries   Recent Labs Lab 09/18/13 1614 09/19/13 0500  NA 145 140  K 3.7 4.9  CL 104 103  CO2 25 22  GLUCOSE 96 135*  BUN 9 12  CREATININE 1.08 1.03  CALCIUM 8.7 8.8  AST 22  --   ALT 13  --   ALKPHOS 69  --   BILITOT 0.2*  --    ------------------------------------------------------------------------------------------------------------------ CrCl is unknown because both a height and weight (above a minimum accepted value) are required for this calculation. ------------------------------------------------------------------------------------------------------------------ No results found for this basename:  HGBA1C,  in the last 72 hours ------------------------------------------------------------------------------------------------------------------ No results found for this basename: CHOL, HDL, LDLCALC, TRIG, CHOLHDL, LDLDIRECT,  in the last 72 hours ------------------------------------------------------------------------------------------------------------------ No results found for this basename: TSH, T4TOTAL, FREET3, T3FREE, THYROIDAB,  in the last 72 hours ------------------------------------------------------------------------------------------------------------------ No results found for this basename: VITAMINB12, FOLATE, FERRITIN,  TIBC, IRON, RETICCTPCT,  in the last 72 hours  Coagulation profile  Recent Labs Lab 09/18/13 1937  INR 1.93*    No results found for this basename: DDIMER,  in the last 72 hours  Cardiac Enzymes  Recent Labs Lab 09/18/13 1614  TROPONINI <0.30   ------------------------------------------------------------------------------------------------------------------ No components found with this basename: POCBNP,      Time Spent in minutes   35   Jamare Vanatta K M.D on 09/19/2013 at 8:31 AM  Between 7am to 7pm - Pager - [phone]  After 7pm go to [email or web address] - password [email or web address]  And look for the night coverage person covering for me after hours  [facility] Office  [phone]

## 2013-09-19 NOTE — Evaluation (Signed)
Clinical/Bedside Swallow Evaluation Patient Details  Name: Jennifer Brennan MRN: [id number] Date of Birth: 04/25/1924  Today's Date: 09/19/2013 Time: 0850-0903 SLP Time Calculation (min): 13 min  Past Medical History:  Past Medical History  Diagnosis Date  . Hypertension   . Hypothyroidism   . Seizure disorder   . History of cerebrovascular accident   . Severe stage glaucoma     legally blind  . Depression with anxiety   . OAB (overactive bladder)   . ADENOCARCINOMA, LEFT BREAST 04/11/2010    s/p L mastectomy, on femara x 50yr  . BACK PAIN, LUMBAR, CHRONIC   . DVT 05/2007    RLE following R THR - chronic coumadin, chronic RLE pain  . GLAUCOMA   . OSTEOARTHRITIS, HANDS, BILATERAL   . OSTEOPENIA   . Retinal ischemia   . Sciatica of right side     chronic RLE pain, multifactorial - MRI T/L spine 02/2011  . VITAMIN D DEFICIENCY dx 08/2008  . Venous ulcer of right lower extremity with varicose veins   . Dementia   . Vulvar abscess   . Unspecified hypothyroidism   . Unspecified vitamin D deficiency   . Unspecified urinary incontinence   . Renal disorder   . Varicose veins of lower extremity   . Varicose veins of lower extremities with ulcer   . Unspecified disorder of kidney and ureter   . Stevens-Johnson syndrome   . Sebaceous cyst   . Osteoarthrosis, unspecified whether generalized or localized, unspecified site   . Disorder of bone and cartilage, unspecified   . Contusion of lower leg   . Cancer   . Depression   . Seizures   . Stroke   . Osteoporosis, unspecified 05/12/2013  . Acute asthmatic bronchitis 09/18/2013   Past Surgical History:  Past Surgical History  Procedure Laterality Date  . Total abdominal hysterectomy    . Total hip arthroplasty  10/2006    right hip  . Refractive surgery      B/L  . Breast surgery  03/2010    breast biopsy, L mastectomy   . Tonsillectomy     HPI:  Jennifer Brennan is a 80 y.o. female with a Past Medical History of  COPD/asthma, seizure disorder, dementia, history of DVT on chronic Coumadin therapy who was admitted with a rash and Acute asthmatic bronchitis. Pt has a history of GERD.    Assessment / Plan / Recommendation Clinical Impression  Pt presents with signs consistent with baseline GERD; slight belching noted with liquid intake. Discussed basic esopahgeal precautions. Pt is able to masticate solids and drink liquids without difficutly with mod assist due to blindness. No modified diet needed, no SLP f/u necessary. Will write regular diet and sign off.     Aspiration Risk  Mild    Diet Recommendation Regular;Thin liquid   Liquid Administration via: Cup;Straw Medication Administration: Whole meds with liquid Supervision: Staff to assist with self feeding Postural Changes and/or Swallow Maneuvers: Seated upright 90 degrees;Upright 30-60 min after meal    Other  Recommendations Oral Care Recommendations: Oral care BID   Follow Up Recommendations  None    Frequency and Duration        Pertinent Vitals/Pain NA    SLP Swallow Goals     Swallow Study Prior Functional Status       General HPI: Jennifer Brennan is a 80 y.o. female with a Past Medical History of COPD/asthma, seizure disorder, dementia, history of DVT on chronic Coumadin therapy  who was admitted with a rash and Acute asthmatic bronchitis. Pt has a history of GERD.  Type of Study: Bedside swallow evaluation Diet Prior to this Study: Dysphagia 3 (soft);Thin liquids Temperature Spikes Noted: No Respiratory Status: Room air History of Recent Intubation: No Behavior/Cognition: Alert;Cooperative;Pleasant mood Oral Cavity - Dentition: Dentures, top;Dentures, bottom Self-Feeding Abilities: Able to feed self;Needs assist Patient Positioning: Upright in bed Baseline Vocal Quality: Clear Volitional Cough: Strong Volitional Swallow: Able to elicit    Oral/Motor/Sensory Function Overall Oral Motor/Sensory Function: Appears within  functional limits for tasks assessed   Ice Chips     Thin Liquid Thin Liquid: Within functional limits Presentation: Straw;Self Fed    Nectar Thick Nectar Thick Liquid: Not tested   Honey Thick Honey Thick Liquid: Not tested   Puree Puree: Within functional limits   Solid   GO    Solid: Within functional limits Presentation: Jennifer Samyrah Bruster, MA CCC-SLP [phone]  Jennifer Brennan 09/19/2013,9:15 AM

## 2013-09-19 NOTE — Progress Notes (Signed)
Pt weaned off of o2. Now on room air O2 sats at 97%. Denies any SOB. Will cont to monitor.

## 2013-09-20 LAB — PROTIME-INR
INR: 2.32 — AB (ref 0.00–1.49)
Prothrombin Time: 24.7 seconds — ABNORMAL HIGH (ref 11.6–15.2)

## 2013-09-20 LAB — GLUCOSE, CAPILLARY: Glucose-Capillary: 136 mg/dL — ABNORMAL HIGH (ref 70–99)

## 2013-09-20 MED ORDER — WARFARIN SODIUM 5 MG PO TABS
5.0000 mg | ORAL_TABLET | Freq: Once | ORAL | Status: DC
  Filled 2013-09-20: qty 1

## 2013-09-20 MED ORDER — METHYLPREDNISOLONE 4 MG PO KIT: PACK | ORAL | Status: DC

## 2013-09-20 MED ORDER — LEVOFLOXACIN 250 MG PO TABS: 250.0000 mg | ORAL_TABLET | Freq: Every day | ORAL | Status: DC

## 2013-09-20 NOTE — Progress Notes (Signed)
Pt increasingly agitated this AM, getting dressed and requesting to leave. MD notified, states he will be up to see pt when he is available. Will continue to monitor.

## 2013-09-20 NOTE — Discharge Summary (Signed)
Jennifer Brennan, is a 80 y.o. female  DOB Jan 06, 1924  MRN [id number].  Admission date:  09/18/2013  Admitting Physician  Jonetta Osgood, MD  Discharge Date:  09/20/2013   Primary MD  Estill Dooms, MD  Recommendations for primary care physician for things to follow:   Follow CBC, BMP, INR, CXR in 2-3 days   Admission Diagnosis  Alzheimer's disease [331.0] Retinal ischemia [362.84] Unspecified essential hypertension [401.9] Rash [782.1] Acute asthmatic bronchitis [493.90] History of Stevens-Johnson toxic epidermal necrolysis overlap syndrome [V13.3]   Discharge Diagnosis  Alzheimer's disease [331.0] Retinal ischemia [362.84] Unspecified essential hypertension [401.9] Rash [782.1] Acute asthmatic bronchitis [493.90] History of Stevens-Johnson toxic epidermal necrolysis overlap syndrome [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 Hypertension   . Hypothyroidism   . Seizure disorder   . History of cerebrovascular accident   . Severe stage glaucoma     legally blind  . Depression with anxiety   . OAB (overactive bladder)   . ADENOCARCINOMA, LEFT BREAST 04/11/2010    s/p L mastectomy, on femara x 1yr  . BACK PAIN, LUMBAR, CHRONIC   . DVT 05/2007    RLE following R THR - chronic coumadin, chronic RLE pain  . GLAUCOMA   . OSTEOARTHRITIS, HANDS, BILATERAL   . OSTEOPENIA   . Retinal ischemia   . Sciatica of right side     chronic RLE pain, multifactorial - MRI T/L spine 02/2011  . VITAMIN D DEFICIENCY dx 08/2008  . Venous ulcer of right lower extremity with varicose veins     . Dementia   . Vulvar abscess   . Unspecified hypothyroidism   . Unspecified vitamin D deficiency   . Unspecified urinary incontinence   . Renal disorder   . Varicose veins of lower extremity   . Varicose veins of lower extremities with ulcer   . Unspecified disorder of kidney and ureter   . Stevens-Johnson syndrome   . Sebaceous cyst   . Osteoarthrosis, unspecified whether generalized or localized, unspecified site   . Disorder of bone and cartilage, unspecified   . Contusion of lower leg   . Cancer   . Depression   . Seizures   . Stroke   . Osteoporosis, unspecified 05/12/2013  . Acute asthmatic bronchitis 09/18/2013    Past Surgical History  Procedure Laterality Date  . Total abdominal hysterectomy    . Total hip arthroplasty  10/2006    right hip  . Refractive surgery      B/L  . Breast surgery  03/2010    breast biopsy, L mastectomy   . Tonsillectomy       Discharge Condition: stable   Follow UP  Follow-up Information   Follow up with GREEN, Arnell Sieving  G, MD. Schedule an appointment as soon as possible for a visit in 3 days.   Specialty:  Internal Medicine   Contact information:   [address] [place] [address] [phone]       Follow up In 2 days.        Discharge Instructions  and  Discharge Medications      Discharge Orders   Future Appointments Provider Department Dept Phone   10/03/2013 12:00 PM Edison Pace, RPH-CPP [facility] [phone]   12/28/2013 3:00 PM Kimber Relic, MD [facility] [phone]   05/23/2014 3:30 PM [facility] [phone]   05/23/2014 4:00 PM Lowella Dell, MD [facility] [phone]   Future Orders Complete By Expires   Discharge instructions  As directed    Comments:     Follow with Primary MD GREEN, Lenon Curt, MD in 2 days   Get CBC, CMP, INR checked 2 days by Primary MD and again as instructed by your  Primary MD. Get a 2 view Chest X ray done next visit.   Activity: As tolerated with Full fall precautions use walker/cane & assistance as needed   Disposition Home     Diet: Heart Healthy    For Heart failure patients - Check your Weight same time everyday, if you gain over 2 pounds, or you develop in leg swelling, experience more shortness of breath or chest pain, call your Primary MD immediately. Follow Cardiac Low Salt Diet and 1.8 lit/day fluid restriction.   On your next visit with her primary care physician please Get Medicines reviewed and adjusted.  Please request your Prim.MD to go over all Hospital Tests and Procedure/Radiological results at the follow up, please get all Hospital records sent to your Prim MD by signing hospital release before you go home.   If you experience worsening of your admission symptoms, develop shortness of breath, life threatening emergency, suicidal or homicidal thoughts you must seek medical attention immediately by calling 911 or calling your MD immediately  if symptoms less severe.  You Must read complete instructions/literature along with all the possible adverse reactions/side effects for all the Medicines you take and that have been prescribed to you. Take any new Medicines after you have completely understood and accpet all the possible adverse reactions/side effects.   Do not drive and provide baby sitting services if your were admitted for syncope or siezures until you have seen by Primary MD or a Neurologist and advised to do so again.  Do not drive when taking Pain medications.    Do not take more than prescribed Pain, Sleep and Anxiety Medications  Special Instructions: If you have smoked or chewed Tobacco  in the last 2 yrs please stop smoking, stop any regular Alcohol  and or any Recreational drug use.  Wear Seat belts while driving.   Please note  You were cared for by a hospitalist during your hospital stay. If you have any  questions about your discharge medications or the care you received while you were in the hospital after you are discharged, you can call the unit and asked to speak with the hospitalist on call if the hospitalist that took care of you is not available. Once you are discharged, your primary care physician will handle any further medical issues. Please note that NO REFILLS for any discharge medications will be authorized once you are discharged, as it is imperative that  you return to your primary care physician (or establish a relationship with a primary care physician if you do not have one) for your aftercare needs so that they can reassess your need for medications and monitor your lab values.   Increase activity slowly  As directed        Medication List         acetaminophen 500 MG tablet  Commonly known as:  TYLENOL  Take 1,000 mg by mouth every 6 (six) hours as needed. pain     albuterol 108 (90 BASE) MCG/ACT inhaler  Commonly known as:  PROVENTIL HFA;VENTOLIN HFA  Inhale 2 puffs into the lungs every 6 (six) hours as needed. For shortness of breath.     ALIGN 4 MG Caps  Take by mouth. Take 1 capsule daily to help irritable colon. Only while on antibiotics.     brimonidine 0.15 % ophthalmic solution  Commonly known as:  ALPHAGAN  Place 1 drop into both eyes 2 (two) times daily.     Cholecalciferol 2000 UNITS Tabs  Take 1 tablet by mouth every morning.     COMBIVENT RESPIMAT 20-100 MCG/ACT Aers respimat  Generic drug:  Ipratropium-Albuterol  Inhale 2 puffs into the lungs every 6 (six) hours. Take 2 puffs 4 times daily to help with breathing.     Cranberry-Vitamin C-Vitamin E 4200-20-3 MG-MG-UNIT Caps  Take by mouth. Take one tablet once a day     dextromethorphan 30 MG/5ML liquid  Commonly known as:  DELSYM  Take 60 mg by mouth at bedtime as needed for cough.     diphenhydrAMINE 25 MG tablet  Commonly known as:  BENADRYL  Take 12.5 mg by mouth 2 (two) times daily as needed.  Pt took while on antibiotics     docusate sodium 100 MG capsule  Commonly known as:  COLACE  Take 100 mg by mouth 2 (two) times daily. Take one tablet twice a day as needed for  Constipation     dorzolamide-timolol 22.3-6.8 MG/ML ophthalmic solution  Commonly known as:  COSOPT  Place 1 drop into both eyes 2 (two) times daily.     fentaNYL 12 MCG/HR  Commonly known as:  DURAGESIC - dosed mcg/hr  Place 12.5 mcg onto the skin every 3 (three) days.     Fluticasone-Salmeterol 250-50 MCG/DOSE Aepb  Commonly known as:  ADVAIR  Inhale 1 puff into the lungs 2 (two) times daily as needed. For shortness of breath     gabapentin 300 MG capsule  Commonly known as:  NEURONTIN  take 2 capsules by mouth at bedtime for pain     latanoprost 0.005 % ophthalmic solution  Commonly known as:  XALATAN  Place 1 drop into both eyes at bedtime.     letrozole 2.5 MG tablet  Commonly known as:  FEMARA  Take 2.5 mg by mouth at bedtime.     levETIRAcetam 500 MG tablet  Commonly known as:  KEPPRA  Take 500 mg by mouth 2 (two) times daily.     levofloxacin 250 MG tablet  Commonly known as:  LEVAQUIN  Take 1 tablet (250 mg total) by mouth at bedtime.     levothyroxine 75 MCG tablet  Commonly known as:  SYNTHROID, LEVOTHROID  Take 75 mcg by mouth daily. Take one tablet once a day for thyroid supplement     Memantine HCl ER 28 MG Cp24  Take 28 mg by mouth daily. One daily to help preserve memory  methylPREDNISolone 4 MG tablet  Commonly known as:  MEDROL DOSEPAK  follow package directions     mirabegron ER 25 MG Tb24 tablet  Commonly known as:  MYRBETRIQ  Take 1 tablet (25 mg total) by mouth daily. Take one tablet at bedtime for bladder.     MUCINEX DM PO  Take 1 tablet by mouth 2 (two) times daily as needed (congestion, cough).     MULTIVITAMIN GUMMIES ADULT Chew  Chew by mouth. Chew two gummi es a day     niacin 250 MG tablet  Take 250 mg by mouth daily. Take 1 tablet daily @@ 5:00 pm  with apple sauce     NIFEDICAL XL 30 MG 24 hr tablet  Generic drug:  NIFEdipine  Take 30 mg by mouth daily.     omeprazole 20 MG capsule  Commonly known as:  PRILOSEC  Take 20 mg by mouth every other day. Take 1 tablet every other day to reduce stomach acids.     pilocarpine 1 % ophthalmic solution  Commonly known as:  PILOCAR  Place 1 drop into both eyes 3 (three) times daily.     tiotropium 18 MCG inhalation capsule  Commonly known as:  SPIRIVA  Place 1 capsule (18 mcg total) into inhaler and inhale daily.     warfarin 5 MG tablet  Commonly known as:  COUMADIN  Take 2.5-5 mg by mouth daily. Take 1 tablet daily except 1/2 tablet on Mondays and Thursdays          Diet and Activity recommendation: See Discharge Instructions above   Consults obtained -     Major procedures and Radiology Reports - PLEASE review detailed and final reports for all details, in brief -       Dg Chest 2 View  09/18/2013   CLINICAL DATA:  Fever. Sudden onset diffuse torso and legs rash. Cough. Weakness.  EXAM: CHEST  2 VIEW  COMPARISON:  Previous examinations.  FINDINGS: Normal sized heart. Tortuous and partially calcified thoracic aorta. Central peribronchial thickening with progression since 09/25/2012. Minimal linear scar at the left lung base. Diffuse osteopenia.  IMPRESSION: Mild to moderate central bronchitic changes with progression.   Electronically Signed   By: Enrique Sack M.D.   On: 09/18/2013 15:51    Micro Results      No results found for this or any previous visit (from the past 240 hour(s)).   History of present illness and  Hospital Course:     Kindly see H&P for history of present illness and admission details, please review complete Labs, Consult reports and Test reports for all details in brief MAIYA STERLING, is a 80 y.o. female, patient with history of  COPD/asthma, seizure disorder, dementia, history of DVT on chronic Coumadin therapy who was admitted for Mild  bronchitis and a drug rash.     . Acute asthmatic bronchitis  - much better after steroids and Levaquin, asymtomatic, no o2 need, will place on PO medrol dose pk along with 3 more days of levaquin.    . Generalized morbilliform Rash sparing face and mucos membranes  -this is a Drug rash-allergic reaction to Mucinex or Delsym. Improving, monitor.     . ADENOCARCINOMA, LEFT BREAST  - Continue with letrozole, has outpatient followup with oncology.    . Alzheimer's disease (legally blind)  - Continue Namenda    . GERD (gastroesophageal reflux disease)  - Continue PPI    . HYPERTENSION  - Continue nifedipine, stable    .  HYPOTHYROIDISM  - Continue levothyroxine    . SEIZURE DISORDER  - Continue Keppra   . History of DVT  - On chronic Coumadin per pharmacy   Lab Results  Component Value Date   INR 2.32* 09/20/2013   INR 1.87* 09/19/2013   INR 1.93* 09/18/2013   PROTIME 22.8* 05/31/2010   PROTIME 16.8* 05/29/2010   PROTIME 16.8* 05/22/2010       Today   Subjective:   [name] today has no headache,no chest abdominal pain,no new weakness tingling or numbness, feels much better wants to go home today.   Objective:   Blood pressure 160/78, pulse 100, temperature 98.4 F (36.9 C), temperature source Oral, resp. rate 20, height 5\' 4"  (1.626 m), weight 74.7 kg (164 lb 10.9 oz), SpO2 98.00%.   Intake/Output Summary (Last 24 hours) at 09/20/13 1036 Last data filed at 09/19/13 1700  Gross per 24 hour  Intake    520 ml  Output      0 ml  Net    520 ml    Exam Awake Alert, Oriented *3, No new F.N deficits, Normal affect [place].AT,PERRAL Supple Neck,No JVD, No cervical lymphadenopathy appriciated.  Symmetrical Chest wall movement, Good air movement bilaterally, CTAB RRR,No Gallops,Rubs or new Murmurs, No Parasternal Heave +ve B.Sounds, Abd Soft, Non tender, No organomegaly appriciated, No rebound -guarding or rigidity. No Cyanosis, Clubbing or edema, No new  Rash or bruise, improving Generalized morbilliform Rash sparing face and mucos membranes      Data Review   CBC w Diff: Lab Results  Component Value Date   WBC 6.3 09/19/2013   WBC 7.3 05/12/2013   HGB 14.2 09/19/2013   HGB 13.3 05/12/2013   HCT 42.4 09/19/2013   HCT 42.1 05/12/2013   PLT 196 09/19/2013   PLT 211 05/12/2013   LYMPHOPCT 35 09/18/2013   LYMPHOPCT 41.0 05/12/2013   MONOPCT 8 09/18/2013   MONOPCT 8.9 05/12/2013   EOSPCT 6* 09/18/2013   EOSPCT 2.5 05/12/2013   BASOPCT 0 09/18/2013   BASOPCT 0.8 05/12/2013    CMP: Lab Results  Component Value Date   NA 140 09/19/2013   NA 143 05/12/2013   K 4.9 09/19/2013   K 3.6 05/12/2013   CL 103 09/19/2013   CL 106 11/12/2012   CO2 22 09/19/2013   CO2 26 05/12/2013   BUN 12 09/19/2013   BUN 10.3 05/12/2013   CREATININE 1.03 09/19/2013   CREATININE 1.1 05/12/2013   CREATININE 1.14* 01/20/2011   PROT 6.8 09/18/2013   PROT 7.6 05/12/2013   ALBUMIN 3.1* 09/18/2013   ALBUMIN 3.4* 05/12/2013   BILITOT 0.2* 09/18/2013   BILITOT 0.42 05/12/2013   ALKPHOS 69 09/18/2013   ALKPHOS 82 05/12/2013   AST 22 09/18/2013   AST 18 05/12/2013   ALT 13 09/18/2013   ALT 10 05/12/2013  .   Total Time in preparing paper work, data evaluation and todays exam - 35 minutes  Thurnell Lose M.D on 09/20/2013 at 10:36 AM  [facility] Office  [phone]

## 2013-09-20 NOTE — Discharge Instructions (Signed)
Follow with Primary MD GREEN, [name], MD in 2 days   Get CBC, CMP, INR checked 2 days by Primary MD and again as instructed by your Primary MD. Get a 2 view Chest X ray done next visit.   Activity: As tolerated with Full fall precautions use walker/cane & assistance as needed   Disposition Home     Diet: Heart Healthy    For Heart failure patients - Check your Weight same time everyday, if you gain over 2 pounds, or you develop in leg swelling, experience more shortness of breath or chest pain, call your Primary MD immediately. Follow Cardiac Low Salt Diet and 1.8 lit/day fluid restriction.   On your next visit with her primary care physician please Get Medicines reviewed and adjusted.  Please request your Prim.MD to go over all Hospital Tests and Procedure/Radiological results at the follow up, please get all Hospital records sent to your Prim MD by signing hospital release before you go home.   If you experience worsening of your admission symptoms, develop shortness of breath, life threatening emergency, suicidal or homicidal thoughts you must seek medical attention immediately by calling 911 or calling your MD immediately  if symptoms less severe.  You Must read complete instructions/literature along with all the possible adverse reactions/side effects for all the Medicines you take and that have been prescribed to you. Take any new Medicines after you have completely understood and accpet all the possible adverse reactions/side effects.   Do not drive and provide baby sitting services if your were admitted for syncope or siezures until you have seen by Primary MD or a Neurologist and advised to do so again.  Do not drive when taking Pain medications.    Do not take more than prescribed Pain, Sleep and Anxiety Medications  Special Instructions: If you have smoked or chewed Tobacco  in the last 2 yrs please stop smoking, stop any regular Alcohol  and or any Recreational drug  use.  Wear Seat belts while driving.   Please note  You were cared for by a hospitalist during your hospital stay. If you have any questions about your discharge medications or the care you received while you were in the hospital after you are discharged, you can call the unit and asked to speak with the hospitalist on call if the hospitalist that took care of you is not available. Once you are discharged, your primary care physician will handle any further medical issues. Please note that NO REFILLS for any discharge medications will be authorized once you are discharged, as it is imperative that you return to your primary care physician (or establish a relationship with a primary care physician if you do not have one) for your aftercare needs so that they can reassess your need for medications and monitor your lab values.

## 2013-09-20 NOTE — Progress Notes (Signed)
   CARE MANAGEMENT NOTE 09/20/2013  Patient:  Jennifer Brennan, Jennifer Brennan   Account Number:  [id number]  Date Initiated:  09/19/2013  Documentation initiated by:  Lizabeth Leyden  Subjective/Objective Assessment:   admitted with fever, cough  medical hx: COPD/asthma, afib, dementia     Action/Plan:   home health PT   Anticipated DC Date:  09/20/2013   Anticipated DC Plan:  [place]  CM consult      [facility] Choice  HOME HEALTH   Choice offered to / List presented to:  C-3 Spouse        HH arranged  HH-2 PT      [place].   Status of service:  Completed, signed off Medicare Important Message given?   (If response is "NO", the following Medicare IM given date fields will be blank) Date Medicare IM given:   Date Additional Medicare IM given:    Discharge Disposition:  [place]  Per UR Regulation:    If discussed at Long Length of Stay Meetings, dates discussed:    Comments:  09/20/2013  [place], [place]  [phone] CM referral: home health PT  Met with patient, spouse and daughter regarding home health services. [facility] selected for HHPT.  [facility]/Donna called with referral

## 2013-09-20 NOTE — Progress Notes (Signed)
[place] for warfarin Indication: DVT  Allergies  Allergen Reactions  . Bactrim [Sulfamethoxazole-Trimethoprim] Other (See Comments)    MD stopped due to kidney failure Also causes lips to swell  . Furosemide Other (See Comments)    MD stopped due to kidney failure  . Percocet [Oxycodone-Acetaminophen] Other (See Comments)    Causes SEIZURES  . Valsartan Other (See Comments)    MD stopped due to kidney failure  . Doxycycline Swelling    Causes lips to swell, wheezing  . Penicillins Hives  . Morphine Sulfate Itching and Rash  . Sertraline Hcl Rash    Patient Measurements: Height: 5\' 4"  (162.6 cm) Weight: 164 lb 10.9 oz (74.7 kg) IBW/kg (Calculated) : 54.7  Vital Signs: Temp: 98.4 F (36.9 C) (03/31 0955) Temp src: Oral (03/31 0955) BP: 160/78 mmHg (03/31 0955) Pulse Rate: 100 (03/31 0955)  Labs:  Recent Labs  09/18/13 1614 09/18/13 1937 09/19/13 0500 09/19/13 0850 09/20/13 0700  HGB 14.2  --  14.2  --   --   HCT 40.9  --  42.4  --   --   PLT 190  --  196  --   --   LABPROT  --  21.5*  --  21.0* 24.7*  INR  --  1.93*  --  1.87* 2.32*  CREATININE 1.08  --  1.03  --   --   TROPONINI <0.30  --   --   --   --     Estimated Creatinine Clearance: 38.1 ml/min (by C-G formula based on Cr of 1.03).   Medical History: Past Medical History  Diagnosis Date  . Hypertension   . Hypothyroidism   . Seizure disorder   . History of cerebrovascular accident   . Severe stage glaucoma     legally blind  . Depression with anxiety   . OAB (overactive bladder)   . ADENOCARCINOMA, LEFT BREAST 04/11/2010    s/p L mastectomy, on femara x 68yr  . BACK PAIN, LUMBAR, CHRONIC   . DVT 05/2007    RLE following R THR - chronic coumadin, chronic RLE pain  . GLAUCOMA   . OSTEOARTHRITIS, HANDS, BILATERAL   . OSTEOPENIA   . Retinal ischemia   . Sciatica of right side     chronic RLE pain, multifactorial - MRI T/L spine 02/2011  . VITAMIN D  DEFICIENCY dx 08/2008  . Venous ulcer of right lower extremity with varicose veins   . Dementia   . Vulvar abscess   . Unspecified hypothyroidism   . Unspecified vitamin D deficiency   . Unspecified urinary incontinence   . Renal disorder   . Varicose veins of lower extremity   . Varicose veins of lower extremities with ulcer   . Unspecified disorder of kidney and ureter   . Stevens-Johnson syndrome   . Sebaceous cyst   . Osteoarthrosis, unspecified whether generalized or localized, unspecified site   . Disorder of bone and cartilage, unspecified   . Contusion of lower leg   . Cancer   . Depression   . Seizures   . Stroke   . Osteoporosis, unspecified 05/12/2013  . Acute asthmatic bronchitis 09/18/2013    Medications:  warfarin  Assessment: 80 year old female on warfarin PTA for h/o DVT.  INR today is 2.32.  Home dose: warfarin 2.5 mg po on Mon/Thurs and 5 mg po on all other days.    Goal of Therapy:  INR 2-3 Monitor platelets by  anticoagulation protocol: Yes   Plan:  Warfarin 5 mg po x1 Daily PT/INR  Hughes Better, PharmD, BCPS Clinical Pharmacist Pager: [phone] 09/20/2013 1:26 PM

## 2013-09-21 ENCOUNTER — Ambulatory Visit (INDEPENDENT_AMBULATORY_CARE_PROVIDER_SITE_OTHER): Payer: Medicare Other | Admitting: Internal Medicine

## 2013-09-21 ENCOUNTER — Encounter: Payer: Self-pay | Admitting: Internal Medicine

## 2013-09-21 VITALS — BP 138/80 | HR 56 | Temp 97.5°F | Resp 10

## 2013-09-21 DIAGNOSIS — R5381 Other malaise: Secondary | ICD-10-CM

## 2013-09-21 DIAGNOSIS — E039 Hypothyroidism, unspecified: Secondary | ICD-10-CM

## 2013-09-21 DIAGNOSIS — M545 Low back pain, unspecified: Secondary | ICD-10-CM

## 2013-09-21 DIAGNOSIS — N318 Other neuromuscular dysfunction of bladder: Secondary | ICD-10-CM

## 2013-09-21 DIAGNOSIS — I10 Essential (primary) hypertension: Secondary | ICD-10-CM

## 2013-09-21 DIAGNOSIS — J45909 Unspecified asthma, uncomplicated: Secondary | ICD-10-CM

## 2013-09-21 DIAGNOSIS — R5383 Other fatigue: Secondary | ICD-10-CM

## 2013-09-21 DIAGNOSIS — R531 Weakness: Secondary | ICD-10-CM

## 2013-09-21 DIAGNOSIS — Z7901 Long term (current) use of anticoagulants: Secondary | ICD-10-CM

## 2013-09-21 DIAGNOSIS — D72829 Elevated white blood cell count, unspecified: Secondary | ICD-10-CM | POA: Insufficient documentation

## 2013-09-21 MED ORDER — FENTANYL 12 MCG/HR TD PT72: 12.5000 ug | MEDICATED_PATCH | TRANSDERMAL | Status: DC

## 2013-09-21 NOTE — Progress Notes (Signed)
Patient ID: Jennifer Brennan, female   DOB: 1923/12/14, 80 y.o.   MRN: [id number]    Location:  [facility]   Place of Service: OFFICE    Allergies  Allergen Reactions  . Bactrim [Sulfamethoxazole-Trimethoprim] Other (See Comments)    MD stopped due to kidney failure Also causes lips to swell  . Furosemide Other (See Comments)    MD stopped due to kidney failure  . Percocet [Oxycodone-Acetaminophen] Other (See Comments)    Causes SEIZURES  . Valsartan Other (See Comments)    MD stopped due to kidney failure  . Doxycycline Swelling    Causes lips to swell, wheezing  . Penicillins Hives  . Morphine Sulfate Itching and Rash  . Sertraline Hcl Rash    Chief Complaint  Patient presents with  . Hospitalization Follow-up    Hospital follow-up: reveiw medicaiton changes/adjustments made in the hospital, discuss if O2 is needed     HPI:  Hospitalized 09/18/13 to 09/20/13.had bronchitis. Temp to 106 degrees when picked up by EMT. Had a vasculitic rash breaking out on the legs, back, and trunk. Although she continues to cough, she feels like she is breathing easier. Rash is improving. She remains weak. Appetite is improved.  OVERACTIVE BLADDER: Myrbetriq was held during the hospitalization. She does not seem to have to urinate as frequently as in the past when this drug was started. She would like to Hold the drug to see if she can do without it.  Acute asthmatic bronchitis: improving  Chronic anticoagulation - recheck Protime-INR  HYPERTENSION - controlled  HYPOTHYROIDISM: stable  Leukocytosis - recheck CBC With differential/Platelet  BACK PAIN, LUMBAR, CHRONIC - benefits from fentaNYL (DURAGESIC - DOSED MCG/HR) 12 MCG/HR    Medications: Patient's Medications  New Prescriptions   No medications on file  Previous Medications   ACETAMINOPHEN (TYLENOL) 500 MG TABLET    Take 1,000 mg by mouth every 6 (six) hours as needed. pain   ALBUTEROL (PROVENTIL HFA;VENTOLIN HFA) 108 (90 BASE)  MCG/ACT INHALER    Inhale 2 puffs into the lungs every 6 (six) hours as needed. For shortness of breath.   BRIMONIDINE (ALPHAGAN) 0.15 % OPHTHALMIC SOLUTION    Place 1 drop into both eyes 2 (two) times daily.   CHOLECALCIFEROL 2000 UNITS TABS    Take 1 tablet by mouth every morning.   CRANBERRY-VITAMIN C-VITAMIN E 4200-20-3 MG-MG-UNIT CAPS    Take by mouth. Take one tablet once a day   DIPHENHYDRAMINE (BENADRYL) 25 MG TABLET    Take 12.5 mg by mouth 2 (two) times daily as needed. Pt took while on antibiotics   DOCUSATE SODIUM (COLACE) 100 MG CAPSULE    Take 100 mg by mouth 2 (two) times daily. Take one tablet twice a day as needed for  Constipation   DORZOLAMIDE-TIMOLOL (COSOPT) 22.3-6.8 MG/ML OPHTHALMIC SOLUTION    Place 1 drop into both eyes 2 (two) times daily.     FLUTICASONE-SALMETEROL (ADVAIR) 250-50 MCG/DOSE AEPB    Inhale 1 puff into the lungs 2 (two) times daily as needed. For shortness of breath   GABAPENTIN (NEURONTIN) 300 MG CAPSULE    take 2 capsules by mouth at bedtime for pain   IPRATROPIUM-ALBUTEROL (COMBIVENT RESPIMAT) 20-100 MCG/ACT AERS RESPIMAT    Inhale 2 puffs into the lungs every 6 (six) hours. Take 2 puffs 4 times daily to help with breathing.   LATANOPROST (XALATAN) 0.005 % OPHTHALMIC SOLUTION    Place 1 drop into both eyes at bedtime.  LETROZOLE (FEMARA) 2.5 MG TABLET    Take 2.5 mg by mouth at bedtime.   LEVETIRACETAM (KEPPRA) 500 MG TABLET    Take 500 mg by mouth 2 (two) times daily.   LEVOFLOXACIN (LEVAQUIN) 250 MG TABLET    Take 1 tablet (250 mg total) by mouth at bedtime.   LEVOTHYROXINE (SYNTHROID, LEVOTHROID) 75 MCG TABLET    Take 75 mcg by mouth daily. Take one tablet once a day for thyroid supplement   MEMANTINE HCL ER 28 MG CP24    Take 28 mg by mouth daily. One daily to help preserve memory   METHYLPREDNISOLONE (MEDROL DOSEPAK) 4 MG TABLET    follow package directions   MULTIPLE VITAMINS-MINERALS (MULTIVITAMIN GUMMIES ADULT) CHEW    Chew by mouth. Chew two  gummi es a day   NIACIN 250 MG TABLET    Take 250 mg by mouth daily. Take 1 tablet daily @@ 5:00 pm with apple sauce   NIFEDIPINE (NIFEDICAL XL) 30 MG 24 HR TABLET    Take 30 mg by mouth daily.   OMEPRAZOLE (PRILOSEC) 20 MG CAPSULE    Take 20 mg by mouth every other day. Take 1 tablet every other day to reduce stomach acids.   PILOCARPINE (PILOCAR) 1 % OPHTHALMIC SOLUTION    Place 1 drop into both eyes 3 (three) times daily.    PROBIOTIC PRODUCT (ALIGN) 4 MG CAPS    Take by mouth. Take 1 capsule daily to help irritable colon. Only while on antibiotics.   WARFARIN (COUMADIN) 5 MG TABLET    Take 2.5-5 mg by mouth daily. Take 1 tablet daily except 1/2 tablet on Mondays and Thursdays  Modified Medications   Modified Medication Previous Medication   FENTANYL (DURAGESIC - DOSED MCG/HR) 12 MCG/HR fentaNYL (DURAGESIC - DOSED MCG/HR) 12 MCG/HR      Place 1 patch (12.5 mcg total) onto the skin every 3 (three) days.    Place 12.5 mcg onto the skin every 3 (three) days.  Discontinued Medications   DEXTROMETHORPHAN (DELSYM) 30 MG/5ML LIQUID    Take 60 mg by mouth at bedtime as needed for cough.   DEXTROMETHORPHAN-GUAIFENESIN (MUCINEX DM PO)    Take 1 tablet by mouth 2 (two) times daily as needed (congestion, cough).   MIRABEGRON ER (MYRBETRIQ) 25 MG TB24 TABLET    Take 1 tablet (25 mg total) by mouth daily. Take one tablet at bedtime for bladder.   TIOTROPIUM (SPIRIVA) 18 MCG INHALATION CAPSULE    Place 1 capsule (18 mcg total) into inhaler and inhale daily.     Review of Systems  Constitutional: Positive for fatigue. Negative for fever, diaphoresis, activity change, appetite change and unexpected weight change.  HENT: Positive for hearing loss. Negative for congestion and ear pain.   Eyes: Positive for visual disturbance.       Prescription lenses  Respiratory: Negative for apnea, cough, choking, chest tightness, shortness of breath and wheezing.   Cardiovascular: Positive for leg swelling. Negative for  chest pain and palpitations.  Gastrointestinal: Negative for abdominal pain and abdominal distention.  Endocrine: Negative for cold intolerance, polydipsia, polyphagia and polyuria.  Genitourinary: Positive for frequency. Negative for decreased urine volume, difficulty urinating and pelvic pain.       Urinary incontinence.  Musculoskeletal: Positive for arthralgias, gait problem and myalgias. Negative for back pain, joint swelling, neck pain and neck stiffness.  Skin:       Vasculitic peppered red rash on both legs and feet. Associated with venous insufficiency and  chronic edema. The area is not warm. It is mildly tender to touch.  Allergic/Immunologic: Negative.   Neurological: Positive for weakness. Negative for dizziness, tremors, seizures, syncope, facial asymmetry, speech difficulty, light-headedness, numbness and headaches.  Hematological: Negative.   Psychiatric/Behavioral: Positive for confusion, sleep disturbance, decreased concentration and agitation. Negative for suicidal ideas, hallucinations and behavioral problems. The patient is not nervous/anxious and is not hyperactive.     Filed Vitals:   09/21/13 1515  BP: 138/80  Pulse: 56  Temp: 97.5 F (36.4 C)  TempSrc: Oral  Resp: 10  SpO2: 90%   Physical Exam  Constitutional: No distress.  HENT:  Head: Normocephalic and atraumatic.  Diminished hearing bilaterally. Cerumen occlusion bilaterally. Exposure of root of the lower left canine.  Eyes: Conjunctivae and EOM are normal. Left eye exhibits no discharge.  Neck: Normal range of motion. Neck supple. No JVD present. No tracheal deviation present. No thyromegaly present.  Cardiovascular: Normal rate, regular rhythm and normal heart sounds.  Exam reveals no gallop and no friction rub.   No murmur heard. Pulmonary/Chest: Effort normal and breath sounds normal. No respiratory distress. She has no wheezes. She has no rales.  Abdominal: Bowel sounds are normal. She exhibits no  distension and no mass. There is no tenderness.  Musculoskeletal: Normal range of motion. She exhibits edema and tenderness.  Tender at both knees. Tender in the right groin area.  Lymphadenopathy:    She has no cervical adenopathy.  Neurological: She is alert. No cranial nerve deficit. Coordination normal.  Memory deficit.  Skin: Skin is warm and dry. She is not diaphoretic. There is erythema. No pallor.  Waxy scabs on both legs. Vasculitic ras on both legs and feet: red peppered macules.  Psychiatric:  Significant dementia. Calm at our office today.. Improvement episodes of agitation at home.     Labs reviewed: Admission on 09/18/2013, Discharged on 09/20/2013  Component Date Value Ref Range Status  . WBC 09/18/2013 5.3  4.0 - 10.5 K/uL Final  . RBC 09/18/2013 5.20* 3.87 - 5.11 MIL/uL Final  . Hemoglobin 09/18/2013 14.2  12.0 - 15.0 g/dL Final  . HCT 09/18/2013 40.9  36.0 - 46.0 % Final  . MCV 09/18/2013 78.7  78.0 - 100.0 fL Final  . MCH 09/18/2013 27.3  26.0 - 34.0 pg Final  . MCHC 09/18/2013 34.7  30.0 - 36.0 g/dL Final  . RDW 09/18/2013 15.7* 11.5 - 15.5 % Final  . Platelets 09/18/2013 190  150 - 400 K/uL Final  . Neutrophils Relative % 09/18/2013 51  43 - 77 % Final  . Neutro Abs 09/18/2013 2.7  1.7 - 7.7 K/uL Final  . Lymphocytes Relative 09/18/2013 35  12 - 46 % Final  . Lymphs Abs 09/18/2013 1.9  0.7 - 4.0 K/uL Final  . Monocytes Relative 09/18/2013 8  3 - 12 % Final  . Monocytes Absolute 09/18/2013 0.4  0.1 - 1.0 K/uL Final  . Eosinophils Relative 09/18/2013 6* 0 - 5 % Final  . Eosinophils Absolute 09/18/2013 0.3  0.0 - 0.7 K/uL Final  . Basophils Relative 09/18/2013 0  0 - 1 % Final  . Basophils Absolute 09/18/2013 0.0  0.0 - 0.1 K/uL Final  . Sodium 09/18/2013 145  137 - 147 mEq/L Final  . Potassium 09/18/2013 3.7  3.7 - 5.3 mEq/L Final  . Chloride 09/18/2013 104  96 - 112 mEq/L Final  . CO2 09/18/2013 25  19 - 32 mEq/L Final  . Glucose, Bld 09/18/2013 96  70 -  99 mg/dL Final  . BUN 09/18/2013 9  6 - 23 mg/dL Final  . Creatinine, Ser 09/18/2013 1.08  0.50 - 1.10 mg/dL Final  . Calcium 09/18/2013 8.7  8.4 - 10.5 mg/dL Final  . Total Protein 09/18/2013 6.8  6.0 - 8.3 g/dL Final  . Albumin 09/18/2013 3.1* 3.5 - 5.2 g/dL Final  . AST 09/18/2013 22  0 - 37 U/L Final  . ALT 09/18/2013 13  0 - 35 U/L Final  . Alkaline Phosphatase 09/18/2013 69  39 - 117 U/L Final  . Total Bilirubin 09/18/2013 0.2* 0.3 - 1.2 mg/dL Final  . GFR calc non Af Amer 09/18/2013 45* >90 mL/min Final  . GFR calc Af Amer 09/18/2013 52* >90 mL/min Final   Comment: (NOTE)                          The eGFR has been calculated using the CKD EPI equation.                          This calculation has not been validated in all clinical situations.                          eGFR's persistently <90 mL/min signify possible Chronic Kidney                          Disease.  . Pro B Natriuretic peptide (BNP) 09/18/2013 177.3  0 - 450 pg/mL Final  . Troponin I 09/18/2013 <0.30  <0.30 ng/mL Final   Comment:                                 Due to the release kinetics of cTnI,                          a negative result within the first hours                          of the onset of symptoms does not rule out                          myocardial infarction with certainty.                          If myocardial infarction is still suspected,                          repeat the test at appropriate intervals.  . Color, Urine 09/18/2013 YELLOW  YELLOW Final  . APPearance 09/18/2013 CLEAR  CLEAR Final  . Specific Gravity, Urine 09/18/2013 1.010  1.005 - 1.030 Final  . pH 09/18/2013 5.5  5.0 - 8.0 Final  . Glucose, UA 09/18/2013 NEGATIVE  NEGATIVE mg/dL Final  . Hgb urine dipstick 09/18/2013 NEGATIVE  NEGATIVE Final  . Bilirubin Urine 09/18/2013 NEGATIVE  NEGATIVE Final  . Ketones, ur 09/18/2013 NEGATIVE  NEGATIVE mg/dL Final  . Protein, ur 09/18/2013 NEGATIVE  NEGATIVE mg/dL Final  .  Urobilinogen, UA 09/18/2013 0.2  0.0 - 1.0 mg/dL Final  . Nitrite 09/18/2013 NEGATIVE  NEGATIVE Final  . Leukocytes, UA  09/18/2013 NEGATIVE  NEGATIVE Final   MICROSCOPIC NOT DONE ON URINES WITH NEGATIVE PROTEIN, BLOOD, LEUKOCYTES, NITRITE, OR GLUCOSE <1000 mg/dL.  Marland Kitchen Prothrombin Time 09/18/2013 21.5* 11.6 - 15.2 seconds Final  . INR 09/18/2013 1.93* 0.00 - 1.49 Final  . Sodium 09/19/2013 140  137 - 147 mEq/L Final  . Potassium 09/19/2013 4.9  3.7 - 5.3 mEq/L Final  . Chloride 09/19/2013 103  96 - 112 mEq/L Final  . CO2 09/19/2013 22  19 - 32 mEq/L Final  . Glucose, Bld 09/19/2013 135* 70 - 99 mg/dL Final  . BUN 09/19/2013 12  6 - 23 mg/dL Final  . Creatinine, Ser 09/19/2013 1.03  0.50 - 1.10 mg/dL Final  . Calcium 09/19/2013 8.8  8.4 - 10.5 mg/dL Final  . GFR calc non Af Amer 09/19/2013 47* >90 mL/min Final  . GFR calc Af Amer 09/19/2013 55* >90 mL/min Final   Comment: (NOTE)                          The eGFR has been calculated using the CKD EPI equation.                          This calculation has not been validated in all clinical situations.                          eGFR's persistently <90 mL/min signify possible Chronic Kidney                          Disease.  . WBC 09/19/2013 6.3  4.0 - 10.5 K/uL Final  . RBC 09/19/2013 5.33* 3.87 - 5.11 MIL/uL Final  . Hemoglobin 09/19/2013 14.2  12.0 - 15.0 g/dL Final  . HCT 09/19/2013 42.4  36.0 - 46.0 % Final  . MCV 09/19/2013 79.5  78.0 - 100.0 fL Final  . MCH 09/19/2013 26.6  26.0 - 34.0 pg Final  . MCHC 09/19/2013 33.5  30.0 - 36.0 g/dL Final  . RDW 09/19/2013 16.1* 11.5 - 15.5 % Final  . Platelets 09/19/2013 196  150 - 400 K/uL Final  . Influenza A By PCR 09/19/2013 NEGATIVE  NEGATIVE Final  . Influenza B By PCR 09/19/2013 NEGATIVE  NEGATIVE Final  . H1N1 flu by pcr 09/19/2013 NOT DETECTED  NOT DETECTED Final   Comment:                                 The Xpert Flu assay (FDA approved for                          nasal aspirates or  washes and                          nasopharyngeal swab specimens), is                          intended as an aid in the diagnosis of                          influenza and should not be used as  a sole basis for treatment.  . Prothrombin Time 09/19/2013 21.0* 11.6 - 15.2 seconds Final  . INR 09/19/2013 1.87* 0.00 - 1.49 Final  . Prothrombin Time 09/20/2013 24.7* 11.6 - 15.2 seconds Final  . INR 09/20/2013 2.32* 0.00 - 1.49 Final  . Glucose-Capillary 09/20/2013 136* 70 - 99 mg/dL Final  Office Visit on 08/29/2013  Component Date Value Ref Range Status  . INR 08/29/2013 2.9   Final  Office Visit on 08/01/2013  Component Date Value Ref Range Status  . INR 08/01/2013 2.6   Final  Office Visit on 06/27/2013  Component Date Value Ref Range Status  . INR 06/27/2013 2.0   Final      Assessment/Plan  1. Weakness generalized Should improve a little with time  2. OVERACTIVE BLADDER Hold Myrbetriq  3. Acute asthmatic bronchitis improving  4. Chronic anticoagulation - Protime-INR  5. HYPERTENSION controlled - Comprehensive metabolic panel  6. HYPOTHYROIDISM compensated  7. Leukocytosis recheck - CBC With differential/Platelet  8. BACK PAIN, LUMBAR, CHRONIC - fentaNYL (DURAGESIC - DOSED MCG/HR) 12 MCG/HR; Place 1 patch (12.5 mcg total) onto the skin every 3 (three) days.  Dispense: 10 patch; Refill: 0

## 2013-09-22 DIAGNOSIS — G309 Alzheimer's disease, unspecified: Secondary | ICD-10-CM

## 2013-09-22 DIAGNOSIS — H409 Unspecified glaucoma: Secondary | ICD-10-CM

## 2013-09-22 DIAGNOSIS — J45909 Unspecified asthma, uncomplicated: Secondary | ICD-10-CM

## 2013-09-22 DIAGNOSIS — F0280 Dementia in other diseases classified elsewhere without behavioral disturbance: Secondary | ICD-10-CM

## 2013-09-22 LAB — COMPREHENSIVE METABOLIC PANEL
ALBUMIN: 4.2 g/dL (ref 3.5–4.7)
ALT: 18 IU/L (ref 0–32)
AST: 34 IU/L (ref 0–40)
Albumin/Globulin Ratio: 1.4 (ref 1.1–2.5)
Alkaline Phosphatase: 74 IU/L (ref 39–117)
BUN/Creatinine Ratio: 17 (ref 11–26)
BUN: 21 mg/dL (ref 8–27)
CALCIUM: 9.3 mg/dL (ref 8.7–10.3)
CHLORIDE: 99 mmol/L (ref 97–108)
CO2: 23 mmol/L (ref 18–29)
Creatinine, Ser: 1.22 mg/dL — ABNORMAL HIGH (ref 0.57–1.00)
GFR calc Af Amer: 46 mL/min/{1.73_m2} — ABNORMAL LOW (ref >59)
GFR calc non Af Amer: 40 mL/min/{1.73_m2} — ABNORMAL LOW (ref >59)
GLOBULIN, TOTAL: 2.9 g/dL (ref 1.5–4.5)
GLUCOSE: 100 mg/dL — AB (ref 65–99)
Potassium: 4 mmol/L (ref 3.5–5.2)
Sodium: 145 mmol/L — ABNORMAL HIGH (ref 134–144)
TOTAL PROTEIN: 7.1 g/dL (ref 6.0–8.5)
Total Bilirubin: 0.3 mg/dL (ref 0.0–1.2)

## 2013-09-22 LAB — CBC WITH DIFFERENTIAL
Basophils Absolute: 0 10*3/uL (ref 0.0–0.2)
Basos: 0 %
EOS: 4 %
Eosinophils Absolute: 0.4 10*3/uL (ref 0.0–0.4)
HCT: 41.8 % (ref 34.0–46.6)
HEMOGLOBIN: 14.5 g/dL (ref 11.1–15.9)
IMMATURE GRANS (ABS): 0 10*3/uL (ref 0.0–0.1)
IMMATURE GRANULOCYTES: 0 %
Lymphocytes Absolute: 1.6 10*3/uL (ref 0.7–3.1)
Lymphs: 17 %
MCH: 26.2 pg — AB (ref 26.6–33.0)
MCHC: 34.7 g/dL (ref 31.5–35.7)
MCV: 76 fL — ABNORMAL LOW (ref 79–97)
MONOCYTES: 8 %
Monocytes Absolute: 0.7 10*3/uL (ref 0.1–0.9)
NEUTROS PCT: 71 %
Neutrophils Absolute: 6.9 10*3/uL (ref 1.4–7.0)
Platelets: 197 10*3/uL (ref 150–379)
RBC: 5.54 x10E6/uL — AB (ref 3.77–5.28)
RDW: 15.8 % — ABNORMAL HIGH (ref 12.3–15.4)
WBC: 9.6 10*3/uL (ref 3.4–10.8)

## 2013-09-22 LAB — PROTIME-INR
INR: 3.2 — AB (ref 0.8–1.2)
PROTHROMBIN TIME: 33.9 s — AB (ref 9.1–12.0)

## 2013-09-24 ENCOUNTER — Other Ambulatory Visit: Payer: Self-pay | Admitting: Internal Medicine

## 2013-09-24 NOTE — Progress Notes (Signed)
Received a call from Mrs. Welby's daughter who has noticed she had a dark stool last night, and today, she had some redness in the toilet bowl with red on the toilet tissue.  She suspects hemorrhoids.  She says her mother is otherwise doing fine and not acting any differently.  We discussed that the darker colored stool earlier is more concerning and suggestive of old blood or bleeding further up in her GI tract.  Should she behave differently, become somnolent or have more of the dark stools, I advised she go to the ED, but if she remains stable w/o significant additional bleeding, she can be seen as an acute visit on Monday for blood work and an exam.

## 2013-09-25 ENCOUNTER — Observation Stay (HOSPITAL_COMMUNITY)
Admission: EM | Admit: 2013-09-25 | Discharge: 2013-09-26 | Disposition: A | Discharge status: Home Health | Payer: Medicare Other | Attending: Internal Medicine | Admitting: Internal Medicine

## 2013-09-25 ENCOUNTER — Emergency Department (HOSPITAL_COMMUNITY): Payer: Medicare Other

## 2013-09-25 DIAGNOSIS — Z853 Personal history of malignant neoplasm of breast: Secondary | ICD-10-CM | POA: Insufficient documentation

## 2013-09-25 DIAGNOSIS — M5430 Sciatica, unspecified side: Secondary | ICD-10-CM | POA: Insufficient documentation

## 2013-09-25 DIAGNOSIS — E039 Hypothyroidism, unspecified: Secondary | ICD-10-CM | POA: Insufficient documentation

## 2013-09-25 DIAGNOSIS — M949 Disorder of cartilage, unspecified: Secondary | ICD-10-CM

## 2013-09-25 DIAGNOSIS — E559 Vitamin D deficiency, unspecified: Secondary | ICD-10-CM | POA: Insufficient documentation

## 2013-09-25 DIAGNOSIS — Z96649 Presence of unspecified artificial hip joint: Secondary | ICD-10-CM | POA: Insufficient documentation

## 2013-09-25 DIAGNOSIS — H548 Legal blindness, as defined in USA: Secondary | ICD-10-CM | POA: Insufficient documentation

## 2013-09-25 DIAGNOSIS — G8929 Other chronic pain: Secondary | ICD-10-CM | POA: Insufficient documentation

## 2013-09-25 DIAGNOSIS — J209 Acute bronchitis, unspecified: Secondary | ICD-10-CM | POA: Insufficient documentation

## 2013-09-25 DIAGNOSIS — R0902 Hypoxemia: Secondary | ICD-10-CM

## 2013-09-25 DIAGNOSIS — K921 Melena: Principal | ICD-10-CM | POA: Insufficient documentation

## 2013-09-25 DIAGNOSIS — N318 Other neuromuscular dysfunction of bladder: Secondary | ICD-10-CM | POA: Insufficient documentation

## 2013-09-25 DIAGNOSIS — F0390 Unspecified dementia without behavioral disturbance: Secondary | ICD-10-CM | POA: Insufficient documentation

## 2013-09-25 DIAGNOSIS — Z87891 Personal history of nicotine dependence: Secondary | ICD-10-CM | POA: Insufficient documentation

## 2013-09-25 DIAGNOSIS — J440 Chronic obstructive pulmonary disease with acute lower respiratory infection: Secondary | ICD-10-CM | POA: Insufficient documentation

## 2013-09-25 DIAGNOSIS — M810 Age-related osteoporosis without current pathological fracture: Secondary | ICD-10-CM | POA: Insufficient documentation

## 2013-09-25 DIAGNOSIS — Z8673 Personal history of transient ischemic attack (TIA), and cerebral infarction without residual deficits: Secondary | ICD-10-CM | POA: Insufficient documentation

## 2013-09-25 DIAGNOSIS — G40909 Epilepsy, unspecified, not intractable, without status epilepticus: Secondary | ICD-10-CM | POA: Insufficient documentation

## 2013-09-25 DIAGNOSIS — M899 Disorder of bone, unspecified: Secondary | ICD-10-CM | POA: Insufficient documentation

## 2013-09-25 DIAGNOSIS — R32 Unspecified urinary incontinence: Secondary | ICD-10-CM | POA: Insufficient documentation

## 2013-09-25 DIAGNOSIS — M19049 Primary osteoarthritis, unspecified hand: Secondary | ICD-10-CM | POA: Insufficient documentation

## 2013-09-25 DIAGNOSIS — J219 Acute bronchiolitis, unspecified: Secondary | ICD-10-CM | POA: Diagnosis present

## 2013-09-25 DIAGNOSIS — J9600 Acute respiratory failure, unspecified whether with hypoxia or hypercapnia: Secondary | ICD-10-CM | POA: Insufficient documentation

## 2013-09-25 DIAGNOSIS — I10 Essential (primary) hypertension: Secondary | ICD-10-CM | POA: Insufficient documentation

## 2013-09-25 DIAGNOSIS — H409 Unspecified glaucoma: Secondary | ICD-10-CM | POA: Insufficient documentation

## 2013-09-25 DIAGNOSIS — J218 Acute bronchiolitis due to other specified organisms: Secondary | ICD-10-CM | POA: Insufficient documentation

## 2013-09-25 DIAGNOSIS — Z7901 Long term (current) use of anticoagulants: Secondary | ICD-10-CM | POA: Insufficient documentation

## 2013-09-25 DIAGNOSIS — Z9010 Acquired absence of unspecified breast and nipple: Secondary | ICD-10-CM | POA: Insufficient documentation

## 2013-09-25 DIAGNOSIS — K922 Gastrointestinal hemorrhage, unspecified: Secondary | ICD-10-CM

## 2013-09-25 DIAGNOSIS — F341 Dysthymic disorder: Secondary | ICD-10-CM | POA: Insufficient documentation

## 2013-09-25 DIAGNOSIS — J449 Chronic obstructive pulmonary disease, unspecified: Secondary | ICD-10-CM | POA: Insufficient documentation

## 2013-09-25 DIAGNOSIS — J4489 Other specified chronic obstructive pulmonary disease: Secondary | ICD-10-CM | POA: Insufficient documentation

## 2013-09-25 DIAGNOSIS — M549 Dorsalgia, unspecified: Secondary | ICD-10-CM | POA: Insufficient documentation

## 2013-09-25 HISTORY — DX: Dorsalgia, unspecified: M54.9

## 2013-09-25 HISTORY — DX: Unqualified visual loss, both eyes: H54.3

## 2013-09-25 HISTORY — DX: Anxiety disorder, unspecified: F41.9

## 2013-09-25 HISTORY — DX: Acute embolism and thrombosis of unspecified deep veins of unspecified lower extremity: I82.409

## 2013-09-25 HISTORY — DX: Other chronic pain: G89.29

## 2013-09-25 LAB — CBC WITH DIFFERENTIAL/PLATELET
Basophils Absolute: 0 10*3/uL (ref 0.0–0.1)
Basophils Relative: 1 % (ref 0–1)
EOS ABS: 0.2 10*3/uL (ref 0.0–0.7)
Eosinophils Relative: 3 % (ref 0–5)
HCT: 41.9 % (ref 36.0–46.0)
HEMOGLOBIN: 14.3 g/dL (ref 12.0–15.0)
LYMPHS ABS: 3.1 10*3/uL (ref 0.7–4.0)
Lymphocytes Relative: 37 % (ref 12–46)
MCH: 27 pg (ref 26.0–34.0)
MCHC: 34.1 g/dL (ref 30.0–36.0)
MCV: 79.2 fL (ref 78.0–100.0)
MONOS PCT: 8 % (ref 3–12)
Monocytes Absolute: 0.7 10*3/uL (ref 0.1–1.0)
NEUTROS ABS: 4.3 10*3/uL (ref 1.7–7.7)
Neutrophils Relative %: 51 % (ref 43–77)
PLATELETS: 214 10*3/uL (ref 150–400)
RBC: 5.29 MIL/uL — AB (ref 3.87–5.11)
RDW: 15.7 % — ABNORMAL HIGH (ref 11.5–15.5)
WBC: 8.4 10*3/uL (ref 4.0–10.5)

## 2013-09-25 LAB — TROPONIN I

## 2013-09-25 LAB — CBC
HCT: 41.7 % (ref 36.0–46.0)
Hemoglobin: 14.2 g/dL (ref 12.0–15.0)
MCH: 27 pg (ref 26.0–34.0)
MCHC: 34.1 g/dL (ref 30.0–36.0)
MCV: 79.3 fL (ref 78.0–100.0)
PLATELETS: 197 10*3/uL (ref 150–400)
RBC: 5.26 MIL/uL — ABNORMAL HIGH (ref 3.87–5.11)
RDW: 15.5 % (ref 11.5–15.5)
WBC: 9.6 10*3/uL (ref 4.0–10.5)

## 2013-09-25 LAB — COMPREHENSIVE METABOLIC PANEL
ALBUMIN: 3.1 g/dL — AB (ref 3.5–5.2)
ALK PHOS: 70 U/L (ref 39–117)
ALT: 16 U/L (ref 0–35)
AST: 21 U/L (ref 0–37)
BILIRUBIN TOTAL: 0.2 mg/dL — AB (ref 0.3–1.2)
BUN: 18 mg/dL (ref 6–23)
CHLORIDE: 97 meq/L (ref 96–112)
CO2: 30 mEq/L (ref 19–32)
Calcium: 9.4 mg/dL (ref 8.4–10.5)
Creatinine, Ser: 1.1 mg/dL (ref 0.50–1.10)
GFR calc Af Amer: 50 mL/min — ABNORMAL LOW (ref >90)
GFR calc non Af Amer: 44 mL/min — ABNORMAL LOW (ref >90)
GLUCOSE: 120 mg/dL — AB (ref 70–99)
POTASSIUM: 3.8 meq/L (ref 3.7–5.3)
SODIUM: 141 meq/L (ref 137–147)
TOTAL PROTEIN: 7 g/dL (ref 6.0–8.3)

## 2013-09-25 LAB — POC OCCULT BLOOD, ED: FECAL OCCULT BLD: POSITIVE — AB

## 2013-09-25 LAB — TYPE AND SCREEN
ABO/RH(D): O POS
Antibody Screen: NEGATIVE

## 2013-09-25 LAB — APTT: APTT: 41 s — AB (ref 24–37)

## 2013-09-25 LAB — URINALYSIS, ROUTINE W REFLEX MICROSCOPIC
Bilirubin Urine: NEGATIVE
Glucose, UA: NEGATIVE mg/dL
Ketones, ur: NEGATIVE mg/dL
LEUKOCYTES UA: NEGATIVE
Nitrite: NEGATIVE
Protein, ur: NEGATIVE mg/dL
SPECIFIC GRAVITY, URINE: 1.01 (ref 1.005–1.030)
UROBILINOGEN UA: 0.2 mg/dL (ref 0.0–1.0)
pH: 6.5 (ref 5.0–8.0)

## 2013-09-25 LAB — URINE MICROSCOPIC-ADD ON

## 2013-09-25 LAB — PROTIME-INR
INR: 3.43 — ABNORMAL HIGH (ref 0.00–1.49)
INR: 1.91 — AB (ref 0.00–1.49)
PROTHROMBIN TIME: 33.3 s — AB (ref 11.6–15.2)
PROTHROMBIN TIME: 21.3 s — AB (ref 11.6–15.2)

## 2013-09-25 LAB — PRO B NATRIURETIC PEPTIDE: Pro B Natriuretic peptide (BNP): 187 pg/mL (ref 0–450)

## 2013-09-25 MED ORDER — AZITHROMYCIN 250 MG PO TABS
250.0000 mg | ORAL_TABLET | Freq: Every day | ORAL | Status: DC
  Administered 2013-09-26: 250 mg via ORAL
  Filled 2013-09-25: qty 1

## 2013-09-25 MED ORDER — METHYLPREDNISOLONE 4 MG PO KIT
4.0000 mg | PACK | ORAL | Status: AC
  Administered 2013-09-25: 4 mg via ORAL

## 2013-09-25 MED ORDER — MEMANTINE HCL ER 28 MG PO CP24
28.0000 mg | ORAL_CAPSULE | Freq: Every day | ORAL | Status: DC
  Administered 2013-09-25 – 2013-09-26 (×2): 28 mg via ORAL
  Filled 2013-09-25 (×2): qty 28

## 2013-09-25 MED ORDER — DORZOLAMIDE HCL-TIMOLOL MAL 2-0.5 % OP SOLN
1.0000 [drp] | Freq: Two times a day (BID) | OPHTHALMIC | Status: DC
  Administered 2013-09-25 – 2013-09-26 (×3): 1 [drp] via OPHTHALMIC
  Filled 2013-09-25: qty 10

## 2013-09-25 MED ORDER — HYDROCORTISONE ACETATE 25 MG RE SUPP
25.0000 mg | Freq: Two times a day (BID) | RECTAL | Status: DC
  Administered 2013-09-25 – 2013-09-26 (×3): 25 mg via RECTAL
  Filled 2013-09-25 (×4): qty 1

## 2013-09-25 MED ORDER — ALBUTEROL SULFATE (2.5 MG/3ML) 0.083% IN NEBU: 2.5000 mg | INHALATION_SOLUTION | RESPIRATORY_TRACT | Status: DC | PRN

## 2013-09-25 MED ORDER — SODIUM CHLORIDE 0.9 % IJ SOLN
3.0000 mL | Freq: Two times a day (BID) | INTRAMUSCULAR | Status: DC
  Administered 2013-09-25 – 2013-09-26 (×2): 3 mL via INTRAVENOUS

## 2013-09-25 MED ORDER — IPRATROPIUM-ALBUTEROL 0.5-2.5 (3) MG/3ML IN SOLN: 3.0000 mL | Freq: Four times a day (QID) | RESPIRATORY_TRACT | Status: DC

## 2013-09-25 MED ORDER — METHYLPREDNISOLONE 4 MG PO KIT: 8.0000 mg | PACK | Freq: Every evening | ORAL | Status: DC

## 2013-09-25 MED ORDER — METHYLPREDNISOLONE 4 MG PO KIT: 4.0000 mg | PACK | Freq: Four times a day (QID) | ORAL | Status: DC

## 2013-09-25 MED ORDER — DEXTROSE 5 % IV SOLN
10.0000 mg | Freq: Once | INTRAVENOUS | Status: AC
  Administered 2013-09-25: 10 mg via INTRAVENOUS
  Filled 2013-09-25: qty 1

## 2013-09-25 MED ORDER — METHYLPREDNISOLONE 4 MG PO KIT: 8.0000 mg | PACK | Freq: Every morning | ORAL | Status: DC

## 2013-09-25 MED ORDER — FENTANYL 12 MCG/HR TD PT72
12.5000 ug | MEDICATED_PATCH | TRANSDERMAL | Status: DC
  Administered 2013-09-26: 12.5 ug via TRANSDERMAL
  Filled 2013-09-25 (×2): qty 1

## 2013-09-25 MED ORDER — NIACIN 250 MG PO TABS
250.0000 mg | ORAL_TABLET | Freq: Every day | ORAL | Status: DC
  Administered 2013-09-25: 250 mg via ORAL
  Filled 2013-09-25 (×2): qty 1

## 2013-09-25 MED ORDER — DILTIAZEM GEL 2 %
Freq: Two times a day (BID) | CUTANEOUS | Status: DC
  Filled 2013-09-25: qty 30

## 2013-09-25 MED ORDER — PILOCARPINE HCL 1 % OP SOLN
1.0000 [drp] | Freq: Three times a day (TID) | OPHTHALMIC | Status: DC
  Administered 2013-09-25 – 2013-09-26 (×3): 1 [drp] via OPHTHALMIC

## 2013-09-25 MED ORDER — MOMETASONE FURO-FORMOTEROL FUM 100-5 MCG/ACT IN AERO
2.0000 | INHALATION_SPRAY | Freq: Two times a day (BID) | RESPIRATORY_TRACT | Status: DC
  Administered 2013-09-25 – 2013-09-26 (×2): 2 via RESPIRATORY_TRACT
  Filled 2013-09-25: qty 8.8

## 2013-09-25 MED ORDER — IPRATROPIUM-ALBUTEROL 0.5-2.5 (3) MG/3ML IN SOLN
3.0000 mL | Freq: Four times a day (QID) | RESPIRATORY_TRACT | Status: DC
  Administered 2013-09-25 – 2013-09-26 (×3): 3 mL via RESPIRATORY_TRACT
  Filled 2013-09-25 (×4): qty 3

## 2013-09-25 MED ORDER — LEVOTHYROXINE SODIUM 75 MCG PO TABS
75.0000 ug | ORAL_TABLET | Freq: Every day | ORAL | Status: DC
  Administered 2013-09-25 – 2013-09-26 (×2): 75 ug via ORAL
  Filled 2013-09-25 (×3): qty 1

## 2013-09-25 MED ORDER — METHYLPREDNISOLONE 4 MG PO KIT
8.0000 mg | PACK | Freq: Every morning | ORAL | Status: AC
  Administered 2013-09-25: 8 mg via ORAL
  Filled 2013-09-25: qty 21

## 2013-09-25 MED ORDER — PANTOPRAZOLE SODIUM 40 MG PO TBEC
40.0000 mg | DELAYED_RELEASE_TABLET | Freq: Every day | ORAL | Status: DC
Start: 2013-09-25 — End: 2013-09-26
  Administered 2013-09-25 – 2013-09-26 (×2): 40 mg via ORAL
  Filled 2013-09-25 (×2): qty 1

## 2013-09-25 MED ORDER — METHYLPREDNISOLONE 4 MG PO KIT: 4.0000 mg | PACK | Freq: Three times a day (TID) | ORAL | Status: DC

## 2013-09-25 MED ORDER — METHYLPREDNISOLONE 4 MG PO KIT: 4.0000 mg | PACK | ORAL | Status: DC

## 2013-09-25 MED ORDER — MIRABEGRON ER 25 MG PO TB24
25.0000 mg | ORAL_TABLET | Freq: Every day | ORAL | Status: DC
  Administered 2013-09-25 – 2013-09-26 (×2): 25 mg via ORAL
  Filled 2013-09-25 (×2): qty 1

## 2013-09-25 MED ORDER — LEVETIRACETAM 500 MG PO TABS
500.0000 mg | ORAL_TABLET | Freq: Two times a day (BID) | ORAL | Status: DC
  Administered 2013-09-25 – 2013-09-26 (×3): 500 mg via ORAL
  Filled 2013-09-25 (×4): qty 1

## 2013-09-25 MED ORDER — NIFEDIPINE ER 30 MG PO TB24
30.0000 mg | ORAL_TABLET | Freq: Every day | ORAL | Status: DC
  Administered 2013-09-25 – 2013-09-26 (×2): 30 mg via ORAL
  Filled 2013-09-25 (×2): qty 1

## 2013-09-25 MED ORDER — METHYLPREDNISOLONE 4 MG PO KIT
4.0000 mg | PACK | Freq: Three times a day (TID) | ORAL | Status: DC
  Administered 2013-09-26 (×2): 4 mg via ORAL

## 2013-09-25 MED ORDER — LETROZOLE 2.5 MG PO TABS
2.5000 mg | ORAL_TABLET | Freq: Every day | ORAL | Status: DC
  Administered 2013-09-25: 2.5 mg via ORAL
  Filled 2013-09-25 (×2): qty 1

## 2013-09-25 MED ORDER — DILTIAZEM GEL 2 %
Freq: Two times a day (BID) | CUTANEOUS | Status: DC
Start: 2013-09-25 — End: 2013-09-25
  Filled 2013-09-25: qty 30

## 2013-09-25 MED ORDER — ALBUTEROL SULFATE HFA 108 (90 BASE) MCG/ACT IN AERS
INHALATION_SPRAY | RESPIRATORY_TRACT | Status: AC
  Administered 2013-09-25: 04:00:00
  Filled 2013-09-25: qty 6.7

## 2013-09-25 MED ORDER — GABAPENTIN 300 MG PO CAPS
600.0000 mg | ORAL_CAPSULE | Freq: Every day | ORAL | Status: DC
  Administered 2013-09-25: 600 mg via ORAL
  Filled 2013-09-25 (×2): qty 2

## 2013-09-25 MED ORDER — ACETAMINOPHEN 500 MG PO TABS: 1000.0000 mg | ORAL_TABLET | Freq: Four times a day (QID) | ORAL | Status: DC | PRN

## 2013-09-25 MED ORDER — AZITHROMYCIN 250 MG PO TABS: 500.0000 mg | ORAL_TABLET | Freq: Every day | ORAL | Status: DC

## 2013-09-25 MED ORDER — TIOTROPIUM BROMIDE MONOHYDRATE 18 MCG IN CAPS
18.0000 ug | ORAL_CAPSULE | Freq: Every day | RESPIRATORY_TRACT | Status: DC
  Administered 2013-09-25 – 2013-09-26 (×2): 18 ug via RESPIRATORY_TRACT
  Filled 2013-09-25: qty 5

## 2013-09-25 MED ORDER — METHYLPREDNISOLONE 4 MG PO KIT
8.0000 mg | PACK | Freq: Every evening | ORAL | Status: AC
  Administered 2013-09-25: 8 mg via ORAL

## 2013-09-25 MED ORDER — LATANOPROST 0.005 % OP SOLN
1.0000 [drp] | Freq: Every day | OPHTHALMIC | Status: DC
  Administered 2013-09-25: 1 [drp] via OPHTHALMIC
  Filled 2013-09-25: qty 2.5

## 2013-09-25 MED ORDER — IPRATROPIUM-ALBUTEROL 20-100 MCG/ACT IN AERS: 2.0000 | INHALATION_SPRAY | Freq: Four times a day (QID) | RESPIRATORY_TRACT | Status: DC

## 2013-09-25 MED ORDER — DOCUSATE SODIUM 100 MG PO CAPS
100.0000 mg | ORAL_CAPSULE | Freq: Two times a day (BID) | ORAL | Status: DC
  Administered 2013-09-25 – 2013-09-26 (×3): 100 mg via ORAL
  Filled 2013-09-25 (×3): qty 1

## 2013-09-25 MED ORDER — ALBUTEROL SULFATE (2.5 MG/3ML) 0.083% IN NEBU: 2.5000 mg | INHALATION_SOLUTION | Freq: Four times a day (QID) | RESPIRATORY_TRACT | Status: DC | PRN

## 2013-09-25 MED ORDER — SODIUM CHLORIDE 0.9 % IV SOLN
INTRAVENOUS | Status: DC
  Administered 2013-09-25: 05:00:00 via INTRAVENOUS

## 2013-09-25 MED ORDER — BRIMONIDINE TARTRATE 0.2 % OP SOLN
1.0000 [drp] | Freq: Two times a day (BID) | OPHTHALMIC | Status: DC
  Administered 2013-09-25 – 2013-09-26 (×3): 1 [drp] via OPHTHALMIC
  Filled 2013-09-25: qty 5

## 2013-09-25 MED ORDER — AZITHROMYCIN 250 MG PO TABS: 250.0000 mg | ORAL_TABLET | Freq: Every day | ORAL | Status: DC

## 2013-09-25 MED ORDER — ALBUTEROL SULFATE HFA 108 (90 BASE) MCG/ACT IN AERS: 2.0000 | INHALATION_SPRAY | Freq: Four times a day (QID) | RESPIRATORY_TRACT | Status: DC | PRN

## 2013-09-25 MED ORDER — AZITHROMYCIN 500 MG PO TABS
500.0000 mg | ORAL_TABLET | Freq: Every day | ORAL | Status: AC
  Administered 2013-09-25: 500 mg via ORAL
  Filled 2013-09-25: qty 1

## 2013-09-25 NOTE — ED Notes (Addendum)
Blood stated to be bright red. 2 BM today.

## 2013-09-25 NOTE — Consult Note (Signed)
Consultation  Referring Provider:      Primary Care Physician:  Estill Dooms, MD Primary Gastroenterologist:         Reason for Consultation:              HPI:   Jennifer Brennan is a 80 y.o. female with multiple medical problems, on multiple medications including coumadin. She was brought to ED around midnight after passing blood with BM. Bleeding painless. Patient takes coumadin for history of DVT. INR 3.43. Hgb stable at 14.3.   Daughter is patient's caretaker. Patient has chronic constipation managed with stool softeners. A few days ago patient missed a daily BM, daughter gave her a laxative. Patient subsequently passed some black stool. Later in the evening she passed some bright red blood with BM. Then the blood, "just kept coming".   Per daughter, nurse saw a small rectal tear.   Past Medical History  Diagnosis Date  . Hypertension   . Hypothyroidism   . Seizure disorder   . History of cerebrovascular accident   . Severe stage glaucoma     legally blind  . Depression with anxiety   . OAB (overactive bladder)   . ADENOCARCINOMA, LEFT BREAST 04/11/2010    s/p L mastectomy, on femara x 63yr  . BACK PAIN, LUMBAR, CHRONIC   . DVT 05/2007    RLE following R THR - chronic coumadin, chronic RLE pain  . GLAUCOMA   . OSTEOARTHRITIS, HANDS, BILATERAL   . OSTEOPENIA   . Retinal ischemia   . Sciatica of right side     chronic RLE pain, multifactorial - MRI T/L spine 02/2011  . VITAMIN D DEFICIENCY dx 08/2008  . Venous ulcer of right lower extremity with varicose veins   . Dementia   . Vulvar abscess   . Unspecified hypothyroidism   . Unspecified vitamin D deficiency   . Unspecified urinary incontinence   . Renal disorder   . Varicose veins of lower extremity   . Varicose veins of lower extremities with ulcer   . Unspecified disorder of kidney and ureter   . Stevens-Johnson syndrome   . Sebaceous cyst   . Osteoarthrosis, unspecified whether generalized or localized,  unspecified site   . Disorder of bone and cartilage, unspecified   . Contusion of lower leg   . Cancer   . Depression   . Seizures   . Stroke   . Osteoporosis, unspecified 05/12/2013  . Acute asthmatic bronchitis 09/18/2013    Past Surgical History  Procedure Laterality Date  . Total abdominal hysterectomy    . Total hip arthroplasty  10/2006    right hip  . Refractive surgery      B/L  . Breast surgery  03/2010    breast biopsy, L mastectomy   . Tonsillectomy      Family History  Problem Relation Age of Onset  . Ovarian cancer Mother   . Cancer Mother   . Deep vein thrombosis Mother   . Heart disease Mother   . Arthritis Other     grandparents  . Lung cancer Other   . Heart disease Other     parent  . Cancer Father     BRAIN TUMOR  . Cancer Brother   . Hyperlipidemia Brother   . Hypertension Brother   . Stroke Brother   . Arthritis Brother   . Sickle cell anemia Daughter   . Heart disease Daughter   . Sickle cell anemia Daughter  History  Substance Use Topics  . Smoking status: Former Smoker -- 45 years    Types: Cigarettes    Quit date: 10/16/1979  . Smokeless tobacco: Never Used  . Alcohol Use: No    Prior to Admission medications   Medication Sig Start Date End Date Taking? Authorizing Provider  albuterol (PROVENTIL HFA;VENTOLIN HFA) 108 (90 BASE) MCG/ACT inhaler Inhale 2 puffs into the lungs every 6 (six) hours as needed. For shortness of breath. 01/14/12 06/23/97 Yes Rowe Clack, MD  brimonidine (ALPHAGAN) 0.15 % ophthalmic solution Place 1 drop into both eyes 2 (two) times daily.   Yes Historical Provider, MD  Cholecalciferol 2000 UNITS TABS Take 1 tablet by mouth every morning.   Yes Historical Provider, MD  Cranberry-Vitamin C-Vitamin E 4200-20-3 MG-MG-UNIT CAPS Take by mouth. Take one tablet once a day   Yes Historical Provider, MD  diphenhydrAMINE (BENADRYL) 25 MG tablet Take 12.5 mg by mouth 2 (two) times daily as needed. Pt took while on  antibiotics   Yes Historical Provider, MD  docusate sodium (COLACE) 100 MG capsule Take 100 mg by mouth 2 (two) times daily. Take one tablet twice a day as needed for  Constipation   Yes Historical Provider, MD  dorzolamide-timolol (COSOPT) 22.3-6.8 MG/ML ophthalmic solution Place 1 drop into both eyes 2 (two) times daily.     Yes Historical Provider, MD  fentaNYL (DURAGESIC - DOSED MCG/HR) 12 MCG/HR Place 1 patch (12.5 mcg total) onto the skin every 3 (three) days. 09/21/13  Yes Estill Dooms, MD  Fluticasone-Salmeterol (ADVAIR) 250-50 MCG/DOSE AEPB Inhale 1 puff into the lungs 2 (two) times daily as needed. For shortness of breath 02/14/12  Yes Robbie Lis, MD  gabapentin (NEURONTIN) 300 MG capsule take 2 capsules by mouth at bedtime for pain 06/16/13  Yes Estill Dooms, MD  Ipratropium-Albuterol (COMBIVENT RESPIMAT) 20-100 MCG/ACT AERS respimat Inhale 2 puffs into the lungs every 6 (six) hours. Take 2 puffs 4 times daily to help with breathing.   Yes Historical Provider, MD  latanoprost (XALATAN) 0.005 % ophthalmic solution Place 1 drop into both eyes at bedtime.     Yes Historical Provider, MD  letrozole (FEMARA) 2.5 MG tablet Take 2.5 mg by mouth at bedtime. 05/09/13  Yes Amy Milda Smart, PA-C  levETIRAcetam (KEPPRA) 500 MG tablet Take 500 mg by mouth 2 (two) times daily.   Yes Historical Provider, MD  levothyroxine (SYNTHROID, LEVOTHROID) 75 MCG tablet Take 75 mcg by mouth daily. Take one tablet once a day for thyroid supplement 10/21/12  Yes Estill Dooms, MD  Memantine HCl ER 28 MG CP24 Take 28 mg by mouth daily. One daily to help preserve memory 07/26/13  Yes Estill Dooms, MD  Multiple Vitamins-Minerals (MULTIVITAMIN GUMMIES ADULT) [place] by mouth. Chew two gummi es a day   Yes Historical Provider, MD  MYRBETRIQ 25 MG TB24 tablet Take 25 mg by mouth daily.  09/05/13  Yes Historical Provider, MD  niacin 250 MG tablet Take 250 mg by mouth daily. Take 1 tablet daily @@ 5:00 pm with apple sauce    Yes Historical Provider, MD  NIFEdipine (NIFEDICAL XL) 30 MG 24 hr tablet Take 30 mg by mouth daily.   Yes Historical Provider, MD  omeprazole (PRILOSEC) 20 MG capsule Take 20 mg by mouth every other day. Take 1 tablet every other day to reduce stomach acids. 07/26/13  Yes Estill Dooms, MD  pilocarpine (PILOCAR) 1 % ophthalmic solution Place 1 drop  into both eyes 3 (three) times daily.  08/17/11  Yes Historical Provider, MD  Probiotic Product (ALIGN) 4 MG CAPS Take by mouth. Take 1 capsule daily to help irritable colon. Only while on antibiotics.   Yes Historical Provider, MD  SPIRIVA HANDIHALER 18 MCG inhalation capsule  06/22/13  Yes Historical Provider, MD  warfarin (COUMADIN) 5 MG tablet Take 2.5-5 mg by mouth daily. Take 1 tablet daily except 1/2 tablet on Mondays and Thursdays 05/09/13  Yes Cathey Sabra Heck, RPH-CPP  acetaminophen (TYLENOL) 500 MG tablet Take 1,000 mg by mouth every 6 (six) hours as needed for mild pain, fever or headache. pain    Historical Provider, MD    Current Facility-Administered Medications  Medication Dose Route Frequency Provider Last Rate Last Dose  . 0.9 %  sodium chloride infusion   Intravenous Continuous Etta Quill, DO 75 mL/hr at 09/25/13 0515    . acetaminophen (TYLENOL) tablet 1,000 mg  1,000 mg Oral Q6H PRN Etta Quill, DO      . albuterol (PROVENTIL) (2.5 MG/3ML) 0.083% nebulizer solution 2.5 mg  2.5 mg Inhalation Q6H PRN Etta Quill, DO      . azithromycin [facility]) tablet 500 mg  500 mg Oral Daily Etta Quill, DO       Followed by  . [START ON 09/26/2013] azithromycin (ZITHROMAX) tablet 250 mg  250 mg Oral Daily Etta Quill, DO      . brimonidine (ALPHAGAN) 0.2 % ophthalmic solution 1 drop  1 drop Both Eyes BID Etta Quill, DO      . docusate sodium (COLACE) capsule 100 mg  100 mg Oral BID Jared M Gardner, DO      . dorzolamide-timolol (COSOPT) 22.3-6.8 MG/ML ophthalmic solution 1 drop  1 drop Both Eyes BID Etta Quill, DO       . fentaNYL (DURAGESIC - dosed mcg/hr) 12.5 mcg  12.5 mcg Transdermal Q72H Etta Quill, DO      . gabapentin (NEURONTIN) capsule 600 mg  600 mg Oral QHS Etta Quill, DO      . ipratropium-albuterol (DUONEB) 0.5-2.5 (3) MG/3ML nebulizer solution 3 mL  3 mL Nebulization Q6H Etta Quill, DO      . latanoprost (XALATAN) 0.005 % ophthalmic solution 1 drop  1 drop Both Eyes QHS Etta Quill, DO      . letrozole [facility]) tablet 2.5 mg  2.5 mg Oral QHS Etta Quill, DO      . levETIRAcetam (KEPPRA) tablet 500 mg  500 mg Oral BID Etta Quill, DO      . levothyroxine (SYNTHROID, LEVOTHROID) tablet 75 mcg  75 mcg Oral QAC breakfast Etta Quill, DO   75 mcg at 09/25/13 [id number]  . Memantine HCl ER CP24 28 mg  28 mg Oral Daily Etta Quill, DO      . methylPREDNISolone (MEDROL DOSEPAK) tablet 4 mg  4 mg Oral PC lunch Etta Quill, DO      . methylPREDNISolone (MEDROL DOSEPAK) tablet 4 mg  4 mg Oral PC supper Etta Quill, DO      . [START ON 09/26/2013] methylPREDNISolone (MEDROL DOSEPAK) tablet 4 mg  4 mg Oral 3 x daily with food Etta Quill, DO      . [START ON 09/27/2013] methylPREDNISolone (MEDROL DOSEPAK) tablet 4 mg  4 mg Oral 4X daily taper Etta Quill, DO      . methylPREDNISolone (MEDROL DOSEPAK) tablet 8 mg  8 mg Oral Nightly Etta Quill, DO      . [START ON 09/26/2013] methylPREDNISolone (MEDROL DOSEPAK) tablet 8 mg  8 mg Oral Nightly Etta Quill, DO      . mirabegron ER [facility]) tablet 25 mg  25 mg Oral Daily Etta Quill, DO      . mometasone-formoterol (DULERA) 100-5 MCG/ACT inhaler 2 puff  2 puff Inhalation BID Etta Quill, DO   2 puff at 09/25/13 0805  . niacin tablet 250 mg  250 mg Oral QAC supper Etta Quill, DO      . NIFEdipine (PROCARDIA-XL/ADALAT CC) 24 hr tablet 30 mg  30 mg Oral Daily Jared M Gardner, DO      . pantoprazole (PROTONIX) EC tablet 40 mg  40 mg Oral Daily Jared M Gardner, DO      . pilocarpine (PILOCAR) 1 % ophthalmic  solution 1 drop  1 drop Both Eyes TID Etta Quill, DO      . sodium chloride 0.9 % injection 3 mL  3 mL Intravenous Q12H Etta Quill, DO      . tiotropium [facility]) inhalation capsule 18 mcg  18 mcg Inhalation Daily Etta Quill, DO   18 mcg at 09/25/13 0805    Allergies as of 09/25/2013 - Review Complete 09/25/2013  Allergen Reaction Noted  . Bactrim [sulfamethoxazole-trimethoprim] Other (See Comments) 03/26/2012  . Doxycycline Swelling 07/23/2012  . Furosemide Other (See Comments) 03/26/2012  . Percocet [oxycodone-acetaminophen] Other (See Comments) 03/26/2012  . Valsartan Other (See Comments) 03/26/2012  . Penicillins Hives 07/09/2006  . Morphine sulfate Itching and Rash 07/09/2006  . Sertraline hcl Rash 07/26/2009    Review of Systems:    All systems reviewed and negative except where noted in HPI.   Physical Exam:  Vital signs in last 24 hours: Temp:  [97.4 F (36.3 C)-98.8 F (37.1 C)] 98 F (36.7 C) (04/05 0558) Pulse Rate:  [56-62] 56 (04/05 0558) Resp:  [12-23] 19 (04/05 0558) BP: (156-185)/(58-80) 162/80 mmHg (04/05 0558) SpO2:  [91 %-100 %] 94 % (04/05 0558) Weight:  [164 lb 0.4 oz (74.4 kg)] 164 lb 0.4 oz (74.4 kg) (04/05 0558) Last BM Date: 09/25/13 General:   Pleasant black female in NAD Head:  Normocephalic and atraumatic. Eyes:   No icterus.   Conjunctiva pink. Ears:  Normal auditory acuity. Neck:  Supple; no masses felt Lungs:  Respirations even and unlabored. Inspiratory crackles, wheezing in chest.   Heart:  Regular rate and rhythm Abdomen:  Soft, nondistended, nontender. Normal bowel sounds. No appreciable masses. Old low midline scar Rectal:  No external lesions seen. Attempted DRE but too painful for patient.  Msk:  Symmetrical without gross deformities.  Extremities:  Without edema. Neurologic:  Alert and  oriented x4;  grossly normal neurologically. Skin:  Intact without significant lesions or rashes. Cervical Nodes:  No significant  cervical adenopathy. Psych:  Alert and cooperative. Normal affect.  LAB RESULTS:  Recent Labs  09/25/13 0135  WBC 8.4  HGB 14.3  HCT 41.9  PLT 214   BMET  Recent Labs  09/25/13 0135  NA 141  K 3.8  CL 97  CO2 30  GLUCOSE 120*  BUN 18  CREATININE 1.10  CALCIUM 9.4   LFT  Recent Labs  09/25/13 0135  PROT 7.0  ALBUMIN 3.1*  AST 21  ALT 16  ALKPHOS 70  BILITOT 0.2*   PT/INR  Recent Labs  09/25/13 0135  LABPROT 33.3*  INR  3.43*    STUDIES: Dg Chest Port 1 View  09/25/2013   CLINICAL DATA:  Shortness of breath  EXAM: PORTABLE CHEST - 1 VIEW  COMPARISON:  Prior radiograph from 09/18/2013  FINDINGS: The cardiac and mediastinal silhouettes are stable in size and contour, and remain within normal limits. Atherosclerotic calcifications noted within the aortic arch.  The lungs are hypoinflated. Diffuse peribronchial cuffing again seen within the lungs bilaterally, most evident within knee central perihilar regions. No airspace consolidation, pleural effusion, or pulmonary edema is identified. There is no pneumothorax.  No acute osseous abnormality identified.  IMPRESSION: 1. Diffuse peribronchial cuffing, suggestive of bronchiolitis, mildly progressed relative to prior radiograph from 09/18/2013. No focal infiltrates identified. 2. Shallow lung inflation.   Electronically Signed   By: Jeannine Boga M.D.   On: 09/25/2013 03:58    PREVIOUS ENDOSCOPIES:            none   Impression / Plan:   80 year old female with acute rectal bleeding in setting of anticoagulation. First passage of blood was black per family member, subsequently blood was bright red.   Doubt upper GI bleed, especially given normal BUN. Not sure why one episode of black stool but suspect she did have bleeding from anal fissure (nurse saw a small tear). I couldn't appreciate a fissure but lighting suboptimal. Patient did complain of pain when I attempted DRE so she may very well have a fissure.  Daughter doesn't want endoscopic workup and since hgb is unchanged (14.2), this is reasonable. Will treat empirically for fissure.   Endoscopic workup only if absolutely necessary.   Thanks   LOS: 0 days   Tye Savoy  09/25/2013, 8:41 AM    ________________________________________________________________________  Velora Heckler GI MD note:  I personally examined the patient, reviewed the data and agree with the assessment and plan described above.  Daughter was clear that she is not interested in invasive testing. Bleeding was minor, Hb remains normal, occurred in setting of supratherpuetic INR in paitent with chronic constipation.  She has not had colonoscopy but family and patient were clear they didn't want invasive testing.  At her age and in this setting that is very reasonable. Please call with any further questions or concerns.  OK to d/c home later today or tomorrow from GI standpoint. Try to keep her bowels moving easily with miralax one dose daily and keep her INR in usual range.   Owens Loffler, MD [facility] Pager [phone]

## 2013-09-25 NOTE — Progress Notes (Addendum)
PROGRESS NOTE    Jennifer Brennan TWS:[id number] DOB: [birth year] DOA: 09/25/2013 PCP: Estill Dooms, MD Primary cardiologist: Dr. Sherren Mocha  HPI/Brief narrative 80 year old female patient with history of hypertension, hypothyroid, seizure disorder, COPD/asthma, CVA, legally blind, lower extremity DVTs and dementia on chronic Coumadin, admitted on 09/25/13 with complaints of rectal bleed in the context of supratherapeutic INR. She recently was treated for acute bronchitis on Levaquin and Medrol dose pack. On 4/3, she had an episode of black tarry stools. On 4/4 AM, she had bright red rectal bleeding admixed with brown-colored stool followed by another episode later in the night when she had large amount of bleeding but without clots. No abdominal pain, cramps, nausea or vomiting. No chest pain or dyspnea.   Assessment/Plan:  1. GI bleeding: Suspicious for both upper and lower, in patient with supratherapeutic INR. Coumadin held. Status post a dose of vitamin K and INR decreased from 3.4 > 1.9. Probably a small bleed given stability of hemoglobin. Follow CBC closely. Place on clear liquid diet. GI consulted. Patient apparently has never had EGD or screening colonoscopy. 2. COPD with acute bronchitis: Recently treated with Medrol Dosepak and Levaquin. Chest x-ray without acute findings. Changed to Z-Pak and Medrol Dosepak for persistent symptoms.? Viral-? DC antibiotics. We'll reevaluate tomorrow. The oxygen and bronchodilator nebulizations 3. Hypertension: Mildly uncontrolled 4. Seizure disorder: Continue Keppra 5. History of bilateral lower extremity DVTs: Coumadin on hold.   Code Status: Full Family Communication: Discussed with patient's daughter Ms. Thayer Headings at bedside Disposition Plan: Home when medically stable   Consultants:  Gastroenterology  Procedures:  None  Antibiotics:  Z-Pak 4/5 >   Subjective: No further rectal bleeding since 11:30 PM on 4/4. No abdominal pain  or rectal pain. Denies chest pain, cough or dyspnea.  Objective: Filed Vitals:   09/25/13 0400 09/25/13 0415 09/25/13 0506 09/25/13 0558  BP: 177/72 171/62  162/80  Pulse: 57 59  56  Temp:   97.4 F (36.3 C) 98 F (36.7 C)  TempSrc:    Axillary  Resp: 23 12  19   Height:    5\' 3"  (1.6 m)  Weight:    74.4 kg (164 lb 0.4 oz)  SpO2: 99% 97%  94%   No intake or output data in the 24 hours ending 09/25/13 0946 Filed Weights   09/25/13 0558  Weight: 74.4 kg (164 lb 0.4 oz)     Exam:  General exam: Pleasant elderly female lying comfortably in bed. Respiratory system: Slightly diminished breath sounds bilaterally with scattered few expiratory rhonchi. No increased work of breathing. Cardiovascular system: S1 & S2 heard, RRR. No JVD, murmurs, gallops, clicks or pedal edema. Telemetry: Sinus bradycardia in the 50s. Gastrointestinal system: Abdomen is nondistended, soft and nontender. Normal bowel sounds heard. Central nervous system: Alert and oriented. No focal neurological deficits. Legally blind. Extremities: Symmetric 5 x 5 power.   Data Reviewed: Basic Metabolic Panel:  Recent Labs Lab 09/18/13 1614 09/19/13 0500 09/21/13 1625 09/25/13 0135  NA 145 140 145* 141  K 3.7 4.9 4.0 3.8  CL 104 103 99 97  CO2 25 22 23 30   GLUCOSE 96 135* 100* 120*  BUN 9 12 21 18   CREATININE 1.08 1.03 1.22* 1.10  CALCIUM 8.7 8.8 9.3 9.4   Liver Function Tests:  Recent Labs Lab 09/18/13 1614 09/21/13 1625 09/25/13 0135  AST 22 34 21  ALT 13 18 16   ALKPHOS 69 74 70  BILITOT 0.2* 0.3 0.2*  PROT 6.8 7.1  7.0  ALBUMIN 3.1*  --  3.1*   No results found for this basename: LIPASE, AMYLASE,  in the last 168 hours No results found for this basename: AMMONIA,  in the last 168 hours CBC:  Recent Labs Lab 09/18/13 1614 09/19/13 0500 09/21/13 1625 09/25/13 0135 09/25/13 0904  WBC 5.3 6.3 9.6 8.4 9.6  NEUTROABS 2.7  --  6.9 4.3  --   HGB 14.2 14.2 14.5 14.3 14.2  HCT 40.9 42.4 41.8  41.9 41.7  MCV 78.7 79.5 76* 79.2 79.3  PLT 190 196 197 214 197   Cardiac Enzymes:  Recent Labs Lab 09/18/13 1614 09/25/13 0135  TROPONINI <0.30 <0.30   BNP (last 3 results)  Recent Labs  09/18/13 1614 09/25/13 0135  PROBNP 177.3 187.0   CBG:  Recent Labs Lab 09/20/13 0744  GLUCAP 136*    No results found for this or any previous visit (from the past 240 hour(s)).     Studies: Dg Chest Port 1 View  09/25/2013   CLINICAL DATA:  Shortness of breath  EXAM: PORTABLE CHEST - 1 VIEW  COMPARISON:  Prior radiograph from 09/18/2013  FINDINGS: The cardiac and mediastinal silhouettes are stable in size and contour, and remain within normal limits. Atherosclerotic calcifications noted within the aortic arch.  The lungs are hypoinflated. Diffuse peribronchial cuffing again seen within the lungs bilaterally, most evident within knee central perihilar regions. No airspace consolidation, pleural effusion, or pulmonary edema is identified. There is no pneumothorax.  No acute osseous abnormality identified.  IMPRESSION: 1. Diffuse peribronchial cuffing, suggestive of bronchiolitis, mildly progressed relative to prior radiograph from 09/18/2013. No focal infiltrates identified. 2. Shallow lung inflation.   Electronically Signed   By: Jeannine Boga M.D.   On: 09/25/2013 03:58        Scheduled Meds: . azithromycin  500 mg Oral Daily   Followed by  . [START ON 09/26/2013] azithromycin  250 mg Oral Daily  . brimonidine  1 drop Both Eyes BID  . docusate sodium  100 mg Oral BID  . dorzolamide-timolol  1 drop Both Eyes BID  . fentaNYL  12.5 mcg Transdermal Q72H  . gabapentin  600 mg Oral QHS  . ipratropium-albuterol  3 mL Nebulization Q6H  . latanoprost  1 drop Both Eyes QHS  . letrozole  2.5 mg Oral QHS  . levETIRAcetam  500 mg Oral BID  . levothyroxine  75 mcg Oral QAC breakfast  . Memantine HCl ER  28 mg Oral Daily  . methylPREDNISolone  4 mg Oral PC lunch  . methylPREDNISolone   4 mg Oral PC supper  . [START ON 09/26/2013] methylPREDNISolone  4 mg Oral 3 x daily with food  . [START ON 09/27/2013] methylPREDNISolone  4 mg Oral 4X daily taper  . methylPREDNISolone  8 mg Oral Nightly  . [START ON 09/26/2013] methylPREDNISolone  8 mg Oral Nightly  . mirabegron ER  25 mg Oral Daily  . mometasone-formoterol  2 puff Inhalation BID  . niacin  250 mg Oral QAC supper  . NIFEdipine  30 mg Oral Daily  . pantoprazole  40 mg Oral Daily  . pilocarpine  1 drop Both Eyes TID  . sodium chloride  3 mL Intravenous Q12H  . tiotropium  18 mcg Inhalation Daily   Continuous Infusions:   Principal Problem:   GI bleed Active Problems:   Chronic anticoagulation   Bronchiolitis    Time spent: 45 minutes    HONGALGI,ANAND, MD, FACP, FHM. [facility]  Pager [phone]  If 7PM-7AM, please contact night-coverage [email or web address] Password [email or web address] 09/25/2013, 9:46 AM    LOS: 0 days

## 2013-09-25 NOTE — H&P (Signed)
[facility] History and Physical  Jennifer Brennan [id number] DOB: 02/28/1924 DOA: 09/25/2013  Referring physician: EDP PCP: Estill Dooms, MD   Chief Complaint: GIB   HPI: Jennifer Brennan is a 80 y.o. female who presents to the ED with 2 episodes of hematochezia.  Per daughter, first bloody bowel movement was close to noon, and the 2nd at 11 PM.  Blood was bright red and mixed with stool.  Patient reports no pain with BM.  No abd pain, is on coumadin for DVT some months ago.  INR 3.4 in ED.  Recent hospital stay last week for bronchitis, started on levaquin and medrol dose pak, has been slowely getting worse since then as medrol dose tapered down.  Review of Systems: Systems reviewed.  As above, otherwise negative  Past Medical History  Diagnosis Date  . Hypertension   . Hypothyroidism   . Seizure disorder   . History of cerebrovascular accident   . Severe stage glaucoma     legally blind  . Depression with anxiety   . OAB (overactive bladder)   . ADENOCARCINOMA, LEFT BREAST 04/11/2010    s/p L mastectomy, on femara x 26yr  . BACK PAIN, LUMBAR, CHRONIC   . DVT 05/2007    RLE following R THR - chronic coumadin, chronic RLE pain  . GLAUCOMA   . OSTEOARTHRITIS, HANDS, BILATERAL   . OSTEOPENIA   . Retinal ischemia   . Sciatica of right side     chronic RLE pain, multifactorial - MRI T/L spine 02/2011  . VITAMIN D DEFICIENCY dx 08/2008  . Venous ulcer of right lower extremity with varicose veins   . Dementia   . Vulvar abscess   . Unspecified hypothyroidism   . Unspecified vitamin D deficiency   . Unspecified urinary incontinence   . Renal disorder   . Varicose veins of lower extremity   . Varicose veins of lower extremities with ulcer   . Unspecified disorder of kidney and ureter   . Stevens-Johnson syndrome   . Sebaceous cyst   . Osteoarthrosis, unspecified whether generalized or localized, unspecified site   . Disorder of bone and cartilage, unspecified    . Contusion of lower leg   . Cancer   . Depression   . Seizures   . Stroke   . Osteoporosis, unspecified 05/12/2013  . Acute asthmatic bronchitis 09/18/2013   Past Surgical History  Procedure Laterality Date  . Total abdominal hysterectomy    . Total hip arthroplasty  10/2006    right hip  . Refractive surgery      B/L  . Breast surgery  03/2010    breast biopsy, L mastectomy   . Tonsillectomy     Social History:  reports that she quit smoking about 33 years ago. Her smoking use included Cigarettes. She smoked 0.00 packs per day for 45 years. She has never used smokeless tobacco. She reports that she does not drink alcohol or use illicit drugs.  Allergies  Allergen Reactions  . Bactrim [Sulfamethoxazole-Trimethoprim] Other (See Comments)    MD stopped due to kidney failure Also causes lips to swell  . Doxycycline Swelling    Causes lips to swell, wheezing  . Furosemide Other (See Comments)    MD stopped due to kidney failure  . Percocet [Oxycodone-Acetaminophen] Other (See Comments)    Causes SEIZURES  . Valsartan Other (See Comments)    MD stopped due to kidney failure  . Penicillins Hives  . Morphine Sulfate Itching  and Rash  . Sertraline Hcl Rash    Family History  Problem Relation Age of Onset  . Ovarian cancer Mother   . Cancer Mother   . Deep vein thrombosis Mother   . Heart disease Mother   . Arthritis Other     grandparents  . Lung cancer Other   . Heart disease Other     parent  . Cancer Father     BRAIN TUMOR  . Cancer Brother   . Hyperlipidemia Brother   . Hypertension Brother   . Stroke Brother   . Arthritis Brother   . Sickle cell anemia Daughter   . Heart disease Daughter   . Sickle cell anemia Daughter      Prior to Admission medications   Medication Sig Start Date End Date Taking? Authorizing Provider  albuterol (PROVENTIL HFA;VENTOLIN HFA) 108 (90 BASE) MCG/ACT inhaler Inhale 2 puffs into the lungs every 6 (six) hours as needed. For  shortness of breath. 01/14/12 06/23/97 Yes Rowe Clack, MD  brimonidine (ALPHAGAN) 0.15 % ophthalmic solution Place 1 drop into both eyes 2 (two) times daily.   Yes Historical Provider, MD  Cholecalciferol 2000 UNITS TABS Take 1 tablet by mouth every morning.   Yes Historical Provider, MD  Cranberry-Vitamin C-Vitamin E 4200-20-3 MG-MG-UNIT CAPS Take by mouth. Take one tablet once a day   Yes Historical Provider, MD  diphenhydrAMINE (BENADRYL) 25 MG tablet Take 12.5 mg by mouth 2 (two) times daily as needed. Pt took while on antibiotics   Yes Historical Provider, MD  docusate sodium (COLACE) 100 MG capsule Take 100 mg by mouth 2 (two) times daily. Take one tablet twice a day as needed for  Constipation   Yes Historical Provider, MD  dorzolamide-timolol (COSOPT) 22.3-6.8 MG/ML ophthalmic solution Place 1 drop into both eyes 2 (two) times daily.     Yes Historical Provider, MD  fentaNYL (DURAGESIC - DOSED MCG/HR) 12 MCG/HR Place 1 patch (12.5 mcg total) onto the skin every 3 (three) days. 09/21/13  Yes Estill Dooms, MD  Fluticasone-Salmeterol (ADVAIR) 250-50 MCG/DOSE AEPB Inhale 1 puff into the lungs 2 (two) times daily as needed. For shortness of breath 02/14/12  Yes Robbie Lis, MD  gabapentin (NEURONTIN) 300 MG capsule take 2 capsules by mouth at bedtime for pain 06/16/13  Yes Estill Dooms, MD  Ipratropium-Albuterol (COMBIVENT RESPIMAT) 20-100 MCG/ACT AERS respimat Inhale 2 puffs into the lungs every 6 (six) hours. Take 2 puffs 4 times daily to help with breathing.   Yes Historical Provider, MD  latanoprost (XALATAN) 0.005 % ophthalmic solution Place 1 drop into both eyes at bedtime.     Yes Historical Provider, MD  letrozole (FEMARA) 2.5 MG tablet Take 2.5 mg by mouth at bedtime. 05/09/13  Yes Amy Milda Smart, PA-C  levETIRAcetam (KEPPRA) 500 MG tablet Take 500 mg by mouth 2 (two) times daily.   Yes Historical Provider, MD  levothyroxine (SYNTHROID, LEVOTHROID) 75 MCG tablet Take 75 mcg by mouth  daily. Take one tablet once a day for thyroid supplement 10/21/12  Yes Estill Dooms, MD  Memantine HCl ER 28 MG CP24 Take 28 mg by mouth daily. One daily to help preserve memory 07/26/13  Yes Estill Dooms, MD  Multiple Vitamins-Minerals (MULTIVITAMIN GUMMIES ADULT) [place] by mouth. Chew two gummi es a day   Yes Historical Provider, MD  MYRBETRIQ 25 MG TB24 tablet Take 25 mg by mouth daily.  09/05/13  Yes Historical Provider, MD  niacin  250 MG tablet Take 250 mg by mouth daily. Take 1 tablet daily @@ 5:00 pm with apple sauce   Yes Historical Provider, MD  NIFEdipine (NIFEDICAL XL) 30 MG 24 hr tablet Take 30 mg by mouth daily.   Yes Historical Provider, MD  omeprazole (PRILOSEC) 20 MG capsule Take 20 mg by mouth every other day. Take 1 tablet every other day to reduce stomach acids. 07/26/13  Yes Estill Dooms, MD  pilocarpine (PILOCAR) 1 % ophthalmic solution Place 1 drop into both eyes 3 (three) times daily.  08/17/11  Yes Historical Provider, MD  Probiotic Product (ALIGN) 4 MG CAPS Take by mouth. Take 1 capsule daily to help irritable colon. Only while on antibiotics.   Yes Historical Provider, MD  SPIRIVA HANDIHALER 18 MCG inhalation capsule  06/22/13  Yes Historical Provider, MD  warfarin (COUMADIN) 5 MG tablet Take 2.5-5 mg by mouth daily. Take 1 tablet daily except 1/2 tablet on Mondays and Thursdays 05/09/13  Yes Cathey Miller, RPH-CPP  acetaminophen (TYLENOL) 500 MG tablet Take 1,000 mg by mouth every 6 (six) hours as needed for mild pain, fever or headache. pain    Historical Provider, MD   Physical Exam: Filed Vitals:   09/25/13 0506  BP:   Pulse:   Temp: 97.4 F (36.3 C)  Resp:     BP 171/62  Pulse 59  Temp(Src) 97.4 F (36.3 C) (Rectal)  Resp 12  SpO2 97%  General Appearance:    Alert, oriented, no distress, appears stated age  Head:    Normocephalic, atraumatic  Eyes:    PERRL, EOMI, sclera non-icteric        Nose:   Nares without drainage or epistaxis. Mucosa,  turbinates normal  Throat:   Moist mucous membranes. Oropharynx without erythema or exudate.  Neck:   Supple. No carotid bruits.  No thyromegaly.  No lymphadenopathy.   Back:     No CVA tenderness, no spinal tenderness  Lungs:     Diffuse wheezing bilaterally.  Chest wall:    No tenderness to palpitation  Heart:    Regular rate and rhythm without murmurs, gallops, rubs  Abdomen:     Soft, non-tender, nondistended, normal bowel sounds, no organomegaly  Genitalia:    deferred  Rectal:    deferred  Extremities:   No clubbing, cyanosis or edema.  Pulses:   2+ and symmetric all extremities  Skin:   Skin color, texture, turgor normal, no rashes or lesions  Lymph nodes:   Cervical, supraclavicular, and axillary nodes normal  Neurologic:   CNII-XII intact. Normal strength, sensation and reflexes      throughout    Labs on Admission:  Basic Metabolic Panel:  Recent Labs Lab 09/18/13 1614 09/19/13 0500 09/21/13 1625 09/25/13 0135  NA 145 140 145* 141  K 3.7 4.9 4.0 3.8  CL 104 103 99 97  CO2 25 22 23 30   GLUCOSE 96 135* 100* 120*  BUN 9 12 21 18   CREATININE 1.08 1.03 1.22* 1.10  CALCIUM 8.7 8.8 9.3 9.4   Liver Function Tests:  Recent Labs Lab 09/18/13 1614 09/21/13 1625 09/25/13 0135  AST 22 34 21  ALT 13 18 16   ALKPHOS 69 74 70  BILITOT 0.2* 0.3 0.2*  PROT 6.8 7.1 7.0  ALBUMIN 3.1*  --  3.1*   No results found for this basename: LIPASE, AMYLASE,  in the last 168 hours No results found for this basename: AMMONIA,  in the last 168 hours CBC:  Recent Labs Lab 09/18/13 1614 09/19/13 0500 09/21/13 1625 09/25/13 0135  WBC 5.3 6.3 9.6 8.4  NEUTROABS 2.7  --  6.9 4.3  HGB 14.2 14.2 14.5 14.3  HCT 40.9 42.4 41.8 41.9  MCV 78.7 79.5 76* 79.2  PLT 190 196 197 214   Cardiac Enzymes:  Recent Labs Lab 09/18/13 1614 09/25/13 0135  TROPONINI <0.30 <0.30    BNP (last 3 results)  Recent Labs  09/18/13 1614 09/25/13 0135  PROBNP 177.3 187.0   CBG:  Recent  Labs Lab 09/20/13 0744  GLUCAP 136*    Radiological Exams on Admission: Dg Chest Port 1 View  09/25/2013   CLINICAL DATA:  Shortness of breath  EXAM: PORTABLE CHEST - 1 VIEW  COMPARISON:  Prior radiograph from 09/18/2013  FINDINGS: The cardiac and mediastinal silhouettes are stable in size and contour, and remain within normal limits. Atherosclerotic calcifications noted within the aortic arch.  The lungs are hypoinflated. Diffuse peribronchial cuffing again seen within the lungs bilaterally, most evident within knee central perihilar regions. No airspace consolidation, pleural effusion, or pulmonary edema is identified. There is no pneumothorax.  No acute osseous abnormality identified.  IMPRESSION: 1. Diffuse peribronchial cuffing, suggestive of bronchiolitis, mildly progressed relative to prior radiograph from 09/18/2013. No focal infiltrates identified. 2. Shallow lung inflation.   Electronically Signed   By: Jeannine Boga M.D.   On: 09/25/2013 03:58    EKG: Independently reviewed.  Assessment/Plan Principal Problem:   GI bleed Active Problems:   Chronic anticoagulation   Bronchiolitis   1. GIB - BRBPR, coumadin being reversed with vitamin K, HGB stable at 14, needs GI consult, suspect LGIB.  NPO, NS at 75 cc/hr.  Do not feel patient needs FFP at this point due to infrequency of bleeding and stability of HGB. 2. Bronchiolitis - restarting the medrol dos pak taper, putting patient on Zpak this time since levaquin did not have desired effect.  Adult wheeze protocol.    Code Status: Full Code  Family Communication: Daughter at bedside Disposition Plan: Admit to inpatient   Time spent: 27 min  Sharayah Renfrow M. [facility] Pager [phone]  If 7AM-7PM, please contact the day team taking care of the patient [email or web address] Password [facility] 09/25/2013, 5:24 AM

## 2013-09-25 NOTE — ED Notes (Signed)
Per EDP Pt placed on [place] 2 L/min for O2 sats dropping into mid 80's

## 2013-09-25 NOTE — ED Notes (Signed)
EMS-family reports bowel movement x 2 with blood. Vitals WNL en route. Pt is on blood thinners.

## 2013-09-25 NOTE — ED Provider Notes (Signed)
CSN: [id number]     Arrival date & time 09/25/13  0034 History   First MD Initiated Contact with Patient 09/25/13 0056     Chief Complaint  Patient presents with  . GI Bleeding     (Consider location/radiation/quality/duration/timing/severity/associated sxs/prior Treatment) HPI Patient is brought in by her daughter for 2 bloody bowel movements yesterday. Patient's daughter states that her first bloody bowel movement was close to noon and the second was at 11 PM. The blood was bright red and mixed with stool. The patient reports having no pain with the bowel movements. She denies any abdominal pain at any point. She is on Coumadin for DVT. Unknown last INR. Patient was recently hospitalized for bronchitis and started on Levaquin. She continues to have mild shortness of breath though she denies any chest pain. She's had no fever. She continues to have a cough. Past Medical History  Diagnosis Date  . Hypertension   . Hypothyroidism   . Seizure disorder   . History of cerebrovascular accident   . Severe stage glaucoma     legally blind  . Depression with anxiety   . OAB (overactive bladder)   . ADENOCARCINOMA, LEFT BREAST 04/11/2010    s/p L mastectomy, on femara x 62yr  . BACK PAIN, LUMBAR, CHRONIC   . DVT 05/2007    RLE following R THR - chronic coumadin, chronic RLE pain  . GLAUCOMA   . OSTEOARTHRITIS, HANDS, BILATERAL   . OSTEOPENIA   . Retinal ischemia   . Sciatica of right side     chronic RLE pain, multifactorial - MRI T/L spine 02/2011  . VITAMIN D DEFICIENCY dx 08/2008  . Venous ulcer of right lower extremity with varicose veins   . Dementia   . Vulvar abscess   . Unspecified hypothyroidism   . Unspecified vitamin D deficiency   . Unspecified urinary incontinence   . Renal disorder   . Varicose veins of lower extremity   . Varicose veins of lower extremities with ulcer   . Unspecified disorder of kidney and ureter   . Stevens-Johnson syndrome   . Sebaceous cyst   .  Osteoarthrosis, unspecified whether generalized or localized, unspecified site   . Disorder of bone and cartilage, unspecified   . Contusion of lower leg   . Cancer   . Depression   . Seizures   . Stroke   . Osteoporosis, unspecified 05/12/2013  . Acute asthmatic bronchitis 09/18/2013   Past Surgical History  Procedure Laterality Date  . Total abdominal hysterectomy    . Total hip arthroplasty  10/2006    right hip  . Refractive surgery      B/L  . Breast surgery  03/2010    breast biopsy, L mastectomy   . Tonsillectomy     Family History  Problem Relation Age of Onset  . Ovarian cancer Mother   . Cancer Mother   . Deep vein thrombosis Mother   . Heart disease Mother   . Arthritis Other     grandparents  . Lung cancer Other   . Heart disease Other     parent  . Cancer Father     BRAIN TUMOR  . Cancer Brother   . Hyperlipidemia Brother   . Hypertension Brother   . Stroke Brother   . Arthritis Brother   . Sickle cell anemia Daughter   . Heart disease Daughter   . Sickle cell anemia Daughter    History  Substance Use Topics  .  Smoking status: Former Smoker -- 45 years    Types: Cigarettes    Quit date: 10/16/1979  . Smokeless tobacco: Never Used  . Alcohol Use: No   OB History   Grav Para Term Preterm Abortions TAB SAB Ect Mult Living                 Review of Systems  Constitutional: Negative for fever and chills.  Respiratory: Positive for cough, shortness of breath and wheezing.   Cardiovascular: Negative for chest pain, palpitations and leg swelling.  Gastrointestinal: Positive for blood in stool. Negative for nausea, vomiting, abdominal pain, diarrhea, constipation and abdominal distention.  Genitourinary: Negative for dysuria and flank pain.  Musculoskeletal: Negative for back pain, myalgias, neck pain and neck stiffness.  Skin: Positive for rash. Negative for wound.  Neurological: Negative for dizziness, weakness, light-headedness and numbness.  All  other systems reviewed and are negative.      Allergies  Bactrim; Doxycycline; Furosemide; Percocet; Valsartan; Penicillins; Morphine sulfate; and Sertraline hcl  Home Medications   Current Outpatient Rx  Name  Route  Sig  Dispense  Refill  . albuterol (PROVENTIL HFA;VENTOLIN HFA) 108 (90 BASE) MCG/ACT inhaler   Inhalation   Inhale 2 puffs into the lungs every 6 (six) hours as needed. For shortness of breath.         . brimonidine (ALPHAGAN) 0.15 % ophthalmic solution   Both Eyes   Place 1 drop into both eyes 2 (two) times daily.         . Cholecalciferol 2000 UNITS TABS   Oral   Take 1 tablet by mouth every morning.         . Cranberry-Vitamin C-Vitamin E 4200-20-3 MG-MG-UNIT CAPS   Oral   Take by mouth. Take one tablet once a day         . diphenhydrAMINE (BENADRYL) 25 MG tablet   Oral   Take 12.5 mg by mouth 2 (two) times daily as needed. Pt took while on antibiotics         . docusate sodium (COLACE) 100 MG capsule   Oral   Take 100 mg by mouth 2 (two) times daily. Take one tablet twice a day as needed for  Constipation         . dorzolamide-timolol (COSOPT) 22.3-6.8 MG/ML ophthalmic solution   Both Eyes   Place 1 drop into both eyes 2 (two) times daily.           . fentaNYL (DURAGESIC - DOSED MCG/HR) 12 MCG/HR   Transdermal   Place 1 patch (12.5 mcg total) onto the skin every 3 (three) days.   10 patch   0   . Fluticasone-Salmeterol (ADVAIR) 250-50 MCG/DOSE AEPB   Inhalation   Inhale 1 puff into the lungs 2 (two) times daily as needed. For shortness of breath         . gabapentin (NEURONTIN) 300 MG capsule      take 2 capsules by mouth at bedtime for pain         . Ipratropium-Albuterol (COMBIVENT RESPIMAT) 20-100 MCG/ACT AERS respimat   Inhalation   Inhale 2 puffs into the lungs every 6 (six) hours. Take 2 puffs 4 times daily to help with breathing.         . latanoprost (XALATAN) 0.005 % ophthalmic solution   Both Eyes   Place 1  drop into both eyes at bedtime.           Marland Kitchen letrozole [facility]) 2.5  MG tablet   Oral   Take 2.5 mg by mouth at bedtime.         . levETIRAcetam (KEPPRA) 500 MG tablet   Oral   Take 500 mg by mouth 2 (two) times daily.         Marland Kitchen levofloxacin (LEVAQUIN) 250 MG tablet   Oral   Take 1 tablet (250 mg total) by mouth at bedtime.   3 tablet   0   . levothyroxine (SYNTHROID, LEVOTHROID) 75 MCG tablet   Oral   Take 75 mcg by mouth daily. Take one tablet once a day for thyroid supplement         . Memantine HCl ER 28 MG CP24   Oral   Take 28 mg by mouth daily. One daily to help preserve memory         . methylPREDNISolone (MEDROL DOSEPAK) 4 MG tablet      follow package directions   21 tablet   0   . Multiple Vitamins-Minerals (MULTIVITAMIN GUMMIES ADULT) CHEW   Oral   Chew by mouth. Chew two gummi es a day         . MYRBETRIQ 25 MG TB24 tablet   Oral   Take 25 mg by mouth daily.          . niacin 250 MG tablet   Oral   Take 250 mg by mouth daily. Take 1 tablet daily @@ 5:00 pm with apple sauce         . NIFEdipine (NIFEDICAL XL) 30 MG 24 hr tablet   Oral   Take 30 mg by mouth daily.         Marland Kitchen omeprazole (PRILOSEC) 20 MG capsule   Oral   Take 20 mg by mouth every other day. Take 1 tablet every other day to reduce stomach acids.         . pilocarpine (PILOCAR) 1 % ophthalmic solution   Both Eyes   Place 1 drop into both eyes 3 (three) times daily.          . Probiotic Product (ALIGN) 4 MG CAPS   Oral   Take by mouth. Take 1 capsule daily to help irritable colon. Only while on antibiotics.         Marland Kitchen SPIRIVA HANDIHALER 18 MCG inhalation capsule               . warfarin (COUMADIN) 5 MG tablet   Oral   Take 2.5-5 mg by mouth daily. Take 1 tablet daily except 1/2 tablet on Mondays and Thursdays         . acetaminophen (TYLENOL) 500 MG tablet   Oral   Take 1,000 mg by mouth every 6 (six) hours as needed for mild pain, fever or headache.  pain          BP 185/72  Pulse 62  Temp(Src) 98.8 F (37.1 C) (Rectal)  Resp 18  SpO2 92% Physical Exam  Nursing note and vitals reviewed. Constitutional: She is oriented to person, place, and time. She appears well-developed and well-nourished. No distress.  Patient is blind  HENT:  Head: Normocephalic and atraumatic.  Mouth/Throat: Oropharynx is clear and moist.  Eyes: EOM are normal. Pupils are equal, round, and reactive to light.  Neck: Normal range of motion. Neck supple.  No meningismus  Cardiovascular: Normal rate and regular rhythm.   Pulmonary/Chest: Effort normal. No respiratory distress. She has wheezes. She has rales.  Mild expiratory wheezing and rales in  bilateral lung fields.  Abdominal: Soft. Bowel sounds are normal. She exhibits no distension and no mass. There is no tenderness. There is no rebound and no guarding.  Genitourinary: Guaiac positive stool.  Brown stool  Musculoskeletal: Normal range of motion. She exhibits no edema and no tenderness.  No calf swelling or tenderness.  Neurological: She is alert and oriented to person, place, and time.  Moves all extremities without focal deficit. Sensation is grossly intact.  Skin: Skin is warm and dry. Rash noted. No erythema.  Patient with erythematous petechiae to the dorsum of the left foot.  Psychiatric: She has a normal mood and affect. Her behavior is normal.    ED Course  Procedures (including critical care time) Labs Review Labs Reviewed  CBC WITH DIFFERENTIAL - Abnormal; Notable for the following:    RBC 5.29 (*)    RDW 15.7 (*)    All other components within normal limits  PROTIME-INR - Abnormal; Notable for the following:    Prothrombin Time 33.3 (*)    INR 3.43 (*)    All other components within normal limits  APTT - Abnormal; Notable for the following:    aPTT 41 (*)    All other components within normal limits  POC OCCULT BLOOD, ED - Abnormal; Notable for the following:    Fecal Occult  Bld POSITIVE (*)    All other components within normal limits  COMPREHENSIVE METABOLIC PANEL  TROPONIN I  PRO B NATRIURETIC PEPTIDE  URINALYSIS, ROUTINE W REFLEX MICROSCOPIC  POC OCCULT BLOOD, ED  TYPE AND SCREEN   Imaging Review No results found.   EKG Interpretation   Date/Time:  Sunday September 25 2013 01:45:02 EDT Ventricular Rate:  59 PR Interval:  197 QRS Duration: 85 QT Interval:  466 QTC Calculation: 462 R Axis:   42 Text Interpretation:  Sinus rhythm Nonspecific T abnormalities, lateral  leads Confirmed by Laymond Postle  MD, Pattiann Solanki ([id number]) on 09/25/2013 4:24:10 AM      MDM   Final diagnoses:  None    Patient with O2 saturations dropping into the high heat on room air. She started on oxygen. Her vital signs otherwise are stable at this point. We'll workup for GI bleed and respiratory illness with failed outpatient treatment.  Patient remained stable in the emergency department. No further intestinal bleeding. Hemoglobin is stable. Patient also saturations improved with nasal cannula. Discuss with hospitalist and will admit.  Julianne Rice, MD 09/25/13 [phone]

## 2013-09-26 ENCOUNTER — Other Ambulatory Visit: Payer: Self-pay | Admitting: Internal Medicine

## 2013-09-26 ENCOUNTER — Encounter (HOSPITAL_COMMUNITY): Payer: Self-pay | Admitting: General Practice

## 2013-09-26 DIAGNOSIS — I10 Essential (primary) hypertension: Secondary | ICD-10-CM

## 2013-09-26 DIAGNOSIS — R0902 Hypoxemia: Secondary | ICD-10-CM

## 2013-09-26 LAB — CBC
HCT: 38.4 % (ref 36.0–46.0)
Hemoglobin: 12.8 g/dL (ref 12.0–15.0)
MCH: 26.6 pg (ref 26.0–34.0)
MCHC: 33.3 g/dL (ref 30.0–36.0)
MCV: 79.7 fL (ref 78.0–100.0)
Platelets: 217 10*3/uL (ref 150–400)
RBC: 4.82 MIL/uL (ref 3.87–5.11)
RDW: 15.7 % — AB (ref 11.5–15.5)
WBC: 9.1 10*3/uL (ref 4.0–10.5)

## 2013-09-26 LAB — GLUCOSE, CAPILLARY: Glucose-Capillary: 107 mg/dL — ABNORMAL HIGH (ref 70–99)

## 2013-09-26 LAB — PROTIME-INR
INR: 1.26 (ref 0.00–1.49)
Prothrombin Time: 15.5 seconds — ABNORMAL HIGH (ref 11.6–15.2)

## 2013-09-26 MED ORDER — HYDROCORTISONE ACETATE 25 MG RE SUPP: 25.0000 mg | Freq: Two times a day (BID) | RECTAL | Status: DC

## 2013-09-26 MED ORDER — POLYETHYLENE GLYCOL 3350 17 G PO PACK: 17.0000 g | PACK | Freq: Every day | ORAL | Status: DC

## 2013-09-26 MED ORDER — AZITHROMYCIN 250 MG PO TABS: 250.0000 mg | ORAL_TABLET | Freq: Every day | ORAL | Status: DC

## 2013-09-26 MED ORDER — DILTIAZEM GEL 2 %: 1.0000 "application " | Freq: Two times a day (BID) | CUTANEOUS | Status: DC

## 2013-09-26 MED ORDER — WARFARIN SODIUM 5 MG PO TABS: 2.5000 mg | ORAL_TABLET | Freq: Every day | ORAL | Status: DC

## 2013-09-26 MED ORDER — FLUTICASONE-SALMETEROL 250-50 MCG/DOSE IN AEPB: 1.0000 | INHALATION_SPRAY | Freq: Two times a day (BID) | RESPIRATORY_TRACT | Status: DC

## 2013-09-26 MED ORDER — METHYLPREDNISOLONE 4 MG PO KIT: PACK | ORAL | Status: DC

## 2013-09-26 NOTE — Progress Notes (Signed)
SATURATION QUALIFICATIONS: (This note is used to comply with regulatory documentation for home oxygen)  Patient Saturations on Room Air at Rest = 87%  Patient Saturations on Room Air while Ambulating = NA  Patient Saturations on  Liters of oxygen while Ambulating = NA  Please briefly explain why patient needs home oxygen: O2 saturation at 87% while at rest

## 2013-09-26 NOTE — Progress Notes (Signed)
DC per MD orders; DC IV and tele per MD orders; DC instructions reviewed and signed by patient; no further questions from pt or family; waiting on O2 to be delivered.  Carollee Sires, RN

## 2013-09-26 NOTE — Evaluation (Addendum)
Physical Therapy Evaluation Patient Details Name: Jennifer Brennan MRN: [id number] DOB: August 16, 1923 Today's Date: 09/26/2013   History of Present Illness  80 year old female patient with history of hypertension, hypothyroid, seizure disorder, COPD/asthma, CVA, legally blind, lower extremity DVTs and dementia on chronic Coumadin, admitted on 09/25/13 with complaints of rectal bleed in the context of supratherapeutic INR. She recently was treated for acute bronchitis on Levaquin and Medrol dose pack. On 4/3, she had an episode of black tarry stools. On 4/4 AM, she had bright red rectal bleeding admixed with brown-colored stool followed by another episode later in the night when she had large amount of bleeding but without clots. No abdominal pain, cramps, nausea or vomiting. No chest pain or dyspnea.  Clinical Impression  Patient demonstrates deficits in mobility as listed below. Will need continued skilled PT to address deficits and maximize function. Will see as indicated and progress as tolerated. Recommend HHPT and assist upon discharge.    Follow Up Recommendations Home health PT;Supervision/Assistance - 24 hour    Equipment Recommendations       Recommendations for Other Services       Precautions / Restrictions Precautions Precautions: Fall Precaution Comments: blind Restrictions Weight Bearing Restrictions: No      Mobility  Bed Mobility Overal bed mobility: Needs Assistance Bed Mobility: Supine to Sit;Sit to Supine     Supine to sit: Min guard Sit to supine: Min assist   General bed mobility comments: assist to elevate bilateral LEs back to bed, VCs for positioning in bed  Transfers Overall transfer level: Needs assistance Equipment used: Rolling walker (2 wheeled) Transfers: Sit to/from Stand Sit to Stand: From elevated surface;Min assist         General transfer comment: VCs for upright posture and position within RW, Manual cues for hand placement secondary to  visual impairment  Ambulation/Gait Ambulation/Gait assistance: Min guard;Min assist Ambulation Distance (Feet): 34 Feet Assistive device: Rolling walker (2 wheeled) Gait Pattern/deviations: Decreased stride length;Drifts right/left;Trunk flexed Gait velocity: decreased Gait velocity interpretation: <1.8 ft/sec, indicative of risk for recurrent falls General Gait Details: manual assist to help navigate with max VCs secondary to visual impairments. patient reports weakness but no SOB.  Ambulated on 2 liters SpO2 >90% on 2 liters  Stairs            Wheelchair Mobility    Modified Rankin (Stroke Patients Only)       Balance                                             Pertinent Vitals/Pain Patient does not report pain at this time, SpO2 on room air at rest fluctuating between 94% and 88%, improved with standing.  Ambulated on 2 liters spO2 remained stable at 90-92% with 2 L oxygen.      Home Living Family/patient expects to be discharged to:: Private residence Living Arrangements: Spouse/significant other;Children Available Help at Discharge: Family;Available 24 hours/day;Friend(s) Type of Home: House Home Access: Stairs to enter Entrance Stairs-Rails: Right Entrance Stairs-Number of Steps: 4 Home Layout: One level;Able to live on main level with bedroom/bathroom Home Equipment: Gilford Rile - 2 wheels;Wheelchair - manual;Cane - single point;Bedside commode;Hospital bed      Prior Function Level of Independence: Needs assistance   Gait / Transfers Assistance Needed: Pt ambulates with RW with w/c right behind her.  Daughter states someone is always with  patient when she is up.           Hand Dominance   Dominant Hand: Right    Extremity/Trunk Assessment               Lower Extremity Assessment: Generalized weakness      Cervical / Trunk Assessment: Normal  Communication   Communication:  (BLIND)  Cognition Arousal/Alertness:  Awake/alert Behavior During Therapy: WFL for tasks assessed/performed;Restless;Anxious Overall Cognitive Status: History of cognitive impairments - at baseline                      General Comments      Exercises        Assessment/Plan    PT Assessment Patient needs continued PT services  PT Diagnosis Difficulty walking   PT Problem List Decreased strength;Decreased balance;Decreased mobility;Decreased activity tolerance  PT Treatment Interventions Gait training;Stair training;Functional mobility training;Therapeutic activities;Therapeutic exercise   PT Goals (Current goals can be found in the Care Plan section) Acute Rehab PT Goals Patient Stated Goal: To go home PT Goal Formulation: With patient/family Time For Goal Achievement: 10/10/13 Potential to Achieve Goals: Good    Frequency Min 3X/week   Barriers to discharge        Co-evaluation               End of Session Equipment Utilized During Treatment: Gait belt Activity Tolerance: Treatment limited secondary to agitation Patient left: in bed;with call bell/phone within reach;with family/visitor present Nurse Communication: Mobility status         Time: 6222-9798 PT Time Calculation (min): 29 min   Charges:   PT Evaluation $Initial PT Evaluation Tier I: 1 Procedure PT Treatments $Gait Training: 8-22 mins $Therapeutic Activity: 8-22 mins   PT G Codes:       09-Oct-2013 0940  PT G-Codes **NOT FOR INPATIENT CLASS**  Functional Assessment Tool Used clinical judgement  Functional Limitation Mobility: Walking and moving around  Mobility: Walking and Moving Around Current Status (X2119) CJ  Mobility: Walking and Moving Around Goal Status (E1740) CI        Duncan Dull 2013-10-09, 9:57 AM Alben Deeds, PT DPT  [phone]

## 2013-09-26 NOTE — Discharge Summary (Signed)
Physician Discharge Summary  Jennifer Brennan NIO:[id number] DOB: Jun 13, 1924 DOA: 09/25/2013  PCP: Estill Dooms, MD  Admit date: 09/25/2013 Discharge date: 09/26/2013  Time spent: Less than 30 minutes  Recommendations for Outpatient Follow-up:  1. Jennifer Brennan [facility], in 3 days with repeat labs (CBC, PT, INR) and for Coumadin dose adjustment. 2. Dr. Jeanmarie Hubert, PCP in 5 days. 3. Home health PT, RN and aide 4. Home oxygen at 2 L per minute continuously. 5. Consider outpatient pulmonology consultation.   Discharge Diagnoses:  Principal Problem:   GI bleed Active Problems:   Chronic anticoagulation   Bronchiolitis   Discharge Condition: Improved & Stable  Diet recommendation: Heart healthy diet  Filed Weights   09/25/13 0558  Weight: 74.4 kg (164 lb 0.4 oz)    History of present illness:  80 year old female patient with history of hypertension, hypothyroid, seizure disorder, COPD/asthma, CVA, legally blind, lower extremity DVTs and dementia on chronic Coumadin, admitted on 09/25/13 with complaints of rectal bleed in the context of supratherapeutic INR. She recently was treated for acute bronchitis on Levaquin and Medrol dose pack. On 4/3, she had an episode of black tarry stools. On 4/4 AM, she had bright red rectal bleeding admixed with brown-colored stool followed by another episode later in the night when she had large amount of bleeding but without clots. No abdominal pain, cramps, nausea or vomiting. No chest pain or dyspnea.  Hospital Course:   1. GI bleeding: Patient has not had any further rectal bleeding in the hospital. She had a normal BM on 4/5. Coumadin was held. Her supratherapeutic INR of 3.4 was reversed with vitamin K. Hemoglobin has dropped slightly from 14.2 > 12.8. GI was consulted and felt that the bleeding was minor and felt to be secondary to anal fissure. Family were not interested in invasive testing. At her age and in this setting, this was  felt to be reasonable. She was started on Anusol HC, rectal diltiazem and will add MiraLAX for chronic constipation. Outpatient followup with PCP. 2. COPD with acute bronchitis/bronchiolitis: Recently treated with Medrol Dosepak and Levaquin. Chest x-ray without acute findings. Changed to Z-Pak and Medrol Dosepak for persistent symptoms. May be viral etiology too. Patient denies dyspnea, cough or chest pain. She however continues to be hypoxic on room air at rest. She will complete a total of 5 days of azithromycin, Medrol dosepak taper and will be discharged on home oxygen. Outpatient followup with PCP. 3. Acute hypoxic respiratory failure: Likely secondary to problem #1. Management as above. Consider outpatient pulmonology consultation. 4. Hypertension: Reasonable inpatient control.  5. Seizure disorder: Continue Keppra 6. History of bilateral lower extremity DVTs: Coumadin on held in the hospital and INR was reversed with vitamin K. She will be restarted on a lower than her usual dose of Coumadin while she is still on antibiotics. This can be closely followed and dose adjusted as outpatient in the next couple of days.   Consultations:  Gastroenterology  Procedures:  None    Discharge Exam:  Complaints:  Denies complaints.  Filed Vitals:   09/25/13 1400 09/25/13 2043 09/26/13 0500 09/26/13 0953  BP: 144/77 135/67 152/78   Pulse: 56 56 51   Temp: 98.4 F (36.9 C) 97.7 F (36.5 C) 97.4 F (36.3 C)   TempSrc: Axillary Oral Oral   Resp: 18 19 20    Height:      Weight:      SpO2: 95% 93% 95% 95%    General  exam: Pleasant elderly female lying comfortably in bed.  Respiratory system: Slightly diminished breath sounds bilaterally with scattered few expiratory rhonchi. No increased work of breathing.  Cardiovascular system: S1 & S2 heard, RRR. No JVD, murmurs, gallops, clicks or pedal edema. Telemetry: Sinus bradycardia in the 50s.  Gastrointestinal system: Abdomen is nondistended,  soft and nontender. Normal bowel sounds heard.  Central nervous system: Alert and oriented. No focal neurological deficits. Legally blind.  Extremities: Symmetric 5 x 5 power.   Discharge Instructions      Discharge Orders   Future Appointments Provider Department Dept Phone   10/03/2013 12:00 PM Tivis Brennan, [place] [phone]   12/28/2013 3:00 PM Estill Dooms, MD [facility] [phone]   05/23/2014 3:30 PM [facility] [phone]   05/23/2014 4:00 PM Chauncey Cruel, MD [facility] [phone]   Future Orders Complete By Expires   Call MD for:  difficulty breathing, headache or visual disturbances  As directed    Call MD for:  severe uncontrolled pain  As directed    Call MD for:  As directed    Comments:     Rectal bleeding or black stools.   Diet - low sodium heart healthy  As directed    Discharge instructions  As directed    Comments:     Oxygen via nasal cannula at 2 L per minute continuously.   Increase activity slowly  As directed        Medication List    STOP taking these medications       diphenhydrAMINE 25 MG tablet  Commonly known as:  BENADRYL     levofloxacin 250 MG tablet  Commonly known as:  Nowthen 18 MCG inhalation capsule  Generic drug:  tiotropium      TAKE these medications       acetaminophen 500 MG tablet  Commonly known as:  TYLENOL  Take 1,000 mg by mouth every 6 (six) hours as needed for mild pain, fever or headache. pain     albuterol 108 (90 BASE) MCG/ACT inhaler  Commonly known as:  PROVENTIL HFA;VENTOLIN HFA  Inhale 2 puffs into the lungs every 6 (six) hours as needed. For shortness of breath.     ALIGN 4 MG Caps  Take by mouth. Take 1 capsule daily to help irritable colon. Only while on antibiotics.     azithromycin 250 MG tablet  Commonly known as:  ZITHROMAX  Take 1 tablet (250 mg  total) by mouth daily.     brimonidine 0.15 % ophthalmic solution  Commonly known as:  ALPHAGAN  Place 1 drop into both eyes 2 (two) times daily.     Cholecalciferol 2000 UNITS Tabs  Take 1 tablet by mouth every morning.     COMBIVENT RESPIMAT 20-100 MCG/ACT Aers respimat  Generic drug:  Ipratropium-Albuterol  Inhale 2 puffs into the lungs every 6 (six) hours. Take 2 puffs 4 times daily to help with breathing.     Cranberry-Vitamin C-Vitamin E 4200-20-3 MG-MG-UNIT Caps  Take by mouth. Take one tablet once a day     diltiazem 2 % Gel  Apply 1 application topically 2 (two) times daily. Apply to anal fissures. Place a small amount just inside and outside of anus     docusate sodium 100 MG capsule  Commonly known as:  COLACE  Take 100 mg by mouth 2 (two) times  daily. Take one tablet twice a day as needed for  Constipation     dorzolamide-timolol 22.3-6.8 MG/ML ophthalmic solution  Commonly known as:  COSOPT  Place 1 drop into both eyes 2 (two) times daily.     fentaNYL 12 MCG/HR  Commonly known as:  DURAGESIC - dosed mcg/hr  Place 1 patch (12.5 mcg total) onto the skin every 3 (three) days.     Fluticasone-Salmeterol 250-50 MCG/DOSE Aepb  Commonly known as:  ADVAIR  Inhale 1 puff into the lungs 2 (two) times daily.     gabapentin 300 MG capsule  Commonly known as:  NEURONTIN  take 2 capsules by mouth at bedtime for pain     hydrocortisone 25 MG suppository  Commonly known as:  ANUSOL-HC  Place 1 suppository (25 mg total) rectally 2 (two) times daily.     latanoprost 0.005 % ophthalmic solution  Commonly known as:  XALATAN  Place 1 drop into both eyes at bedtime.     letrozole 2.5 MG tablet  Commonly known as:  FEMARA  Take 2.5 mg by mouth at bedtime.     levETIRAcetam 500 MG tablet  Commonly known as:  KEPPRA  Take 500 mg by mouth 2 (two) times daily.     levothyroxine 75 MCG tablet  Commonly known as:  SYNTHROID, LEVOTHROID  Take 75 mcg by mouth daily. Take one  tablet once a day for thyroid supplement     Memantine HCl ER 28 MG Cp24  Take 28 mg by mouth daily. One daily to help preserve memory     methylPREDNISolone 4 MG tablet  Commonly known as:  MEDROL DOSEPAK  Take as per directions on package.     MULTIVITAMIN GUMMIES ADULT Chew  Chew by mouth. Chew two gummi es a day     MYRBETRIQ 25 MG Tb24 tablet  Generic drug:  mirabegron ER  Take 25 mg by mouth daily.     niacin 250 MG tablet  Take 250 mg by mouth daily. Take 1 tablet daily @@ 5:00 pm with apple sauce     NIFEDICAL XL 30 MG 24 hr tablet  Generic drug:  NIFEdipine  Take 30 mg by mouth daily.     omeprazole 20 MG capsule  Commonly known as:  PRILOSEC  Take 20 mg by mouth every other day. Take 1 tablet every other day to reduce stomach acids.     pilocarpine 1 % ophthalmic solution  Commonly known as:  PILOCAR  Place 1 drop into both eyes 3 (three) times daily.     polyethylene glycol packet  Commonly known as:  MIRALAX  Take 17 g by mouth daily.     warfarin 5 MG tablet  Commonly known as:  COUMADIN  Take 0.5 tablets (2.5 mg total) by mouth daily.       Follow-up Information   Follow up with Tivis Brennan, RPH-CPP. Schedule an appointment as soon as possible for a visit in 3 days. ([facility]. For repeat labs (CBC, PT, INR) and Coumadin dose adjustment.)    Specialty:  Pharmacist   Contact information:   [id number] [address] [phone]       Follow up with GREEN, [name], MD. Schedule an appointment as soon as possible for a visit in 5 days.   Specialty:  Internal Medicine   Contact information:   [place] [address] [phone]        The results of significant diagnostics  from this hospitalization (including imaging, microbiology, ancillary and laboratory) are listed below for reference.    Significant Diagnostic Studies: Dg Chest 2 View  09/18/2013   CLINICAL DATA:  Fever. Sudden onset diffuse torso  and legs rash. Cough. Weakness.  EXAM: CHEST  2 VIEW  COMPARISON:  Previous examinations.  FINDINGS: Normal sized heart. Tortuous and partially calcified thoracic aorta. Central peribronchial thickening with progression since 09/25/2012. Minimal linear scar at the left lung base. Diffuse osteopenia.  IMPRESSION: Mild to moderate central bronchitic changes with progression.   Electronically Signed   By: Enrique Sack M.D.   On: 09/18/2013 15:51   Dg Chest Port 1 View  09/25/2013   CLINICAL DATA:  Shortness of breath  EXAM: PORTABLE CHEST - 1 VIEW  COMPARISON:  Prior radiograph from 09/18/2013  FINDINGS: The cardiac and mediastinal silhouettes are stable in size and contour, and remain within normal limits. Atherosclerotic calcifications noted within the aortic arch.  The lungs are hypoinflated. Diffuse peribronchial cuffing again seen within the lungs bilaterally, most evident within knee central perihilar regions. No airspace consolidation, pleural effusion, or pulmonary edema is identified. There is no pneumothorax.  No acute osseous abnormality identified.  IMPRESSION: 1. Diffuse peribronchial cuffing, suggestive of bronchiolitis, mildly progressed relative to prior radiograph from 09/18/2013. No focal infiltrates identified. 2. Shallow lung inflation.   Electronically Signed   By: Jeannine Boga M.D.   On: 09/25/2013 03:58    Microbiology: No results found for this or any previous visit (from the past 240 hour(s)).   Labs: Basic Metabolic Panel:  Recent Labs Lab 09/21/13 1625 09/25/13 0135  NA 145* 141  K 4.0 3.8  CL 99 97  CO2 23 30  GLUCOSE 100* 120*  BUN 21 18  CREATININE 1.22* 1.10  CALCIUM 9.3 9.4   Liver Function Tests:  Recent Labs Lab 09/21/13 1625 09/25/13 0135  AST 34 21  ALT 18 16  ALKPHOS 74 70  BILITOT 0.3 0.2*  PROT 7.1 7.0  ALBUMIN  --  3.1*   No results found for this basename: LIPASE, AMYLASE,  in the last 168 hours No results found for this basename:  AMMONIA,  in the last 168 hours CBC:  Recent Labs Lab 09/21/13 1625 09/25/13 0135 09/25/13 0904 09/26/13 0545  WBC 9.6 8.4 9.6 9.1  NEUTROABS 6.9 4.3  --   --   HGB 14.5 14.3 14.2 12.8  HCT 41.8 41.9 41.7 38.4  MCV 76* 79.2 79.3 79.7  PLT 197 214 197 217   Cardiac Enzymes:  Recent Labs Lab 09/25/13 0135  TROPONINI <0.30   BNP: BNP (last 3 results)  Recent Labs  09/18/13 1614 09/25/13 0135  PROBNP 177.3 187.0   CBG:  Recent Labs Lab 09/20/13 0744 09/26/13 0559  GLUCAP 136* 107*      Signed:  Vernell Leep, MD, FACP, FHM. [facility] Pager [phone]  If 7PM-7AM, please contact night-coverage [email or web address] Password [email or web address] 09/26/2013, 1:38 PM

## 2013-09-26 NOTE — Care Management Note (Addendum)
Page 1 of 2   09/26/2013     4:12:31 PM   CARE MANAGEMENT NOTE 09/26/2013  Patient:  Jennifer Brennan, Jennifer Brennan   Account Number:  [id number]  Date Initiated:  09/26/2013  Documentation initiated by:  Elorah Dewing  Subjective/Objective Assessment:   PT ADM ON 4/5 WITH GIB.  PTA, PT RESIDES AT [place].  SHE HAS HOSP BED, WALKER, BSC AT HOME.     Action/Plan:   PT WILL NEED HOME HEALTH AND HOME O2 UPON DC.  MET WITH DAUGHTER; REFERRAL TO [facility], PER CHOICE; START OF CARE 24-48H POST DC DATE.   Anticipated DC Date:  09/26/2013   Anticipated DC Plan:  [place]  CM consult      [facility] Choice  HOME HEALTH   Choice offered to / List presented to:  C-1 Patient   DME arranged  [place]      DME agency  [place] arranged  HH-1 RN  [place]      [place].   Status of service:  Completed, signed off Medicare Important Message given?   (If response is "NO", the following Medicare IM given date fields will be blank) Date Medicare IM given:   Date Additional Medicare IM given:    Discharge Disposition:  [place]  Per UR Regulation:  Reviewed for med. necessity/level of care/duration of stay  If discussed at [place] of Stay Meetings, dates discussed:    Comments:

## 2013-09-27 ENCOUNTER — Telehealth: Payer: Self-pay | Admitting: *Deleted

## 2013-09-27 NOTE — Telephone Encounter (Signed)
Jennifer Brennan with [facility] called and wanted to know if they could draw patient's labs in home for the repeat CBC and Protime on the 9th and fax to [place]. Patient has a follow up appointment with Dr. [name] next week to follow up [place] nurse that that was ok and to fax to our office. Agreed.

## 2013-09-29 ENCOUNTER — Telehealth: Payer: Self-pay | Admitting: *Deleted

## 2013-09-29 ENCOUNTER — Other Ambulatory Visit: Payer: Medicare Other

## 2013-09-29 NOTE — Telephone Encounter (Signed)
Patient daughter, Darcus Pester and stated that patient just got out of hospital and now patient has been constipated and has not gone in 4 days. Has been taking Anusol supp., Miralax since April 7. Scared to give enema or laxative due to rectal bleeding. Patient does have an appointment on Monday 4/13 with Cathy. And also on 10/04/2013 with Dr. Nyoka Cowden. Please Advise.

## 2013-09-29 NOTE — Telephone Encounter (Signed)
Jennifer Brennan with [facility] called with Protime results from 09/29/2013  PT:  17.6 INR:  1.5  Current Dose of Coumadin:  2.5mg  everyday Per Dr. Reed---Keep dose the same and have Cathy recheck on Monday at appointment. Patient daughter, Jennifer Brennan, Notified and will keep appointment on Monday to have rechecked with [facility]

## 2013-09-29 NOTE — Telephone Encounter (Signed)
Daughter, Blanch Media, called back and said to disregard message because her mother had a large bowel movement this morning at 4 am per the husband.

## 2013-10-03 ENCOUNTER — Encounter: Payer: Self-pay | Admitting: Pharmacotherapy

## 2013-10-03 ENCOUNTER — Ambulatory Visit (INDEPENDENT_AMBULATORY_CARE_PROVIDER_SITE_OTHER): Payer: Medicare Other | Admitting: Pharmacotherapy

## 2013-10-03 VITALS — BP 140/72 | HR 68 | Resp 10 | Wt 166.0 lb

## 2013-10-03 DIAGNOSIS — I749 Embolism and thrombosis of unspecified artery: Secondary | ICD-10-CM

## 2013-10-03 DIAGNOSIS — Z7901 Long term (current) use of anticoagulants: Secondary | ICD-10-CM

## 2013-10-03 LAB — POCT INR: INR: 1.6

## 2013-10-03 NOTE — Patient Instructions (Signed)
INR 1.6 Coumadin 5mg  today only Then start Coumadin 2.5mg  QD

## 2013-10-03 NOTE — Progress Notes (Signed)
Subjective:     Patient ID: Jennifer Brennan, female   DOB: 03-Feb-1924, 80 y.o.   MRN: [id number]  HPI She has been to the ER twice since last OV.  The first time was for respiratory illness.  She was placed on ABT.  Then again on 09/25/13 with GI bleeding.  INR was 3.4 and she was given vitamin K.  She was found to have a tear in her rectum causing the bleeding.  INR prior to leaving the hospital was 1.26. She is currently taking Coumadin 2.5mg  QD. Still having some trace blood after BM.  Seeing Dr. Nyoka Cowden tomorrow.   Review of Systems  HENT: Negative for nosebleeds.   Respiratory: Negative for shortness of breath.   Cardiovascular: Negative for chest pain.  Gastrointestinal: Positive for blood in stool.  Genitourinary: Negative for hematuria.       Objective:   Physical Exam  Constitutional: She is oriented to person, place, and time. She appears well-developed and well-nourished.  HENT:  Right Ear: External ear normal.  Left Ear: External ear normal.  Eyes:  blind  Cardiovascular: Normal rate, regular rhythm and normal heart sounds.   Pulmonary/Chest: Effort normal and breath sounds normal.  Neurological: She is alert and oriented to person, place, and time.  Skin: Skin is warm and dry.  Psychiatric: She has a normal mood and affect. Her behavior is normal.    BP:  140/72  HR:  68  Wt:  166lb     Assessment:     INR 1.6     Plan:     1.  INR below goal 2-3 due to recent vitamin K administration. 2.  Coumadin 5mg  today only. 3.  Then start Coumadin 2.5mg  QD 4.  RTC in 2 weeks.

## 2013-10-04 ENCOUNTER — Ambulatory Visit (INDEPENDENT_AMBULATORY_CARE_PROVIDER_SITE_OTHER): Payer: Medicare Other | Admitting: Internal Medicine

## 2013-10-04 ENCOUNTER — Encounter: Payer: Self-pay | Admitting: Internal Medicine

## 2013-10-04 VITALS — BP 130/84 | HR 83 | Resp 10 | Wt 166.0 lb

## 2013-10-04 DIAGNOSIS — K922 Gastrointestinal hemorrhage, unspecified: Secondary | ICD-10-CM

## 2013-10-04 DIAGNOSIS — L97919 Non-pressure chronic ulcer of unspecified part of right lower leg with unspecified severity: Secondary | ICD-10-CM

## 2013-10-04 DIAGNOSIS — K5900 Constipation, unspecified: Secondary | ICD-10-CM | POA: Insufficient documentation

## 2013-10-04 DIAGNOSIS — L97909 Non-pressure chronic ulcer of unspecified part of unspecified lower leg with unspecified severity: Secondary | ICD-10-CM

## 2013-10-04 DIAGNOSIS — F0280 Dementia in other diseases classified elsewhere without behavioral disturbance: Secondary | ICD-10-CM

## 2013-10-04 DIAGNOSIS — M79661 Pain in right lower leg: Secondary | ICD-10-CM

## 2013-10-04 DIAGNOSIS — Z7901 Long term (current) use of anticoagulants: Secondary | ICD-10-CM

## 2013-10-04 DIAGNOSIS — M79606 Pain in leg, unspecified: Secondary | ICD-10-CM

## 2013-10-04 DIAGNOSIS — R531 Weakness: Secondary | ICD-10-CM

## 2013-10-04 DIAGNOSIS — R5381 Other malaise: Secondary | ICD-10-CM

## 2013-10-04 DIAGNOSIS — I83019 Varicose veins of right lower extremity with ulcer of unspecified site: Secondary | ICD-10-CM

## 2013-10-04 DIAGNOSIS — G309 Alzheimer's disease, unspecified: Secondary | ICD-10-CM

## 2013-10-04 DIAGNOSIS — M7989 Other specified soft tissue disorders: Secondary | ICD-10-CM

## 2013-10-04 DIAGNOSIS — I10 Essential (primary) hypertension: Secondary | ICD-10-CM

## 2013-10-04 DIAGNOSIS — R5383 Other fatigue: Secondary | ICD-10-CM

## 2013-10-04 DIAGNOSIS — J218 Acute bronchiolitis due to other specified organisms: Secondary | ICD-10-CM

## 2013-10-04 DIAGNOSIS — M79609 Pain in unspecified limb: Secondary | ICD-10-CM

## 2013-10-04 DIAGNOSIS — I83009 Varicose veins of unspecified lower extremity with ulcer of unspecified site: Secondary | ICD-10-CM

## 2013-10-04 DIAGNOSIS — J219 Acute bronchiolitis, unspecified: Secondary | ICD-10-CM

## 2013-10-04 MED ORDER — POLYETHYLENE GLYCOL 3350 17 GM/SCOOP PO POWD: ORAL | Status: DC

## 2013-10-04 NOTE — Patient Instructions (Signed)
Use medication as currently listed

## 2013-10-08 NOTE — Progress Notes (Signed)
Patient ID: AJAH VANHOOSE, female   DOB: April 06, 1924, 80 y.o.   MRN: [id number]    Location:  [facility]   Place of Service: OFFICE    Allergies  Allergen Reactions  . Bactrim [Sulfamethoxazole-Trimethoprim] Other (See Comments)    MD stopped due to kidney failure Also causes lips to swell  . Doxycycline Swelling    Causes lips to swell, wheezing  . Furosemide Other (See Comments)    MD stopped due to kidney failure  . Percocet [Oxycodone-Acetaminophen] Other (See Comments)    Causes SEIZURES  . Valsartan Other (See Comments)    MD stopped due to kidney failure  . Penicillins Hives  . Morphine Sulfate Itching and Rash  . Sertraline Hcl Rash    Chief Complaint  Patient presents with  . Hospitalization Follow-up    ER follow-up from 09/25/13 (seen for GI bleed), patient with blood in stool  last night and blood this am when washing up     HPI:   GI bleed: Hospitalized 09/25/13 through 09/26/13 with red blood coming from her rectum. Patient did not had any further rectal bleeding in the hospital. She had a normal BM on 09/25/13. Coumadin was held. Her supratherapeutic INR of 3.4 was reversed with vitamin K. Hemoglobin has dropped slightly from 14.2 > 12.8. GI was consulted and felt that the bleeding was minor and felt to be secondary to anal fissure. Family were not interested in invasive testing. At her age and in this setting, this was felt to be reasonable. She was started on Anusol HC, rectal diltiazem and will add MiraLAX for chronic constipation. Outpatient followup with PCP.  Chronic anticoagulation: Slightly supratherapeutic in the hospital. Reversed with vitamin K. No further bleeding since discharge.  Alzheimer's disease: Unchanged  Bronchiolitis: Improved. No recent cough.  HYPERTENSION: Controlled  Leg pain: Improved  Leg swelling: Improved  Venous ulcer of right lower extremity with varicose veins: Improved  Weakness generalized: Improved per her daughter  Pain of  right lower leg: Improved  Constipation : Improved on current laxative    Medications: Patient's Medications  New Prescriptions   POLYETHYLENE GLYCOL POWDER (GLYCOLAX) POWDER    17 gram in 4-8 oz fluid daily. Adjust as needed to achieve 1-2 soft stools daily.  Previous Medications   ACETAMINOPHEN (TYLENOL) 500 MG TABLET    Take 1,000 mg by mouth every 6 (six) hours as needed for mild pain, fever or headache. pain   ALBUTEROL (PROVENTIL HFA;VENTOLIN HFA) 108 (90 BASE) MCG/ACT INHALER    Inhale 2 puffs into the lungs every 6 (six) hours as needed. For shortness of breath.   BRIMONIDINE (ALPHAGAN) 0.15 % OPHTHALMIC SOLUTION    Place 1 drop into both eyes 2 (two) times daily.   CHOLECALCIFEROL 2000 UNITS TABS    Take 1 tablet by mouth every morning.   CRANBERRY-VITAMIN C-VITAMIN E 4200-20-3 MG-MG-UNIT CAPS    Take by mouth. Take one tablet once a day   DILTIAZEM 2 % GEL    Apply 1 application topically 2 (two) times daily. Apply to anal fissures. Place a small amount just inside and outside of anus   DORZOLAMIDE-TIMOLOL (COSOPT) 22.3-6.8 MG/ML OPHTHALMIC SOLUTION    Place 1 drop into both eyes 2 (two) times daily.     FENTANYL (DURAGESIC - DOSED MCG/HR) 12 MCG/HR    Place 1 patch (12.5 mcg total) onto the skin every 3 (three) days.   FLUTICASONE-SALMETEROL (ADVAIR) 250-50 MCG/DOSE AEPB    Inhale 1 puff into the lungs  2 (two) times daily.   GABAPENTIN (NEURONTIN) 300 MG CAPSULE    take 2 capsules by mouth at bedtime for pain   HYDROCORTISONE (ANUSOL-HC) 25 MG SUPPOSITORY    Place 1 suppository (25 mg total) rectally 2 (two) times daily.   IPRATROPIUM-ALBUTEROL (COMBIVENT RESPIMAT) 20-100 MCG/ACT AERS RESPIMAT    Inhale 2 puffs into the lungs every 6 (six) hours. Take 2 puffs 4 times daily to help with breathing.   LATANOPROST (XALATAN) 0.005 % OPHTHALMIC SOLUTION    Place 1 drop into both eyes at bedtime.     LETROZOLE (FEMARA) 2.5 MG TABLET    Take 2.5 mg by mouth at bedtime.   LEVETIRACETAM  (KEPPRA) 500 MG TABLET    Take 500 mg by mouth 2 (two) times daily.   LEVOTHYROXINE (SYNTHROID, LEVOTHROID) 75 MCG TABLET    Take 75 mcg by mouth daily. Take one tablet once a day for thyroid supplement   MEMANTINE HCL ER 28 MG CP24    Take 28 mg by mouth daily. One daily to help preserve memory   MULTIPLE VITAMINS-MINERALS (MULTIVITAMIN GUMMIES ADULT) CHEW    Chew by mouth. Chew two gummi es a day   OMEPRAZOLE (PRILOSEC) 20 MG CAPSULE    Take 20 mg by mouth every other day. Take 1 tablet every other day to reduce stomach acids.   PILOCARPINE (PILOCAR) 1 % OPHTHALMIC SOLUTION    Place 1 drop into both eyes 3 (three) times daily.    PROBIOTIC PRODUCT (ALIGN) 4 MG CAPS    Take by mouth. Take 1 capsule daily to help irritable colon. Only while on antibiotics.   WARFARIN (COUMADIN) 5 MG TABLET    Take 0.5 tablets (2.5 mg total) by mouth daily.  Modified Medications   No medications on file  Discontinued Medications   DOCUSATE SODIUM (COLACE) 100 MG CAPSULE    Take 100 mg by mouth 2 (two) times daily. Take one tablet twice a day as needed for  Constipation   METHYLPREDNISOLONE (MEDROL DOSEPAK) 4 MG TABLET    Take as per directions on package.   MYRBETRIQ 25 MG TB24 TABLET    Take 25 mg by mouth daily.    MYRBETRIQ 25 MG TB24 TABLET       NIACIN 250 MG TABLET    Take 250 mg by mouth daily. Take 1 tablet daily @@ 5:00 pm with apple sauce   NIFEDICAL XL 30 MG 24 HR TABLET    take 1 tablet by mouth once daily   NIFEDICAL XL 30 MG 24 HR TABLET       NIFEDIPINE (NIFEDICAL XL) 30 MG 24 HR TABLET    Take 30 mg by mouth daily.   POLYETHYLENE GLYCOL (MIRALAX) PACKET    Take 17 g by mouth daily.     Review of Systems  Constitutional: Positive for fatigue. Negative for fever, diaphoresis, activity change, appetite change and unexpected weight change.  HENT: Positive for hearing loss. Negative for congestion and ear pain.   Eyes: Positive for visual disturbance.       Prescription lenses  Respiratory:  Negative for apnea, cough, choking, chest tightness, shortness of breath and wheezing.   Cardiovascular: Positive for leg swelling. Negative for chest pain and palpitations.  Gastrointestinal: Negative for abdominal pain and abdominal distention.  Endocrine: Negative for cold intolerance, polydipsia, polyphagia and polyuria.  Genitourinary: Positive for frequency. Negative for decreased urine volume, difficulty urinating and pelvic pain.       Urinary incontinence.  Musculoskeletal:  Positive for arthralgias, gait problem and myalgias. Negative for back pain, joint swelling, neck pain and neck stiffness.  Skin:       Vasculitic peppered red rash on both legs and feet. Associated with venous insufficiency and chronic edema. The area is not warm. It is mildly tender to touch.  Allergic/Immunologic: Negative.   Neurological: Positive for weakness. Negative for dizziness, tremors, seizures, syncope, facial asymmetry, speech difficulty, light-headedness, numbness and headaches.  Hematological: Negative.   Psychiatric/Behavioral: Positive for confusion, sleep disturbance, decreased concentration and agitation. Negative for suicidal ideas, hallucinations and behavioral problems. The patient is not nervous/anxious and is not hyperactive.     Filed Vitals:   10/04/13 1403  BP: 130/84  Pulse: 83  Resp: 10  Weight: 166 lb (75.297 kg)  SpO2: 84%   Physical Exam  Constitutional: No distress.  HENT:  Head: Normocephalic and atraumatic.  Diminished hearing bilaterally. Exposure of root of the lower left canine.  Eyes: Conjunctivae and EOM are normal. Left eye exhibits no discharge.  Neck: Normal range of motion. Neck supple. No JVD present. No tracheal deviation present. No thyromegaly present.  Cardiovascular: Normal rate, regular rhythm and normal heart sounds.  Exam reveals no gallop and no friction rub.   No murmur heard. Pulmonary/Chest: Effort normal and breath sounds normal. No respiratory  distress. She has no wheezes. She has no rales.  Abdominal: Bowel sounds are normal. She exhibits no distension and no mass. There is no tenderness.  Musculoskeletal: Normal range of motion. She exhibits edema and tenderness.  Tender at both knees. Tender in the right groin area.  Lymphadenopathy:    She has no cervical adenopathy.  Neurological: She is alert. No cranial nerve deficit. Coordination normal.  Memory deficit.  Skin: Skin is warm and dry. No rash noted. She is not diaphoretic. No erythema. No pallor.  Waxy scabs on both legs. Vasculitic ras on both legs and feet: red peppered macules.  Psychiatric:  Significant dementia. Calm at our office today.. Improvement episodes of agitation at home.     Labs reviewed: Office Visit on 10/03/2013  Component Date Value Ref Range Status  . INR 10/03/2013 1.6   Final  Admission on 09/25/2013, Discharged on 09/26/2013  Component Date Value Ref Range Status  . Fecal Occult Bld 09/25/2013 POSITIVE* NEGATIVE Final  . WBC 09/25/2013 8.4  4.0 - 10.5 K/uL Final  . RBC 09/25/2013 5.29* 3.87 - 5.11 MIL/uL Final  . Hemoglobin 09/25/2013 14.3  12.0 - 15.0 g/dL Final  . HCT 09/25/2013 41.9  36.0 - 46.0 % Final  . MCV 09/25/2013 79.2  78.0 - 100.0 fL Final  . MCH 09/25/2013 27.0  26.0 - 34.0 pg Final  . MCHC 09/25/2013 34.1  30.0 - 36.0 g/dL Final  . RDW 09/25/2013 15.7* 11.5 - 15.5 % Final  . Platelets 09/25/2013 214  150 - 400 K/uL Final  . Neutrophils Relative % 09/25/2013 51  43 - 77 % Final  . Neutro Abs 09/25/2013 4.3  1.7 - 7.7 K/uL Final  . Lymphocytes Relative 09/25/2013 37  12 - 46 % Final  . Lymphs Abs 09/25/2013 3.1  0.7 - 4.0 K/uL Final  . Monocytes Relative 09/25/2013 8  3 - 12 % Final  . Monocytes Absolute 09/25/2013 0.7  0.1 - 1.0 K/uL Final  . Eosinophils Relative 09/25/2013 3  0 - 5 % Final  . Eosinophils Absolute 09/25/2013 0.2  0.0 - 0.7 K/uL Final  . Basophils Relative 09/25/2013 1  0 -  1 % Final  . Basophils Absolute  09/25/2013 0.0  0.0 - 0.1 K/uL Final  . Sodium 09/25/2013 141  137 - 147 mEq/L Final  . Potassium 09/25/2013 3.8  3.7 - 5.3 mEq/L Final  . Chloride 09/25/2013 97  96 - 112 mEq/L Final  . CO2 09/25/2013 30  19 - 32 mEq/L Final  . Glucose, Bld 09/25/2013 120* 70 - 99 mg/dL Final  . BUN 09/25/2013 18  6 - 23 mg/dL Final  . Creatinine, Ser 09/25/2013 1.10  0.50 - 1.10 mg/dL Final  . Calcium 09/25/2013 9.4  8.4 - 10.5 mg/dL Final  . Total Protein 09/25/2013 7.0  6.0 - 8.3 g/dL Final  . Albumin 09/25/2013 3.1* 3.5 - 5.2 g/dL Final  . AST 09/25/2013 21  0 - 37 U/L Final  . ALT 09/25/2013 16  0 - 35 U/L Final  . Alkaline Phosphatase 09/25/2013 70  39 - 117 U/L Final  . Total Bilirubin 09/25/2013 0.2* 0.3 - 1.2 mg/dL Final  . GFR calc non Af Amer 09/25/2013 44* >90 mL/min Final  . GFR calc Af Amer 09/25/2013 50* >90 mL/min Final   Comment: (NOTE)                          The eGFR has been calculated using the CKD EPI equation.                          This calculation has not been validated in all clinical situations.                          eGFR's persistently <90 mL/min signify possible Chronic Kidney                          Disease.  Marland Kitchen Prothrombin Time 09/25/2013 33.3* 11.6 - 15.2 seconds Final  . INR 09/25/2013 3.43* 0.00 - 1.49 Final  . Troponin I 09/25/2013 <0.30  <0.30 ng/mL Final   Comment:                                 Due to the release kinetics of cTnI,                          a negative result within the first hours                          of the onset of symptoms does not rule out                          myocardial infarction with certainty.                          If myocardial infarction is still suspected,                          repeat the test at appropriate intervals.  . Pro B Natriuretic peptide (BNP) 09/25/2013 187.0  0 - 450 pg/mL Final  . Color, Urine 09/25/2013 YELLOW  YELLOW Final  . APPearance 09/25/2013 CLEAR  CLEAR Final  . Specific Gravity, Urine  09/25/2013 1.010  1.005 -  1.030 Final  . pH 09/25/2013 6.5  5.0 - 8.0 Final  . Glucose, UA 09/25/2013 NEGATIVE  NEGATIVE mg/dL Final  . Hgb urine dipstick 09/25/2013 MODERATE* NEGATIVE Final  . Bilirubin Urine 09/25/2013 NEGATIVE  NEGATIVE Final  . Ketones, ur 09/25/2013 NEGATIVE  NEGATIVE mg/dL Final  . Protein, ur 09/25/2013 NEGATIVE  NEGATIVE mg/dL Final  . Urobilinogen, UA 09/25/2013 0.2  0.0 - 1.0 mg/dL Final  . Nitrite 09/25/2013 NEGATIVE  NEGATIVE Final  . Leukocytes, UA 09/25/2013 NEGATIVE  NEGATIVE Final  . aPTT 09/25/2013 41* 24 - 37 seconds Final   Comment:                                 IF BASELINE aPTT IS ELEVATED,                          SUGGEST PATIENT RISK ASSESSMENT                          BE USED TO DETERMINE APPROPRIATE                          ANTICOAGULANT THERAPY.  . ABO/RH(D) 09/25/2013 O POS   Final  . Antibody Screen 09/25/2013 NEG   Final  . Sample Expiration 09/25/2013 09/28/2013   Final  . Squamous Epithelial / LPF 09/25/2013 RARE  RARE Final  . RBC / HPF 09/25/2013 3-6  <3 RBC/hpf Final  . Bacteria, UA 09/25/2013 RARE  RARE Final  . WBC 09/25/2013 9.6  4.0 - 10.5 K/uL Final  . RBC 09/25/2013 5.26* 3.87 - 5.11 MIL/uL Final  . Hemoglobin 09/25/2013 14.2  12.0 - 15.0 g/dL Final  . HCT 09/25/2013 41.7  36.0 - 46.0 % Final  . MCV 09/25/2013 79.3  78.0 - 100.0 fL Final  . MCH 09/25/2013 27.0  26.0 - 34.0 pg Final  . MCHC 09/25/2013 34.1  30.0 - 36.0 g/dL Final  . RDW 09/25/2013 15.5  11.5 - 15.5 % Final  . Platelets 09/25/2013 197  150 - 400 K/uL Final  . Prothrombin Time 09/25/2013 21.3* 11.6 - 15.2 seconds Final  . INR 09/25/2013 1.91* 0.00 - 1.49 Final  . WBC 09/26/2013 9.1  4.0 - 10.5 K/uL Final  . RBC 09/26/2013 4.82  3.87 - 5.11 MIL/uL Final  . Hemoglobin 09/26/2013 12.8  12.0 - 15.0 g/dL Final  . HCT 09/26/2013 38.4  36.0 - 46.0 % Final  . MCV 09/26/2013 79.7  78.0 - 100.0 fL Final  . MCH 09/26/2013 26.6  26.0 - 34.0 pg Final  . MCHC 09/26/2013  33.3  30.0 - 36.0 g/dL Final  . RDW 09/26/2013 15.7* 11.5 - 15.5 % Final  . Platelets 09/26/2013 217  150 - 400 K/uL Final  . Glucose-Capillary 09/26/2013 107* 70 - 99 mg/dL Final  . Comment 1 09/26/2013 Documented in Chart   Final  . Comment 2 09/26/2013 Notify RN   Final  . Prothrombin Time 09/26/2013 15.5* 11.6 - 15.2 seconds Final  . INR 09/26/2013 1.26  0.00 - 1.49 Final  Office Visit on 09/21/2013  Component Date Value Ref Range Status  . WBC 09/21/2013 9.6  3.4 - 10.8 x10E3/uL Final  . RBC 09/21/2013 5.54* 3.77 - 5.28 x10E6/uL Final  . Hemoglobin 09/21/2013 14.5  11.1 - 15.9 g/dL Final  . HCT 09/21/2013  41.8  34.0 - 46.6 % Final  . MCV 09/21/2013 76* 79 - 97 fL Final  . MCH 09/21/2013 26.2* 26.6 - 33.0 pg Final  . MCHC 09/21/2013 34.7  31.5 - 35.7 g/dL Final  . RDW 09/21/2013 15.8* 12.3 - 15.4 % Final  . Platelets 09/21/2013 197  150 - 379 x10E3/uL Final  . Neutrophils Relative % 09/21/2013 71   Final  . Lymphs 09/21/2013 17   Final  . Monocytes 09/21/2013 8   Final  . Eos 09/21/2013 4   Final  . Basos 09/21/2013 0   Final  . Neutrophils Absolute 09/21/2013 6.9  1.4 - 7.0 x10E3/uL Final  . Lymphocytes Absolute 09/21/2013 1.6  0.7 - 3.1 x10E3/uL Final  . Monocytes Absolute 09/21/2013 0.7  0.1 - 0.9 x10E3/uL Final  . Eosinophils Absolute 09/21/2013 0.4  0.0 - 0.4 x10E3/uL Final  . Basophils Absolute 09/21/2013 0.0  0.0 - 0.2 x10E3/uL Final  . Immature Granulocytes 09/21/2013 0   Final  . Immature Grans (Abs) 09/21/2013 0.0  0.0 - 0.1 x10E3/uL Final  . Hematology Comments: 09/21/2013 Note:   Final   Verified by microscopic examination.  . Glucose 09/21/2013 100* 65 - 99 mg/dL Final  . BUN 09/21/2013 21  8 - 27 mg/dL Final  . Creatinine, Ser 09/21/2013 1.22* 0.57 - 1.00 mg/dL Final  . GFR calc non Af Amer 09/21/2013 40* >59 mL/min/1.73 Final  . GFR calc Af Amer 09/21/2013 46* >59 mL/min/1.73 Final  . BUN/Creatinine Ratio 09/21/2013 17  11 - 26 Final  . Sodium 09/21/2013 145*  134 - 144 mmol/L Final  . Potassium 09/21/2013 4.0  3.5 - 5.2 mmol/L Final  . Chloride 09/21/2013 99  97 - 108 mmol/L Final  . CO2 09/21/2013 23  18 - 29 mmol/L Final  . Calcium 09/21/2013 9.3  8.7 - 10.3 mg/dL Final  . Total Protein 09/21/2013 7.1  6.0 - 8.5 g/dL Final  . Albumin 09/21/2013 4.2  3.5 - 4.7 g/dL Final  . Globulin, Total 09/21/2013 2.9  1.5 - 4.5 g/dL Final  . Albumin/Globulin Ratio 09/21/2013 1.4  1.1 - 2.5 Final  . Total Bilirubin 09/21/2013 0.3  0.0 - 1.2 mg/dL Final  . Alkaline Phosphatase 09/21/2013 74  39 - 117 IU/L Final  . AST 09/21/2013 34  0 - 40 IU/L Final  . ALT 09/21/2013 18  0 - 32 IU/L Final  . INR 09/21/2013 3.2* 0.8 - 1.2 Final   Comment: Reference interval is for non-anticoagulated patients.                          Suggested INR therapeutic range for Vitamin K                          antagonist therapy:                             Standard Dose (moderate intensity                                            therapeutic range):       2.0 - 3.0  Higher intensity therapeutic range       2.5 - 3.5  . Prothrombin Time 09/21/2013 33.9* 9.1 - 12.0 sec Final  Admission on 09/18/2013, Discharged on 09/20/2013  Component Date Value Ref Range Status  . WBC 09/18/2013 5.3  4.0 - 10.5 K/uL Final  . RBC 09/18/2013 5.20* 3.87 - 5.11 MIL/uL Final  . Hemoglobin 09/18/2013 14.2  12.0 - 15.0 g/dL Final  . HCT 09/18/2013 40.9  36.0 - 46.0 % Final  . MCV 09/18/2013 78.7  78.0 - 100.0 fL Final  . MCH 09/18/2013 27.3  26.0 - 34.0 pg Final  . MCHC 09/18/2013 34.7  30.0 - 36.0 g/dL Final  . RDW 09/18/2013 15.7* 11.5 - 15.5 % Final  . Platelets 09/18/2013 190  150 - 400 K/uL Final  . Neutrophils Relative % 09/18/2013 51  43 - 77 % Final  . Neutro Abs 09/18/2013 2.7  1.7 - 7.7 K/uL Final  . Lymphocytes Relative 09/18/2013 35  12 - 46 % Final  . Lymphs Abs 09/18/2013 1.9  0.7 - 4.0 K/uL Final  . Monocytes Relative 09/18/2013 8  3 - 12 % Final  .  Monocytes Absolute 09/18/2013 0.4  0.1 - 1.0 K/uL Final  . Eosinophils Relative 09/18/2013 6* 0 - 5 % Final  . Eosinophils Absolute 09/18/2013 0.3  0.0 - 0.7 K/uL Final  . Basophils Relative 09/18/2013 0  0 - 1 % Final  . Basophils Absolute 09/18/2013 0.0  0.0 - 0.1 K/uL Final  . Sodium 09/18/2013 145  137 - 147 mEq/L Final  . Potassium 09/18/2013 3.7  3.7 - 5.3 mEq/L Final  . Chloride 09/18/2013 104  96 - 112 mEq/L Final  . CO2 09/18/2013 25  19 - 32 mEq/L Final  . Glucose, Bld 09/18/2013 96  70 - 99 mg/dL Final  . BUN 09/18/2013 9  6 - 23 mg/dL Final  . Creatinine, Ser 09/18/2013 1.08  0.50 - 1.10 mg/dL Final  . Calcium 09/18/2013 8.7  8.4 - 10.5 mg/dL Final  . Total Protein 09/18/2013 6.8  6.0 - 8.3 g/dL Final  . Albumin 09/18/2013 3.1* 3.5 - 5.2 g/dL Final  . AST 09/18/2013 22  0 - 37 U/L Final  . ALT 09/18/2013 13  0 - 35 U/L Final  . Alkaline Phosphatase 09/18/2013 69  39 - 117 U/L Final  . Total Bilirubin 09/18/2013 0.2* 0.3 - 1.2 mg/dL Final  . GFR calc non Af Amer 09/18/2013 45* >90 mL/min Final  . GFR calc Af Amer 09/18/2013 52* >90 mL/min Final   Comment: (NOTE)                          The eGFR has been calculated using the CKD EPI equation.                          This calculation has not been validated in all clinical situations.                          eGFR's persistently <90 mL/min signify possible Chronic Kidney                          Disease.  . Pro B Natriuretic peptide (BNP) 09/18/2013 177.3  0 - 450 pg/mL Final  . Troponin I 09/18/2013 <0.30  <0.30 ng/mL Final   Comment:  Due to the release kinetics of cTnI,                          a negative result within the first hours                          of the onset of symptoms does not rule out                          myocardial infarction with certainty.                          If myocardial infarction is still suspected,                          repeat the test at appropriate  intervals.  . Color, Urine 09/18/2013 YELLOW  YELLOW Final  . APPearance 09/18/2013 CLEAR  CLEAR Final  . Specific Gravity, Urine 09/18/2013 1.010  1.005 - 1.030 Final  . pH 09/18/2013 5.5  5.0 - 8.0 Final  . Glucose, UA 09/18/2013 NEGATIVE  NEGATIVE mg/dL Final  . Hgb urine dipstick 09/18/2013 NEGATIVE  NEGATIVE Final  . Bilirubin Urine 09/18/2013 NEGATIVE  NEGATIVE Final  . Ketones, ur 09/18/2013 NEGATIVE  NEGATIVE mg/dL Final  . Protein, ur 09/18/2013 NEGATIVE  NEGATIVE mg/dL Final  . Urobilinogen, UA 09/18/2013 0.2  0.0 - 1.0 mg/dL Final  . Nitrite 09/18/2013 NEGATIVE  NEGATIVE Final  . Leukocytes, UA 09/18/2013 NEGATIVE  NEGATIVE Final   MICROSCOPIC NOT DONE ON URINES WITH NEGATIVE PROTEIN, BLOOD, LEUKOCYTES, NITRITE, OR GLUCOSE <1000 mg/dL.  Marland Kitchen Prothrombin Time 09/18/2013 21.5* 11.6 - 15.2 seconds Final  . INR 09/18/2013 1.93* 0.00 - 1.49 Final  . Sodium 09/19/2013 140  137 - 147 mEq/L Final  . Potassium 09/19/2013 4.9  3.7 - 5.3 mEq/L Final  . Chloride 09/19/2013 103  96 - 112 mEq/L Final  . CO2 09/19/2013 22  19 - 32 mEq/L Final  . Glucose, Bld 09/19/2013 135* 70 - 99 mg/dL Final  . BUN 09/19/2013 12  6 - 23 mg/dL Final  . Creatinine, Ser 09/19/2013 1.03  0.50 - 1.10 mg/dL Final  . Calcium 09/19/2013 8.8  8.4 - 10.5 mg/dL Final  . GFR calc non Af Amer 09/19/2013 47* >90 mL/min Final  . GFR calc Af Amer 09/19/2013 55* >90 mL/min Final   Comment: (NOTE)                          The eGFR has been calculated using the CKD EPI equation.                          This calculation has not been validated in all clinical situations.                          eGFR's persistently <90 mL/min signify possible Chronic Kidney                          Disease.  . WBC 09/19/2013 6.3  4.0 - 10.5 K/uL Final  . RBC 09/19/2013 5.33* 3.87 - 5.11 MIL/uL Final  . Hemoglobin 09/19/2013 14.2  12.0 - 15.0 g/dL Final  . HCT 09/19/2013  42.4  36.0 - 46.0 % Final  . MCV 09/19/2013 79.5  78.0 - 100.0 fL  Final  . MCH 09/19/2013 26.6  26.0 - 34.0 pg Final  . MCHC 09/19/2013 33.5  30.0 - 36.0 g/dL Final  . RDW 09/19/2013 16.1* 11.5 - 15.5 % Final  . Platelets 09/19/2013 196  150 - 400 K/uL Final  . Influenza A By PCR 09/19/2013 NEGATIVE  NEGATIVE Final  . Influenza B By PCR 09/19/2013 NEGATIVE  NEGATIVE Final  . H1N1 flu by pcr 09/19/2013 NOT DETECTED  NOT DETECTED Final   Comment:                                 The Xpert Flu assay (FDA approved for                          nasal aspirates or washes and                          nasopharyngeal swab specimens), is                          intended as an aid in the diagnosis of                          influenza and should not be used as                          a sole basis for treatment.  . Prothrombin Time 09/19/2013 21.0* 11.6 - 15.2 seconds Final  . INR 09/19/2013 1.87* 0.00 - 1.49 Final  . Prothrombin Time 09/20/2013 24.7* 11.6 - 15.2 seconds Final  . INR 09/20/2013 2.32* 0.00 - 1.49 Final  . Glucose-Capillary 09/20/2013 136* 70 - 99 mg/dL Final  Office Visit on 08/29/2013  Component Date Value Ref Range Status  . INR 08/29/2013 2.9   Final  Office Visit on 08/01/2013  Component Date Value Ref Range Status  . INR 08/01/2013 2.6   Final      Assessment/Plan  1. GI bleed Is resolved  2. Chronic anticoagulation Continue current dose of warfarin  3. Alzheimer's disease Unchanged  4. Bronchiolitis Improved  5. HYPERTENSION Controlled  6. Leg pain Improved  7. Leg swelling Improved  8. Venous ulcer of right lower extremity with varicose veins Improved  9. Weakness generalized Improved  10. Pain of right lower leg Improved  11. Constipation - polyethylene glycol powder (GLYCOLAX) powder; 17 gram in 4-8 oz fluid daily. Adjust as needed to achieve 1-2 soft stools daily.  Dispense: 255 g; Refill: 5

## 2013-10-09 IMAGING — CR DG TIBIA/FIBULA 2V*R*
4 series · 4 of 4 positions shown · non-contrast
Comparison: None.

CLINICAL DATA: Leg pain

RIGHT TIBIA AND FIBULA - 2 VIEW

[x tib-fib ap right (1 of 2)]
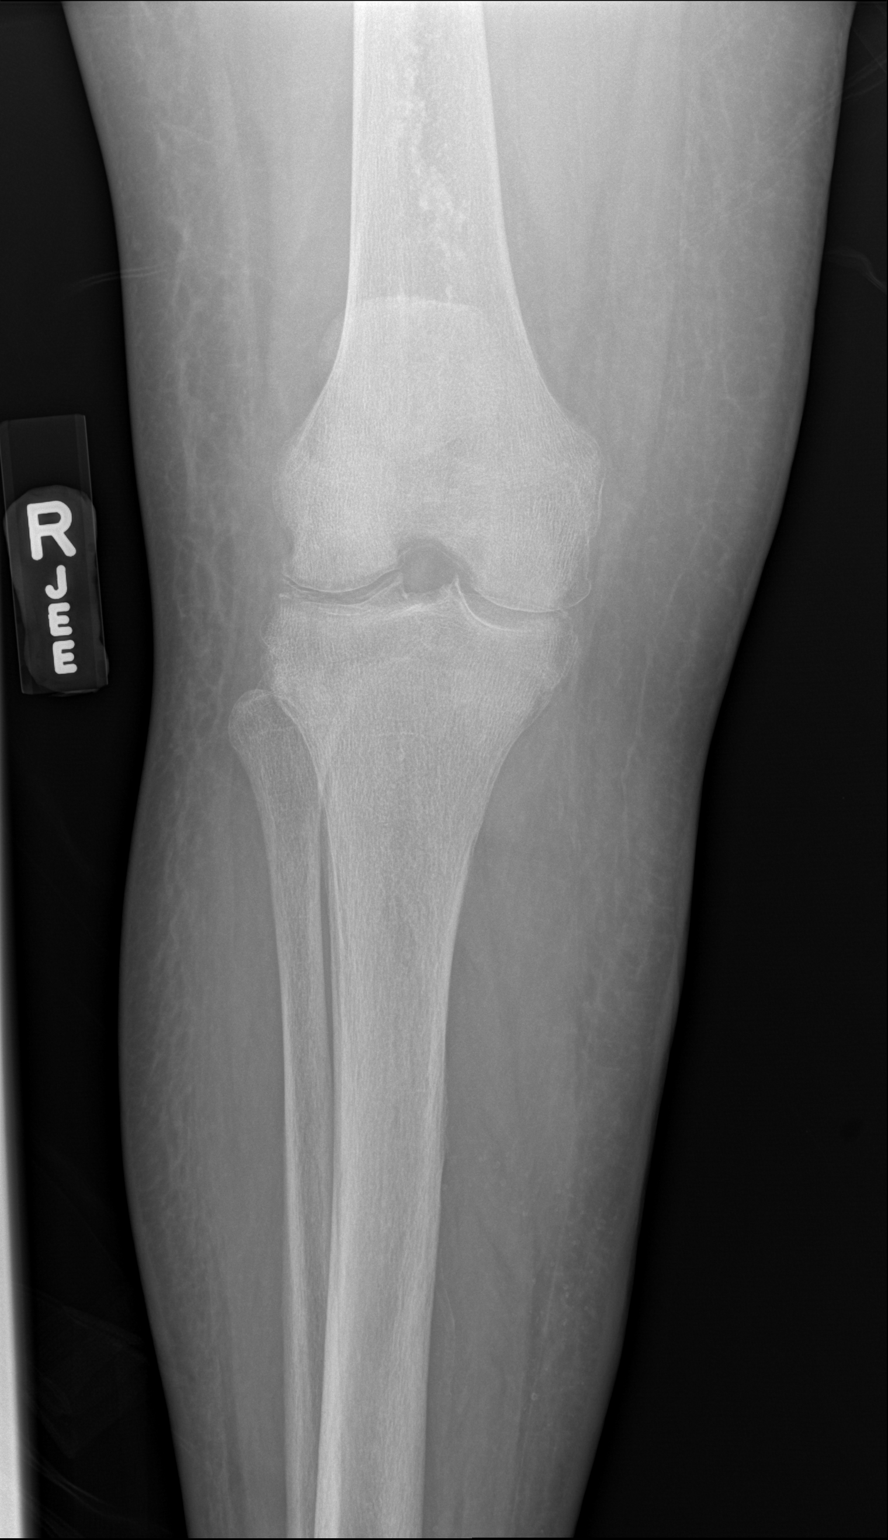

[x tib-fib ap right (2 of 2)]
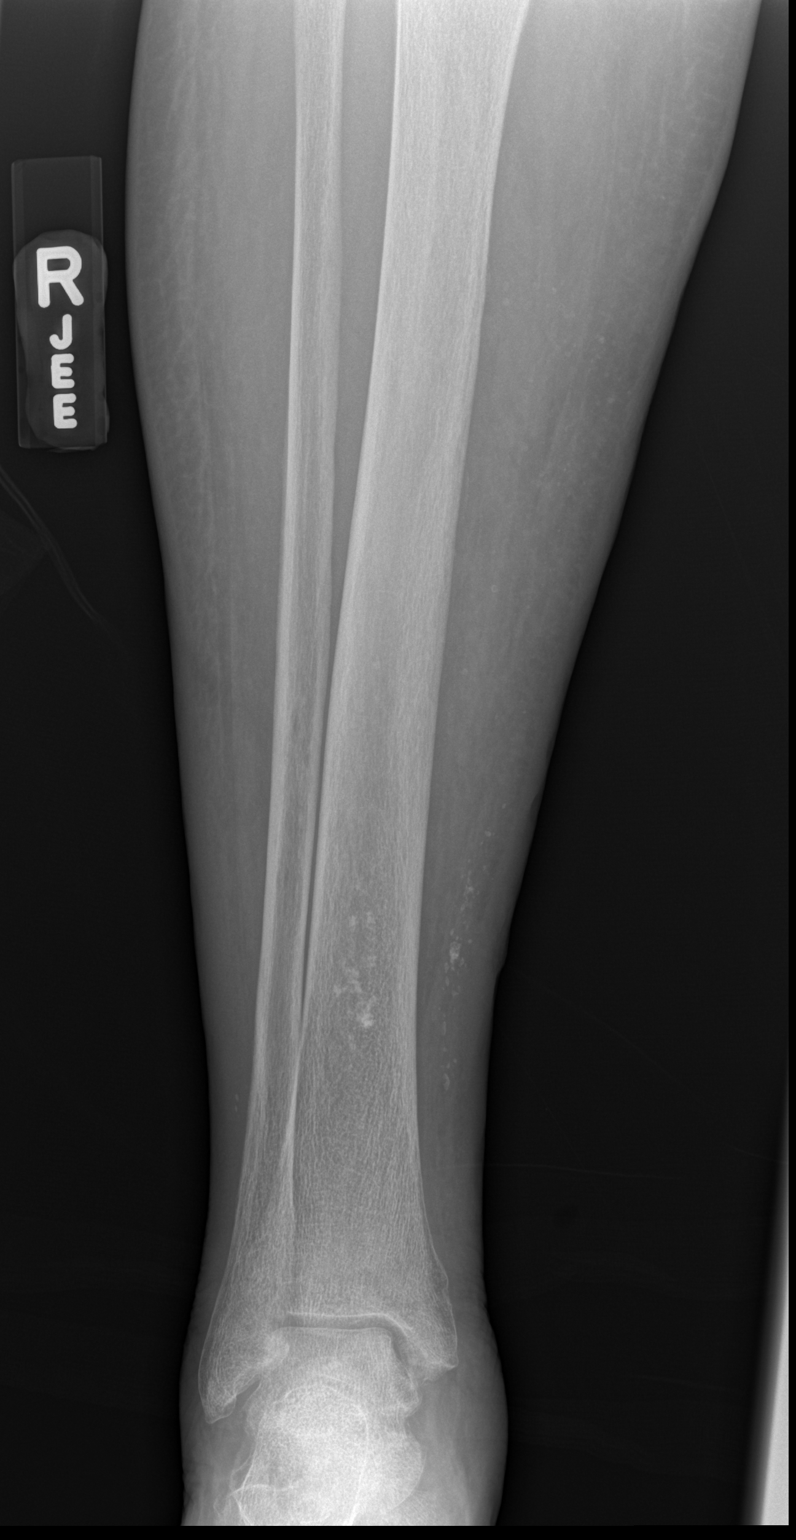

[x tib-fib lat right (1 of 2)]
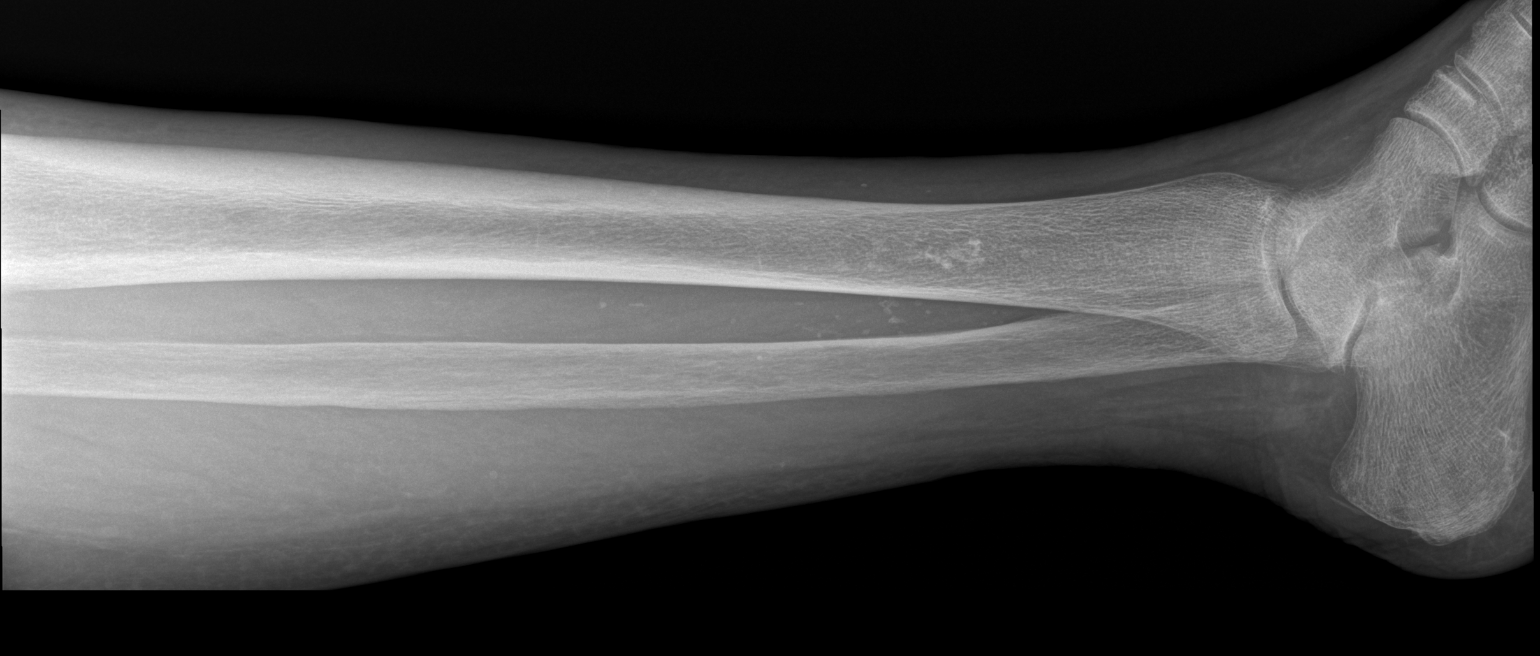

[x tib-fib lat right (2 of 2)]
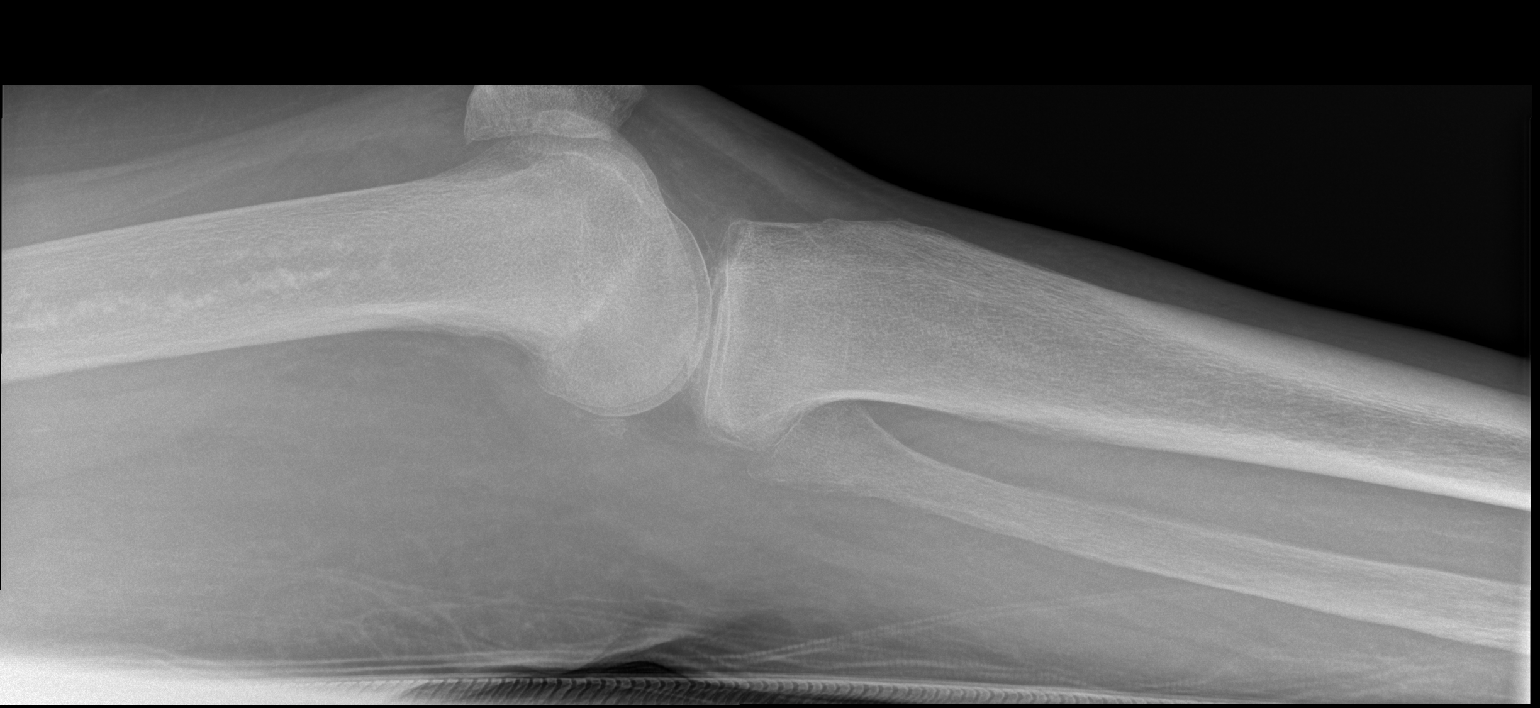

[4 of 4 positions shown; findings below may reference images not displayed]

FINDINGS: Four views of the right tibia-fibula submitted.
Chondrocalcinosis noted right knee joint.  Sclerotic changes distal
right femur probable due to prior avascular necrosis.

No acute fracture or subluxation.  No periosteal reaction or bony
erosion.
IMPRESSION: No acute fracture or subluxation.  No periosteal reaction or bony
erosion.  Chondrocalcinosis noted right knee joint.

## 2013-10-09 IMAGING — CR DG CHEST 2V
2 series · 2 of 2 positions shown · non-contrast
Comparison: 01/22/2011

CLINICAL DATA: Cough, wheezing

CHEST - 2 VIEW

[w chest lat]
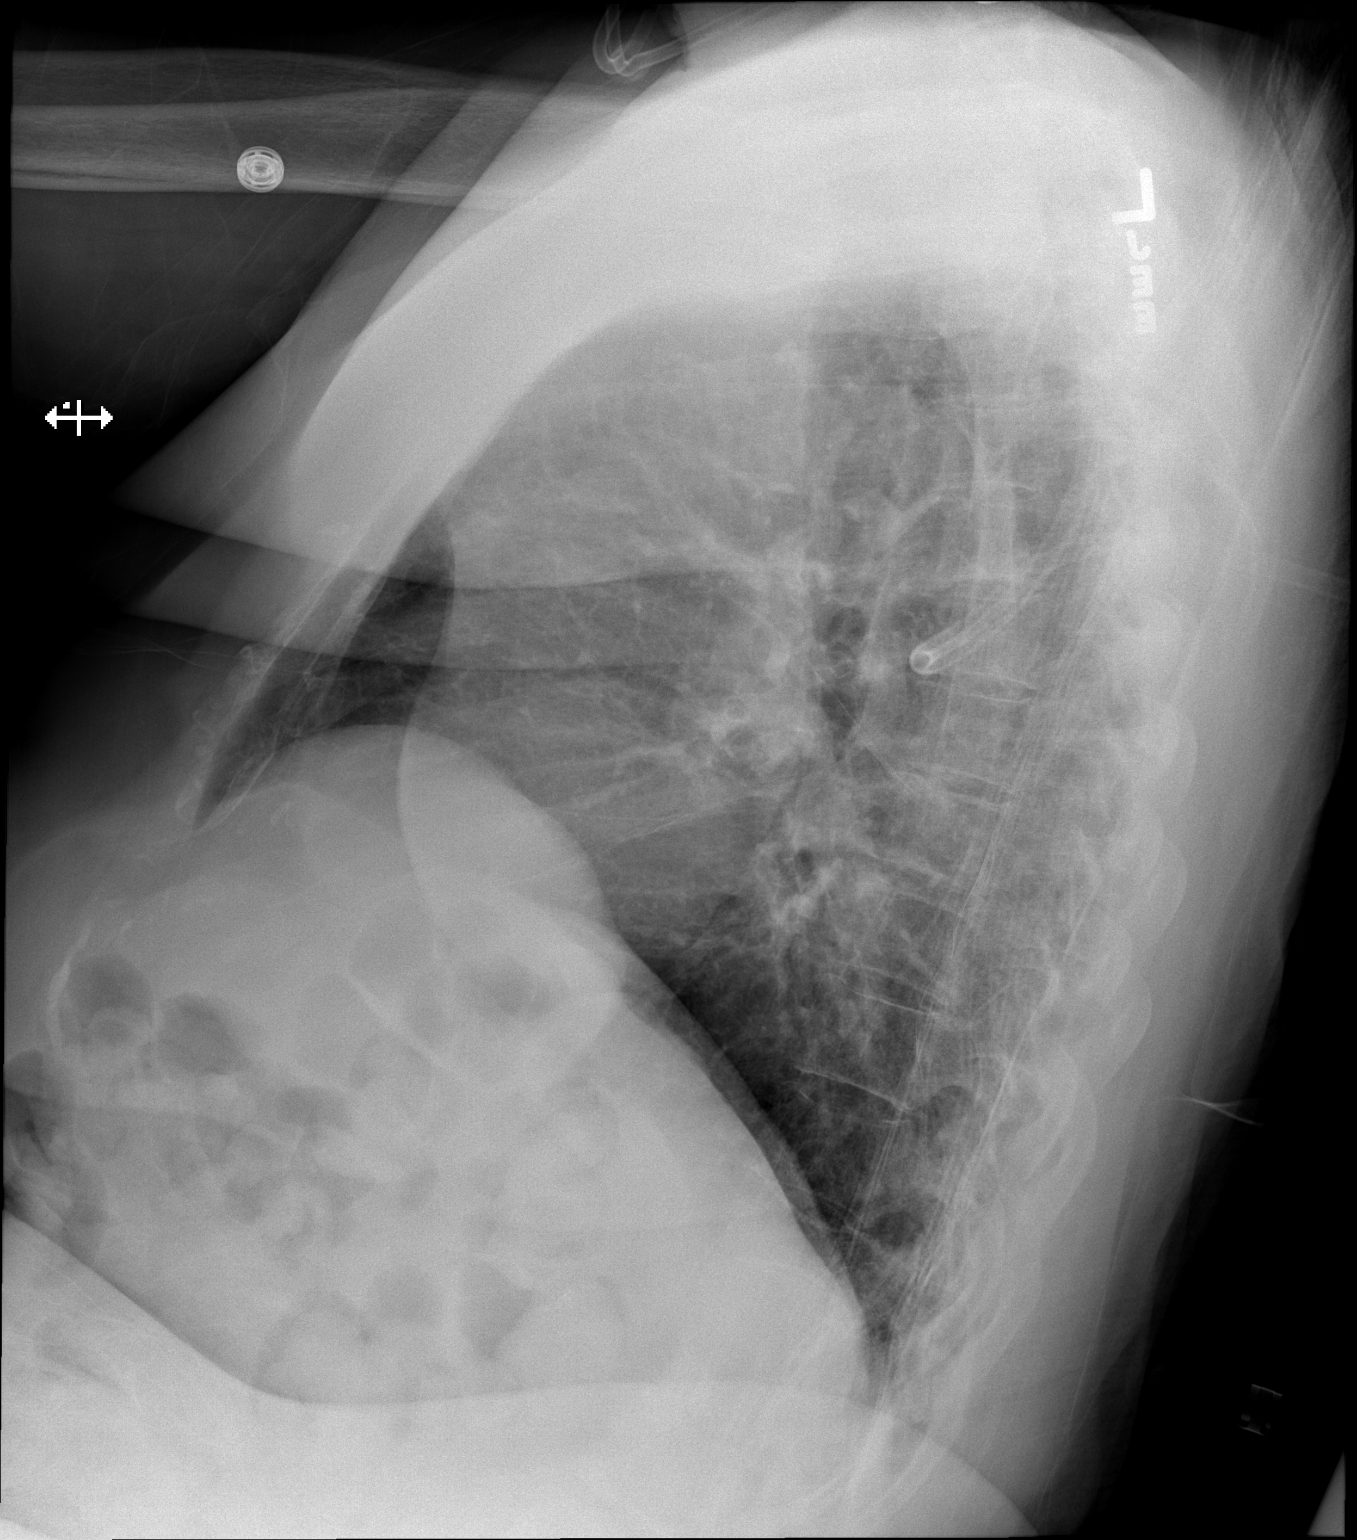

[x chest ap]
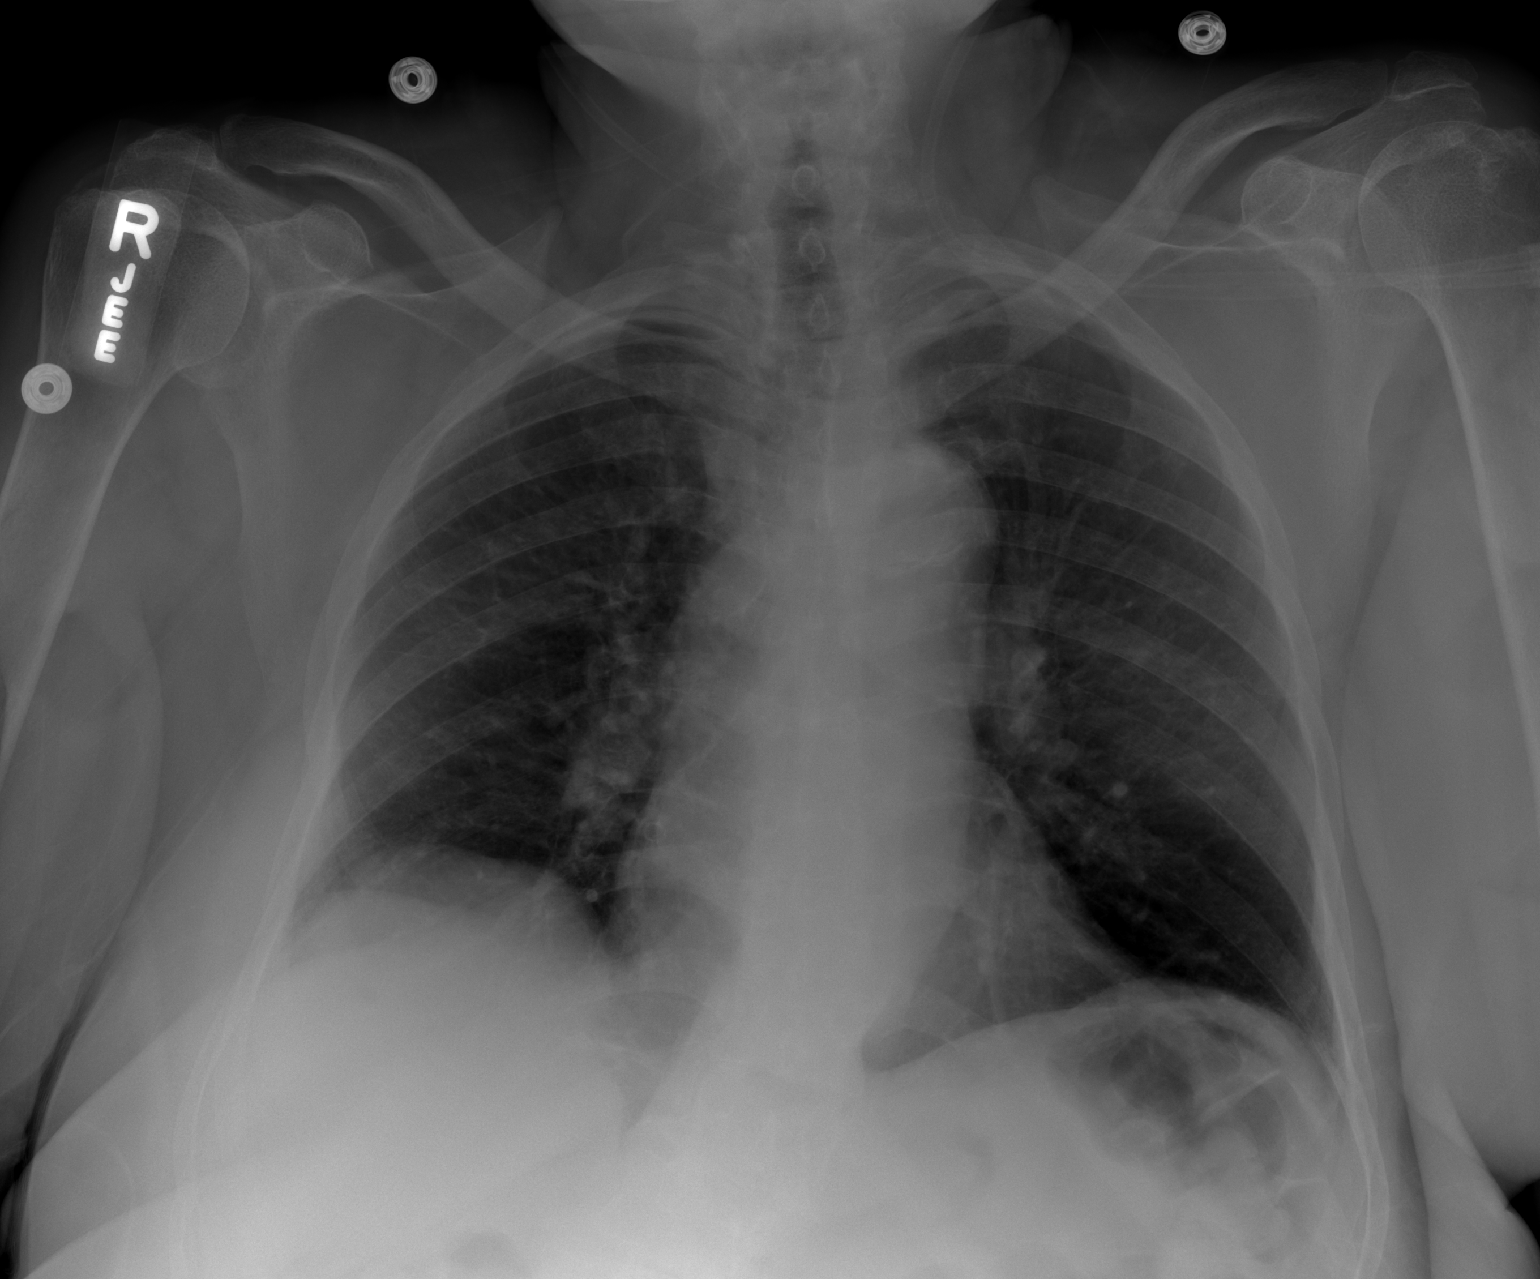

[2 of 2 positions shown; findings below may reference images not displayed]

FINDINGS: Cardiomediastinal silhouette is stable.  Elevation of the
right hemidiaphragm again noted.  No acute infiltrate or pleural
effusion.  No pulmonary edema.  Bony thorax is stable.
IMPRESSION: No active disease.  No significant change.

## 2013-10-10 ENCOUNTER — Other Ambulatory Visit: Payer: Self-pay | Admitting: *Deleted

## 2013-10-10 ENCOUNTER — Telehealth: Payer: Self-pay | Admitting: *Deleted

## 2013-10-10 DIAGNOSIS — K5900 Constipation, unspecified: Secondary | ICD-10-CM

## 2013-10-10 MED ORDER — POLYETHYLENE GLYCOL 3350 17 GM/SCOOP PO POWD: ORAL | Status: DC

## 2013-10-10 NOTE — Telephone Encounter (Signed)
Per Patient request--Faxed to Pharmacy

## 2013-10-10 NOTE — Telephone Encounter (Signed)
Daughter, Blanch Media, called and stated that her mother is hard to get up in the mornings and just wanted [facility] to check her mother's INR on Monday instead of having to get her up to come here. Told her that this was fine. I called [phone]# [phone] and gave verbal order to draw INR on Monday and call us with results.

## 2013-10-12 ENCOUNTER — Telehealth: Payer: Self-pay | Admitting: *Deleted

## 2013-10-12 NOTE — Telephone Encounter (Signed)
BP is too high. Last BP med, Nifedical is associated with constipation, so I think she needs to be on a different medication. I suggest Losartan 50 mg qd (disp: 30 with 5 refills)

## 2013-10-12 NOTE — Telephone Encounter (Signed)
Patient daughter, Darcus Pester and stated that Dr. Nyoka Cowden took her mother off the BP medication. She took BP 10/11/13 am  117/60,  At 11pm  199/76 and then this morning 4/22 it was 170/66. Daughter would like to know if this is ok? Please Advise.

## 2013-10-13 ENCOUNTER — Other Ambulatory Visit: Payer: Self-pay | Admitting: *Deleted

## 2013-10-13 MED ORDER — LOSARTAN POTASSIUM 50 MG PO TABS: ORAL_TABLET | ORAL | Status: DC

## 2013-10-13 NOTE — Telephone Encounter (Signed)
Blanch Media notified and faxed Rx into pharmacy

## 2013-10-17 ENCOUNTER — Ambulatory Visit: Payer: Medicare Other | Admitting: Pharmacotherapy

## 2013-10-17 ENCOUNTER — Telehealth: Payer: Self-pay | Admitting: Pharmacotherapy

## 2013-10-17 DIAGNOSIS — Z7901 Long term (current) use of anticoagulants: Secondary | ICD-10-CM

## 2013-10-17 MED ORDER — WARFARIN SODIUM 5 MG PO TABS: ORAL_TABLET | ORAL | Status: DC

## 2013-10-17 NOTE — Telephone Encounter (Signed)
INR 1.4 Advised patient's daughter to increase Coumadin 2.5mg  QD except 5mg  M/F Repeat INR in 2 weeks.

## 2013-10-24 ENCOUNTER — Other Ambulatory Visit: Payer: Self-pay | Admitting: Internal Medicine

## 2013-10-31 ENCOUNTER — Encounter: Payer: Self-pay | Admitting: Pharmacotherapy

## 2013-10-31 ENCOUNTER — Other Ambulatory Visit: Payer: Self-pay | Admitting: Internal Medicine

## 2013-10-31 ENCOUNTER — Ambulatory Visit (INDEPENDENT_AMBULATORY_CARE_PROVIDER_SITE_OTHER): Payer: Medicare Other | Admitting: Pharmacotherapy

## 2013-10-31 VITALS — BP 118/74 | HR 57 | Temp 97.7°F

## 2013-10-31 DIAGNOSIS — Z7901 Long term (current) use of anticoagulants: Secondary | ICD-10-CM

## 2013-10-31 DIAGNOSIS — E039 Hypothyroidism, unspecified: Secondary | ICD-10-CM

## 2013-10-31 DIAGNOSIS — I749 Embolism and thrombosis of unspecified artery: Secondary | ICD-10-CM

## 2013-10-31 LAB — POCT INR: INR: 2.6

## 2013-10-31 MED ORDER — LEVOTHYROXINE SODIUM 75 MCG PO TABS: 75.0000 ug | ORAL_TABLET | Freq: Every day | ORAL | Status: DC

## 2013-10-31 NOTE — Patient Instructions (Signed)
INR 2.6 Continue Coumadin 2.5mg  (1/2 tablet) daily except 5mg  (1 tablet) on Mondays and Fridays

## 2013-10-31 NOTE — Progress Notes (Signed)
Subjective:     Patient ID: Jennifer Brennan, female   DOB: 03/17/1924, 80 y.o.   MRN: [id number]  HPI Last INR was low at 1.6. Coumadin was increased to 2.5mg  QD except 5mg  M/F Denies missed doses. Denies unusual bruising. Did have some bright red blood in stool after eating nuts. Denies CP, falls Wearing oxygen. Consistent with vitamin K intake.  Review of Systems  HENT: Negative for nosebleeds.   Respiratory: Positive for shortness of breath.   Gastrointestinal: Positive for blood in stool.  Genitourinary: Negative for hematuria.       Objective:   Physical Exam  Constitutional: She is oriented to person, place, and time. She appears well-developed and well-nourished.  HENT:  Right Ear: External ear normal.  Left Ear: External ear normal.  Eyes:  blind  Cardiovascular: Normal rate, regular rhythm and normal heart sounds.   Pulmonary/Chest: Effort normal and breath sounds normal.  Neurological: She is alert and oriented to person, place, and time.  Skin: Skin is warm and dry.  Psychiatric: She has a normal mood and affect.    BP:  118/74   HR:  57      Assessment:     INR 2.6     Plan:     1.  INR at goal 2-3 2.  Continue Coumadin 2.5mg  QD except 5mg  M/F 3.  RTC in 1 month

## 2013-11-01 ENCOUNTER — Other Ambulatory Visit: Payer: Self-pay | Admitting: *Deleted

## 2013-11-01 DIAGNOSIS — M545 Low back pain, unspecified: Secondary | ICD-10-CM

## 2013-11-01 MED ORDER — FENTANYL 12 MCG/HR TD PT72: 12.5000 ug | MEDICATED_PATCH | TRANSDERMAL | Status: DC

## 2013-11-07 ENCOUNTER — Telehealth: Payer: Self-pay

## 2013-11-07 NOTE — Telephone Encounter (Signed)
Message left on triage VM, patient with blister that onset 11/04/13. Blister has now increased in size, red and itchy. Patient's daughter would like a return call.  I called patient's daughter back and informed her patient will need an appointment to be evaluated. Scheduled appointment for tomorrow with Dr.Green.

## 2013-11-08 ENCOUNTER — Encounter: Payer: Self-pay | Admitting: Internal Medicine

## 2013-11-08 ENCOUNTER — Ambulatory Visit (INDEPENDENT_AMBULATORY_CARE_PROVIDER_SITE_OTHER): Payer: Medicare Other | Admitting: Internal Medicine

## 2013-11-08 VITALS — BP 146/84 | HR 55 | Temp 97.7°F

## 2013-11-08 DIAGNOSIS — I739 Peripheral vascular disease, unspecified: Secondary | ICD-10-CM

## 2013-11-08 DIAGNOSIS — M545 Low back pain, unspecified: Secondary | ICD-10-CM

## 2013-11-08 DIAGNOSIS — L739 Follicular disorder, unspecified: Secondary | ICD-10-CM | POA: Insufficient documentation

## 2013-11-08 DIAGNOSIS — J219 Acute bronchiolitis, unspecified: Secondary | ICD-10-CM

## 2013-11-08 DIAGNOSIS — H543 Unqualified visual loss, both eyes: Secondary | ICD-10-CM

## 2013-11-08 DIAGNOSIS — M79609 Pain in unspecified limb: Secondary | ICD-10-CM

## 2013-11-08 DIAGNOSIS — K922 Gastrointestinal hemorrhage, unspecified: Secondary | ICD-10-CM

## 2013-11-08 DIAGNOSIS — C50919 Malignant neoplasm of unspecified site of unspecified female breast: Secondary | ICD-10-CM

## 2013-11-08 DIAGNOSIS — J218 Acute bronchiolitis due to other specified organisms: Secondary | ICD-10-CM

## 2013-11-08 DIAGNOSIS — M79661 Pain in right lower leg: Secondary | ICD-10-CM

## 2013-11-08 DIAGNOSIS — G319 Degenerative disease of nervous system, unspecified: Secondary | ICD-10-CM

## 2013-11-08 DIAGNOSIS — R569 Unspecified convulsions: Secondary | ICD-10-CM

## 2013-11-08 DIAGNOSIS — H409 Unspecified glaucoma: Secondary | ICD-10-CM

## 2013-11-08 DIAGNOSIS — I639 Cerebral infarction, unspecified: Secondary | ICD-10-CM

## 2013-11-08 DIAGNOSIS — I679 Cerebrovascular disease, unspecified: Secondary | ICD-10-CM

## 2013-11-08 DIAGNOSIS — K219 Gastro-esophageal reflux disease without esophagitis: Secondary | ICD-10-CM

## 2013-11-08 DIAGNOSIS — L678 Other hair color and hair shaft abnormalities: Secondary | ICD-10-CM

## 2013-11-08 DIAGNOSIS — R531 Weakness: Secondary | ICD-10-CM

## 2013-11-08 DIAGNOSIS — R5381 Other malaise: Secondary | ICD-10-CM

## 2013-11-08 DIAGNOSIS — F341 Dysthymic disorder: Secondary | ICD-10-CM

## 2013-11-08 DIAGNOSIS — F0280 Dementia in other diseases classified elsewhere without behavioral disturbance: Secondary | ICD-10-CM

## 2013-11-08 DIAGNOSIS — E039 Hypothyroidism, unspecified: Secondary | ICD-10-CM

## 2013-11-08 DIAGNOSIS — R32 Unspecified urinary incontinence: Secondary | ICD-10-CM

## 2013-11-08 DIAGNOSIS — L659 Nonscarring hair loss, unspecified: Secondary | ICD-10-CM

## 2013-11-08 DIAGNOSIS — L738 Other specified follicular disorders: Secondary | ICD-10-CM

## 2013-11-08 DIAGNOSIS — I10 Essential (primary) hypertension: Secondary | ICD-10-CM

## 2013-11-08 DIAGNOSIS — I872 Venous insufficiency (chronic) (peripheral): Secondary | ICD-10-CM

## 2013-11-08 DIAGNOSIS — G309 Alzheimer's disease, unspecified: Secondary | ICD-10-CM

## 2013-11-08 DIAGNOSIS — R1030 Lower abdominal pain, unspecified: Secondary | ICD-10-CM

## 2013-11-08 DIAGNOSIS — M79606 Pain in leg, unspecified: Secondary | ICD-10-CM

## 2013-11-08 DIAGNOSIS — R5383 Other fatigue: Secondary | ICD-10-CM

## 2013-11-08 DIAGNOSIS — I6350 Cerebral infarction due to unspecified occlusion or stenosis of unspecified cerebral artery: Secondary | ICD-10-CM

## 2013-11-08 DIAGNOSIS — Z7901 Long term (current) use of anticoagulants: Secondary | ICD-10-CM | POA: Insufficient documentation

## 2013-11-08 DIAGNOSIS — R109 Unspecified abdominal pain: Secondary | ICD-10-CM

## 2013-11-08 MED ORDER — FLUTICASONE-SALMETEROL 250-50 MCG/DOSE IN AEPB: INHALATION_SPRAY | RESPIRATORY_TRACT | Status: AC

## 2013-11-08 NOTE — Progress Notes (Signed)
Patient ID: Jennifer Brennan, female   DOB: 1923/08/10, 80 y.o.   MRN: [id number]    Location:    [facility]  Place of Service:  OFFICE  PCP: Estill Dooms, MD  Code Status: Full. No Advanced care documents. Patient expresses interest in Living Will. Says she does not believe in Full code status if death is otherwise imminent.  Extended Emergency Contact Information Primary Emergency Contact: [name] [place] of [place] Phone: [phone] Mobile Phone: [phone] Relation: Daughter Secondary Emergency Contact: [facility] Address: [address] [place], [place] [address] Johnnette Litter of [place] Phone: [phone] Relation: Spouse  Allergies  Allergen Reactions  . Bactrim [Sulfamethoxazole-Trimethoprim] Other (See Comments)    MD stopped due to kidney failure Also causes lips to swell  . Doxycycline Swelling    Causes lips to swell, wheezing  . Furosemide Other (See Comments)    MD stopped due to kidney failure  . Percocet [Oxycodone-Acetaminophen] Other (See Comments)    Causes SEIZURES  . Valsartan Other (See Comments)    MD stopped due to kidney failure  . Penicillins Hives  . Morphine Sulfate Itching and Rash  . Sertraline Hcl Rash    Chief Complaint  Patient presents with  . Acute Visit    blister on the upper LT leg, red/iitchy x 5 days.  has used Cortizone 10 &  Neosporin with some relief  . other    has also been real sleepy and slept almost all day yesterday    HPI:  Folliculitis: healing in the left leg, anterior upper portion  Alzheimer's disease; unchanged in last several months, but worse over the last year according to her daughter.  ANXIETY DEPRESSION: episodes of anxiety. Also, daughter says she gets depresed, but it seems to come and go and is not daily  BACK PAIN, LUMBAR, CHRONIC: persistent. Unchanged.  Chronic venous insufficiency: unchanged. Prior stasis dermatitis is healed.  GERD  (gastroesophageal reflux disease): occ. heartburn  GI bleed: hospitalized in April 2014. No reoccurrence. She remains on warfarin.  GLAUCOMA: on several eye drops. Contributes to near blindness.  HYPERTENSION - Plan: Comprehensive metabolic panel  HYPOTHYROIDISM - compensated  SEIZURE DISORDER: none since at least 2013  Weakness generalized -unchanged. Variable from day to day  Peripheral arterial disease: calcifications in abd and leg arteries. Diminished pedal pulses.  Leg pain: intermittent and of variable intensity in both legs. Improved over the last year  Incontinence of urine: wearing incontinence diapers  Long term (current) use of anticoagulants - for hx DVT and CVA  ADENOCARCINOMA, LEFT BREAST: post resection in 2011  Hair loss: has slowed down  Pain in the groin: intermittent and of variable intensity  Pain of right lower leg: intermittent and of variable intensity  Bronchiolitis - recurrent episodes of wheezing, coughing, and sputum production  Cerebral atrophy: on CT and MRI brain since 2000  Cerebrovascular disease: on CT and MRI brain since 2000  Cerebral infarct: noted on multiple brain scans  Blindness of both eyes: contributing factors are glaucoma and here bilateral occipital infarcts      Past Medical History  Diagnosis Date  . Hypertension   . Hypothyroidism   . Seizure disorder   . Severe stage glaucoma     legally blind  . Depression with anxiety   . OAB (overactive bladder)   . DVT 05/2007    RLE following R THR - chronic coumadin, chronic RLE pain  .  GLAUCOMA   . OSTEOPENIA   . Retinal ischemia   . Sciatica of right side     chronic RLE pain, multifactorial - MRI T/L spine 02/2011  . VITAMIN D DEFICIENCY dx 08/2008  . Venous ulcer of right lower extremity with varicose veins   . Dementia   . Vulvar abscess   . Unspecified hypothyroidism   . Unspecified vitamin D deficiency   . Unspecified urinary incontinence   . Renal disorder    . Varicose veins of lower extremity   . Varicose veins of lower extremities with ulcer   . Unspecified disorder of kidney and ureter   . Stevens-Johnson syndrome   . Sebaceous cyst   . Disorder of bone and cartilage, unspecified   . Contusion of lower leg   . Depression   . Seizures   . Osteoporosis, unspecified 05/12/2013  . Acute asthmatic bronchitis 09/18/2013  . DVT, lower extremity   . ADENOCARCINOMA, LEFT BREAST 04/11/2010    s/p L mastectomy, on femara x 69yr . OSTEOARTHRITIS, HANDS, BILATERAL   . Arthritis     "back; hands; toes" (09/26/2013)  . Chronic back pain     "upper and lower" (09/26/2013)  . Anxiety   . Blindness of both eyes     "from glaucoma". Also has bilateral occipital lobe infarcts.  . Cerebral atrophy   . Cerebrovascular disease   . Cerebral infarct     left parietal and bilateral occipital lobes    Past Surgical History  Procedure Laterality Date  . Total abdominal hysterectomy    . Total hip arthroplasty Right 10/2006  . Refractive surgery Bilateral   . Tonsillectomy    . Breast biopsy Left 03/2010  . Mastectomy Left     CONSULTANTS Orthopedic-Dr. WAutumn PattyErnestina Patches(spinal injections) Pain management: Dr. KLucia EstelleOncologist-Dr. Magrinat Podiatrist-Dr. SCaffie PintoCardiologist-Dr. CBurt KnackOphthalmologist-Dr. CJanyth ContesNeurologist-Dr. Tat Urologist-Dr. GRisa GrillVein/ Vascular-Dr. CSena Slate PAST PROCEDURES  2013-Apligraf times 2 [facility]-Kidney Ultrasound-Dr. GRisa Grill: normal  02/27/12 MRI brain: old infarcts in parietal and occipital lobes. SVD. Cerebral atrophy.  9/6/13CT angio Head: ICA atherosclerosis  03/02/12 CT head: old infarcts left parietal and both occipital lobes. SVD. Cerebral atrophy.  Social History: History   Social History  . Marital Status: Married    Spouse Name: N/A    Number of Children: N/A  . Years of Education: N/A   Social History Main Topics  .  Smoking status: Former Smoker -- 1.00 packs/day for 45 years    Types: Cigarettes    Quit date: 10/16/1979  . Smokeless tobacco: Never Used  . Alcohol Use: Yes     Comment: 09/26/2013 "drank some years ago"  . Drug Use: No  . Sexual Activity: No   Other Topics Concern  . None   Social History Narrative   Retired    Married        Never Smoked        Alcohol use-no                  Family History Family Status  Relation Status Death Age  . Mother Deceased   . Father Deceased   . Brother Deceased     166 . Daughter Alive   . Daughter Alive   . Daughter Alive   . Daughter Alive    Family History  Problem Relation Age of Onset  . Ovarian cancer Mother   . Cancer Mother   .  Deep vein thrombosis Mother   . Heart disease Mother   . Arthritis Other     grandparents  . Lung cancer Other   . Heart disease Other     parent  . Cancer Father     BRAIN TUMOR  . Cancer Brother   . Hyperlipidemia Brother   . Hypertension Brother   . Stroke Brother   . Arthritis Brother   . Sickle cell anemia Daughter   . Heart disease Daughter   . Sickle cell anemia Daughter      Medications: Patient's Medications  New Prescriptions   No medications on file  Previous Medications   ACETAMINOPHEN (TYLENOL) 500 MG TABLET    Take 1,000 mg by mouth every 6 (six) hours as needed for mild pain, fever or headache. pain   ALBUTEROL (PROVENTIL HFA;VENTOLIN HFA) 108 (90 BASE) MCG/ACT INHALER    Inhale 2 puffs into the lungs every 6 (six) hours as needed. For shortness of breath.   BRIMONIDINE (ALPHAGAN) 0.15 % OPHTHALMIC SOLUTION    Place 1 drop into both eyes 2 (two) times daily.   CHOLECALCIFEROL 2000 UNITS TABS    Take 1 tablet by mouth every morning.   CRANBERRY-VITAMIN C-VITAMIN E 4200-20-3 MG-MG-UNIT CAPS    Take by mouth. Take one tablet once a day   DILTIAZEM 2 % GEL    Apply 1 application topically 2 (two) times daily. Apply to anal fissures. Place a small amount just inside and  outside of anus as needed   DORZOLAMIDE-TIMOLOL (COSOPT) 22.3-6.8 MG/ML OPHTHALMIC SOLUTION    Place 1 drop into both eyes 2 (two) times daily.     FENTANYL (DURAGESIC - DOSED MCG/HR) 12 MCG/HR    Place 1 patch (12.5 mcg total) onto the skin every 3 (three) days.   FLUTICASONE-SALMETEROL (ADVAIR) 250-50 MCG/DOSE AEPB    Inhale 1 puff into the lungs 2 (two) times daily.   GABAPENTIN (NEURONTIN) 300 MG CAPSULE    take 2 capsules by mouth at bedtime for pain   IPRATROPIUM-ALBUTEROL (COMBIVENT RESPIMAT) 20-100 MCG/ACT AERS RESPIMAT    Inhale 2 puffs into the lungs every 6 (six) hours. Take 2 puffs 4 times daily to help with breathing as needed.   LATANOPROST (XALATAN) 0.005 % OPHTHALMIC SOLUTION    Place 1 drop into both eyes at bedtime.     LETROZOLE (FEMARA) 2.5 MG TABLET    Take 2.5 mg by mouth at bedtime.   LEVETIRACETAM (KEPPRA) 500 MG TABLET    Take 500 mg by mouth 2 (two) times daily.   LEVOTHYROXINE (SYNTHROID, LEVOTHROID) 75 MCG TABLET    Take 1 tablet (75 mcg total) by mouth daily before breakfast.   LOSARTAN (COZAAR) 50 MG TABLET    Take one tablet by mouth once daily to control blood pressure   MEMANTINE HCL ER 28 MG CP24    Take 28 mg by mouth daily. One daily to help preserve memory   MULTIPLE VITAMIN (MULTIVITAMIN) TABLET    Centrum Silver:  Take 1 tablet daily   OMEPRAZOLE (PRILOSEC) 20 MG CAPSULE    Take 20 mg by mouth every other day. Take 1 tablet every other day to reduce stomach acids.   PILOCARPINE (PILOCAR) 1 % OPHTHALMIC SOLUTION    Place 1 drop into both eyes 3 (three) times daily.    POLYETHYLENE GLYCOL POWDER (GLYCOLAX) POWDER    17 gram in 4-8 oz fluid daily. Adjust as needed to achieve 1-2 soft stools daily.   PROBIOTIC PRODUCT (ALIGN)  4 MG CAPS    Take by mouth. Take 1 capsule daily to help irritable colon. Only while on antibiotics.   WARFARIN (COUMADIN) 5 MG TABLET    Take 1/2 tablet (2.56m) daily except 1 tablet (542m on Mondays and Fridays  Modified Medications   No  medications on file  Discontinued Medications   MULTIPLE VITAMINS-MINERALS (MULTIVITAMIN GUMMIES ADULT) CHEW    Chew by mouth. Centrum Silver, Adults    Immunization History  Administered Date(s) Administered  . Tdap 06/23/2010     Review of Systems  Constitutional: Positive for fatigue. Negative for fever, diaphoresis, activity change, appetite change and unexpected weight change.  HENT: Positive for hearing loss. Negative for congestion and ear pain.   Eyes: Positive for visual disturbance.       Nearly blind bilaterally.  Respiratory: Negative for apnea, cough, choking, chest tightness, shortness of breath and wheezing.   Cardiovascular: Positive for leg swelling. Negative for chest pain and palpitations.  Gastrointestinal: Negative for abdominal pain and abdominal distention.  Endocrine: Negative for cold intolerance, polydipsia, polyphagia and polyuria.  Genitourinary: Positive for frequency. Negative for decreased urine volume, difficulty urinating and pelvic pain.       Urinary incontinence.  Musculoskeletal: Positive for arthralgias, gait problem and myalgias. Negative for joint swelling, neck pain and neck stiffness.       History of back and leg pains.  Skin:       Single red lesion on the anterior left upper leg.  Allergic/Immunologic: Negative.   Neurological: Positive for weakness. Negative for dizziness, tremors, seizures, syncope, facial asymmetry, speech difficulty, light-headedness, numbness and headaches.  Hematological: Negative.   Psychiatric/Behavioral: Positive for confusion, sleep disturbance, decreased concentration and agitation. Negative for suicidal ideas, hallucinations and behavioral problems. The patient is not nervous/anxious and is not hyperactive.       Filed Vitals:   11/08/13 1608  BP: 146/84  Pulse: 55  Temp: 97.7 F (36.5 C)  TempSrc: Oral  SpO2: 95%   There is no weight on file to calculate BMI.  Physical Exam  Constitutional: No  distress.  HENT:  Head: Normocephalic and atraumatic.  Diminished hearing bilaterally. Exposure of root of the lower left canine.  Eyes: Conjunctivae and EOM are normal. Left eye exhibits no discharge.  Nearly blindbilaterally  Neck: Normal range of motion. Neck supple. No JVD present. No tracheal deviation present. No thyromegaly present.  Cardiovascular: Normal rate, regular rhythm and normal heart sounds.  Exam reveals no gallop and no friction rub.   No murmur heard. Pulmonary/Chest: Effort normal and breath sounds normal. No respiratory distress. She has no wheezes. She has no rales.  Abdominal: Bowel sounds are normal. She exhibits no distension and no mass. There is no tenderness.  Musculoskeletal: Normal range of motion. She exhibits edema and tenderness.  Tender at both knees. Tender in the right groin area.  Lymphadenopathy:    She has no cervical adenopathy.  Neurological: She is alert. No cranial nerve deficit. Coordination normal.  Memory deficit.  Skin: Skin is warm and dry. No rash noted. She is not diaphoretic. No erythema. No pallor.  2 mm red spot on the anterior left thigh that appears to have been a small blister of folliculitis. It is healing. There is mild discoloration of the skin around this lesion that was likely an inflammatory response that is now subsiding.  Psychiatric:  Significant dementia. Calm at our office today.. Improvement episodes of agitation at home.  Labs reviewed: Office Visit on 10/31/2013  Component Date Value Ref Range Status  . INR 10/31/2013 2.6   Final  Office Visit on 10/03/2013  Component Date Value Ref Range Status  . INR 10/03/2013 1.6   Final  Admission on 09/25/2013, Discharged on 09/26/2013  Component Date Value Ref Range Status  . Fecal Occult Bld 09/25/2013 POSITIVE* NEGATIVE Final  . WBC 09/25/2013 8.4  4.0 - 10.5 K/uL Final  . RBC 09/25/2013 5.29* 3.87 - 5.11 MIL/uL Final  . Hemoglobin 09/25/2013 14.3  12.0 -  15.0 g/dL Final  . HCT 09/25/2013 41.9  36.0 - 46.0 % Final  . MCV 09/25/2013 79.2  78.0 - 100.0 fL Final  . MCH 09/25/2013 27.0  26.0 - 34.0 pg Final  . MCHC 09/25/2013 34.1  30.0 - 36.0 g/dL Final  . RDW 09/25/2013 15.7* 11.5 - 15.5 % Final  . Platelets 09/25/2013 214  150 - 400 K/uL Final  . Neutrophils Relative % 09/25/2013 51  43 - 77 % Final  . Neutro Abs 09/25/2013 4.3  1.7 - 7.7 K/uL Final  . Lymphocytes Relative 09/25/2013 37  12 - 46 % Final  . Lymphs Abs 09/25/2013 3.1  0.7 - 4.0 K/uL Final  . Monocytes Relative 09/25/2013 8  3 - 12 % Final  . Monocytes Absolute 09/25/2013 0.7  0.1 - 1.0 K/uL Final  . Eosinophils Relative 09/25/2013 3  0 - 5 % Final  . Eosinophils Absolute 09/25/2013 0.2  0.0 - 0.7 K/uL Final  . Basophils Relative 09/25/2013 1  0 - 1 % Final  . Basophils Absolute 09/25/2013 0.0  0.0 - 0.1 K/uL Final  . Sodium 09/25/2013 141  137 - 147 mEq/L Final  . Potassium 09/25/2013 3.8  3.7 - 5.3 mEq/L Final  . Chloride 09/25/2013 97  96 - 112 mEq/L Final  . CO2 09/25/2013 30  19 - 32 mEq/L Final  . Glucose, Bld 09/25/2013 120* 70 - 99 mg/dL Final  . BUN 09/25/2013 18  6 - 23 mg/dL Final  . Creatinine, Ser 09/25/2013 1.10  0.50 - 1.10 mg/dL Final  . Calcium 09/25/2013 9.4  8.4 - 10.5 mg/dL Final  . Total Protein 09/25/2013 7.0  6.0 - 8.3 g/dL Final  . Albumin 09/25/2013 3.1* 3.5 - 5.2 g/dL Final  . AST 09/25/2013 21  0 - 37 U/L Final  . ALT 09/25/2013 16  0 - 35 U/L Final  . Alkaline Phosphatase 09/25/2013 70  39 - 117 U/L Final  . Total Bilirubin 09/25/2013 0.2* 0.3 - 1.2 mg/dL Final  . GFR calc non Af Amer 09/25/2013 44* >90 mL/min Final  . GFR calc Af Amer 09/25/2013 50* >90 mL/min Final   Comment: (NOTE)                          The eGFR has been calculated using the CKD EPI equation.                          This calculation has not been validated in all clinical situations.                          eGFR's persistently <90 mL/min signify possible Chronic  Kidney                          Disease.  Marland Kitchen Prothrombin Time  09/25/2013 33.3* 11.6 - 15.2 seconds Final  . INR 09/25/2013 3.43* 0.00 - 1.49 Final  . Troponin I 09/25/2013 <0.30  <0.30 ng/mL Final   Comment:                                 Due to the release kinetics of cTnI,                          a negative result within the first hours                          of the onset of symptoms does not rule out                          myocardial infarction with certainty.                          If myocardial infarction is still suspected,                          repeat the test at appropriate intervals.  . Pro B Natriuretic peptide (BNP) 09/25/2013 187.0  0 - 450 pg/mL Final  . Color, Urine 09/25/2013 YELLOW  YELLOW Final  . APPearance 09/25/2013 CLEAR  CLEAR Final  . Specific Gravity, Urine 09/25/2013 1.010  1.005 - 1.030 Final  . pH 09/25/2013 6.5  5.0 - 8.0 Final  . Glucose, UA 09/25/2013 NEGATIVE  NEGATIVE mg/dL Final  . Hgb urine dipstick 09/25/2013 MODERATE* NEGATIVE Final  . Bilirubin Urine 09/25/2013 NEGATIVE  NEGATIVE Final  . Ketones, ur 09/25/2013 NEGATIVE  NEGATIVE mg/dL Final  . Protein, ur 09/25/2013 NEGATIVE  NEGATIVE mg/dL Final  . Urobilinogen, UA 09/25/2013 0.2  0.0 - 1.0 mg/dL Final  . Nitrite 09/25/2013 NEGATIVE  NEGATIVE Final  . Leukocytes, UA 09/25/2013 NEGATIVE  NEGATIVE Final  . aPTT 09/25/2013 41* 24 - 37 seconds Final   Comment:                                 IF BASELINE aPTT IS ELEVATED,                          SUGGEST PATIENT RISK ASSESSMENT                          BE USED TO DETERMINE APPROPRIATE                          ANTICOAGULANT THERAPY.  . ABO/RH(D) 09/25/2013 O POS   Final  . Antibody Screen 09/25/2013 NEG   Final  . Sample Expiration 09/25/2013 09/28/2013   Final  . Squamous Epithelial / LPF 09/25/2013 RARE  RARE Final  . RBC / HPF 09/25/2013 3-6  <3 RBC/hpf Final  . Bacteria, UA 09/25/2013 RARE  RARE Final  . WBC 09/25/2013 9.6  4.0 -  10.5 K/uL Final  . RBC 09/25/2013 5.26* 3.87 - 5.11 MIL/uL Final  . Hemoglobin 09/25/2013 14.2  12.0 - 15.0 g/dL Final  . HCT 09/25/2013 41.7  36.0 - 46.0 % Final  . MCV 09/25/2013 79.3  78.0 - 100.0 fL Final  . MCH 09/25/2013 27.0  26.0 - 34.0 pg Final  . MCHC 09/25/2013 34.1  30.0 - 36.0 g/dL Final  . RDW 09/25/2013 15.5  11.5 - 15.5 % Final  . Platelets 09/25/2013 197  150 - 400 K/uL Final  . Prothrombin Time 09/25/2013 21.3* 11.6 - 15.2 seconds Final  . INR 09/25/2013 1.91* 0.00 - 1.49 Final  . WBC 09/26/2013 9.1  4.0 - 10.5 K/uL Final  . RBC 09/26/2013 4.82  3.87 - 5.11 MIL/uL Final  . Hemoglobin 09/26/2013 12.8  12.0 - 15.0 g/dL Final  . HCT 09/26/2013 38.4  36.0 - 46.0 % Final  . MCV 09/26/2013 79.7  78.0 - 100.0 fL Final  . MCH 09/26/2013 26.6  26.0 - 34.0 pg Final  . MCHC 09/26/2013 33.3  30.0 - 36.0 g/dL Final  . RDW 09/26/2013 15.7* 11.5 - 15.5 % Final  . Platelets 09/26/2013 217  150 - 400 K/uL Final  . Glucose-Capillary 09/26/2013 107* 70 - 99 mg/dL Final  . Comment 1 09/26/2013 Documented in Chart   Final  . Comment 2 09/26/2013 Notify RN   Final  . Prothrombin Time 09/26/2013 15.5* 11.6 - 15.2 seconds Final  . INR 09/26/2013 1.26  0.00 - 1.49 Final  Office Visit on 09/21/2013  Component Date Value Ref Range Status  . WBC 09/21/2013 9.6  3.4 - 10.8 x10E3/uL Final  . RBC 09/21/2013 5.54* 3.77 - 5.28 x10E6/uL Final  . Hemoglobin 09/21/2013 14.5  11.1 - 15.9 g/dL Final  . HCT 09/21/2013 41.8  34.0 - 46.6 % Final  . MCV 09/21/2013 76* 79 - 97 fL Final  . MCH 09/21/2013 26.2* 26.6 - 33.0 pg Final  . MCHC 09/21/2013 34.7  31.5 - 35.7 g/dL Final  . RDW 09/21/2013 15.8* 12.3 - 15.4 % Final  . Platelets 09/21/2013 197  150 - 379 x10E3/uL Final  . Neutrophils Relative % 09/21/2013 71   Final  . Lymphs 09/21/2013 17   Final  . Monocytes 09/21/2013 8   Final  . Eos 09/21/2013 4   Final  . Basos 09/21/2013 0   Final  . Neutrophils Absolute 09/21/2013 6.9  1.4 - 7.0 x10E3/uL  Final  . Lymphocytes Absolute 09/21/2013 1.6  0.7 - 3.1 x10E3/uL Final  . Monocytes Absolute 09/21/2013 0.7  0.1 - 0.9 x10E3/uL Final  . Eosinophils Absolute 09/21/2013 0.4  0.0 - 0.4 x10E3/uL Final  . Basophils Absolute 09/21/2013 0.0  0.0 - 0.2 x10E3/uL Final  . Immature Granulocytes 09/21/2013 0   Final  . Immature Grans (Abs) 09/21/2013 0.0  0.0 - 0.1 x10E3/uL Final  . Hematology Comments: 09/21/2013 Note:   Final   Verified by microscopic examination.  . Glucose 09/21/2013 100* 65 - 99 mg/dL Final  . BUN 09/21/2013 21  8 - 27 mg/dL Final  . Creatinine, Ser 09/21/2013 1.22* 0.57 - 1.00 mg/dL Final  . GFR calc non Af Amer 09/21/2013 40* >59 mL/min/1.73 Final  . GFR calc Af Amer 09/21/2013 46* >59 mL/min/1.73 Final  . BUN/Creatinine Ratio 09/21/2013 17  11 - 26 Final  . Sodium 09/21/2013 145* 134 - 144 mmol/L Final  . Potassium 09/21/2013 4.0  3.5 - 5.2 mmol/L Final  . Chloride 09/21/2013 99  97 - 108 mmol/L Final  . CO2 09/21/2013 23  18 - 29 mmol/L Final  . Calcium 09/21/2013 9.3  8.7 - 10.3 mg/dL Final  . Total Protein 09/21/2013 7.1  6.0 - 8.5 g/dL Final  .  Albumin 09/21/2013 4.2  3.5 - 4.7 g/dL Final  . Globulin, Total 09/21/2013 2.9  1.5 - 4.5 g/dL Final  . Albumin/Globulin Ratio 09/21/2013 1.4  1.1 - 2.5 Final  . Total Bilirubin 09/21/2013 0.3  0.0 - 1.2 mg/dL Final  . Alkaline Phosphatase 09/21/2013 74  39 - 117 IU/L Final  . AST 09/21/2013 34  0 - 40 IU/L Final  . ALT 09/21/2013 18  0 - 32 IU/L Final  . INR 09/21/2013 3.2* 0.8 - 1.2 Final   Comment: Reference interval is for non-anticoagulated patients.                          Suggested INR therapeutic range for Vitamin K                          antagonist therapy:                             Standard Dose (moderate intensity                                            therapeutic range):       2.0 - 3.0                             Higher intensity therapeutic range       2.5 - 3.5  . Prothrombin Time 09/21/2013 33.9*  9.1 - 12.0 sec Final  Admission on 09/18/2013, Discharged on 09/20/2013  Component Date Value Ref Range Status  . WBC 09/18/2013 5.3  4.0 - 10.5 K/uL Final  . RBC 09/18/2013 5.20* 3.87 - 5.11 MIL/uL Final  . Hemoglobin 09/18/2013 14.2  12.0 - 15.0 g/dL Final  . HCT 09/18/2013 40.9  36.0 - 46.0 % Final  . MCV 09/18/2013 78.7  78.0 - 100.0 fL Final  . MCH 09/18/2013 27.3  26.0 - 34.0 pg Final  . MCHC 09/18/2013 34.7  30.0 - 36.0 g/dL Final  . RDW 09/18/2013 15.7* 11.5 - 15.5 % Final  . Platelets 09/18/2013 190  150 - 400 K/uL Final  . Neutrophils Relative % 09/18/2013 51  43 - 77 % Final  . Neutro Abs 09/18/2013 2.7  1.7 - 7.7 K/uL Final  . Lymphocytes Relative 09/18/2013 35  12 - 46 % Final  . Lymphs Abs 09/18/2013 1.9  0.7 - 4.0 K/uL Final  . Monocytes Relative 09/18/2013 8  3 - 12 % Final  . Monocytes Absolute 09/18/2013 0.4  0.1 - 1.0 K/uL Final  . Eosinophils Relative 09/18/2013 6* 0 - 5 % Final  . Eosinophils Absolute 09/18/2013 0.3  0.0 - 0.7 K/uL Final  . Basophils Relative 09/18/2013 0  0 - 1 % Final  . Basophils Absolute 09/18/2013 0.0  0.0 - 0.1 K/uL Final  . Sodium 09/18/2013 145  137 - 147 mEq/L Final  . Potassium 09/18/2013 3.7  3.7 - 5.3 mEq/L Final  . Chloride 09/18/2013 104  96 - 112 mEq/L Final  . CO2 09/18/2013 25  19 - 32 mEq/L Final  . Glucose, Bld 09/18/2013 96  70 - 99 mg/dL Final  . BUN 09/18/2013 9  6 - 23 mg/dL Final  . Creatinine, Ser 09/18/2013 1.08  0.50 -  1.10 mg/dL Final  . Calcium 09/18/2013 8.7  8.4 - 10.5 mg/dL Final  . Total Protein 09/18/2013 6.8  6.0 - 8.3 g/dL Final  . Albumin 09/18/2013 3.1* 3.5 - 5.2 g/dL Final  . AST 09/18/2013 22  0 - 37 U/L Final  . ALT 09/18/2013 13  0 - 35 U/L Final  . Alkaline Phosphatase 09/18/2013 69  39 - 117 U/L Final  . Total Bilirubin 09/18/2013 0.2* 0.3 - 1.2 mg/dL Final  . GFR calc non Af Amer 09/18/2013 45* >90 mL/min Final  . GFR calc Af Amer 09/18/2013 52* >90 mL/min Final   Comment: (NOTE)                           The eGFR has been calculated using the CKD EPI equation.                          This calculation has not been validated in all clinical situations.                          eGFR's persistently <90 mL/min signify possible Chronic Kidney                          Disease.  . Pro B Natriuretic peptide (BNP) 09/18/2013 177.3  0 - 450 pg/mL Final  . Troponin I 09/18/2013 <0.30  <0.30 ng/mL Final   Comment:                                 Due to the release kinetics of cTnI,                          a negative result within the first hours                          of the onset of symptoms does not rule out                          myocardial infarction with certainty.                          If myocardial infarction is still suspected,                          repeat the test at appropriate intervals.  . Color, Urine 09/18/2013 YELLOW  YELLOW Final  . APPearance 09/18/2013 CLEAR  CLEAR Final  . Specific Gravity, Urine 09/18/2013 1.010  1.005 - 1.030 Final  . pH 09/18/2013 5.5  5.0 - 8.0 Final  . Glucose, UA 09/18/2013 NEGATIVE  NEGATIVE mg/dL Final  . Hgb urine dipstick 09/18/2013 NEGATIVE  NEGATIVE Final  . Bilirubin Urine 09/18/2013 NEGATIVE  NEGATIVE Final  . Ketones, ur 09/18/2013 NEGATIVE  NEGATIVE mg/dL Final  . Protein, ur 09/18/2013 NEGATIVE  NEGATIVE mg/dL Final  . Urobilinogen, UA 09/18/2013 0.2  0.0 - 1.0 mg/dL Final  . Nitrite 09/18/2013 NEGATIVE  NEGATIVE Final  . Leukocytes, UA 09/18/2013 NEGATIVE  NEGATIVE Final   MICROSCOPIC NOT DONE ON URINES WITH NEGATIVE PROTEIN, BLOOD, LEUKOCYTES, NITRITE, OR GLUCOSE <1000 mg/dL.  Marland Kitchen Prothrombin Time  09/18/2013 21.5* 11.6 - 15.2 seconds Final  . INR 09/18/2013 1.93* 0.00 - 1.49 Final  . Sodium 09/19/2013 140  137 - 147 mEq/L Final  . Potassium 09/19/2013 4.9  3.7 - 5.3 mEq/L Final  . Chloride 09/19/2013 103  96 - 112 mEq/L Final  . CO2 09/19/2013 22  19 - 32 mEq/L Final  . Glucose, Bld 09/19/2013 135* 70 - 99 mg/dL Final  . BUN  09/19/2013 12  6 - 23 mg/dL Final  . Creatinine, Ser 09/19/2013 1.03  0.50 - 1.10 mg/dL Final  . Calcium 09/19/2013 8.8  8.4 - 10.5 mg/dL Final  . GFR calc non Af Amer 09/19/2013 47* >90 mL/min Final  . GFR calc Af Amer 09/19/2013 55* >90 mL/min Final   Comment: (NOTE)                          The eGFR has been calculated using the CKD EPI equation.                          This calculation has not been validated in all clinical situations.                          eGFR's persistently <90 mL/min signify possible Chronic Kidney                          Disease.  . WBC 09/19/2013 6.3  4.0 - 10.5 K/uL Final  . RBC 09/19/2013 5.33* 3.87 - 5.11 MIL/uL Final  . Hemoglobin 09/19/2013 14.2  12.0 - 15.0 g/dL Final  . HCT 09/19/2013 42.4  36.0 - 46.0 % Final  . MCV 09/19/2013 79.5  78.0 - 100.0 fL Final  . MCH 09/19/2013 26.6  26.0 - 34.0 pg Final  . MCHC 09/19/2013 33.5  30.0 - 36.0 g/dL Final  . RDW 09/19/2013 16.1* 11.5 - 15.5 % Final  . Platelets 09/19/2013 196  150 - 400 K/uL Final  . Influenza A By PCR 09/19/2013 NEGATIVE  NEGATIVE Final  . Influenza B By PCR 09/19/2013 NEGATIVE  NEGATIVE Final  . H1N1 flu by pcr 09/19/2013 NOT DETECTED  NOT DETECTED Final   Comment:                                 The Xpert Flu assay (FDA approved for                          nasal aspirates or washes and                          nasopharyngeal swab specimens), is                          intended as an aid in the diagnosis of                          influenza and should not be used as                          a sole basis for treatment.  . Prothrombin Time 09/19/2013 21.0* 11.6 - 15.2 seconds  Final  . INR 09/19/2013 1.87* 0.00 - 1.49 Final  . Prothrombin Time 09/20/2013 24.7* 11.6 - 15.2 seconds Final  . INR 09/20/2013 2.32* 0.00 - 1.49 Final  . Glucose-Capillary 09/20/2013 136* 70 - 99 mg/dL Final  Office Visit on 08/29/2013  Component Date Value Ref Range Status  . INR 08/29/2013 2.9   Final      Assessment/Plan  1. Folliculitis healing  2. Alzheimer's disease Slow progression. There is likely a significant component of vascular disease as well  3. ANXIETY DEPRESSION Consider addition of medications if this worsens  4. BACK PAIN, LUMBAR, CHRONIC unchanged  5. Chronic venous insufficiency stable  6. GERD (gastroesophageal reflux disease) stable  7. GI bleed No reoccurrence since hospitalization in early April 2015.  8. GLAUCOMA stable  9. HYPERTENSION controlled - Comprehensive metabolic panel; Future  10. HYPOTHYROIDISM compensated - TSH; Future  11. SEIZURE DISORDER None since 2013  12. Weakness generalized - CBC With differential/Platelet; Future  13. Peripheral arterial disease unchanged  14. Leg pain unchanged  15. Incontinence of urine continue incontinence briefs  16. Long term (current) use of anticoagulants - Protime-INR; Future  17. Chronic anticoagulation  18. ADENOCARCINOMA, LEFT BREAST Post surgical removal with mastectomy in 2011  19. Hair loss Minor issue now  20. Pain in the groin intermittent  21. Pain of right lower leg intermittent  22. Bronchiolitis - Fluticasone-Salmeterol (ADVAIR) 250-50 MCG/DOSE AEPB; Inhale one puff every 12 hours to help breathing  Dispense: 60 each; Refill: 2  23. Cerebral atrophy unchanged  24. Cerebrovascular disease Unchanged.   25. 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 Blindness of both eyes Due to glaucoma and occipital infarcts

## 2013-11-09 ENCOUNTER — Encounter: Payer: Self-pay | Admitting: Internal Medicine

## 2013-11-09 DIAGNOSIS — I679 Cerebrovascular disease, unspecified: Secondary | ICD-10-CM | POA: Insufficient documentation

## 2013-11-09 DIAGNOSIS — H543 Unqualified visual loss, both eyes: Secondary | ICD-10-CM | POA: Insufficient documentation

## 2013-11-09 DIAGNOSIS — G319 Degenerative disease of nervous system, unspecified: Secondary | ICD-10-CM | POA: Insufficient documentation

## 2013-11-09 DIAGNOSIS — I639 Cerebral infarction, unspecified: Secondary | ICD-10-CM | POA: Insufficient documentation

## 2013-11-16 ENCOUNTER — Encounter: Payer: Self-pay | Admitting: Internal Medicine

## 2013-11-16 ENCOUNTER — Ambulatory Visit (INDEPENDENT_AMBULATORY_CARE_PROVIDER_SITE_OTHER): Payer: Medicare Other | Admitting: Internal Medicine

## 2013-11-16 VITALS — BP 130/82 | HR 60 | Temp 98.4°F | Resp 18

## 2013-11-16 DIAGNOSIS — K5900 Constipation, unspecified: Secondary | ICD-10-CM

## 2013-11-16 DIAGNOSIS — M545 Low back pain, unspecified: Secondary | ICD-10-CM

## 2013-11-16 DIAGNOSIS — I471 Supraventricular tachycardia: Secondary | ICD-10-CM | POA: Insufficient documentation

## 2013-11-16 MED ORDER — METHADONE HCL 5 MG PO TABS: ORAL_TABLET | ORAL | Status: DC

## 2013-11-16 NOTE — Patient Instructions (Signed)
Use medication as listed. See Dr. Burt Knack when scheduled.

## 2013-11-16 NOTE — Progress Notes (Signed)
Patient ID: Jennifer Brennan, female   DOB: 1924/02/07, 80 y.o.   MRN: [id number]    Location:  [facility]  Place of Service: OFFICE   Allergies  Allergen Reactions  . Bactrim [Sulfamethoxazole-Trimethoprim] Other (See Comments)    MD stopped due to kidney failure Also causes lips to swell  . Doxycycline Swelling    Causes lips to swell, wheezing  . Furosemide Other (See Comments)    MD stopped due to kidney failure  . Percocet [Oxycodone-Acetaminophen] Other (See Comments)    Causes SEIZURES  . Valsartan Other (See Comments)    MD stopped due to kidney failure  . Adhesive [Tape]   . Penicillins Hives  . Morphine Sulfate Itching and Rash  . Sertraline Hcl Rash    Chief Complaint  Patient presents with  . Acute Visit    rapid pulse    HPI:  Paroxysmal supraventricular tachycardia - Had 2 episodes of very rapid heart rate and accompanying weakness. The first was about 3 weeks ago. The 2nd was 11/12/13. There was no chest pain or diaphoresis. Heart rate was up to 250 on a pulse oximeter she has at home. She felt like she might black out, but did not. The episodes lasted a couple of minutes at most. Her daughter was with her both times. No nausea. When she recovered, she was back to here usual self quickly.  Constipation:: stools are loose, but she is still taking the full dose of Miralax--17 grams.  BACK PAIN, LUMBAR, CHRONIC - daughter says she is having a reaction to the glue in the fentanyl patch.    Medications: Patient's Medications  New Prescriptions   No medications on file  Previous Medications   ACETAMINOPHEN (TYLENOL) 500 MG TABLET    Take 1,000 mg by mouth every 6 (six) hours as needed for mild pain, fever or headache. pain   ALBUTEROL (PROVENTIL HFA;VENTOLIN HFA) 108 (90 BASE) MCG/ACT INHALER    Inhale 2 puffs into the lungs every 6 (six) hours as needed. For shortness of breath.   BRIMONIDINE (ALPHAGAN) 0.15 % OPHTHALMIC SOLUTION    Place 1 drop into both eyes 2 (two)  times daily.   CHOLECALCIFEROL 2000 UNITS TABS    Take 1 tablet by mouth every morning.   CRANBERRY-VITAMIN C-VITAMIN E 4200-20-3 MG-MG-UNIT CAPS    Take by mouth. Take one tablet once a day   DILTIAZEM 2 % GEL    Apply 1 application topically 2 (two) times daily. Apply to anal fissures. Place a small amount just inside and outside of anus as needed   DORZOLAMIDE-TIMOLOL (COSOPT) 22.3-6.8 MG/ML OPHTHALMIC SOLUTION    Place 1 drop into both eyes 2 (two) times daily.     FENTANYL (DURAGESIC - DOSED MCG/HR) 12 MCG/HR    Place 1 patch (12.5 mcg total) onto the skin every 3 (three) days.   FLUTICASONE-SALMETEROL (ADVAIR) 250-50 MCG/DOSE AEPB    Inhale one puff every 12 hours to help breathing   GABAPENTIN (NEURONTIN) 300 MG CAPSULE    take 2 capsules by mouth at bedtime for pain   IPRATROPIUM-ALBUTEROL (COMBIVENT RESPIMAT) 20-100 MCG/ACT AERS RESPIMAT    Inhale 2 puffs into the lungs every 6 (six) hours. Take 2 puffs 4 times daily to help with breathing as needed.   LATANOPROST (XALATAN) 0.005 % OPHTHALMIC SOLUTION    Place 1 drop into both eyes at bedtime.     LETROZOLE (FEMARA) 2.5 MG TABLET    Take 2.5 mg by mouth at bedtime.  LEVETIRACETAM (KEPPRA) 500 MG TABLET    Take 500 mg by mouth 2 (two) times daily.   LEVOTHYROXINE (SYNTHROID, LEVOTHROID) 75 MCG TABLET    Take 1 tablet (75 mcg total) by mouth daily before breakfast.   LOSARTAN (COZAAR) 50 MG TABLET    Take one tablet by mouth once daily to control blood pressure   MEMANTINE HCL ER 28 MG CP24    Take 28 mg by mouth daily. One daily to help preserve memory   MULTIPLE VITAMIN (MULTIVITAMIN) TABLET    Centrum Silver:  Take 1 tablet daily   OMEPRAZOLE (PRILOSEC) 20 MG CAPSULE    Take 20 mg by mouth every other day. Take 1 tablet every other day to reduce stomach acids.   PILOCARPINE (PILOCAR) 1 % OPHTHALMIC SOLUTION    Place 1 drop into both eyes 3 (three) times daily.    POLYETHYLENE GLYCOL POWDER (GLYCOLAX) POWDER    17 gram in 4-8 oz fluid  daily. Adjust as needed to achieve 1-2 soft stools daily.   PROBIOTIC PRODUCT (ALIGN) 4 MG CAPS    Take by mouth. Take 1 capsule daily to help irritable colon. Only while on antibiotics.   WARFARIN (COUMADIN) 5 MG TABLET    Take 1/2 tablet (2.66m) daily except 1 tablet (585m on Mondays and Fridays  Modified Medications   No medications on file  Discontinued Medications   No medications on file     Review of Systems  Constitutional: Positive for fatigue. Negative for fever, diaphoresis, activity change, appetite change and unexpected weight change.  HENT: Positive for hearing loss. Negative for congestion and ear pain.   Eyes: Positive for visual disturbance.       Nearly blind bilaterally.  Respiratory: Negative for apnea, cough, choking, chest tightness, shortness of breath and wheezing.   Cardiovascular: Positive for leg swelling. Negative for chest pain and palpitations.  Gastrointestinal: Negative for abdominal pain and abdominal distention.  Endocrine: Negative for cold intolerance, polydipsia, polyphagia and polyuria.  Genitourinary: Positive for frequency. Negative for decreased urine volume, difficulty urinating and pelvic pain.       Urinary incontinence.  Musculoskeletal: Positive for arthralgias, gait problem and myalgias. Negative for joint swelling, neck pain and neck stiffness.       History of back and leg pains.  Skin:       Single red lesion on the anterior left upper leg.  Allergic/Immunologic: Negative.   Neurological: Positive for weakness. Negative for dizziness, tremors, seizures, syncope, facial asymmetry, speech difficulty, light-headedness, numbness and headaches.  Hematological: Negative.   Psychiatric/Behavioral: Positive for confusion, sleep disturbance, decreased concentration and agitation. Negative for suicidal ideas, hallucinations and behavioral problems. The patient is not nervous/anxious and is not hyperactive.     Filed Vitals:   11/16/13 1506  BP:  130/82  Pulse: 60  Temp: 98.4 F (36.9 C)  TempSrc: Oral  Resp: 18  SpO2: 94%   There is no weight on file to calculate BMI.  Physical Exam  Constitutional: No distress.  HENT:  Head: Normocephalic and atraumatic.  Diminished hearing bilaterally. Exposure of root of the lower left canine.  Eyes: Conjunctivae and EOM are normal. Left eye exhibits no discharge.  Nearly blindbilaterally  Neck: Normal range of motion. Neck supple. No JVD present. No tracheal deviation present. No thyromegaly present.  Cardiovascular: Normal rate, regular rhythm and normal heart sounds.  Exam reveals no gallop and no friction rub.   No murmur heard. Pulmonary/Chest: Effort normal and breath sounds normal. No  respiratory distress. She has no wheezes. She has no rales.  Abdominal: Bowel sounds are normal. She exhibits no distension and no mass. There is no tenderness.  Musculoskeletal: Normal range of motion. She exhibits edema and tenderness.  Tender at both knees. Tender in the right groin area.  Lymphadenopathy:    She has no cervical adenopathy.  Neurological: She is alert. No cranial nerve deficit. Coordination normal.  Memory deficit.  Skin: Skin is warm and dry. No rash noted. She is not diaphoretic. No erythema. No pallor.  2 mm red spot on the anterior left thigh that appears to have been a small blister of folliculitis. It is healing. There is mild discoloration of the skin around this lesion that was likely an inflammatory response that is now subsiding.  Psychiatric:  Significant dementia. Calm at our office today.. Improvement episodes of agitation at home.     Labs reviewed: Office Visit on 10/31/2013  Component Date Value Ref Range Status  . INR 10/31/2013 2.6   Final  Office Visit on 10/03/2013  Component Date Value Ref Range Status  . INR 10/03/2013 1.6   Final  Admission on 09/25/2013, Discharged on 09/26/2013  Component Date Value Ref Range Status  . Fecal Occult Bld  09/25/2013 POSITIVE* NEGATIVE Final  . WBC 09/25/2013 8.4  4.0 - 10.5 K/uL Final  . RBC 09/25/2013 5.29* 3.87 - 5.11 MIL/uL Final  . Hemoglobin 09/25/2013 14.3  12.0 - 15.0 g/dL Final  . HCT 09/25/2013 41.9  36.0 - 46.0 % Final  . MCV 09/25/2013 79.2  78.0 - 100.0 fL Final  . MCH 09/25/2013 27.0  26.0 - 34.0 pg Final  . MCHC 09/25/2013 34.1  30.0 - 36.0 g/dL Final  . RDW 09/25/2013 15.7* 11.5 - 15.5 % Final  . Platelets 09/25/2013 214  150 - 400 K/uL Final  . Neutrophils Relative % 09/25/2013 51  43 - 77 % Final  . Neutro Abs 09/25/2013 4.3  1.7 - 7.7 K/uL Final  . Lymphocytes Relative 09/25/2013 37  12 - 46 % Final  . Lymphs Abs 09/25/2013 3.1  0.7 - 4.0 K/uL Final  . Monocytes Relative 09/25/2013 8  3 - 12 % Final  . Monocytes Absolute 09/25/2013 0.7  0.1 - 1.0 K/uL Final  . Eosinophils Relative 09/25/2013 3  0 - 5 % Final  . Eosinophils Absolute 09/25/2013 0.2  0.0 - 0.7 K/uL Final  . Basophils Relative 09/25/2013 1  0 - 1 % Final  . Basophils Absolute 09/25/2013 0.0  0.0 - 0.1 K/uL Final  . Sodium 09/25/2013 141  137 - 147 mEq/L Final  . Potassium 09/25/2013 3.8  3.7 - 5.3 mEq/L Final  . Chloride 09/25/2013 97  96 - 112 mEq/L Final  . CO2 09/25/2013 30  19 - 32 mEq/L Final  . Glucose, Bld 09/25/2013 120* 70 - 99 mg/dL Final  . BUN 09/25/2013 18  6 - 23 mg/dL Final  . Creatinine, Ser 09/25/2013 1.10  0.50 - 1.10 mg/dL Final  . Calcium 09/25/2013 9.4  8.4 - 10.5 mg/dL Final  . Total Protein 09/25/2013 7.0  6.0 - 8.3 g/dL Final  . Albumin 09/25/2013 3.1* 3.5 - 5.2 g/dL Final  . AST 09/25/2013 21  0 - 37 U/L Final  . ALT 09/25/2013 16  0 - 35 U/L Final  . Alkaline Phosphatase 09/25/2013 70  39 - 117 U/L Final  . Total Bilirubin 09/25/2013 0.2* 0.3 - 1.2 mg/dL Final  . GFR calc non Af Wyvonnia Lora  09/25/2013 44* >90 mL/min Final  . GFR calc Af Amer 09/25/2013 50* >90 mL/min Final   Comment: (NOTE)                          The eGFR has been calculated using the CKD EPI equation.                           This calculation has not been validated in all clinical situations.                          eGFR's persistently <90 mL/min signify possible Chronic Kidney                          Disease.  Marland Kitchen Prothrombin Time 09/25/2013 33.3* 11.6 - 15.2 seconds Final  . INR 09/25/2013 3.43* 0.00 - 1.49 Final  . Troponin I 09/25/2013 <0.30  <0.30 ng/mL Final   Comment:                                 Due to the release kinetics of cTnI,                          a negative result within the first hours                          of the onset of symptoms does not rule out                          myocardial infarction with certainty.                          If myocardial infarction is still suspected,                          repeat the test at appropriate intervals.  . Pro B Natriuretic peptide (BNP) 09/25/2013 187.0  0 - 450 pg/mL Final  . Color, Urine 09/25/2013 YELLOW  YELLOW Final  . APPearance 09/25/2013 CLEAR  CLEAR Final  . Specific Gravity, Urine 09/25/2013 1.010  1.005 - 1.030 Final  . pH 09/25/2013 6.5  5.0 - 8.0 Final  . Glucose, UA 09/25/2013 NEGATIVE  NEGATIVE mg/dL Final  . Hgb urine dipstick 09/25/2013 MODERATE* NEGATIVE Final  . Bilirubin Urine 09/25/2013 NEGATIVE  NEGATIVE Final  . Ketones, ur 09/25/2013 NEGATIVE  NEGATIVE mg/dL Final  . Protein, ur 09/25/2013 NEGATIVE  NEGATIVE mg/dL Final  . Urobilinogen, UA 09/25/2013 0.2  0.0 - 1.0 mg/dL Final  . Nitrite 09/25/2013 NEGATIVE  NEGATIVE Final  . Leukocytes, UA 09/25/2013 NEGATIVE  NEGATIVE Final  . aPTT 09/25/2013 41* 24 - 37 seconds Final   Comment:                                 IF BASELINE aPTT IS ELEVATED,                          SUGGEST PATIENT RISK ASSESSMENT  BE USED TO DETERMINE APPROPRIATE                          ANTICOAGULANT THERAPY.  . ABO/RH(D) 09/25/2013 O POS   Final  . Antibody Screen 09/25/2013 NEG   Final  . Sample Expiration 09/25/2013 09/28/2013   Final  . Squamous  Epithelial / LPF 09/25/2013 RARE  RARE Final  . RBC / HPF 09/25/2013 3-6  <3 RBC/hpf Final  . Bacteria, UA 09/25/2013 RARE  RARE Final  . WBC 09/25/2013 9.6  4.0 - 10.5 K/uL Final  . RBC 09/25/2013 5.26* 3.87 - 5.11 MIL/uL Final  . Hemoglobin 09/25/2013 14.2  12.0 - 15.0 g/dL Final  . HCT 09/25/2013 41.7  36.0 - 46.0 % Final  . MCV 09/25/2013 79.3  78.0 - 100.0 fL Final  . MCH 09/25/2013 27.0  26.0 - 34.0 pg Final  . MCHC 09/25/2013 34.1  30.0 - 36.0 g/dL Final  . RDW 09/25/2013 15.5  11.5 - 15.5 % Final  . Platelets 09/25/2013 197  150 - 400 K/uL Final  . Prothrombin Time 09/25/2013 21.3* 11.6 - 15.2 seconds Final  . INR 09/25/2013 1.91* 0.00 - 1.49 Final  . WBC 09/26/2013 9.1  4.0 - 10.5 K/uL Final  . RBC 09/26/2013 4.82  3.87 - 5.11 MIL/uL Final  . Hemoglobin 09/26/2013 12.8  12.0 - 15.0 g/dL Final  . HCT 09/26/2013 38.4  36.0 - 46.0 % Final  . MCV 09/26/2013 79.7  78.0 - 100.0 fL Final  . MCH 09/26/2013 26.6  26.0 - 34.0 pg Final  . MCHC 09/26/2013 33.3  30.0 - 36.0 g/dL Final  . RDW 09/26/2013 15.7* 11.5 - 15.5 % Final  . Platelets 09/26/2013 217  150 - 400 K/uL Final  . Glucose-Capillary 09/26/2013 107* 70 - 99 mg/dL Final  . Comment 1 09/26/2013 Documented in Chart   Final  . Comment 2 09/26/2013 Notify RN   Final  . Prothrombin Time 09/26/2013 15.5* 11.6 - 15.2 seconds Final  . INR 09/26/2013 1.26  0.00 - 1.49 Final  Office Visit on 09/21/2013  Component Date Value Ref Range Status  . WBC 09/21/2013 9.6  3.4 - 10.8 x10E3/uL Final  . RBC 09/21/2013 5.54* 3.77 - 5.28 x10E6/uL Final  . Hemoglobin 09/21/2013 14.5  11.1 - 15.9 g/dL Final  . HCT 09/21/2013 41.8  34.0 - 46.6 % Final  . MCV 09/21/2013 76* 79 - 97 fL Final  . MCH 09/21/2013 26.2* 26.6 - 33.0 pg Final  . MCHC 09/21/2013 34.7  31.5 - 35.7 g/dL Final  . RDW 09/21/2013 15.8* 12.3 - 15.4 % Final  . Platelets 09/21/2013 197  150 - 379 x10E3/uL Final  . Neutrophils Relative % 09/21/2013 71   Final  . Lymphs 09/21/2013  17   Final  . Monocytes 09/21/2013 8   Final  . Eos 09/21/2013 4   Final  . Basos 09/21/2013 0   Final  . Neutrophils Absolute 09/21/2013 6.9  1.4 - 7.0 x10E3/uL Final  . Lymphocytes Absolute 09/21/2013 1.6  0.7 - 3.1 x10E3/uL Final  . Monocytes Absolute 09/21/2013 0.7  0.1 - 0.9 x10E3/uL Final  . Eosinophils Absolute 09/21/2013 0.4  0.0 - 0.4 x10E3/uL Final  . Basophils Absolute 09/21/2013 0.0  0.0 - 0.2 x10E3/uL Final  . Immature Granulocytes 09/21/2013 0   Final  . Immature Grans (Abs) 09/21/2013 0.0  0.0 - 0.1 x10E3/uL Final  . Hematology Comments: 09/21/2013 Note:   Final  Verified by microscopic examination.  . Glucose 09/21/2013 100* 65 - 99 mg/dL Final  . BUN 09/21/2013 21  8 - 27 mg/dL Final  . Creatinine, Ser 09/21/2013 1.22* 0.57 - 1.00 mg/dL Final  . GFR calc non Af Amer 09/21/2013 40* >59 mL/min/1.73 Final  . GFR calc Af Amer 09/21/2013 46* >59 mL/min/1.73 Final  . BUN/Creatinine Ratio 09/21/2013 17  11 - 26 Final  . Sodium 09/21/2013 145* 134 - 144 mmol/L Final  . Potassium 09/21/2013 4.0  3.5 - 5.2 mmol/L Final  . Chloride 09/21/2013 99  97 - 108 mmol/L Final  . CO2 09/21/2013 23  18 - 29 mmol/L Final  . Calcium 09/21/2013 9.3  8.7 - 10.3 mg/dL Final  . Total Protein 09/21/2013 7.1  6.0 - 8.5 g/dL Final  . Albumin 09/21/2013 4.2  3.5 - 4.7 g/dL Final  . Globulin, Total 09/21/2013 2.9  1.5 - 4.5 g/dL Final  . Albumin/Globulin Ratio 09/21/2013 1.4  1.1 - 2.5 Final  . Total Bilirubin 09/21/2013 0.3  0.0 - 1.2 mg/dL Final  . Alkaline Phosphatase 09/21/2013 74  39 - 117 IU/L Final  . AST 09/21/2013 34  0 - 40 IU/L Final  . ALT 09/21/2013 18  0 - 32 IU/L Final  . INR 09/21/2013 3.2* 0.8 - 1.2 Final   Comment: Reference interval is for non-anticoagulated patients.                          Suggested INR therapeutic range for Vitamin K                          antagonist therapy:                             Standard Dose (moderate intensity                                             therapeutic range):       2.0 - 3.0                             Higher intensity therapeutic range       2.5 - 3.5  . Prothrombin Time 09/21/2013 33.9* 9.1 - 12.0 sec Final  Admission on 09/18/2013, Discharged on 09/20/2013  Component Date Value Ref Range Status  . WBC 09/18/2013 5.3  4.0 - 10.5 K/uL Final  . RBC 09/18/2013 5.20* 3.87 - 5.11 MIL/uL Final  . Hemoglobin 09/18/2013 14.2  12.0 - 15.0 g/dL Final  . HCT 09/18/2013 40.9  36.0 - 46.0 % Final  . MCV 09/18/2013 78.7  78.0 - 100.0 fL Final  . MCH 09/18/2013 27.3  26.0 - 34.0 pg Final  . MCHC 09/18/2013 34.7  30.0 - 36.0 g/dL Final  . RDW 09/18/2013 15.7* 11.5 - 15.5 % Final  . Platelets 09/18/2013 190  150 - 400 K/uL Final  . Neutrophils Relative % 09/18/2013 51  43 - 77 % Final  . Neutro Abs 09/18/2013 2.7  1.7 - 7.7 K/uL Final  . Lymphocytes Relative 09/18/2013 35  12 - 46 % Final  . Lymphs Abs 09/18/2013 1.9  0.7 - 4.0 K/uL Final  . Monocytes Relative 09/18/2013 8  3 -  12 % Final  . Monocytes Absolute 09/18/2013 0.4  0.1 - 1.0 K/uL Final  . Eosinophils Relative 09/18/2013 6* 0 - 5 % Final  . Eosinophils Absolute 09/18/2013 0.3  0.0 - 0.7 K/uL Final  . Basophils Relative 09/18/2013 0  0 - 1 % Final  . Basophils Absolute 09/18/2013 0.0  0.0 - 0.1 K/uL Final  . Sodium 09/18/2013 145  137 - 147 mEq/L Final  . Potassium 09/18/2013 3.7  3.7 - 5.3 mEq/L Final  . Chloride 09/18/2013 104  96 - 112 mEq/L Final  . CO2 09/18/2013 25  19 - 32 mEq/L Final  . Glucose, Bld 09/18/2013 96  70 - 99 mg/dL Final  . BUN 09/18/2013 9  6 - 23 mg/dL Final  . Creatinine, Ser 09/18/2013 1.08  0.50 - 1.10 mg/dL Final  . Calcium 09/18/2013 8.7  8.4 - 10.5 mg/dL Final  . Total Protein 09/18/2013 6.8  6.0 - 8.3 g/dL Final  . Albumin 09/18/2013 3.1* 3.5 - 5.2 g/dL Final  . AST 09/18/2013 22  0 - 37 U/L Final  . ALT 09/18/2013 13  0 - 35 U/L Final  . Alkaline Phosphatase 09/18/2013 69  39 - 117 U/L Final  . Total Bilirubin 09/18/2013 0.2* 0.3  - 1.2 mg/dL Final  . GFR calc non Af Amer 09/18/2013 45* >90 mL/min Final  . GFR calc Af Amer 09/18/2013 52* >90 mL/min Final   Comment: (NOTE)                          The eGFR has been calculated using the CKD EPI equation.                          This calculation has not been validated in all clinical situations.                          eGFR's persistently <90 mL/min signify possible Chronic Kidney                          Disease.  . Pro B Natriuretic peptide (BNP) 09/18/2013 177.3  0 - 450 pg/mL Final  . Troponin I 09/18/2013 <0.30  <0.30 ng/mL Final   Comment:                                 Due to the release kinetics of cTnI,                          a negative result within the first hours                          of the onset of symptoms does not rule out                          myocardial infarction with certainty.                          If myocardial infarction is still suspected,                          repeat the test at appropriate intervals.  Marland Kitchen  Color, Urine 09/18/2013 YELLOW  YELLOW Final  . APPearance 09/18/2013 CLEAR  CLEAR Final  . Specific Gravity, Urine 09/18/2013 1.010  1.005 - 1.030 Final  . pH 09/18/2013 5.5  5.0 - 8.0 Final  . Glucose, UA 09/18/2013 NEGATIVE  NEGATIVE mg/dL Final  . Hgb urine dipstick 09/18/2013 NEGATIVE  NEGATIVE Final  . Bilirubin Urine 09/18/2013 NEGATIVE  NEGATIVE Final  . Ketones, ur 09/18/2013 NEGATIVE  NEGATIVE mg/dL Final  . Protein, ur 09/18/2013 NEGATIVE  NEGATIVE mg/dL Final  . Urobilinogen, UA 09/18/2013 0.2  0.0 - 1.0 mg/dL Final  . Nitrite 09/18/2013 NEGATIVE  NEGATIVE Final  . Leukocytes, UA 09/18/2013 NEGATIVE  NEGATIVE Final   MICROSCOPIC NOT DONE ON URINES WITH NEGATIVE PROTEIN, BLOOD, LEUKOCYTES, NITRITE, OR GLUCOSE <1000 mg/dL.  Marland Kitchen Prothrombin Time 09/18/2013 21.5* 11.6 - 15.2 seconds Final  . INR 09/18/2013 1.93* 0.00 - 1.49 Final  . Sodium 09/19/2013 140  137 - 147 mEq/L Final  . Potassium 09/19/2013 4.9  3.7 - 5.3  mEq/L Final  . Chloride 09/19/2013 103  96 - 112 mEq/L Final  . CO2 09/19/2013 22  19 - 32 mEq/L Final  . Glucose, Bld 09/19/2013 135* 70 - 99 mg/dL Final  . BUN 09/19/2013 12  6 - 23 mg/dL Final  . Creatinine, Ser 09/19/2013 1.03  0.50 - 1.10 mg/dL Final  . Calcium 09/19/2013 8.8  8.4 - 10.5 mg/dL Final  . GFR calc non Af Amer 09/19/2013 47* >90 mL/min Final  . GFR calc Af Amer 09/19/2013 55* >90 mL/min Final   Comment: (NOTE)                          The eGFR has been calculated using the CKD EPI equation.                          This calculation has not been validated in all clinical situations.                          eGFR's persistently <90 mL/min signify possible Chronic Kidney                          Disease.  . WBC 09/19/2013 6.3  4.0 - 10.5 K/uL Final  . RBC 09/19/2013 5.33* 3.87 - 5.11 MIL/uL Final  . Hemoglobin 09/19/2013 14.2  12.0 - 15.0 g/dL Final  . HCT 09/19/2013 42.4  36.0 - 46.0 % Final  . MCV 09/19/2013 79.5  78.0 - 100.0 fL Final  . MCH 09/19/2013 26.6  26.0 - 34.0 pg Final  . MCHC 09/19/2013 33.5  30.0 - 36.0 g/dL Final  . RDW 09/19/2013 16.1* 11.5 - 15.5 % Final  . Platelets 09/19/2013 196  150 - 400 K/uL Final  . Influenza A By PCR 09/19/2013 NEGATIVE  NEGATIVE Final  . Influenza B By PCR 09/19/2013 NEGATIVE  NEGATIVE Final  . H1N1 flu by pcr 09/19/2013 NOT DETECTED  NOT DETECTED Final   Comment:                                 The Xpert Flu assay (FDA approved for                          nasal aspirates or  washes and                          nasopharyngeal swab specimens), is                          intended as an aid in the diagnosis of                          influenza and should not be used as                          a sole basis for treatment.  . Prothrombin Time 09/19/2013 21.0* 11.6 - 15.2 seconds Final  . INR 09/19/2013 1.87* 0.00 - 1.49 Final  . Prothrombin Time 09/20/2013 24.7* 11.6 - 15.2 seconds Final  . INR 09/20/2013 2.32* 0.00 - 1.49  Final  . Glucose-Capillary 09/20/2013 136* 70 - 99 mg/dL Final  Office Visit on 08/29/2013  Component Date Value Ref Range Status  . INR 08/29/2013 2.9   Final      Assessment/Plan  1. Paroxysmal supraventricular tachycardia - Ambulatory referral to Cardiology-- Dr. Burt Knack  2. Constipation Reduce Miralax dose due to loose stools  3. BACK PAIN, LUMBAR, CHRONIC Stop fentanyl and try methadone - methadone (DOLOPHINE) 5 MG tablet; One every 12 hours if needed for pain  Dispense: 30 tablet; Refill: 0

## 2013-11-28 ENCOUNTER — Ambulatory Visit (INDEPENDENT_AMBULATORY_CARE_PROVIDER_SITE_OTHER): Payer: Medicare Other | Admitting: Pharmacotherapy

## 2013-11-28 ENCOUNTER — Encounter: Payer: Self-pay | Admitting: Pharmacotherapy

## 2013-11-28 VITALS — BP 126/82 | HR 63 | Temp 97.6°F | Ht 63.0 in | Wt 152.6 lb

## 2013-11-28 DIAGNOSIS — Z7901 Long term (current) use of anticoagulants: Secondary | ICD-10-CM

## 2013-11-28 LAB — POCT INR: INR: 1.8

## 2013-11-28 NOTE — Progress Notes (Signed)
Subjective:     Patient ID: SOPHIAH ROLIN, female   DOB: [birth year], 80 y.o.   MRN: [id number]  HPI Last INR on 10/31/13 was OK at 2.6 Current Coumadin dose is 2.5mg  QD except 5mg  M/F Denies missed doses. Denies unusual bleeding or bruising. Denies CP, palpitations, falls Did eat turnip greens last night. Fentanyl changed to methadone.  Review of Systems  Constitutional: Positive for fatigue.  HENT: Negative for nosebleeds.   Respiratory: Positive for shortness of breath.   Cardiovascular: Negative for chest pain and palpitations.  Gastrointestinal: Negative for blood in stool and anal bleeding.  Genitourinary: Negative for hematuria.       Objective:   Physical Exam  Constitutional: She is oriented to person, place, and time. She appears well-developed and well-nourished.  HENT:  Right Ear: External ear normal.  Left Ear: External ear normal.  Eyes:  She's blind   Cardiovascular: Normal rate, regular rhythm and normal heart sounds.   Pulmonary/Chest: Effort normal and breath sounds normal.  Musculoskeletal:  In wheelchair  Neurological: She is alert and oriented to person, place, and time.  Skin: Skin is warm and dry.  Psychiatric: She has a normal mood and affect. Her behavior is normal.   BP:  126/82   HR:  63  Wt:  152     Assessment:     INR below target 2-3 due to eating turnip greens last night.  INR 1.8    Plan:     1.  Continue current dose:  Coumadin 2.5mg  QD except 5mg  M/F 2.  RTC in 1 month

## 2013-11-28 NOTE — Patient Instructions (Signed)
INR 1.8 (low due to eating turnip greens last night) Continue Coumadin 2.5mg  daily except 5mg  on Mondays and Fridays

## 2013-12-09 ENCOUNTER — Encounter: Payer: Self-pay | Admitting: *Deleted

## 2013-12-09 ENCOUNTER — Encounter: Payer: Self-pay | Admitting: Cardiovascular Disease

## 2013-12-09 ENCOUNTER — Encounter (INDEPENDENT_AMBULATORY_CARE_PROVIDER_SITE_OTHER): Payer: Medicare Other

## 2013-12-09 ENCOUNTER — Ambulatory Visit (INDEPENDENT_AMBULATORY_CARE_PROVIDER_SITE_OTHER): Payer: Medicare Other | Admitting: Cardiovascular Disease

## 2013-12-09 VITALS — BP 142/74 | HR 51 | Ht 63.0 in | Wt 154.0 lb

## 2013-12-09 DIAGNOSIS — R55 Syncope and collapse: Secondary | ICD-10-CM

## 2013-12-09 DIAGNOSIS — R609 Edema, unspecified: Secondary | ICD-10-CM

## 2013-12-09 NOTE — Progress Notes (Signed)
HPI:  80 year-old woman presenting for followup evaluation. The patient was seen last in 2013. She is an elderly woman with dementia and blindness. She is essentially wheelchair-bound.  The patient has hypertension and chronic lower extremity swelling. She recently has been having episodes where she developed sudden onset of weakness and near syncope. She has not had frank syncope. Her daughter states that she has witnessed her "slump over." The patient has significant dementia and is unable to provide any meaningful history. Her daughter has witnessed 2 episodes. Apparently the patient's heart rate was noted to be very rapid during these episodes.  Outpatient Encounter Prescriptions as of 12/09/2013  Medication Sig  . acetaminophen (TYLENOL) 500 MG tablet Take 1,000 mg by mouth every 6 (six) hours as needed for mild pain, fever or headache. pain  . albuterol (PROVENTIL HFA;VENTOLIN HFA) 108 (90 BASE) MCG/ACT inhaler Inhale 2 puffs into the lungs every 6 (six) hours as needed. For shortness of breath.  . brimonidine (ALPHAGAN) 0.15 % ophthalmic solution Place 1 drop into both eyes 2 (two) times daily.  . Cholecalciferol 2000 UNITS TABS Take 1 tablet by mouth every morning.  . Cranberry-Vitamin C-Vitamin E 4200-20-3 MG-MG-UNIT CAPS Take by mouth. Take one tablet once a day  . diltiazem 2 % GEL Apply 1 application topically 2 (two) times daily. Apply to anal fissures. Place a small amount just inside and outside of anus as needed  . dorzolamide-timolol (COSOPT) 22.3-6.8 MG/ML ophthalmic solution Place 1 drop into both eyes 2 (two) times daily.    . Fluticasone-Salmeterol (ADVAIR) 250-50 MCG/DOSE AEPB Inhale one puff every 12 hours to help breathing  . gabapentin (NEURONTIN) 300 MG capsule take 2 capsules by mouth at bedtime for pain  . Ipratropium-Albuterol (COMBIVENT RESPIMAT) 20-100 MCG/ACT AERS respimat Inhale 2 puffs into the lungs every 6 (six) hours. Take 2 puffs 4 times daily to help with  breathing as needed.  . latanoprost (XALATAN) 0.005 % ophthalmic solution Place 1 drop into both eyes at bedtime.    Marland Kitchen letrozole (FEMARA) 2.5 MG tablet Take 2.5 mg by mouth at bedtime.  . levETIRAcetam (KEPPRA) 500 MG tablet Take 500 mg by mouth 2 (two) times daily.  Marland Kitchen levothyroxine (SYNTHROID, LEVOTHROID) 75 MCG tablet Take 1 tablet (75 mcg total) by mouth daily before breakfast.  . losartan (COZAAR) 50 MG tablet Take one tablet by mouth once daily to control blood pressure  . Memantine HCl ER 28 MG CP24 Take 28 mg by mouth daily. One daily to help preserve memory  . methadone (DOLOPHINE) 5 MG tablet Take 1.25 mg by mouth 2 times daily at 12 noon and 4 pm. One every 12 hours if needed for pain  . Multiple Vitamin (MULTIVITAMIN) tablet Centrum Silver:  Take 1 tablet daily  . omeprazole (PRILOSEC) 20 MG capsule Take 20 mg by mouth every other day. Take 1 tablet every other day to reduce stomach acids.  . pilocarpine (PILOCAR) 1 % ophthalmic solution Place 1 drop into both eyes 3 (three) times daily.   . polyethylene glycol powder (GLYCOLAX) powder 17 gram in 4-8 oz fluid daily. Adjust as needed to achieve 1-2 soft stools daily.  . Probiotic Product (ALIGN) 4 MG CAPS Take by mouth. Take 1 capsule daily to help irritable colon. Only while on antibiotics.  Marland Kitchen warfarin (COUMADIN) 5 MG tablet Take 1/2 tablet (2.5mg ) daily except 1 tablet (5mg ) on Mondays and Fridays  . [DISCONTINUED] methadone (DOLOPHINE) 5 MG tablet One every 12 hours if needed  for pain  . [DISCONTINUED] fentaNYL (DURAGESIC - DOSED MCG/HR) 12 MCG/HR Place 1 patch (12.5 mcg total) onto the skin every 3 (three) days.    Bactrim; Doxycycline; Furosemide; Percocet; Valsartan; Adhesive; Penicillins; Morphine sulfate; and Sertraline hcl  Past Medical History  Diagnosis Date  . Hypertension   . Hypothyroidism   . Seizure disorder   . Severe stage glaucoma     legally blind  . Depression with anxiety   . OAB (overactive bladder)   .  DVT 05/2007    RLE following R THR - chronic coumadin, chronic RLE pain  . GLAUCOMA   . OSTEOPENIA   . Retinal ischemia   . Sciatica of right side     chronic RLE pain, multifactorial - MRI T/L spine 02/2011  . VITAMIN D DEFICIENCY dx 08/2008  . Venous ulcer of right lower extremity with varicose veins   . Dementia   . Vulvar abscess   . Unspecified hypothyroidism   . Unspecified vitamin D deficiency   . Unspecified urinary incontinence   . Renal disorder   . Varicose veins of lower extremity   . Varicose veins of lower extremities with ulcer   . Unspecified disorder of kidney and ureter   . Stevens-Johnson syndrome   . Sebaceous cyst   . Disorder of bone and cartilage, unspecified   . Contusion of lower leg   . Depression   . Seizures   . Osteoporosis, unspecified 05/12/2013  . Acute asthmatic bronchitis 09/18/2013  . DVT, lower extremity   . ADENOCARCINOMA, LEFT BREAST 04/11/2010    s/p L mastectomy, on femara x 83yr  . OSTEOARTHRITIS, HANDS, BILATERAL   . Arthritis     "back; hands; toes" (09/26/2013)  . Chronic back pain     "upper and lower" (09/26/2013)  . Anxiety   . Blindness of both eyes     "from glaucoma". Also has bilateral occipital lobe infarcts.  . Cerebral atrophy   . Cerebrovascular disease   . Cerebral infarct     left parietal and bilateral occipital lobes    Past Surgical History  Procedure Laterality Date  . Total abdominal hysterectomy    . Total hip arthroplasty Right 10/2006  . Refractive surgery Bilateral   . Tonsillectomy    . Breast biopsy Left 03/2010  . Mastectomy Left 2011    History   Social History  . Marital Status: Married    Spouse Name: N/A    Number of Children: N/A  . Years of Education: N/A   Occupational History  . Not on file.   Social History Main Topics  . Smoking status: Former Smoker -- 1.00 packs/day for 45 years    Types: Cigarettes    Quit date: 10/16/1979  . Smokeless tobacco: Never Used  . Alcohol Use: Yes       Comment: 09/26/2013 "drank some years ago"  . Drug Use: No  . Sexual Activity: No   Other Topics Concern  . Not on file   Social History Narrative   Retired    Married        Never Smoked        Alcohol use-no                  Family History  Problem Relation Age of Onset  . Ovarian cancer Mother   . Cancer Mother   . Deep vein thrombosis Mother   . Heart disease Mother   . Arthritis Other  grandparents  . Lung cancer Other   . Heart disease Other     parent  . Cancer Father     BRAIN TUMOR  . Cancer Brother   . Hyperlipidemia Brother   . Hypertension Brother   . Stroke Brother   . Arthritis Brother   . Sickle cell anemia Daughter   . Heart disease Daughter   . Sickle cell anemia Daughter     ROS:  Unable to obtain an accurate review of systems because of the patient's inability to provide history  BP 142/74  Pulse 51  Ht 5\' 3"  (1.6 m)  Wt 69.854 kg (154 lb)  BMI 27.29 kg/m2  PHYSICAL EXAM: Pt is pleasant but disoriented, in no distress HEENT: normal Neck: JVP normal. Carotid upstrokes normal without bruits. No thyromegaly. Lungs: equal expansion, some wheezing bilaterally CV: Apex is discrete and nondisplaced, RRR without murmur or gallop Abd: soft, NT, +BS Back: no CVA tenderness Ext: 2+ bilateral pretibial edema  EKG:  Reviewed from 09/26/2013. This shows sinus rhythm with nonspecific ST abnormality  ASSESSMENT AND PLAN: 80 year old woman with dementia and blindness who has been having episodes of pre-syncope. Symptoms are concerning for arrhythmia considering profound nature and sudden onset. Recommend an echocardiogram and 48 hour Holter monitor. Unfortunately her resting heart rate is only 51 beats per minute, so if we diagnose supraventricular tachycardia arrhythmias, treatment options will probably be limited. Will followup after the results of her studies are completed.  Sherren Mocha 12/09/2013 1:49 PM

## 2013-12-09 NOTE — Patient Instructions (Signed)
Your physician has recommended that you wear a 48 hour holter monitor. Holter monitors are medical devices that record the heart's electrical activity. Doctors most often use these monitors to diagnose arrhythmias. Arrhythmias are problems with the speed or rhythm of the heartbeat. The monitor is a small, portable device. You can wear one while you do your normal daily activities. This is usually used to diagnose what is causing palpitations/syncope (passing out).  Your physician has requested that you have an echocardiogram. Echocardiography is a painless test that uses sound waves to create images of your heart. It provides your doctor with information about the size and shape of your heart and how well your heart's chambers and valves are working. This procedure takes approximately one hour. There are no restrictions for this procedure.  Your physician wants you to follow-up in: 1 YEAR with Dr Burt Knack.  You will receive a reminder letter in the mail two months in advance. If you don't receive a letter, please call our office to schedule the follow-up appointment.  Your physician recommends that you continue on your current medications as directed. Please refer to the Current Medication list given to you today.

## 2013-12-09 NOTE — Progress Notes (Signed)
Patient ID: Jennifer Brennan, female   DOB: [birth year], 80 y.o.   MRN: [id number] [facility] 48 hour holter monitor applied to patient.

## 2013-12-13 ENCOUNTER — Telehealth: Payer: Self-pay | Admitting: Cardiovascular Disease

## 2013-12-13 NOTE — Telephone Encounter (Signed)
Walk In pt Form " Monitor" pt Would Like to Talk about this will hold For Lauren

## 2013-12-16 ENCOUNTER — Telehealth: Payer: Self-pay | Admitting: Cardiovascular Disease

## 2013-12-16 NOTE — Telephone Encounter (Signed)
Follow Up    Following up on monitor results for pt. Please call.

## 2013-12-16 NOTE — Telephone Encounter (Signed)
Pt's daughter called for pt's 48 hours Holter monitor results. Pt's daughter is aware that the monitor results are in progress at this time. She is aware that the results will be send to MD for reviewing. Once MD review test, we will call her back with recommendations. Daughter verbalized understanding.

## 2013-12-21 NOTE — Telephone Encounter (Signed)
Pt's daughter aware of monitor results. Per Dr Burt Knack monitor shows NSR, single PVC's, short supraventricular runs.  Heart rate minimum 48, mean 60, maximum 103. No changes recommended at this time.

## 2013-12-26 ENCOUNTER — Ambulatory Visit (INDEPENDENT_AMBULATORY_CARE_PROVIDER_SITE_OTHER): Payer: Medicare Other | Admitting: Pharmacotherapy

## 2013-12-26 ENCOUNTER — Encounter: Payer: Self-pay | Admitting: Pharmacotherapy

## 2013-12-26 VITALS — BP 170/92 | HR 57 | Temp 97.5°F | Resp 18

## 2013-12-26 DIAGNOSIS — Z7901 Long term (current) use of anticoagulants: Secondary | ICD-10-CM

## 2013-12-26 LAB — POCT INR: INR: 1.4

## 2013-12-26 MED ORDER — WARFARIN SODIUM 5 MG PO TABS: ORAL_TABLET | ORAL | Status: DC

## 2013-12-26 NOTE — Patient Instructions (Signed)
INR 1.4 Increase Coumadin 5mg  (1 tablet) daily except 2.5mg  (1/2 tablet) on Tuesdays and Thursdays

## 2013-12-26 NOTE — Progress Notes (Signed)
Subjective:     Patient ID: Jennifer Brennan, female   DOB: 10-03-1923, 80 y.o.   MRN: [id number]  HPI Last INR was low at 1.8. Current Coumadin dose is 2.5mg  QD except 5mg  M/F She denies missed doses. Denies unusual bleeding or bruising. Denies CP, SOB, falls Did eat more vitamin K foods.   Review of Systems  HENT: Negative for nosebleeds.   Respiratory: Negative for shortness of breath.   Cardiovascular: Positive for leg swelling. Negative for chest pain.  Gastrointestinal: Negative for blood in stool and anal bleeding.  Genitourinary: Negative for hematuria.  Hematological: Does not bruise/bleed easily.       Objective:   Physical Exam  Constitutional: She is oriented to person, place, and time. She appears well-developed and well-nourished.  HENT:  Right Ear: External ear normal.  Left Ear: External ear normal.  Eyes:  blind  Cardiovascular: Normal rate, regular rhythm and normal heart sounds.   Pulmonary/Chest: Effort normal and breath sounds normal.  Neurological: She is alert and oriented to person, place, and time.  Skin: Skin is warm and dry.  Psychiatric: She has a normal mood and affect. Her behavior is normal.    BP:  170/92  HR:  57       Assessment:     INR 1.4     Plan:     1.  INR below goal 2-3 2.  Increase Coumadin 5mg  QD except 2.5mg  T/Th 3.  RTC in 2 weeks

## 2013-12-28 ENCOUNTER — Ambulatory Visit (HOSPITAL_COMMUNITY): Payer: Medicare Other | Attending: Cardiovascular Disease | Admitting: Radiology

## 2013-12-28 ENCOUNTER — Ambulatory Visit: Payer: Medicare Other | Admitting: Internal Medicine

## 2013-12-28 DIAGNOSIS — R55 Syncope and collapse: Secondary | ICD-10-CM | POA: Insufficient documentation

## 2013-12-28 DIAGNOSIS — R609 Edema, unspecified: Secondary | ICD-10-CM

## 2013-12-28 NOTE — Progress Notes (Signed)
Echocardiogram performed.  

## 2014-01-09 ENCOUNTER — Encounter: Payer: Self-pay | Admitting: Pharmacotherapy

## 2014-01-09 ENCOUNTER — Ambulatory Visit (INDEPENDENT_AMBULATORY_CARE_PROVIDER_SITE_OTHER): Payer: Medicare Other | Admitting: Pharmacotherapy

## 2014-01-09 VITALS — BP 118/70 | HR 51 | Temp 97.5°F | Resp 10

## 2014-01-09 DIAGNOSIS — I471 Supraventricular tachycardia, unspecified: Secondary | ICD-10-CM

## 2014-01-09 DIAGNOSIS — Z7901 Long term (current) use of anticoagulants: Secondary | ICD-10-CM

## 2014-01-09 LAB — POCT INR: INR: 2.2

## 2014-01-09 NOTE — Progress Notes (Signed)
Subjective:     Patient ID: Jennifer Brennan, female   DOB: 08/15/1923, 80 y.o.   MRN: [id number]  HPI Last INR was low at 1.4 Coumadin was increased to 5mg  QD except 2.5mg  T/Th Denies missed doses. Denies unusual bleeding or bruising. Denies CP, SOB, falls Wears continuous Oxygen. Consistent with vitamin K.   Review of Systems  HENT: Negative for nosebleeds.   Respiratory: Negative for shortness of breath.   Cardiovascular: Negative for chest pain.  Gastrointestinal: Negative for blood in stool and anal bleeding.  Genitourinary: Negative for hematuria.  Hematological: Does not bruise/bleed easily.       Objective:   Physical Exam  Constitutional: She appears well-developed and well-nourished.  HENT:  Right Ear: External ear normal.  Left Ear: External ear normal.  Cardiovascular: Normal rate, regular rhythm and normal heart sounds.   Pulmonary/Chest: Effort normal and breath sounds normal.  Neurological: She is alert.  Skin: Skin is warm and dry.  Psychiatric: She has a normal mood and affect. Her behavior is normal.    BP:  118/70  HR:  51      Assessment:     INR 2.2     Plan:     1.  INR at goal 2-3 2.  Continue Coumadin 5mg  QD except 2.5mg  T/Th 3.  RTC in 1 month

## 2014-01-09 NOTE — Patient Instructions (Signed)
INR 2.2 Continue Coumadin 5mg  daily except 2.5mg  (1/2 tablet) on Tuesdays and Thursdays

## 2014-01-23 ENCOUNTER — Other Ambulatory Visit: Payer: Self-pay | Admitting: *Deleted

## 2014-01-23 DIAGNOSIS — Z7901 Long term (current) use of anticoagulants: Secondary | ICD-10-CM

## 2014-01-23 MED ORDER — WARFARIN SODIUM 5 MG PO TABS: ORAL_TABLET | ORAL | Status: DC

## 2014-01-27 ENCOUNTER — Encounter: Payer: Self-pay | Admitting: Cardiovascular Disease

## 2014-01-27 NOTE — Telephone Encounter (Signed)
This encounter was created in error - please disregard.

## 2014-01-27 NOTE — Telephone Encounter (Signed)
New message     [employer] recommended a medication for her heart.  They said she was already on this medication, but the patient's daughter is not aware of this medication.  She want to ask a nurse about this.

## 2014-01-30 ENCOUNTER — Other Ambulatory Visit: Payer: Self-pay | Admitting: Internal Medicine

## 2014-02-06 ENCOUNTER — Other Ambulatory Visit: Payer: Self-pay

## 2014-02-06 ENCOUNTER — Other Ambulatory Visit: Payer: Medicare Other

## 2014-02-06 DIAGNOSIS — E039 Hypothyroidism, unspecified: Secondary | ICD-10-CM

## 2014-02-06 DIAGNOSIS — I10 Essential (primary) hypertension: Secondary | ICD-10-CM

## 2014-02-06 DIAGNOSIS — R531 Weakness: Secondary | ICD-10-CM

## 2014-02-06 DIAGNOSIS — Z7901 Long term (current) use of anticoagulants: Secondary | ICD-10-CM

## 2014-02-06 MED ORDER — METHADONE HCL 5 MG PO TABS: ORAL_TABLET | ORAL | Status: DC

## 2014-02-06 NOTE — Telephone Encounter (Signed)
Rx given to patient, patient was here for lab work

## 2014-02-07 LAB — CBC WITH DIFFERENTIAL
BASOS ABS: 0 10*3/uL (ref 0.0–0.2)
BASOS: 0 %
EOS ABS: 0.2 10*3/uL (ref 0.0–0.4)
Eos: 3 %
HEMATOCRIT: 39.3 % (ref 34.0–46.6)
Hemoglobin: 12.6 g/dL (ref 11.1–15.9)
Immature Grans (Abs): 0 10*3/uL (ref 0.0–0.1)
Immature Granulocytes: 0 %
LYMPHS ABS: 2.5 10*3/uL (ref 0.7–3.1)
LYMPHS: 42 %
MCH: 25.8 pg — ABNORMAL LOW (ref 26.6–33.0)
MCHC: 32.1 g/dL (ref 31.5–35.7)
MCV: 80 fL (ref 79–97)
Monocytes Absolute: 0.5 10*3/uL (ref 0.1–0.9)
Monocytes: 8 %
NEUTROS ABS: 2.8 10*3/uL (ref 1.4–7.0)
Neutrophils Relative %: 47 %
Platelets: 222 10*3/uL (ref 150–379)
RBC: 4.89 x10E6/uL (ref 3.77–5.28)
RDW: 14.6 % (ref 12.3–15.4)
WBC: 6 10*3/uL (ref 3.4–10.8)

## 2014-02-07 LAB — COMPREHENSIVE METABOLIC PANEL
A/G RATIO: 1.4 (ref 1.1–2.5)
ALT: 12 IU/L (ref 0–32)
AST: 18 IU/L (ref 0–40)
Albumin: 3.8 g/dL (ref 3.5–4.7)
Alkaline Phosphatase: 79 IU/L (ref 39–117)
BUN/Creatinine Ratio: 12 (ref 11–26)
BUN: 13 mg/dL (ref 8–27)
CO2: 30 mmol/L — ABNORMAL HIGH (ref 18–29)
Calcium: 9.4 mg/dL (ref 8.7–10.3)
Chloride: 102 mmol/L (ref 97–108)
Creatinine, Ser: 1.1 mg/dL — ABNORMAL HIGH (ref 0.57–1.00)
GFR calc non Af Amer: 45 mL/min/{1.73_m2} — ABNORMAL LOW (ref >59)
GFR, EST AFRICAN AMERICAN: 52 mL/min/{1.73_m2} — AB (ref >59)
GLUCOSE: 92 mg/dL (ref 65–99)
Globulin, Total: 2.7 g/dL (ref 1.5–4.5)
POTASSIUM: 4.2 mmol/L (ref 3.5–5.2)
Sodium: 144 mmol/L (ref 134–144)
TOTAL PROTEIN: 6.5 g/dL (ref 6.0–8.5)
Total Bilirubin: 0.3 mg/dL (ref 0.0–1.2)

## 2014-02-07 LAB — TSH: TSH: 11.42 u[IU]/mL — AB (ref 0.450–4.500)

## 2014-02-07 LAB — PROTIME-INR
INR: 2.3 — AB (ref 0.8–1.2)
Prothrombin Time: 23.6 s — ABNORMAL HIGH (ref 9.1–12.0)

## 2014-02-08 ENCOUNTER — Ambulatory Visit (INDEPENDENT_AMBULATORY_CARE_PROVIDER_SITE_OTHER): Payer: Medicare Other | Admitting: Internal Medicine

## 2014-02-08 ENCOUNTER — Encounter: Payer: Self-pay | Admitting: Internal Medicine

## 2014-02-08 VITALS — BP 140/82 | HR 62 | Temp 98.1°F

## 2014-02-08 DIAGNOSIS — I10 Essential (primary) hypertension: Secondary | ICD-10-CM

## 2014-02-08 DIAGNOSIS — K2951 Unspecified chronic gastritis with bleeding: Secondary | ICD-10-CM

## 2014-02-08 DIAGNOSIS — Z7901 Long term (current) use of anticoagulants: Secondary | ICD-10-CM

## 2014-02-08 DIAGNOSIS — M79609 Pain in unspecified limb: Secondary | ICD-10-CM

## 2014-02-08 DIAGNOSIS — M79606 Pain in leg, unspecified: Secondary | ICD-10-CM

## 2014-02-08 DIAGNOSIS — F0280 Dementia in other diseases classified elsewhere without behavioral disturbance: Secondary | ICD-10-CM

## 2014-02-08 DIAGNOSIS — L408 Other psoriasis: Secondary | ICD-10-CM

## 2014-02-08 DIAGNOSIS — L409 Psoriasis, unspecified: Secondary | ICD-10-CM

## 2014-02-08 DIAGNOSIS — K2941 Chronic atrophic gastritis with bleeding: Secondary | ICD-10-CM

## 2014-02-08 DIAGNOSIS — E039 Hypothyroidism, unspecified: Secondary | ICD-10-CM

## 2014-02-08 DIAGNOSIS — R21 Rash and other nonspecific skin eruption: Secondary | ICD-10-CM

## 2014-02-08 DIAGNOSIS — G309 Alzheimer's disease, unspecified: Secondary | ICD-10-CM

## 2014-02-08 MED ORDER — LEVOTHYROXINE SODIUM 88 MCG PO TABS: ORAL_TABLET | ORAL | Status: AC

## 2014-02-08 MED ORDER — GABAPENTIN 300 MG PO CAPS: ORAL_CAPSULE | ORAL | Status: DC

## 2014-02-08 MED ORDER — FLUOCINOLONE ACETONIDE 0.01 % OT OIL: TOPICAL_OIL | OTIC | Status: DC

## 2014-02-08 MED ORDER — TRIAMCINOLONE ACETONIDE 0.1 % EX CREA: TOPICAL_CREAM | CUTANEOUS | Status: DC

## 2014-02-08 MED ORDER — MEMANTINE HCL ER 28 MG PO CP24: ORAL_CAPSULE | ORAL | Status: DC

## 2014-02-08 NOTE — Progress Notes (Signed)
Patient ID: Jennifer Brennan, female   DOB: [birth year], 80 y.o.   MRN: [id number]    Location:    [facility]  Place of Service:  OFFICE    Allergies  Allergen Reactions  . Bactrim [Sulfamethoxazole-Trimethoprim] Other (See Comments)    MD stopped due to kidney failure Also causes lips to swell  . Doxycycline Swelling    Causes lips to swell, wheezing  . Furosemide Other (See Comments)    MD stopped due to kidney failure  . Percocet [Oxycodone-Acetaminophen] Other (See Comments)    Causes SEIZURES  . Valsartan Other (See Comments)    MD stopped due to kidney failure  . Adhesive [Tape]   . Penicillins Hives  . Morphine Sulfate Itching and Rash  . Sertraline Hcl Rash    Chief Complaint  Patient presents with  . Medical Management of Chronic Issues    3 month f/u, discuss labs, & [facility] RX Form  . other    LT leg is sore where shte had a spot on it (healed)as well as the RT leg still hurts, Temp on 8/16 was low 93.3 & BP 176/74, short term memory worse-another pill?, and also wants to know if she can get a RX for Fluocinolone Acetonide 0.01% tropical Oil for scalp psoriasis(last given by Dermatologist in 2014.)      HPI:  Rash and nonspecific skin eruption - skin changes on the leg  HYPERTENSION - controlled  HYPOTHYROIDISM - compensated on levothyroxine (SYNTHROID, LEVOTHROID) 88 MCG tablet  Pain of lower extremity, unspecified laterality - improved on gabapentin (NEURONTIN) 300 MG capsule  Long term (current) use of anticoagulants: therapeutic  Alzheimer's disease - unchanged  Psoriasis of scalp - Plan: Fluocinolone Acetonide 0.01 % OIL  Gastrointestinal hemorrhage associated with chronic gastritis - needs follow up blood count    Medications: Patient's Medications  New Prescriptions   No medications on file  Previous Medications   ACETAMINOPHEN (TYLENOL) 500 MG TABLET    Take 1,000 mg by mouth every 6 (six) hours as needed for mild pain, fever or headache. pain   ALBUTEROL (PROVENTIL HFA;VENTOLIN HFA) 108 (90 BASE) MCG/ACT INHALER    Inhale 2 puffs into the lungs every 6 (six) hours as needed. For shortness of breath.   BRIMONIDINE (ALPHAGAN) 0.15 % OPHTHALMIC SOLUTION    Place 1 drop into both eyes 2 (two) times daily.   CHOLECALCIFEROL 2000 UNITS TABS    Take 1 tablet by mouth every morning.   CRANBERRY-VITAMIN C-VITAMIN E PO    Take by mouth. 450 mg Cranberry/ 250 mg Vitamin C   DILTIAZEM 2 % GEL    Apply 1 application topically 2 (two) times daily. Apply to anal fissures. Place a small amount just inside and outside of anus as needed   DORZOLAMIDE-TIMOLOL (COSOPT) 22.3-6.8 MG/ML OPHTHALMIC SOLUTION    Place 1 drop into both eyes 2 (two) times daily.     FLUTICASONE-SALMETEROL (ADVAIR) 250-50 MCG/DOSE AEPB    Inhale one puff every 12 hours to help breathing   GABAPENTIN (NEURONTIN) 300 MG CAPSULE    take 2 capsules by mouth at bedtime for pain   IPRATROPIUM-ALBUTEROL (COMBIVENT RESPIMAT) 20-100 MCG/ACT AERS RESPIMAT    Inhale 2 puffs into the lungs every 6 (six) hours. Take 2 puffs 4 times daily to help with breathing as needed.   LATANOPROST (XALATAN) 0.005 % OPHTHALMIC SOLUTION    Place 1 drop into both eyes at bedtime.     LETROZOLE (FEMARA) 2.5 MG TABLET  Take 2.5 mg by mouth at bedtime.   LEVETIRACETAM (KEPPRA) 500 MG TABLET    Take 500 mg by mouth 2 (two) times daily.   LEVOTHYROXINE (SYNTHROID, LEVOTHROID) 75 MCG TABLET    Take 1 tablet (75 mcg total) by mouth daily before breakfast.   LOSARTAN (COZAAR) 50 MG TABLET    Take one tablet by mouth once daily to control blood pressure   MEMANTINE HCL ER 28 MG CP24    Take 28 mg by mouth daily. One daily to help preserve memory   METHADONE (DOLOPHINE) 5 MG TABLET    Take 1.25 by mouth at bedtime. Take one every 12 hours if needed for pain   MULTIPLE VITAMIN (MULTIVITAMIN) TABLET    Centrum Silver:  Take 1 tablet daily   OMEPRAZOLE (PRILOSEC) 20 MG CAPSULE    Take 20 mg by mouth every other day. Take 1  tablet every other day to reduce stomach acids.   PILOCARPINE (PILOCAR) 1 % OPHTHALMIC SOLUTION    Place 1 drop into both eyes 3 (three) times daily.    POLYETHYLENE GLYCOL POWDER (GLYCOLAX) POWDER    17 gram in 4-8 oz fluid daily. Adjust as needed to achieve 1-2 soft stools daily.   PROBIOTIC PRODUCT (ALIGN) 4 MG CAPS    Take by mouth. Take 1 capsule daily to help irritable colon. Only while on antibiotics.   WARFARIN (COUMADIN) 5 MG TABLET    Take 1 tablet (5mg ) daily except 1/2 tablet (2.5mg ) on Tuesdays and Thursdays  Modified Medications   No medications on file  Discontinued Medications   CRANBERRY-VITAMIN C-VITAMIN E 4200-20-3 MG-MG-UNIT CAPS    Take by mouth. Take one tablet once a day     Review of Systems  Constitutional: Positive for fatigue. Negative for fever, diaphoresis, activity change, appetite change and unexpected weight change.  HENT: Positive for hearing loss. Negative for congestion and ear pain.   Eyes: Positive for visual disturbance.       Nearly blind bilaterally.  Respiratory: Negative for apnea, cough, choking, chest tightness, shortness of breath and wheezing.   Cardiovascular: Positive for leg swelling. Negative for chest pain and palpitations.  Gastrointestinal: Negative for abdominal pain and abdominal distention.  Endocrine: Negative for cold intolerance, polydipsia, polyphagia and polyuria.  Genitourinary: Positive for frequency. Negative for decreased urine volume, difficulty urinating and pelvic pain.       Urinary incontinence.  Musculoskeletal: Positive for arthralgias, gait problem and myalgias. Negative for joint swelling, neck pain and neck stiffness.       History of back and leg pains.  Skin:       Single red lesion on the anterior left upper leg.  Allergic/Immunologic: Negative.   Neurological: Positive for weakness. Negative for dizziness, tremors, seizures, syncope, facial asymmetry, speech difficulty, light-headedness, numbness and headaches.   Hematological: Negative.   Psychiatric/Behavioral: Positive for confusion, sleep disturbance, decreased concentration and agitation. Negative for suicidal ideas, hallucinations and behavioral problems. The patient is not nervous/anxious and is not hyperactive.     Filed Vitals:   02/08/14 1523  BP: 140/82  Pulse: 62  Temp: 98.1 F (36.7 C)  TempSrc: Oral  SpO2: 94%   There is no weight on file to calculate BMI.  Physical Exam  Constitutional: No distress.  HENT:  Head: Normocephalic and atraumatic.  Diminished hearing bilaterally. Exposure of root of the lower left canine.  Eyes: Conjunctivae and EOM are normal. Left eye exhibits no discharge.   Nearly blind bilaterally  Neck: Normal  range of motion. Neck supple. No JVD present. No tracheal deviation present. No thyromegaly present.  Cardiovascular: Normal rate, regular rhythm and normal heart sounds.  Exam reveals no gallop and no friction rub.   No murmur heard. Pulmonary/Chest: Effort normal and breath sounds normal. No respiratory distress. She has no wheezes. She has no rales.  Abdominal: Bowel sounds are normal. She exhibits no distension and no mass. There is no tenderness.  Musculoskeletal: Normal range of motion. She exhibits edema and tenderness.  Tender at both knees. Tender in the right groin area.  Lymphadenopathy:    She has no cervical adenopathy.  Neurological: She is alert. No cranial nerve deficit. Coordination normal.  Memory deficit.  Skin: Skin is warm and dry. No rash noted. She is not diaphoretic. No erythema. No pallor.  2 mm red spot on the anterior left thigh that appears to have been a small blister of folliculitis. It is healing. There is mild discoloration of the skin around this lesion that was likely an inflammatory response that is now subsiding.  Psychiatric:  Significant dementia. Calm at our office today.. Improvement episodes of agitation at home.     Labs reviewed: Appointment on  02/06/2014  Component Date Value Ref Range Status  . TSH 02/06/2014 11.420* 0.450 - 4.500 uIU/mL Final  . Glucose 02/06/2014 92  65 - 99 mg/dL Final  . BUN 02/06/2014 13  8 - 27 mg/dL Final  . Creatinine, Ser 02/06/2014 1.10* 0.57 - 1.00 mg/dL Final  . GFR calc non Af Amer 02/06/2014 45* >59 mL/min/1.73 Final  . GFR calc Af Amer 02/06/2014 52* >59 mL/min/1.73 Final  . BUN/Creatinine Ratio 02/06/2014 12  11 - 26 Final  . Sodium 02/06/2014 144  134 - 144 mmol/L Final  . Potassium 02/06/2014 4.2  3.5 - 5.2 mmol/L Final  . Chloride 02/06/2014 102  97 - 108 mmol/L Final  . CO2 02/06/2014 30* 18 - 29 mmol/L Final  . Calcium 02/06/2014 9.4  8.7 - 10.3 mg/dL Final  . Total Protein 02/06/2014 6.5  6.0 - 8.5 g/dL Final  . Albumin 02/06/2014 3.8  3.5 - 4.7 g/dL Final  . Globulin, Total 02/06/2014 2.7  1.5 - 4.5 g/dL Final  . Albumin/Globulin Ratio 02/06/2014 1.4  1.1 - 2.5 Final  . Total Bilirubin 02/06/2014 0.3  0.0 - 1.2 mg/dL Final  . Alkaline Phosphatase 02/06/2014 79  39 - 117 IU/L Final  . AST 02/06/2014 18  0 - 40 IU/L Final  . ALT 02/06/2014 12  0 - 32 IU/L Final  . WBC 02/06/2014 6.0  3.4 - 10.8 x10E3/uL Final  . RBC 02/06/2014 4.89  3.77 - 5.28 x10E6/uL Final  . Hemoglobin 02/06/2014 12.6  11.1 - 15.9 g/dL Final  . HCT 02/06/2014 39.3  34.0 - 46.6 % Final  . MCV 02/06/2014 80  79 - 97 fL Final  . MCH 02/06/2014 25.8* 26.6 - 33.0 pg Final  . MCHC 02/06/2014 32.1  31.5 - 35.7 g/dL Final  . RDW 02/06/2014 14.6  12.3 - 15.4 % Final  . Platelets 02/06/2014 222  150 - 379 x10E3/uL Final  . Neutrophils Relative % 02/06/2014 47   Final  . Lymphs 02/06/2014 42   Final  . Monocytes 02/06/2014 8   Final  . Eos 02/06/2014 3   Final  . Basos 02/06/2014 0   Final  . Neutrophils Absolute 02/06/2014 2.8  1.4 - 7.0 x10E3/uL Final  . Lymphocytes Absolute 02/06/2014 2.5  0.7 - 3.1 x10E3/uL  Final  . Monocytes Absolute 02/06/2014 0.5  0.1 - 0.9 x10E3/uL Final  . Eosinophils Absolute 02/06/2014 0.2   0.0 - 0.4 x10E3/uL Final  . Basophils Absolute 02/06/2014 0.0  0.0 - 0.2 x10E3/uL Final  . Immature Granulocytes 02/06/2014 0   Final  . Immature Grans (Abs) 02/06/2014 0.0  0.0 - 0.1 x10E3/uL Final  . INR 02/06/2014 2.3* 0.8 - 1.2 Final   Comment: Reference interval is for non-anticoagulated patients.                          Suggested INR therapeutic range for Vitamin K                          antagonist therapy:                             Standard Dose (moderate intensity                                            therapeutic range):       2.0 - 3.0                             Higher intensity therapeutic range       2.5 - 3.5  . Prothrombin Time 02/06/2014 23.6* 9.1 - 12.0 sec Final  Office Visit on 01/09/2014  Component Date Value Ref Range Status  . INR 01/09/2014 2.2   Final  Office Visit on 12/26/2013  Component Date Value Ref Range Status  . INR 12/26/2013 1.4   Final  Office Visit on 11/28/2013  Component Date Value Ref Range Status  . INR 11/28/2013 1.8   Final    Assessment/Plan  1. Rash and nonspecific skin eruption - triamcinolone cream (KENALOG) 0.1 %; Apply sparingly up to twice daily to rash until it is resolved  Dispense: 30 g; Refill: 4  2. HYPERTENSION - Comprehensive metabolic panel; Future  3. HYPOTHYROIDISM - levothyroxine (SYNTHROID, LEVOTHROID) 88 MCG tablet; One daily for thyroid supplement  Dispense: 90 tablet; Refill: 3 - TSH; Future  4. Pain of lower extremity, unspecified laterality - gabapentin (NEURONTIN) 300 MG capsule; 2 at bedtime to help pains  Dispense: 60 capsule; Refill: 5  5. Long term (current) use of anticoagulants continue current warfarin  6. Alzheimer's disease - Memantine HCl ER 28 MG CP24; One daily to help preserve memory  Dispense: 30 capsule; Refill: 5  7. Psoriasis of scalp - Fluocinolone Acetonide 0.01 % OIL; Use on scalp for psoriasis  Dispense: 20 mL; Refill: 5  8. Gastrointestinal hemorrhage associated with  chronic gastritis - CBC With differential/Platelet; Future

## 2014-02-09 ENCOUNTER — Other Ambulatory Visit: Payer: Self-pay | Admitting: *Deleted

## 2014-02-09 DIAGNOSIS — C50919 Malignant neoplasm of unspecified site of unspecified female breast: Secondary | ICD-10-CM

## 2014-02-09 MED ORDER — LETROZOLE 2.5 MG PO TABS: 2.5000 mg | ORAL_TABLET | Freq: Every day | ORAL | Status: DC

## 2014-02-10 ENCOUNTER — Telehealth: Payer: Self-pay | Admitting: *Deleted

## 2014-02-10 NOTE — Telephone Encounter (Signed)
rersent

## 2014-02-10 NOTE — Telephone Encounter (Signed)
We need to get a clean catch urine specimen for UA and culture.

## 2014-02-10 NOTE — Telephone Encounter (Signed)
Patient was in to see you this past week and you asked them if patient had an odor to her urine and she didn't at the time, but daughter notices an odor now. Patient is still not feeling well and in the bed. Please Advise.

## 2014-02-13 ENCOUNTER — Ambulatory Visit: Payer: Medicare Other | Admitting: Pharmacotherapy

## 2014-02-13 NOTE — Telephone Encounter (Signed)
Patient Caregiver Notified.

## 2014-02-15 ENCOUNTER — Other Ambulatory Visit: Payer: Self-pay | Admitting: *Deleted

## 2014-02-15 ENCOUNTER — Ambulatory Visit: Payer: Medicare Other

## 2014-02-15 DIAGNOSIS — R8290 Unspecified abnormal findings in urine: Secondary | ICD-10-CM

## 2014-02-17 LAB — URINE CULTURE

## 2014-02-17 LAB — URINALYSIS

## 2014-03-13 ENCOUNTER — Other Ambulatory Visit: Payer: Self-pay | Admitting: Internal Medicine

## 2014-03-13 ENCOUNTER — Ambulatory Visit (INDEPENDENT_AMBULATORY_CARE_PROVIDER_SITE_OTHER): Payer: Medicare Other | Admitting: Pharmacotherapy

## 2014-03-13 ENCOUNTER — Encounter: Payer: Self-pay | Admitting: Pharmacotherapy

## 2014-03-13 VITALS — BP 138/82 | HR 60 | Temp 97.3°F | Resp 18

## 2014-03-13 DIAGNOSIS — K5900 Constipation, unspecified: Secondary | ICD-10-CM

## 2014-03-13 DIAGNOSIS — Z7901 Long term (current) use of anticoagulants: Secondary | ICD-10-CM

## 2014-03-13 LAB — POCT INR: INR: 2.6

## 2014-03-13 MED ORDER — POLYETHYLENE GLYCOL 3350 17 GM/SCOOP PO POWD: ORAL | Status: AC

## 2014-03-13 NOTE — Patient Instructions (Signed)
INR 2.6 Continue Coumadin 5mg  (1 tablet) daily except 2.5mg  (1/2 tablet) on Tuesdays / Thursdays

## 2014-03-13 NOTE — Progress Notes (Signed)
   Subjective:    Patient ID: Jennifer Brennan, female    DOB: August 08, 1923, 80 y.o.   MRN: [id number]  HPI Last INR was OK at 2.4 Current Coumadin dose is 5mg  QD except 2.5mg  T/Th Denies missed doses. Denies unusual bleeding or bruising noted. Denies CP, SOB, falls Consistent with vitamin K intake    Review of Systems  HENT: Negative for nosebleeds.   Respiratory: Negative for shortness of breath.   Cardiovascular: Negative for chest pain.  Gastrointestinal: Negative for blood in stool and anal bleeding.  Genitourinary: Negative for hematuria.  Hematological: Does not bruise/bleed easily.       Objective:   Physical Exam  Vitals reviewed. Constitutional: She is oriented to person, place, and time. She appears well-developed and well-nourished.  HENT:  Right Ear: External ear normal.  Left Ear: External ear normal.  Eyes:  blind  Cardiovascular: Normal rate, regular rhythm and normal heart sounds.   Pulmonary/Chest: Effort normal and breath sounds normal.  Musculoskeletal:  In a wheelchair  Neurological: She is alert and oriented to person, place, and time.  Skin: Skin is warm and dry.  Psychiatric: She has a normal mood and affect. Her behavior is normal.    INR 2.6  BP:  138/82  HR:  60         Assessment & Plan:  1.  INR at goal 2-3 2.  Continue Coumadin 5mg  QD except 2.5mg  M/F 3.  RTC in 1 month

## 2014-03-14 ENCOUNTER — Telehealth: Payer: Self-pay | Admitting: *Deleted

## 2014-03-14 DIAGNOSIS — N390 Urinary tract infection, site not specified: Secondary | ICD-10-CM

## 2014-03-14 NOTE — Telephone Encounter (Signed)
Patient daughter will pick up cup and collect urine and bring it back for test.

## 2014-03-14 NOTE — Telephone Encounter (Signed)
Get UA and culture

## 2014-03-14 NOTE — Telephone Encounter (Signed)
Patient daughter, Blanch Media called and stated that she is not sure what to do with her mother. She gets hot then cold then hot then cold and it goes on and on. She took her temperature and it was 94.4. Not sure what is going on and wants your opinion. Please Advise.

## 2014-03-15 ENCOUNTER — Other Ambulatory Visit: Payer: Medicare Other

## 2014-03-15 DIAGNOSIS — N390 Urinary tract infection, site not specified: Secondary | ICD-10-CM

## 2014-03-16 LAB — URINALYSIS
Bilirubin, UA: NEGATIVE
Glucose, UA: NEGATIVE
KETONES UA: NEGATIVE
LEUKOCYTES UA: NEGATIVE
NITRITE UA: NEGATIVE
Protein, UA: NEGATIVE
RBC, UA: NEGATIVE
SPEC GRAV UA: 1.014 (ref 1.005–1.030)
Urobilinogen, Ur: 0.2 mg/dL (ref 0.0–1.9)
pH, UA: 6 (ref 5.0–7.5)

## 2014-03-17 LAB — URINE CULTURE

## 2014-03-23 ENCOUNTER — Other Ambulatory Visit: Payer: Medicare Other

## 2014-03-23 DIAGNOSIS — N390 Urinary tract infection, site not specified: Secondary | ICD-10-CM

## 2014-03-24 LAB — URINALYSIS
Bilirubin, UA: NEGATIVE
Glucose, UA: NEGATIVE
Ketones, UA: NEGATIVE
LEUKOCYTES UA: NEGATIVE
NITRITE UA: NEGATIVE
Protein, UA: NEGATIVE
RBC UA: NEGATIVE
SPEC GRAV UA: 1.008 (ref 1.005–1.030)
UUROB: 0.2 mg/dL (ref 0.0–1.9)
pH, UA: 6 (ref 5.0–7.5)

## 2014-03-25 LAB — URINE CULTURE

## 2014-03-27 ENCOUNTER — Telehealth: Payer: Self-pay | Admitting: *Deleted

## 2014-03-27 NOTE — Telephone Encounter (Signed)
Daughter called and wanted to know what the Urine Culture showed because her mother is having chills, shakes and signs of confusions. Please Advise.  Pharmacy [facility]

## 2014-03-27 NOTE — Telephone Encounter (Signed)
Less than 25,00 colonies. Usually, this does not meet criteria for infection, but because she is having symptoms, we will treat .  Rx: Cipro 500 mg (Disp: 14) Take one twice daily for infection.

## 2014-03-28 MED ORDER — CIPROFLOXACIN HCL 500 MG PO TABS: ORAL_TABLET | ORAL | Status: DC

## 2014-03-28 NOTE — Telephone Encounter (Signed)
Patient Daughter, Blanch Media, Notified and faxed Rx into pharmacy

## 2014-04-03 ENCOUNTER — Ambulatory Visit (INDEPENDENT_AMBULATORY_CARE_PROVIDER_SITE_OTHER): Payer: Medicare Other | Admitting: Pharmacotherapy

## 2014-04-03 ENCOUNTER — Encounter: Payer: Self-pay | Admitting: Pharmacotherapy

## 2014-04-03 VITALS — BP 122/74 | HR 86 | Resp 10 | Wt 163.0 lb

## 2014-04-03 DIAGNOSIS — Z7901 Long term (current) use of anticoagulants: Secondary | ICD-10-CM

## 2014-04-03 LAB — POCT INR: INR: 2.9

## 2014-04-03 NOTE — Patient Instructions (Signed)
INR 2.9 Continue Coumadin 5mg  daily except 2.5mg  on Tuesdays and Thursdays

## 2014-04-03 NOTE — Progress Notes (Signed)
   Subjective:    Patient ID: Jennifer Brennan, female    DOB: 1924/03/01, 80 y.o.   MRN: [id number]  HPI Last INR was OK at 2.6 Current coumadin dose is 5mg  QD except 2.5mg  T/Th. She has been on Cipro and finishes therapy tonight. Denies CP. Has chronic SOB.  Wears oxygen. Denies falls. Consistent with vitamin K intake.   Review of Systems  Constitutional: Positive for fatigue.  HENT: Negative for nosebleeds.   Eyes: Positive for visual disturbance.  Respiratory: Positive for shortness of breath.   Cardiovascular: Negative for chest pain.  Gastrointestinal: Negative for blood in stool and anal bleeding.  Genitourinary: Positive for dysuria. Negative for hematuria.  Hematological: Does not bruise/bleed easily.       Objective:   Physical Exam  Vitals reviewed. Constitutional: She is oriented to person, place, and time. She appears well-developed and well-nourished.  HENT:  Right Ear: External ear normal.  Left Ear: External ear normal.  Eyes:  She is blind  Cardiovascular: Normal rate, regular rhythm and normal heart sounds.   Pulmonary/Chest: Effort normal and breath sounds normal.  Musculoskeletal:  wheelchair  Neurological: She is alert and oriented to person, place, and time.  Skin: Skin is warm and dry.  Psychiatric: She has a normal mood and affect. Her behavior is normal.    BP:  122/74   Ht:  86   Wt:  163lb  INR:  2.9        Assessment & Plan:  1.  INR at goal 2-3. 2.  Continue Coumadin 5mg  QD except 2.5mg  T/Th 3.  RTC in 1 month

## 2014-04-09 ENCOUNTER — Other Ambulatory Visit: Payer: Self-pay | Admitting: Internal Medicine

## 2014-04-13 ENCOUNTER — Telehealth: Payer: Self-pay | Admitting: Cardiovascular Disease

## 2014-04-13 DIAGNOSIS — I4729 Other ventricular tachycardia: Secondary | ICD-10-CM

## 2014-04-13 DIAGNOSIS — I472 Ventricular tachycardia: Secondary | ICD-10-CM

## 2014-04-13 NOTE — Telephone Encounter (Signed)
I attempted to reach [place] and no answer at this time.

## 2014-04-13 NOTE — Telephone Encounter (Signed)
New message      Daughter believes her mother needs a pacemaker.  Patient has episodes of high heart rate (250 by machine) then it comes down.  Patients mother got a pacemaker in her late 37.  Please call daughter.

## 2014-04-17 ENCOUNTER — Ambulatory Visit: Payer: Medicare Other | Admitting: Pharmacotherapy

## 2014-04-17 NOTE — Telephone Encounter (Signed)
I spoke with Blanch Media and made her aware of Dr Antionette Char recommendation. Order placed for event monitor.

## 2014-04-17 NOTE — Telephone Encounter (Signed)
I spoke with Blanch Media the pt's daughter and she said the pt continues to have episodes where she slumps over and does not respond.  When this occurs the pt's pulse is very high (250 by machine).  The frequency of these events have increased and are also related to when the pt is not wearing her oxygen.  The last episode was yesterday and per Blanch Media this occurs a couple of times per week. I made Blanch Media aware that we are limited in using medications because the pt's pulse runs in the 82s.  Blanch Media said the pt's pulse has also been in the 40s.  I will forward this message to Dr Burt Knack for review and further recommendations.

## 2014-04-17 NOTE — Telephone Encounter (Signed)
Recommend a 21 day event monitor for further evaluation. Symptoms sound highly consistent with arrhythmia.

## 2014-04-18 ENCOUNTER — Telehealth: Payer: Self-pay | Admitting: *Deleted

## 2014-04-18 MED ORDER — HYDROCORTISONE ACETATE 25 MG RE SUPP: 25.0000 mg | Freq: Two times a day (BID) | RECTAL | Status: AC

## 2014-04-18 NOTE — Telephone Encounter (Signed)
Ok to refill 

## 2014-04-18 NOTE — Telephone Encounter (Signed)
Patient daughter, Blanch Media called and stated that patient is having a Hemorrhoid flare and the hospital had given her Hydrocortisone (Anusol HC) 25mg  Suppositories in the past to insert one rectally twice daily. She would like a refill on these. Please Advise.

## 2014-04-18 NOTE — Telephone Encounter (Signed)
Faxed Rx to pharmacy and daughter Notified.

## 2014-04-20 ENCOUNTER — Encounter (INDEPENDENT_AMBULATORY_CARE_PROVIDER_SITE_OTHER): Payer: Medicare Other

## 2014-04-20 ENCOUNTER — Encounter: Payer: Self-pay | Admitting: *Deleted

## 2014-04-20 DIAGNOSIS — I472 Ventricular tachycardia: Secondary | ICD-10-CM

## 2014-04-20 DIAGNOSIS — I4729 Other ventricular tachycardia: Secondary | ICD-10-CM

## 2014-04-20 NOTE — Progress Notes (Signed)
Patient ID: Jennifer Brennan, female   DOB: 1924-03-30, 80 y.o.   MRN: [id number] [employer] 30 day cardiac event monitor applied to patient.

## 2014-04-21 ENCOUNTER — Other Ambulatory Visit: Payer: Self-pay | Admitting: Internal Medicine

## 2014-04-26 ENCOUNTER — Encounter: Payer: Self-pay | Admitting: Internal Medicine

## 2014-04-26 ENCOUNTER — Ambulatory Visit: Payer: Self-pay | Admitting: Internal Medicine

## 2014-04-26 ENCOUNTER — Ambulatory Visit (INDEPENDENT_AMBULATORY_CARE_PROVIDER_SITE_OTHER): Payer: Medicare Other | Admitting: Internal Medicine

## 2014-04-26 VITALS — BP 140/70 | HR 58 | Temp 97.7°F | Ht 63.0 in | Wt 171.4 lb

## 2014-04-26 DIAGNOSIS — S31831A Laceration without foreign body of anus, initial encounter: Secondary | ICD-10-CM

## 2014-04-26 DIAGNOSIS — K649 Unspecified hemorrhoids: Secondary | ICD-10-CM

## 2014-04-26 MED ORDER — DECUBI-VITE PO CAPS: 1.0000 | ORAL_CAPSULE | Freq: Every day | ORAL | Status: DC

## 2014-04-26 NOTE — Progress Notes (Signed)
Patient ID: ROBECCA FULGHAM, female   DOB: [birth year], 80 y.o.   MRN: [id number]    Chief Complaint  Patient presents with  . Acute Visit    Sores on backside and hemorrhoid.   Allergies  Allergen Reactions  . Bactrim [Sulfamethoxazole-Trimethoprim] Other (See Comments)    MD stopped due to kidney failure Also causes lips to swell  . Doxycycline Swelling    Causes lips to swell, wheezing  . Furosemide Other (See Comments)    MD stopped due to kidney failure  . Percocet [Oxycodone-Acetaminophen] Other (See Comments)    Causes SEIZURES  . Valsartan Other (See Comments)    MD stopped due to kidney failure  . Adhesive [Tape]   . Penicillins Hives  . Morphine Sulfate Itching and Rash  . Sertraline Hcl Rash   HPI 80 y/o female patient is here with her husband and daughter for acute visit. She is under total care being blind and her daughter helps with her skin care. She has UI and is on diapers. Her daughter noticed a skin tear 3 weeks back and applied neosporin to it and mentions it to have healed. But recently she has noticed 2 open areas in her bottom that she wants to be looked at. Patient denies any sore in her bottom area. She has rectal itching intermittently but no other concerns.  ROS Some blood noted in the diaper No frank blood in stool No nausea or vomiting No abdominal pain No dyspnea  Past Medical History  Diagnosis Date  . Hypertension   . Hypothyroidism   . Seizure disorder   . Severe stage glaucoma     legally blind  . Depression with anxiety   . OAB (overactive bladder)   . DVT 05/2007    RLE following R THR - chronic coumadin, chronic RLE pain  . GLAUCOMA   . OSTEOPENIA   . Retinal ischemia   . Sciatica of right side     chronic RLE pain, multifactorial - MRI T/L spine 02/2011  . VITAMIN D DEFICIENCY dx 08/2008  . Venous ulcer of right lower extremity with varicose veins   . Dementia   . Vulvar abscess   . Unspecified hypothyroidism   . Unspecified  vitamin D deficiency   . Unspecified urinary incontinence   . Renal disorder   . Varicose veins of lower extremity   . Varicose veins of lower extremities with ulcer   . Unspecified disorder of kidney and ureter   . Stevens-Johnson syndrome   . Sebaceous cyst   . Disorder of bone and cartilage, unspecified   . Contusion of lower leg   . Depression   . Seizures   . Osteoporosis, unspecified 05/12/2013  . Acute asthmatic bronchitis 09/18/2013  . DVT, lower extremity   . ADENOCARCINOMA, LEFT BREAST 04/11/2010    s/p L mastectomy, on femara x 54yr  . OSTEOARTHRITIS, HANDS, BILATERAL   . Arthritis     "back; hands; toes" (09/26/2013)  . Chronic back pain     "upper and lower" (09/26/2013)  . Anxiety   . Blindness of both eyes     "from glaucoma". Also has bilateral occipital lobe infarcts.  . Cerebral atrophy   . Cerebrovascular disease   . Cerebral infarct     left parietal and bilateral occipital lobes   Current Outpatient Prescriptions on File Prior to Visit  Medication Sig Dispense Refill  . acetaminophen (TYLENOL) 500 MG tablet Take 1,000 mg by mouth every 6 (six) hours  as needed for mild pain, fever or headache. pain    . albuterol (PROVENTIL HFA;VENTOLIN HFA) 108 (90 BASE) MCG/ACT inhaler Inhale 2 puffs into the lungs every 6 (six) hours as needed. For shortness of breath.    . brimonidine (ALPHAGAN) 0.15 % ophthalmic solution Place 1 drop into both eyes 2 (two) times daily.    . Cholecalciferol 2000 UNITS TABS Take 1 tablet by mouth every morning.    . ciprofloxacin (CIPRO) 500 MG tablet Take one tablet by mouth twice daily for 7 days for infection 14 tablet 0  . CRANBERRY-VITAMIN C-VITAMIN E PO Take by mouth. 450 mg Cranberry/ 250 mg Vitamin C    . diltiazem 2 % GEL Apply 1 application topically 2 (two) times daily. Apply to anal fissures. Place a small amount just inside and outside of anus as needed    . dorzolamide-timolol (COSOPT) 22.3-6.8 MG/ML ophthalmic solution Place 1  drop into both eyes 2 (two) times daily.      . Fluocinolone Acetonide 0.01 % OIL Use on scalp for psoriasis 20 mL 5  . Fluticasone-Salmeterol (ADVAIR) 250-50 MCG/DOSE AEPB Inhale one puff every 12 hours to help breathing 60 each 2  . gabapentin (NEURONTIN) 300 MG capsule 2 at bedtime to help pains 60 capsule 5  . hydrocortisone (ANUSOL-HC) 25 MG suppository Place 1 suppository (25 mg total) rectally 2 (two) times daily. 12 suppository 0  . Ipratropium-Albuterol (COMBIVENT RESPIMAT) 20-100 MCG/ACT AERS respimat Inhale 2 puffs into the lungs every 6 (six) hours. Take 2 puffs 4 times daily to help with breathing as needed.    Marland Kitchen letrozole (FEMARA) 2.5 MG tablet Take 1 tablet (2.5 mg total) by mouth at bedtime. 90 tablet 4  . levETIRAcetam (KEPPRA) 500 MG tablet Take 500 mg by mouth 2 (two) times daily.    Marland Kitchen levothyroxine (SYNTHROID, LEVOTHROID) 88 MCG tablet One daily for thyroid supplement 90 tablet 3  . losartan (COZAAR) 50 MG tablet Take one tablet by mouth once daily to control blood pressure 30 tablet 5  . losartan (COZAAR) 50 MG tablet take 1 tablet by mouth once daily for blood pressure 30 tablet 5  . Memantine HCl ER 28 MG CP24 One daily to help preserve memory 30 capsule 5  . methadone (DOLOPHINE) 5 MG tablet Take 1.25 by mouth at bedtime. Take one every 12 hours if needed for pain 30 tablet 0  . Multiple Vitamin (MULTIVITAMIN) tablet Centrum Silver:  Take 1 tablet daily    . omeprazole (PRILOSEC) 20 MG capsule Take 20 mg by mouth every other day. Take 1 tablet every other day to reduce stomach acids.    . pilocarpine (PILOCAR) 1 % ophthalmic solution Place 1 drop into both eyes 3 (three) times daily.     Vladimir Faster Glycol-Propyl Glycol (SYSTANE) 0.4-0.3 % GEL Apply 1 drop to eye 2 (two) times daily as needed.    . polyethylene glycol powder (GLYCOLAX) powder 17 gram in 4-8 oz fluid daily. Adjust as needed to achieve 1-2 soft stools daily. 255 g 5  . PROAIR HFA 108 (90 BASE) MCG/ACT inhaler  inhale 2 puffs by mouth every 6 hours if needed for wheezing 8.5 g 2  . Probiotic Product (ALIGN) 4 MG CAPS Take by mouth. Take 1 capsule daily to help irritable colon. Only while on antibiotics.    Marland Kitchen triamcinolone cream (KENALOG) 0.1 % Apply sparingly up to twice daily to rash until it is resolved 30 g 4  . warfarin (COUMADIN) 5 MG  tablet Take 1 tablet (5mg ) daily except 1/2 tablet (2.5mg ) on Tuesdays and Thursdays 30 tablet 5   No current facility-administered medications on file prior to visit.    Physical exam BP 140/70 mmHg  Pulse 58  Temp(Src) 97.7 F (36.5 C) (Oral)  Ht 5\' 3"  (1.6 m)  Wt 171 lb 6.4 oz (77.747 kg)  BMI 30.37 kg/m2  SpO2 93%  General- elderly female in no acute distress Cardiovascular- normal s1,s2, no murmurs Respiratory- bilateral clear to auscultation, no wheeze, no rhonchi, no crackles Abdomen- bowel sounds present, soft, non tender Rectal exam- 2 skin tear in the anal area, no external hemorrhoids, no anal skin tags, no signs of infection, moistur present, patient incontinent Musculoskeletal- able to move all 4 extremities, needs assistance with transfers, on [facility] Neurological- no focal deficit, has dementia  Assessment/plan  1. Tear of anal skin, initial encounter Skin care- keep skin area dry, advised daughter to change diaper frequently, use baby wipes and try to keep skin area moisture free. Reassess if worsens  2. Hemorrhoids, unspecified hemorrhoid type No hemorrhoid externally on exam. advisedto continue using anusol suppository for now

## 2014-05-01 ENCOUNTER — Encounter: Payer: Self-pay | Admitting: Pharmacotherapy

## 2014-05-01 ENCOUNTER — Ambulatory Visit (INDEPENDENT_AMBULATORY_CARE_PROVIDER_SITE_OTHER): Payer: Medicare Other | Admitting: Pharmacotherapy

## 2014-05-01 VITALS — BP 140/70 | HR 56 | Temp 98.2°F | Ht 63.0 in

## 2014-05-01 DIAGNOSIS — Z7901 Long term (current) use of anticoagulants: Secondary | ICD-10-CM

## 2014-05-01 LAB — POCT INR: INR: 2.2

## 2014-05-01 NOTE — Progress Notes (Signed)
   Subjective:    Patient ID: Jennifer Brennan, female    DOB: [birth year], 80 y.o.   MRN: [id number]  HPI Last INR on 04/03/14 was OK at 2.9 Current Coumadin dose is 5mg  QD except 2.5mg  T/Th Denies missed doses. Denies unusual bleeding or bruising. Denies CP, falls Consistent with vitamin K   Review of Systems  HENT: Negative for nosebleeds.   Respiratory: Positive for shortness of breath.   Cardiovascular: Negative for chest pain.  Gastrointestinal: Negative for blood in stool and anal bleeding.  Genitourinary: Negative for hematuria.  Hematological: Does not bruise/bleed easily.       Objective:   Physical Exam  Constitutional: She is oriented to person, place, and time. She appears well-developed.  HENT:  Right Ear: External ear normal.  Left Ear: External ear normal.  Cardiovascular: Normal rate, regular rhythm and normal heart sounds.   Pulmonary/Chest: Effort normal and breath sounds normal.  Wearing oxygen  Neurological: She is alert and oriented to person, place, and time.  Skin: Skin is warm and dry.  Psychiatric: She has a normal mood and affect. Her behavior is normal.  Vitals reviewed.  BP:  140/70  HR:  56   INR 2.2        Assessment & Plan:  1.  INR at goal 2-3. 2.  Continue Coumadin 5mg  QD except 2.5mg  T/Th 3.  RTC 1 month

## 2014-05-01 NOTE — Patient Instructions (Signed)
INR 2.2 Continue Coumadin 5mg  daily except 2.5mg  (1/2 tablet) on Tuesdays and Thursdays

## 2014-05-08 ENCOUNTER — Telehealth: Payer: Self-pay | Admitting: *Deleted

## 2014-05-08 NOTE — Telephone Encounter (Signed)
This is not likely to be a significant health issue.

## 2014-05-08 NOTE — Telephone Encounter (Signed)
Patient daughter, Jennifer Brennan called and stated that her mother over the weekend was getting hot and cold. Temperature was 94 and they gave her tea and blankets and it went up to 96. Should they be concerned with this? Please Advise.

## 2014-05-08 NOTE — Telephone Encounter (Signed)
Patient daughter Blanch Media Notified and agreed.

## 2014-05-17 ENCOUNTER — Ambulatory Visit (INDEPENDENT_AMBULATORY_CARE_PROVIDER_SITE_OTHER): Payer: Medicare Other | Admitting: Internal Medicine

## 2014-05-17 ENCOUNTER — Encounter: Payer: Self-pay | Admitting: Internal Medicine

## 2014-05-17 VITALS — BP 140/60 | HR 60 | Temp 97.0°F | Ht 63.0 in

## 2014-05-17 DIAGNOSIS — R569 Unspecified convulsions: Secondary | ICD-10-CM

## 2014-05-17 DIAGNOSIS — F0280 Dementia in other diseases classified elsewhere without behavioral disturbance: Secondary | ICD-10-CM

## 2014-05-17 DIAGNOSIS — I10 Essential (primary) hypertension: Secondary | ICD-10-CM

## 2014-05-17 DIAGNOSIS — G309 Alzheimer's disease, unspecified: Secondary | ICD-10-CM

## 2014-05-17 DIAGNOSIS — M461 Sacroiliitis, not elsewhere classified: Secondary | ICD-10-CM

## 2014-05-17 DIAGNOSIS — L210 Seborrhea capitis: Secondary | ICD-10-CM

## 2014-05-17 DIAGNOSIS — J219 Acute bronchiolitis, unspecified: Secondary | ICD-10-CM

## 2014-05-17 MED ORDER — METHADONE HCL 5 MG PO TABS: ORAL_TABLET | ORAL | Status: DC

## 2014-05-17 MED ORDER — LEVETIRACETAM 500 MG PO TABS: ORAL_TABLET | ORAL | Status: DC

## 2014-05-17 NOTE — Patient Instructions (Addendum)
Stop losartan. Check BP about 3 days weekly. Call if > 150 on top or >95 on the lower pressure.

## 2014-05-17 NOTE — Progress Notes (Signed)
Patient ID: Jennifer Brennan, female   DOB: [birth year], 80 y.o.   MRN: [id number]    Facility  [facility]    Place of Service:   OFFICE   Allergies  Allergen Reactions  . Bactrim [Sulfamethoxazole-Trimethoprim] Other (See Comments)    MD stopped due to kidney failure Also causes lips to swell  . Doxycycline Swelling    Causes lips to swell, wheezing  . Furosemide Other (See Comments)    MD stopped due to kidney failure  . Percocet [Oxycodone-Acetaminophen] Other (See Comments)    Causes SEIZURES  . Valsartan Other (See Comments)    MD stopped due to kidney failure  . Adhesive [Tape]   . Penicillins Hives  . Morphine Sulfate Itching and Rash  . Sertraline Hcl Rash    Chief Complaint  Patient presents with  . Follow-up    3 month Follow up    HPI:  Sacroiliitis -  Exquisitely tender in the right sacroiliac joint for couple of weeks. There is discomfort when her daughter attempts to assist her to move. Once up on her feet, she can still walk. Sitting still seems to relieve the discomfort. Methadone has been helpful.  Essential hypertension:   controlled  Alzheimer's disease:  unchanged  Seborrhea capitis:  Improved with the use of the steroid oils  Convulsions, unspecified convulsion type - last seizure was in early 2014. She seems to have benefited from levETIRAcetam (KEPPRA) 500 MG tablet  Bronchiolitis:  Breathing is somewhat better. She remains on oxygen. She would like to stop the Advair.   UTI: Pseudomonas scratch that Proteus mirabilis was found in the urine in October 2000 15. Although the colony count was low at less than 25,000 , patient had some dysuria and we elected to treat this with Cipro. Since treatment, the dysuria has resolved.   Palpitations: Dr. Burt Knack has ordered a 21 day Holter monitor. She will be finishing this in the next week.  Medications: Patient's Medications  New Prescriptions   No medications on file  Previous Medications   ACETAMINOPHEN  (TYLENOL) 500 MG TABLET    Take 1,000 mg by mouth every 6 (six) hours as needed for mild pain, fever or headache. pain   BRIMONIDINE (ALPHAGAN) 0.15 % OPHTHALMIC SOLUTION    Place 1 drop into both eyes 2 (two) times daily.   CHOLECALCIFEROL 2000 UNITS TABS    Take 1 tablet by mouth every morning.   CRANBERRY-VITAMIN C-VITAMIN E PO    Take by mouth. 450 mg Cranberry/ 250 mg Vitamin C   DILTIAZEM 2 % GEL    Apply 1 application topically 2 (two) times daily. Apply to anal fissures. Place a small amount just inside and outside of anus as needed   DIPHENHYDRAMINE (BENADRYL) 25 MG TABLET    Take 25 mg by mouth every 6 (six) hours as needed. 1/2 as needed   DORZOLAMIDE-TIMOLOL (COSOPT) 22.3-6.8 MG/ML OPHTHALMIC SOLUTION    Place 1 drop into both eyes 2 (two) times daily.     FLUOCINOLONE ACETONIDE 0.01 % OIL    Use on scalp for psoriasis   FLUTICASONE-SALMETEROL (ADVAIR) 250-50 MCG/DOSE AEPB    Inhale one puff every 12 hours to help breathing   GABAPENTIN (NEURONTIN) 300 MG CAPSULE    2 at bedtime to help pains   HYDROCORTISONE (ANUSOL-HC) 25 MG SUPPOSITORY    Place 1 suppository (25 mg total) rectally 2 (two) times daily.   IPRATROPIUM-ALBUTEROL (COMBIVENT RESPIMAT) 20-100 MCG/ACT AERS RESPIMAT    Inhale 2  puffs into the lungs every 6 (six) hours. Take 2 puffs 4 times daily to help with breathing as needed.   LETROZOLE (FEMARA) 2.5 MG TABLET    Take 1 tablet (2.5 mg total) by mouth at bedtime.   LEVETIRACETAM (KEPPRA) 500 MG TABLET    Take 500 mg by mouth 2 (two) times daily.   LEVOTHYROXINE (SYNTHROID, LEVOTHROID) 88 MCG TABLET    One daily for thyroid supplement   LOSARTAN (COZAAR) 50 MG TABLET    Take one tablet by mouth once daily to control blood pressure   MEMANTINE HCL ER 28 MG CP24    One daily to help preserve memory   METHADONE (DOLOPHINE) 5 MG TABLET    Take 1.25 by mouth at bedtime. Take one every 12 hours if needed for pain   MULTIPLE VITAMIN (MULTIVITAMIN) TABLET    Centrum Silver:  Take 1  tablet daily   MULTIPLE VITAMINS-MINERALS (DECUBI-VITE) CAPS    Take 1 capsule by mouth daily.   NEOMYCIN-BACITRACIN-POLYMYXIN (NEOSPORIN) OINTMENT    Apply 1 application topically 3 (three) times daily. apply to eye   OMEPRAZOLE (PRILOSEC) 20 MG CAPSULE    Take 20 mg by mouth every other day. Take 1 tablet every other day to reduce stomach acids.   PILOCARPINE (PILOCAR) 1 % OPHTHALMIC SOLUTION    Place 1 drop into both eyes 3 (three) times daily.    POLYETHYL GLYCOL-PROPYL GLYCOL (SYSTANE) 0.4-0.3 % GEL    Apply 1 drop to eye 2 (two) times daily as needed.   POLYETHYLENE GLYCOL POWDER (GLYCOLAX) POWDER    17 gram in 4-8 oz fluid daily. Adjust as needed to achieve 1-2 soft stools daily.   PROAIR HFA 108 (90 BASE) MCG/ACT INHALER    inhale 2 puffs by mouth every 6 hours if needed for wheezing   PROBIOTIC PRODUCT (ALIGN) 4 MG CAPS    Take by mouth. Take 1 capsule daily to help irritable colon. Only while on antibiotics.   TRIAMCINOLONE CREAM (KENALOG) 0.1 %    Apply sparingly up to twice daily to rash until it is resolved   WARFARIN (COUMADIN) 5 MG TABLET    Take 1 tablet (5mg ) daily except 1/2 tablet (2.5mg ) on Tuesdays and Thursdays  Modified Medications   No medications on file  Discontinued Medications   CIPROFLOXACIN (CIPRO) 500 MG TABLET    Take one tablet by mouth twice daily for 7 days for infection   LOSARTAN (COZAAR) 50 MG TABLET    take 1 tablet by mouth once daily for blood pressure     Review of Systems  Constitutional: Positive for fatigue. Negative for fever, diaphoresis, activity change, appetite change and unexpected weight change.  HENT: Positive for hearing loss. Negative for congestion and ear pain.   Eyes: Positive for visual disturbance.       Nearly blind bilaterally.  Respiratory: Negative for apnea, cough, choking, chest tightness, shortness of breath and wheezing.   Cardiovascular: Positive for leg swelling. Negative for chest pain and palpitations.    Gastrointestinal: Negative for abdominal pain and abdominal distention.  Endocrine: Negative for cold intolerance, polydipsia, polyphagia and polyuria.  Genitourinary: Positive for frequency. Negative for decreased urine volume, difficulty urinating and pelvic pain.       Urinary incontinence.  Musculoskeletal: Positive for myalgias, arthralgias and gait problem. Negative for joint swelling, neck pain and neck stiffness.       History of back and leg pains.  Skin:       Single red  lesion on the anterior left upper leg.  Allergic/Immunologic: Negative.   Neurological: Positive for weakness. Negative for dizziness, tremors, seizures, syncope, facial asymmetry, speech difficulty, light-headedness, numbness and headaches.  Hematological: Negative.   Psychiatric/Behavioral: Positive for confusion, sleep disturbance, decreased concentration and agitation. Negative for suicidal ideas, hallucinations and behavioral problems. The patient is not nervous/anxious and is not hyperactive.     Filed Vitals:   05/17/14 1342  BP: 140/60  Pulse: 60  Temp: 97 F (36.1 C)  TempSrc: Oral  Height: 5\' 3"  (1.6 m)  SpO2: 93%   Body mass index is 0.00 kg/(m^2).  Physical Exam  Constitutional: No distress.  HENT:  Head: Normocephalic and atraumatic.  Diminished hearing bilaterally. Exposure of root of the lower left canine.  Eyes: Conjunctivae and EOM are normal. Left eye exhibits no discharge.   Nearly blind bilaterally  Neck: Normal range of motion. Neck supple. No JVD present. No tracheal deviation present. No thyromegaly present.  Cardiovascular: Normal rate, regular rhythm and normal heart sounds.  Exam reveals no gallop and no friction rub.   No murmur heard. Pulmonary/Chest: Effort normal and breath sounds normal. No respiratory distress. She has no wheezes. She has no rales.  Abdominal: Bowel sounds are normal. She exhibits no distension and no mass. There is no tenderness.  Musculoskeletal:  Normal range of motion. She exhibits edema and tenderness.  Tender at both knees. Tender in the right groin area.  Lymphadenopathy:    She has no cervical adenopathy.  Neurological: She is alert. No cranial nerve deficit. Coordination normal.  Memory deficit.  Skin: Skin is warm and dry. No rash noted. She is not diaphoretic. No erythema. No pallor.  2 mm red spot on the anterior left thigh that appears to have been a small blister of folliculitis. It is healing. There is mild discoloration of the skin around this lesion that was likely an inflammatory response that is now subsiding.  Psychiatric:  Significant dementia. Calm at our office today.. Improvement episodes of agitation at home.     Labs reviewed: Office Visit on 05/01/2014  Component Date Value Ref Range Status  . INR 05/01/2014 2.2   Final  Office Visit on 04/03/2014  Component Date Value Ref Range Status  . INR 04/03/2014 2.9   Final  Lab on 03/23/2014  Component Date Value Ref Range Status  . Specific Gravity, UA 03/23/2014 1.008  1.005 - 1.030 Final  . pH, UA 03/23/2014 6.0  5.0 - 7.5 Final  . Color, UA 03/23/2014 Yellow  Yellow Final  . Appearance Ur 03/23/2014 Clear  Clear Final  . Leukocytes, UA 03/23/2014 Negative  Negative Final  . Protein, UA 03/23/2014 Negative  Negative/Trace Final  . Glucose, UA 03/23/2014 Negative  Negative Final  . Ketones, UA 03/23/2014 Negative  Negative Final  . RBC, UA 03/23/2014 Negative  Negative Final  . Bilirubin, UA 03/23/2014 Negative  Negative Final  . Urobilinogen, Ur 03/23/2014 0.2  0.0 - 1.9 mg/dL Final  . Nitrite, UA 03/23/2014 Negative  Negative Final  . Urine Culture, Routine 03/23/2014 Final report*  Final  . Result 1 03/23/2014 Proteus mirabilis*  Final   10,000-25,000 colony forming units per mL  . ANTIMICROBIAL SUSCEPTIBILITY 03/23/2014 Comment   Final   Comment:       ** S = Susceptible; I = Intermediate; R = Resistant **  P = Positive; N = Negative                                      MICS are expressed in micrograms per mL                             Antibiotic                 RSLT#1    RSLT#2    RSLT#3    RSLT#4                          Amoxicillin/Clavulanic Acid    S                          Ampicillin                     S                          Cefepime                       S                          Ceftriaxone                    S                          Cefuroxime                     S                          Cephalothin                    S                          Ciprofloxacin                  S                          Ertapenem                      S                          Gentamicin                     S                          Levofloxacin                   S                          Nitrofurantoin  R                          Piperacillin                   S                          Tetracycline                   R                          Tobramycin                     S                          Trimethoprim/Sulfa             S  Appointment on 03/15/2014  Component Date Value Ref Range Status  . Specific Gravity, UA 03/15/2014 1.014  1.005 - 1.030 Final  . pH, UA 03/15/2014 6.0  5.0 - 7.5 Final  . Color, UA 03/15/2014 Yellow  Yellow Final  . Appearance Ur 03/15/2014 Clear  Clear Final  . Leukocytes, UA 03/15/2014 Negative  Negative Final  . Protein, UA 03/15/2014 Negative  Negative/Trace Final  . Glucose, UA 03/15/2014 Negative  Negative Final  . Ketones, UA 03/15/2014 Negative  Negative Final  . RBC, UA 03/15/2014 Negative  Negative Final  . Bilirubin, UA 03/15/2014 Negative  Negative Final  . Urobilinogen, Ur 03/15/2014 0.2  0.0 - 1.9 mg/dL Final  . Nitrite, UA 03/15/2014 Negative  Negative Final  . Urine Culture, Routine 03/15/2014 Final report   Final  . Result 1 03/15/2014 Comment   Final   Comment: Greater than 2 organisms recovered, none predominant.  Please submit                          another sample if clinically indicated.                          Greater than 100,000 colony forming units per mL  Office Visit on 03/13/2014  Component Date Value Ref Range Status  . INR 03/13/2014 2.6   Final  Appointment on 02/15/2014  Component Date Value Ref Range Status  . Urine Culture, Routine 02/15/2014 Final report   Final  . Result 1 02/15/2014 Comment   Final   Comment: Greater than 2 organisms recovered, none predominant. Please submit                          another sample if clinically indicated.                          50,000-100,000 colony forming units per mL  . Specific Gravity, UA 02/15/2014 CANCELED   Final-Edited   Comment: Quantity was not sufficient for analysis.                                                    Result canceled by the ancillary  .  pH, UA 02/15/2014 CANCELED   Final-Edited   Comment: Test not performed                                                    Result canceled by the ancillary  . Protein, UA 02/15/2014 CANCELED   Final-Edited   Comment: Test not performed                                                    Result canceled by the ancillary  . Glucose, UA 02/15/2014 CANCELED   Final-Edited   Comment: Test not performed                                                    Result canceled by the ancillary  . Ketones, UA 02/15/2014 CANCELED   Final-Edited   Comment: Test not performed                                                    Result canceled by the ancillary     Assessment/Plan  1. Sacroiliitis  lengthy educational explanation and reprinted articles was provided - methadone (DOLOPHINE) 5 MG tablet; Take 1.25 by mouth at bedtime. Take one every 12 hours if needed for pain  Dispense: 30 tablet; Refill: 0  2. Essential hypertension  controlled  discontinue losartan  3. Alzheimer's disease  unchanged  4. Seborrhea capitis  stable  5. Convulsions, unspecified convulsion type  no  convulsions since early 2014 - levETIRAcetam (KEPPRA) 500 MG tablet; One twice daily to prevent seizures  Dispense: 60 tablet; Refill: 5  6. Bronchiolitis  discontinue Advair

## 2014-05-22 ENCOUNTER — Other Ambulatory Visit: Payer: Self-pay | Admitting: *Deleted

## 2014-05-22 DIAGNOSIS — C50912 Malignant neoplasm of unspecified site of left female breast: Secondary | ICD-10-CM | POA: Insufficient documentation

## 2014-05-22 DIAGNOSIS — C50919 Malignant neoplasm of unspecified site of unspecified female breast: Secondary | ICD-10-CM

## 2014-05-22 NOTE — Progress Notes (Signed)
ID: Jennifer Brennan   DOB: 08-Feb-1924  MR#: [id number]  OHK#:[id number]  PCP: Estill Dooms, MD GYN:  SU:  OTHER MD:  CHIEF COMPLAINT: Estrogen receptor positive breast cancer  CURRENT TREATMENT: Letrozole   HISTORY OF PRESENT ILLNESS: From the original intake note:  The patient had some pain in Jennifer Brennan left breast associated with a palpable abnormality. Jennifer Brennan brought this to Dr. Gwynne Edinger attention and he set Jennifer Brennan up for bilateral diagnostic mammography and left ultrasonography which was performed October 11th, 2011 at [facility]. Dr. Owens Shark found a spiculated mass in the upper inner quadrant of the left breast which was palpable and associated with skin dimpling. Ultrasound shows a hypoechoic mass with acoustic shadowing, spiculated, measuring 2.0 cm. The left axilla showed no enlarged lymph nodes. Biopsy was performed the same day and showed (invasive ductal carcinoma, Grade 2, with some scirrhous changes. The tumor was estrogen 97% positive, progesterone 64% positive with a proliferative marker of 18% and no amplification of Jennifer Brennan-2 by CISH with a ratio of 0.94.  With this information, the patient was referred to Dr. Ninfa Linden, and Jennifer Brennan was felt not to be an MRI candidate because of Jennifer Brennan multiple medical problems as listed below. The case was presented at the Multidisciplinary Breast Cancer Conference on October 19th and the consensus there was that the patient was a good candidate for breast conserving surgery followed by hormones.  Accordingly, the patient is status post left mastectomy and sentinel lymph node sampling in November of 2011 for a T2 N0, grade 3 invasive ductal carcinoma. Tumor was strongly ER and PR positive, Jennifer Brennan-2/neu negative, with a borderline MIP-1. Jennifer Brennan began letrozole in May of 2012, 2.5 mg daily with good tolerance.   Jennifer Brennan subsequent history is as detailed below    INTERVAL HISTORY: Jennifer Brennan returns today accompanied by Jennifer Brennan husband Mallie Mussel and one of Jennifer Brennan daughters forfollowup of  [name]'s left breast cancer. They tell me that Jennifer Brennan just completed a Holter monitor for Jennifer Brennan arrhythmias but that they do not have those results. Meanwhile Jennifer Brennan continues on letrozole. Jennifer Brennan reports no side effects from that medication other than mild hot flashes.  REVIEW OF SYSTEMS: Begin complains of some night sweats, chills, severe fatigue, and insomnia. Jennifer Brennan is now fully blind. Jennifer Brennan gets short of breath sometimes even at rest, sometimes when walking. Jennifer Brennan is wearing oxygen today. Jennifer Brennan sleeps in a hospital bed. Jennifer Brennan has some back and joint pain which is not more persistent or intense than before. Jennifer Brennan does not walk well. Jennifer Brennan feels forgetful and depressed. According to the daughter Jennifer Brennan has hallucinations at times. A detailed review of systems today was otherwise stable   PAST MEDICAL HISTORY: Past Medical History  Diagnosis Date  . Hypertension   . Hypothyroidism   . Seizure disorder   . Severe stage glaucoma     legally blind  . Depression with anxiety   . OAB (overactive bladder)   . DVT 05/2007    RLE following R THR - chronic coumadin, chronic RLE pain  . GLAUCOMA   . OSTEOPENIA   . Retinal ischemia   . Sciatica of right side     chronic RLE pain, multifactorial - MRI T/L spine 02/2011  . VITAMIN D DEFICIENCY dx 08/2008  . Venous ulcer of right lower extremity with varicose veins   . Dementia   . Vulvar abscess   . Unspecified hypothyroidism   . Unspecified vitamin D deficiency   . Unspecified urinary incontinence   . Renal disorder   .  Varicose veins of lower extremity   . Varicose veins of lower extremities with ulcer   . Unspecified disorder of kidney and ureter   . Stevens-Johnson syndrome   . Sebaceous cyst   . Disorder of bone and cartilage, unspecified   . Contusion of lower leg   . Depression   . Seizures   . Osteoporosis, unspecified 05/12/2013  . Acute asthmatic bronchitis 09/18/2013  . DVT, lower extremity   . ADENOCARCINOMA, LEFT BREAST 04/11/2010    s/p L mastectomy,  on femara x 52yr . OSTEOARTHRITIS, HANDS, BILATERAL   . Arthritis     "back; hands; toes" (09/26/2013)  . Chronic back pain     "upper and lower" (09/26/2013)  . Anxiety   . Blindness of both eyes     "from glaucoma". Also has bilateral occipital lobe infarcts.  . Cerebral atrophy   . Cerebrovascular disease   . Cerebral infarct     left parietal and bilateral occipital lobes    PAST SURGICAL HISTORY: Past Surgical History  Procedure Laterality Date  . Total abdominal hysterectomy    . Total hip arthroplasty Right 10/2006  . Refractive surgery Bilateral   . Tonsillectomy    . Breast biopsy Left 03/2010  . Mastectomy Left 2011    FAMILY HISTORY Family History  Problem Relation Age of Onset  . Ovarian cancer Mother   . Cancer Mother   . Deep vein thrombosis Mother   . Heart disease Mother   . Arthritis Other     grandparents  . Lung cancer Other   . Heart disease Other     parent  . Cancer Father     BRAIN TUMOR  . Cancer Brother   . Hyperlipidemia Brother   . Hypertension Brother   . Stroke Brother   . Arthritis Brother   . Sickle cell anemia Daughter   . Heart disease Daughter   . Sickle cell anemia Daughter     GYNECOLOGIC HISTORY: Jennifer Brennan is GX P4. First pregnancy to term at age 80 Jennifer Brennan went through the change of life at the age of 54and did take hormones for several years but Jennifer Brennan cannot recall how many.   SOCIAL HISTORY: The patient's husband of 472years, HYasira Engelson used to work as a cLandin a children's program but now "I am Jennifer Brennan caretaker." He has some hearing loss. Daughter RVirgil [place] in [place] is retired. Daughter ARetail bankeralso lives in [place] Jennifer Brennan is disabled secondary to rheumatoid arthritis. Daughter [place]Blenda [place]ide[place]rooml[place] in [place] Brennan is disabled secondary to sickle cell disease. 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   HEALTH  MAINTENANCE: History  Substance Use Topics  . Smoking status: Former Smoker -- 1.00 packs/day for 45 years    Types: Cigarettes    Quit date: 10/16/1979  . Smokeless tobacco: Never Used  . Alcohol Use: Yes     Comment: 09/26/2013 "drank some years ago"     Colonoscopy:  PAP:  Bone density:  Lipid panel:  Allergies  Allergen Reactions  . Bactrim [Sulfamethoxazole-Trimethoprim] Other (See Comments)    MD stopped due to kidney failure Also causes lips to swell  . Doxycycline Swelling    Causes lips to swell, wheezing  . Furosemide Other (See Comments)    MD stopped due to kidney failure  . Percocet [Oxycodone-Acetaminophen] Other (See Comments)  Causes SEIZURES  . Valsartan Other (See Comments)    MD stopped due to kidney failure  . Adhesive [Tape]   . Penicillins Hives  . Morphine Sulfate Itching and Rash  . Sertraline Hcl Rash    Current Outpatient Prescriptions  Medication Sig Dispense Refill  . acetaminophen (TYLENOL) 500 MG tablet Take 1,000 mg by mouth every 6 (six) hours as needed for mild pain, fever or headache. pain    . brimonidine (ALPHAGAN) 0.15 % ophthalmic solution Place 1 drop into both eyes 2 (two) times daily.    . Cholecalciferol 2000 UNITS TABS Take 1 tablet by mouth every morning.    Marland Kitchen CRANBERRY-VITAMIN C-VITAMIN E PO Take by mouth. 450 mg Cranberry/ 250 mg Vitamin C    . diltiazem 2 % GEL Apply 1 application topically 2 (two) times daily. Apply to anal fissures. Place a small amount just inside and outside of anus as needed    . diphenhydrAMINE (BENADRYL) 25 MG tablet Take 25 mg by mouth every 6 (six) hours as needed. 1/2 as needed    . dorzolamide-timolol (COSOPT) 22.3-6.8 MG/ML ophthalmic solution Place 1 drop into both eyes 2 (two) times daily.      . Fluocinolone Acetonide 0.01 % OIL Use on scalp for psoriasis 20 mL 5  . Fluticasone-Salmeterol (ADVAIR) 250-50 MCG/DOSE AEPB Inhale one puff every 12 hours to help breathing 60 each 2  . gabapentin  (NEURONTIN) 300 MG capsule 2 at bedtime to help pains 60 capsule 5  . hydrocortisone (ANUSOL-HC) 25 MG suppository Place 1 suppository (25 mg total) rectally 2 (two) times daily. 12 suppository 0  . Ipratropium-Albuterol (COMBIVENT RESPIMAT) 20-100 MCG/ACT AERS respimat Inhale 2 puffs into the lungs every 6 (six) hours. Take 2 puffs 4 times daily to help with breathing as needed.    Marland Kitchen letrozole (FEMARA) 2.5 MG tablet Take 1 tablet (2.5 mg total) by mouth at bedtime. 90 tablet 4  . levETIRAcetam (KEPPRA) 500 MG tablet One twice daily to prevent seizures 60 tablet 5  . levothyroxine (SYNTHROID, LEVOTHROID) 88 MCG tablet One daily for thyroid supplement 90 tablet 3  . Memantine HCl ER 28 MG CP24 One daily to help preserve memory 30 capsule 5  . methadone (DOLOPHINE) 5 MG tablet Take 1.25 by mouth at bedtime. Take one every 12 hours if needed for pain 30 tablet 0  . Multiple Vitamin (MULTIVITAMIN) tablet Centrum Silver:  Take 1 tablet daily    . Multiple Vitamins-Minerals (DECUBI-VITE) CAPS Take 1 capsule by mouth daily. 30 capsule 0  . neomycin-bacitracin-polymyxin (NEOSPORIN) ointment Apply 1 application topically 3 (three) times daily. apply to eye    . omeprazole (PRILOSEC) 20 MG capsule Take 20 mg by mouth every other day. Take 1 tablet every other day to reduce stomach acids.    . pilocarpine (PILOCAR) 1 % ophthalmic solution Place 1 drop into both eyes 3 (three) times daily.     Vladimir Faster Glycol-Propyl Glycol (SYSTANE) 0.4-0.3 % GEL Apply 1 drop to eye 2 (two) times daily as needed.    . polyethylene glycol powder (GLYCOLAX) powder 17 gram in 4-8 oz fluid daily. Adjust as needed to achieve 1-2 soft stools daily. 255 g 5  . PROAIR HFA 108 (90 BASE) MCG/ACT inhaler inhale 2 puffs by mouth every 6 hours if needed for wheezing 8.5 g 2  . Probiotic Product (ALIGN) 4 MG CAPS Take by mouth. Take 1 capsule daily to help irritable colon. Only while on antibiotics.    Marland Kitchen  triamcinolone cream (KENALOG) 0.1  % Apply sparingly up to twice daily to rash until it is resolved 30 g 4  . warfarin (COUMADIN) 5 MG tablet Take 1 tablet (42m) daily except 1/2 tablet (2.541m on Tuesdays and Thursdays 30 tablet 5   No current facility-administered medications for this visit.    OBJECTIVE: Elderly African American female examined in a wheelchair, oxygen in place Filed Vitals:   05/23/14 1603  BP: 165/61  Pulse: 58  Temp: 97.5 F (36.4 C)  Resp: 18     Body mass index is 30.3 kg/(m^2).    ECOG FS: 2 Filed Weights   05/23/14 1603  Weight: 171 lb (77.565 kg)   Oropharynx clear, slightly dry No cervical or supraclavicular adenopathy Lungs no rales or rhonchi Heart regular rate and rhythm Abd nontender, positive bowel sounds MSK minimal bilateral ankle edema Neuro: Nonfocal, well-oriented, with pleasant affect Breasts: Right breast is unremarkable. Patient is status post left mastectomy. There is no evidence of local recurrence. The left axilla is benign   LAB RESULTS: Lab Results  Component Value Date   WBC 6.6 05/23/2014   NEUTROABS 2.8 05/23/2014   HGB 12.7 05/23/2014   HCT 39.1 05/23/2014   MCV 81.8 05/23/2014   PLT 201 05/23/2014      Chemistry      Component Value Date/Time   NA 145 05/23/2014 1534   NA 144 02/06/2014 1309   NA 141 09/25/2013 0135   K 3.9 05/23/2014 1534   K 4.2 02/06/2014 1309   CL 102 02/06/2014 1309   CL 106 11/12/2012 1357   CO2 30* 05/23/2014 1534   CO2 30* 02/06/2014 1309   BUN 13.0 05/23/2014 1534   BUN 13 02/06/2014 1309   BUN 18 09/25/2013 0135   CREATININE 1.1 05/23/2014 1534   CREATININE 1.10* 02/06/2014 1309   CREATININE 1.14* 01/20/2011 1516      Component Value Date/Time   CALCIUM 9.6 05/23/2014 1534   CALCIUM 9.4 02/06/2014 1309   ALKPHOS 79 05/23/2014 1534   ALKPHOS 79 02/06/2014 1309   AST 19 05/23/2014 1534   AST 18 02/06/2014 1309   ALT 15 05/23/2014 1534   ALT 12 02/06/2014 1309   BILITOT 0.27 05/23/2014 1534   BILITOT 0.3  02/06/2014 1309       Lab Results  Component Value Date   LABCA2 29 04/27/2012     STUDIES: No results found.   ASSESSMENT: 8881.o. BrVisteon Corporationoman  (1)  status post left mastectomy and sentinel lymph node sampling November 2011 for a T2 N0, grade 3 invasive ductal carcinoma which was strongly estrogen and progesterone-receptor positive, Jennifer Brennan-2-negative, with a borderline MIB-1.   (2)  Jennifer Brennan started letrozole May 2012 and is tolerating it without unusual side effects.   (a)  [facility]  showed osteoporosis with a T score of -3.0.   PLAN: ViSianaas multiple comorbidities which are more life threatening to Jennifer Brennan than Jennifer Brennan breast cancer. At this point there is no evidence of active cancer and Jennifer Brennan was pleased to hear that. The plan is to continue letrozole, which Jennifer Brennan is tolerating well. Jennifer Brennan is going to return to see [place] in [place]. They know to call for any problem that may develop before then may be related to Jennifer Brennan history of breast cancer.  , C    05/23/2014

## 2014-05-23 ENCOUNTER — Other Ambulatory Visit: Payer: Self-pay | Admitting: *Deleted

## 2014-05-23 ENCOUNTER — Telehealth: Payer: Self-pay | Admitting: Oncology

## 2014-05-23 ENCOUNTER — Ambulatory Visit (HOSPITAL_BASED_OUTPATIENT_CLINIC_OR_DEPARTMENT_OTHER): Payer: Medicare Other | Admitting: Oncology

## 2014-05-23 ENCOUNTER — Other Ambulatory Visit (HOSPITAL_BASED_OUTPATIENT_CLINIC_OR_DEPARTMENT_OTHER): Payer: Medicare Other

## 2014-05-23 VITALS — BP 165/61 | HR 58 | Temp 97.5°F | Resp 18 | Ht 63.0 in | Wt 171.0 lb

## 2014-05-23 DIAGNOSIS — N318 Other neuromuscular dysfunction of bladder: Secondary | ICD-10-CM

## 2014-05-23 DIAGNOSIS — N3941 Urge incontinence: Secondary | ICD-10-CM

## 2014-05-23 DIAGNOSIS — Z170 Estrogen receptor positive status [ER+]: Secondary | ICD-10-CM

## 2014-05-23 DIAGNOSIS — C50919 Malignant neoplasm of unspecified site of unspecified female breast: Secondary | ICD-10-CM

## 2014-05-23 DIAGNOSIS — C50912 Malignant neoplasm of unspecified site of left female breast: Secondary | ICD-10-CM

## 2014-05-23 LAB — COMPREHENSIVE METABOLIC PANEL (CC13)
ALBUMIN: 3.2 g/dL — AB (ref 3.5–5.0)
ALK PHOS: 79 U/L (ref 40–150)
ALT: 15 U/L (ref 0–55)
AST: 19 U/L (ref 5–34)
Anion Gap: 9 mEq/L (ref 3–11)
BILIRUBIN TOTAL: 0.27 mg/dL (ref 0.20–1.20)
BUN: 13 mg/dL (ref 7.0–26.0)
CO2: 30 mEq/L — ABNORMAL HIGH (ref 22–29)
Calcium: 9.6 mg/dL (ref 8.4–10.4)
Chloride: 106 mEq/L (ref 98–109)
Creatinine: 1.1 mg/dL (ref 0.6–1.1)
GLUCOSE: 101 mg/dL (ref 70–140)
POTASSIUM: 3.9 meq/L (ref 3.5–5.1)
Sodium: 145 mEq/L (ref 136–145)
Total Protein: 6.8 g/dL (ref 6.4–8.3)

## 2014-05-23 LAB — CBC WITH DIFFERENTIAL/PLATELET
BASO%: 0.3 % (ref 0.0–2.0)
BASOS ABS: 0 10*3/uL (ref 0.0–0.1)
EOS ABS: 0.2 10*3/uL (ref 0.0–0.5)
EOS%: 2.7 % (ref 0.0–7.0)
HCT: 39.1 % (ref 34.8–46.6)
HEMOGLOBIN: 12.7 g/dL (ref 11.6–15.9)
LYMPH%: 43.7 % (ref 14.0–49.7)
MCH: 26.6 pg (ref 25.1–34.0)
MCHC: 32.5 g/dL (ref 31.5–36.0)
MCV: 81.8 fL (ref 79.5–101.0)
MONO#: 0.7 10*3/uL (ref 0.1–0.9)
MONO%: 10.7 % (ref 0.0–14.0)
NEUT%: 42.6 % (ref 38.4–76.8)
NEUTROS ABS: 2.8 10*3/uL (ref 1.5–6.5)
PLATELETS: 201 10*3/uL (ref 145–400)
RBC: 4.78 10*6/uL (ref 3.70–5.45)
RDW: 14.6 % — ABNORMAL HIGH (ref 11.2–14.5)
WBC: 6.6 10*3/uL (ref 3.9–10.3)
lymph#: 2.9 10*3/uL (ref 0.9–3.3)

## 2014-05-23 LAB — URINALYSIS, MICROSCOPIC - CHCC
BILIRUBIN (URINE): NEGATIVE
BLOOD: NEGATIVE
GLUCOSE UR CHCC: NEGATIVE mg/dL
Ketones: NEGATIVE mg/dL
NITRITE: NEGATIVE
Protein: <30 mg/dL
Specific Gravity, Urine: 1.015 (ref 1.003–1.035)
Urobilinogen, UR: 0.2 mg/dL (ref 0.2–1)
pH: 5 (ref 4.6–8.0)

## 2014-05-23 MED ORDER — LETROZOLE 2.5 MG PO TABS: 2.5000 mg | ORAL_TABLET | Freq: Every day | ORAL | Status: DC

## 2014-05-23 NOTE — Telephone Encounter (Signed)
per pof to sch pt appt-gave pt copy of sch °

## 2014-05-24 LAB — URINE CULTURE

## 2014-05-24 NOTE — Addendum Note (Signed)
Addended by: Laureen Abrahams on: 05/24/2014 06:34 PM   Modules accepted: Medications

## 2014-05-25 ENCOUNTER — Telehealth: Payer: Self-pay | Admitting: *Deleted

## 2014-05-25 NOTE — Telephone Encounter (Signed)
Patient daughter, Blanch Media called and stated that patient was feeling bad and she took her BP and it was 113/54. Daughter gave her some salty chips and laid her down and elevated her feet and the BP came up to 120/58. She just wants to know is she doing the right thing and should she be concerned? Please Advise.

## 2014-05-26 NOTE — Telephone Encounter (Signed)
Blanch Media, Caregiver Notified.

## 2014-05-26 NOTE — Telephone Encounter (Signed)
She is doing the right thing. I do not have additional suggestions at this time.

## 2014-06-05 ENCOUNTER — Ambulatory Visit (INDEPENDENT_AMBULATORY_CARE_PROVIDER_SITE_OTHER): Payer: Medicare Other | Admitting: Pharmacotherapy

## 2014-06-05 ENCOUNTER — Encounter: Payer: Self-pay | Admitting: Pharmacotherapy

## 2014-06-05 VITALS — BP 142/88 | HR 63 | Temp 98.4°F | Wt 151.0 lb

## 2014-06-05 DIAGNOSIS — Z7901 Long term (current) use of anticoagulants: Secondary | ICD-10-CM

## 2014-06-05 LAB — POCT INR: INR: 4.2

## 2014-06-05 MED ORDER — WARFARIN SODIUM 5 MG PO TABS: ORAL_TABLET | ORAL | Status: DC

## 2014-06-05 NOTE — Progress Notes (Signed)
   Subjective:    Patient ID: Jennifer Brennan, female    DOB: 08/09/1923, 80 y.o.   MRN: [id number]  HPI Last INR was OK at 2.2 Has not been eating as many vitamin K foods. Denies unusual bleeding and bruising.   Denies CP, falls. Wearing oxygen.   Review of Systems  HENT: Negative for nosebleeds.   Respiratory: Positive for shortness of breath.   Cardiovascular: Negative for chest pain.  Gastrointestinal: Negative for blood in stool and anal bleeding.  Genitourinary: Negative for hematuria.  Hematological: Does not bruise/bleed easily.       Objective:   Physical Exam  Constitutional: She is oriented to person, place, and time. She appears well-developed and well-nourished.  HENT:  Right Ear: External ear normal.  Left Ear: External ear normal.  Eyes:  blind  Cardiovascular: Normal rate, regular rhythm and normal heart sounds.   Pulmonary/Chest: Effort normal and breath sounds normal.  Neurological: She is alert and oriented to person, place, and time.  Skin: Skin is warm and dry.  Psychiatric: She has a normal mood and affect. Her behavior is normal. Judgment and thought content normal.  Vitals reviewed.  BP:  142/88  HR:  63,  Wt:  151lb       Assessment & Plan:  1.  INR above goal 2-3 2.  Hold Coumadin today. 3.  Then start Coumadin 2.5mg  QD except 5mg  M/F 4.  RTC 2 weeks

## 2014-06-05 NOTE — Patient Instructions (Signed)
No Coumadin today. Then start Coumadin 2.5mg  (1/2 tablet) daily except 5mg  (1 tablet) on Mondays and Fridays  INR 4.2

## 2014-06-07 ENCOUNTER — Telehealth: Payer: Self-pay | Admitting: *Deleted

## 2014-06-07 ENCOUNTER — Other Ambulatory Visit: Payer: Self-pay | Admitting: *Deleted

## 2014-06-07 MED ORDER — LOSARTAN POTASSIUM 50 MG PO TABS: ORAL_TABLET | ORAL | Status: AC

## 2014-06-07 NOTE — Telephone Encounter (Signed)
Called patient to inform the family that Dr. Nyoka Cowden has made some changes to the patient's BP medication ( Losartan 50 mg) 1 QD. The daughter stated that she understood the instruction for the new medication, and would pick it up from the pharmacy.

## 2014-06-19 ENCOUNTER — Ambulatory Visit (INDEPENDENT_AMBULATORY_CARE_PROVIDER_SITE_OTHER): Payer: Medicare Other | Admitting: Pharmacotherapy

## 2014-06-19 ENCOUNTER — Encounter: Payer: Self-pay | Admitting: Pharmacotherapy

## 2014-06-19 VITALS — BP 138/80 | HR 55 | Temp 97.7°F | Resp 10 | Ht 63.0 in | Wt 155.0 lb

## 2014-06-19 DIAGNOSIS — Z7901 Long term (current) use of anticoagulants: Secondary | ICD-10-CM

## 2014-06-19 LAB — POCT INR: INR: 1.7

## 2014-06-19 MED ORDER — WARFARIN SODIUM 5 MG PO TABS: ORAL_TABLET | ORAL | Status: DC

## 2014-06-19 NOTE — Patient Instructions (Signed)
INR 1.7 Increase Coumadin 2.5mg  daily except 5mg  on Mondays, Wednesdays, Fridays

## 2014-06-19 NOTE — Progress Notes (Signed)
   Subjective:    Patient ID: Jennifer Brennan, female    DOB: 1923/08/13, 80 y.o.   MRN: [id number]  HPI Last INR was high at 4.2. Coumadin was held, then reduced to 2.5mg  QD except 5mg  M/F Denies missed doses  Denies unusual bleeding and bruising. Denies CP, SOB, falls Did eat more vitamin K foods.   Review of Systems  HENT: Negative for nosebleeds.   Respiratory: Negative for shortness of breath.   Cardiovascular: Negative for chest pain.  Gastrointestinal: Negative for blood in stool and anal bleeding.  Genitourinary: Negative for hematuria.  Hematological: Does not bruise/bleed easily.       Objective:   Physical Exam  Constitutional: She appears well-developed and well-nourished.  HENT:  Right Ear: External ear normal.  Left Ear: External ear normal.  Eyes:  blind  Cardiovascular: Normal rate, regular rhythm and normal heart sounds.   Pulmonary/Chest: Effort normal and breath sounds normal.  Neurological: She is alert.  Skin: Skin is warm and dry.  Psychiatric: She has a normal mood and affect. Her behavior is normal. Judgment and thought content normal.  Vitals reviewed.  BP:  13880  HR:  55  Wt:  155lb INR 1.7        Assessment & Plan:  1.  INR below goal 2-3 2.  Increase Coumadin 2.5mg  QD except 5mg  MWF 3.  RTC in 3 weeks

## 2014-07-03 ENCOUNTER — Other Ambulatory Visit: Payer: Self-pay

## 2014-07-03 ENCOUNTER — Other Ambulatory Visit: Payer: Self-pay | Admitting: Internal Medicine

## 2014-07-03 MED ORDER — LEVETIRACETAM 1000 MG PO TABS: ORAL_TABLET | ORAL | Status: DC

## 2014-07-03 NOTE — Telephone Encounter (Signed)
Patient is allergic to dye and need Keppra changed to 1000 mg because the 500 mg are no longer available in white pills due to manufacture change. Please change dose and send to pharmacy   Patient's husband aware rx sent to pharmacy

## 2014-07-04 ENCOUNTER — Other Ambulatory Visit: Payer: Self-pay | Admitting: *Deleted

## 2014-07-04 MED ORDER — LEVETIRACETAM 1000 MG PO TABS: ORAL_TABLET | ORAL | Status: DC

## 2014-07-04 NOTE — Telephone Encounter (Signed)
[facility] Fax ORder. Patient needed to change from 500 mg twice daily to 1000mg  1/2 tablet twice daily due to red dye in 500mg . Patient is allergic to red dye.

## 2014-07-10 ENCOUNTER — Ambulatory Visit (INDEPENDENT_AMBULATORY_CARE_PROVIDER_SITE_OTHER): Payer: Medicare Other | Admitting: Pharmacotherapy

## 2014-07-10 ENCOUNTER — Encounter: Payer: Self-pay | Admitting: Pharmacotherapy

## 2014-07-10 VITALS — BP 146/78 | HR 69 | Temp 98.0°F

## 2014-07-10 DIAGNOSIS — Z7901 Long term (current) use of anticoagulants: Secondary | ICD-10-CM

## 2014-07-10 LAB — POCT INR: INR: 2

## 2014-07-10 NOTE — Patient Instructions (Signed)
INR 2.0 Continue Coumadin 2.5mg  (1/2 tablet) daily except 5mg  (1 tablet) on Mondays, Wednesdays, and Fridays

## 2014-07-10 NOTE — Progress Notes (Signed)
   Subjective:    Patient ID: Jennifer Brennan, female    DOB: [birth year], 81 y.o.   MRN: [id number]  HPI  Last INR was low at 1.7. Coumadin was increased to 2.5mg  qD except 5mg  MWF. Denies missed doses. Denies unusual bleeding or bruising. Denies CP, falls Consistent with vitamin K intake.    Review of Systems  HENT: Negative for nosebleeds.   Respiratory: Positive for shortness of breath.   Cardiovascular: Negative for chest pain.  Gastrointestinal: Negative for blood in stool and anal bleeding.  Genitourinary: Negative for hematuria.  Hematological: Does not bruise/bleed easily.       Objective:   Physical Exam  Constitutional: She is oriented to person, place, and time. She appears well-developed and well-nourished.  HENT:  Right Ear: External ear normal.  Left Ear: External ear normal.  Eyes:  blind  Cardiovascular: Normal rate, regular rhythm and normal heart sounds.   Pulmonary/Chest: Effort normal and breath sounds normal.  Neurological: She is alert and oriented to person, place, and time.  Skin: Skin is warm and dry.  Psychiatric: She has a normal mood and affect. Her behavior is normal. Judgment and thought content normal.  Vitals reviewed.   BP:  146/78  HR:  69  INR 2.0       Assessment & Plan:  1.  INR at goal 2-3 2.  Continue Coumadin 2.5mg  QD except 5mg  MWF 3.  RTC in 1 month

## 2014-07-28 ENCOUNTER — Other Ambulatory Visit: Payer: Self-pay | Admitting: Internal Medicine

## 2014-08-04 ENCOUNTER — Ambulatory Visit: Payer: Self-pay | Admitting: Internal Medicine

## 2014-08-14 ENCOUNTER — Ambulatory Visit (INDEPENDENT_AMBULATORY_CARE_PROVIDER_SITE_OTHER): Payer: Medicare Other | Admitting: Pharmacotherapy

## 2014-08-14 ENCOUNTER — Encounter: Payer: Self-pay | Admitting: Pharmacotherapy

## 2014-08-14 VITALS — BP 120/66 | HR 51 | Temp 97.3°F | Resp 18 | Ht 63.0 in | Wt 164.3 lb

## 2014-08-14 DIAGNOSIS — Z7901 Long term (current) use of anticoagulants: Secondary | ICD-10-CM

## 2014-08-14 LAB — POCT INR: INR: 1.7

## 2014-08-14 NOTE — Patient Instructions (Signed)
INR 1.7 Continue Coumadin 2.79m (1/2 tablet) daily except 513m(1 tablet) Mondays, Wednesdays, Fridays

## 2014-08-14 NOTE — Progress Notes (Signed)
   Subjective:    Patient ID: Jennifer Brennan, female    DOB: [birth year], 81 y.o.   MRN: [id number]  HPI Last INR was OK at 2.0 Current Coumadin dose is 2.5mg  QD except 5mg  MWF Missed 1 dose this week. Consistent with vitamin K intake. Denies unusual bleeding or bruising. Denies CP, SOB, falls   Review of Systems  HENT: Negative for nosebleeds.   Respiratory: Negative for shortness of breath.   Cardiovascular: Negative for chest pain.  Gastrointestinal: Negative for blood in stool and anal bleeding.  Genitourinary: Negative for hematuria.  Hematological: Does not bruise/bleed easily.       Objective:   Physical Exam  Constitutional: She is oriented to person, place, and time. She appears well-developed and well-nourished.  HENT:  Right Ear: External ear normal.  Left Ear: External ear normal.  Cardiovascular: Normal rate, regular rhythm and normal heart sounds.   Pulmonary/Chest: Effort normal and breath sounds normal.  Neurological: She is alert and oriented to person, place, and time.  Skin: Skin is warm and dry.  Psychiatric: She has a normal mood and affect. Her behavior is normal. Judgment and thought content normal.  Vitals reviewed.  BP:  120/66  HR:  51  Wt:  164lb INR 1.7       Assessment & Plan:  1.  INR below goal 2-3 due to missed dose. 2.  Continue Coumadin 2.5mg  QD except 5mg  MWF 3.  RTC in 1 month

## 2014-08-16 ENCOUNTER — Encounter: Payer: Self-pay | Admitting: Internal Medicine

## 2014-08-16 ENCOUNTER — Ambulatory Visit (INDEPENDENT_AMBULATORY_CARE_PROVIDER_SITE_OTHER): Payer: Medicare Other | Admitting: Internal Medicine

## 2014-08-16 VITALS — BP 132/88 | HR 55 | Temp 98.3°F | Ht 63.0 in | Wt 173.4 lb

## 2014-08-16 DIAGNOSIS — M79661 Pain in right lower leg: Secondary | ICD-10-CM

## 2014-08-16 DIAGNOSIS — I872 Venous insufficiency (chronic) (peripheral): Secondary | ICD-10-CM

## 2014-08-16 DIAGNOSIS — F0280 Dementia in other diseases classified elsewhere without behavioral disturbance: Secondary | ICD-10-CM

## 2014-08-16 DIAGNOSIS — G309 Alzheimer's disease, unspecified: Secondary | ICD-10-CM

## 2014-08-16 DIAGNOSIS — I10 Essential (primary) hypertension: Secondary | ICD-10-CM

## 2014-08-16 DIAGNOSIS — M461 Sacroiliitis, not elsewhere classified: Secondary | ICD-10-CM

## 2014-08-16 DIAGNOSIS — L03115 Cellulitis of right lower limb: Secondary | ICD-10-CM

## 2014-08-16 MED ORDER — CIPROFLOXACIN HCL 500 MG PO TABS: ORAL_TABLET | ORAL | Status: DC

## 2014-08-16 MED ORDER — METHADONE HCL 5 MG PO TABS: ORAL_TABLET | ORAL | Status: DC

## 2014-08-16 NOTE — Progress Notes (Signed)
Patient ID: Jennifer Brennan, female   DOB: [birth year], 81 y.o.   MRN: [id number]    Facility  [facility]    Place of Service:   OFFICE   Allergies  Allergen Reactions  . Bactrim [Sulfamethoxazole-Trimethoprim] Other (See Comments)    MD stopped due to kidney failure Also causes lips to swell  . Doxycycline Swelling    Causes lips to swell, wheezing  . Furosemide Other (See Comments)    MD stopped due to kidney failure  . Percocet [Oxycodone-Acetaminophen] Other (See Comments)    Causes SEIZURES  . Valsartan Other (See Comments)    MD stopped due to kidney failure  . Adhesive [Tape]   . Penicillins Hives  . Morphine Sulfate Itching and Rash  . Sertraline Hcl Rash    Chief Complaint  Patient presents with  . Acute Visit    Complains of Legs Weeping and skin coming off.    HPI:  Cellulitis of leg, right: Acute visit for new onset of cellulitis of the distal right leg. Patient has chronic venous stasis changes of the skin. At time she has had plaques developing on the skin. Her daughter was cleansing these off and several areas of skin were peeled off with them. Legs are now weeping again at the area of skin disruption. A crimson erythema started yesterday and is accompanied by pain. She has been to visit her child Debroah Baller who has surgery and contracted klebsiella. Family wonders if this could be contagious. Debroah Baller has been on antibiotics 4 weeks.  Alzheimer's disease: Unchanged  Chronic venous insufficiency: Has produced chronic venous stasis changes in the skin with thinning of the skin and susceptibility to trauma.  Essential hypertension: Controlled  Pain of right lower leg: I believe related to the cellulitis.    Medications: Patient's Medications  New Prescriptions   No medications on file  Previous Medications   ACETAMINOPHEN (TYLENOL) 500 MG TABLET    Take 1,000 mg by mouth every 6 (six) hours as needed for mild pain, fever or headache. pain   BRIMONIDINE (ALPHAGAN) 0.15  % OPHTHALMIC SOLUTION    Place 1 drop into both eyes 2 (two) times daily.   CHOLECALCIFEROL 2000 UNITS TABS    Take 1 tablet by mouth every morning.   CRANBERRY-VITAMIN C-VITAMIN E PO    Take by mouth. 450 mg Cranberry/ 250 mg Vitamin C   DILTIAZEM 2 % GEL    Apply 1 application topically 2 (two) times daily. Apply to anal fissures. Place a small amount just inside and outside of anus as needed   DIPHENHYDRAMINE (BENADRYL) 25 MG TABLET    Take 25 mg by mouth every 6 (six) hours as needed. 1/2 as needed   DORZOLAMIDE-TIMOLOL (COSOPT) 22.3-6.8 MG/ML OPHTHALMIC SOLUTION    Place 1 drop into both eyes 2 (two) times daily.     FLUOCINOLONE ACETONIDE 0.01 % OIL    Use on scalp for psoriasis   FLUTICASONE-SALMETEROL (ADVAIR) 250-50 MCG/DOSE AEPB    Inhale one puff every 12 hours to help breathing   GABAPENTIN (NEURONTIN) 300 MG CAPSULE    2 at bedtime to help pains   HYDROCORTISONE (ANUSOL-HC) 25 MG SUPPOSITORY    Place 1 suppository (25 mg total) rectally 2 (two) times daily.   IPRATROPIUM-ALBUTEROL (COMBIVENT RESPIMAT) 20-100 MCG/ACT AERS RESPIMAT    Inhale 2 puffs into the lungs every 6 (six) hours. Take 2 puffs 4 times daily to help with breathing as needed.   LETROZOLE (FEMARA) 2.5 MG TABLET  Take 1 tablet (2.5 mg total) by mouth at bedtime.   LEVETIRACETAM (KEPPRA) 1000 MG TABLET    Take 1/2 tablet by mouth twice daily, in place of 500 mg BID for seizure prevention   LEVOTHYROXINE (SYNTHROID, LEVOTHROID) 88 MCG TABLET    One daily for thyroid supplement   LOSARTAN (COZAAR) 50 MG TABLET    Take 1 tablet daily by mouth for blood pressure.   MEMANTINE HCL ER 28 MG CP24    One daily to help preserve memory   METHADONE (DOLOPHINE) 5 MG TABLET    Take 1.25 by mouth at bedtime. Take one every 12 hours if needed for pain   MULTIPLE VITAMIN (MULTIVITAMIN) TABLET    Centrum Silver:  Take 1 tablet daily   MULTIPLE VITAMINS-MINERALS (DECUBI-VITE) CAPS    Take 1 capsule by mouth daily.    NEOMYCIN-BACITRACIN-POLYMYXIN (NEOSPORIN) OINTMENT    Apply 1 application topically 3 (three) times daily.    OMEPRAZOLE (PRILOSEC) 20 MG CAPSULE    Take 20 mg by mouth every other day. Take 1 tablet every other day to reduce stomach acids.   PILOCARPINE (PILOCAR) 1 % OPHTHALMIC SOLUTION    Place 1 drop into both eyes 2 (two) times daily.    POLYETHYL GLYCOL-PROPYL GLYCOL (SYSTANE) 0.4-0.3 % GEL    Apply 1 drop to eye 2 (two) times daily as needed.   POLYETHYLENE GLYCOL POWDER (GLYCOLAX) POWDER    17 gram in 4-8 oz fluid daily. Adjust as needed to achieve 1-2 soft stools daily.   PROAIR HFA 108 (90 BASE) MCG/ACT INHALER    inhale 2 puffs by mouth every 6 hours if needed for wheezing   PROBIOTIC PRODUCT (ALIGN) 4 MG CAPS    Take by mouth. Take 1 capsule daily to help irritable colon. Only while on antibiotics.   TRIAMCINOLONE CREAM (KENALOG) 0.1 %    APPLY TO AFFECTED AREA TWICE A DAY TO RASH UNTIL IT IS RESOLVED   WARFARIN (COUMADIN) 5 MG TABLET    Take 1/2 tablet (2.5mg ) daily except 1 tablet (5mg ) on Mondays, Wednesdays and Fridays  Modified Medications   No medications on file  Discontinued Medications   No medications on file     Review of Systems  Constitutional: Positive for fatigue. Negative for fever, diaphoresis, activity change, appetite change and unexpected weight change.  HENT: Positive for hearing loss. Negative for congestion and ear pain.   Eyes: Positive for visual disturbance.       Nearly blind bilaterally.  Respiratory: Negative for apnea, cough, choking, chest tightness, shortness of breath and wheezing.   Cardiovascular: Positive for leg swelling. Negative for chest pain and palpitations.  Gastrointestinal: Negative for abdominal pain and abdominal distention.  Endocrine: Negative for cold intolerance, polydipsia, polyphagia and polyuria.  Genitourinary: Positive for frequency. Negative for decreased urine volume, difficulty urinating and pelvic pain.       Urinary  incontinence.  Musculoskeletal: Positive for myalgias, arthralgias and gait problem. Negative for joint swelling, neck pain and neck stiffness.       History of back and leg pains.  Skin:       New-onset cellulitis the right lower leg. Painful.  Allergic/Immunologic: Negative.   Neurological: Positive for weakness. Negative for dizziness, tremors, seizures, syncope, facial asymmetry, speech difficulty, light-headedness, numbness and headaches.  Hematological: Negative.   Psychiatric/Behavioral: Positive for confusion, sleep disturbance, decreased concentration and agitation. Negative for suicidal ideas, hallucinations and behavioral problems. The patient is not nervous/anxious and is not hyperactive.  Filed Vitals:   08/16/14 1319  BP: 132/88  Pulse: 55  Temp: 98.3 F (36.8 C)  TempSrc: Oral  Height: 5\' 3"  (1.6 m)  Weight: 173 lb 6.4 oz (78.654 kg)   Body mass index is 30.72 kg/(m^2).  Physical Exam  Constitutional: No distress.  HENT:  Head: Normocephalic and atraumatic.  Diminished hearing bilaterally. Exposure of root of the lower left canine.  Eyes: Conjunctivae and EOM are normal. Left eye exhibits no discharge.   Nearly blind bilaterally  Neck: Normal range of motion. Neck supple. No JVD present. No tracheal deviation present. No thyromegaly present.  Cardiovascular: Normal rate, regular rhythm and normal heart sounds.  Exam reveals no gallop and no friction rub.   No murmur heard. Pulmonary/Chest: Effort normal and breath sounds normal. No respiratory distress. She has no wheezes. She has no rales.  Abdominal: Bowel sounds are normal. She exhibits no distension and no mass. There is no tenderness.  Musculoskeletal: Normal range of motion. She exhibits edema and tenderness.  Tender at both knees. Tender in the right groin area.  Lymphadenopathy:    She has no cervical adenopathy.  Neurological: She is alert. No cranial nerve deficit. Coordination normal.  Memory  deficit.  Skin: Skin is warm and dry. No rash noted. She is not diaphoretic. No erythema. No pallor.  Cellulitis right lower leg with erythema and open skin tears and tenderness.  Psychiatric:  Significant dementia. Calm at our office today.. Improvement episodes of agitation at home.     Labs reviewed: Office Visit on 08/14/2014  Component Date Value Ref Range Status  . INR 08/14/2014 1.7   Final  Office Visit on 07/10/2014  Component Date Value Ref Range Status  . INR 07/10/2014 2.0   Final  Office Visit on 06/19/2014  Component Date Value Ref Range Status  . INR 06/19/2014 1.7   Final  Office Visit on 06/05/2014  Component Date Value Ref Range Status  . INR 06/05/2014 4.2   Final  Appointment on 05/23/2014  Component Date Value Ref Range Status  . WBC 05/23/2014 6.6  3.9 - 10.3 10e3/uL Final  . NEUT# 05/23/2014 2.8  1.5 - 6.5 10e3/uL Final  . HGB 05/23/2014 12.7  11.6 - 15.9 g/dL Final  . HCT 05/23/2014 39.1  34.8 - 46.6 % Final  . Platelets 05/23/2014 201  145 - 400 10e3/uL Final  . MCV 05/23/2014 81.8  79.5 - 101.0 fL Final  . MCH 05/23/2014 26.6  25.1 - 34.0 pg Final  . MCHC 05/23/2014 32.5  31.5 - 36.0 g/dL Final  . RBC 05/23/2014 4.78  3.70 - 5.45 10e6/uL Final  . RDW 05/23/2014 14.6* 11.2 - 14.5 % Final  . lymph# 05/23/2014 2.9  0.9 - 3.3 10e3/uL Final  . MONO# 05/23/2014 0.7  0.1 - 0.9 10e3/uL Final  . Eosinophils Absolute 05/23/2014 0.2  0.0 - 0.5 10e3/uL Final  . Basophils Absolute 05/23/2014 0.0  0.0 - 0.1 10e3/uL Final  . NEUT% 05/23/2014 42.6  38.4 - 76.8 % Final  . LYMPH% 05/23/2014 43.7  14.0 - 49.7 % Final  . MONO% 05/23/2014 10.7  0.0 - 14.0 % Final  . EOS% 05/23/2014 2.7  0.0 - 7.0 % Final  . BASO% 05/23/2014 0.3  0.0 - 2.0 % Final  . Sodium 05/23/2014 145  136 - 145 mEq/L Final  . Potassium 05/23/2014 3.9  3.5 - 5.1 mEq/L Final  . Chloride 05/23/2014 106  98 - 109 mEq/L Final  . CO2 05/23/2014  30* 22 - 29 mEq/L Final  . Glucose 05/23/2014 101  70 -  140 mg/dl Final  . BUN 05/23/2014 13.0  7.0 - 26.0 mg/dL Final  . Creatinine 05/23/2014 1.1  0.6 - 1.1 mg/dL Final  . Total Bilirubin 05/23/2014 0.27  0.20 - 1.20 mg/dL Final  . Alkaline Phosphatase 05/23/2014 79  40 - 150 U/L Final  . AST 05/23/2014 19  5 - 34 U/L Final  . ALT 05/23/2014 15  0 - 55 U/L Final  . Total Protein 05/23/2014 6.8  6.4 - 8.3 g/dL Final  . Albumin 05/23/2014 3.2* 3.5 - 5.0 g/dL Final  . Calcium 05/23/2014 9.6  8.4 - 10.4 mg/dL Final  . Anion Gap 05/23/2014 9  3 - 11 mEq/L Final  . Glucose 05/23/2014 Negative  Negative mg/dL Final  . Bilirubin (Urine) 05/23/2014 Negative  Negative Final  . Ketones 05/23/2014 Negative  Negative mg/dL Final  . Specific Gravity, Urine 05/23/2014 1.015  1.003 - 1.035 Final  . Blood 05/23/2014 Negative  Negative Final  . pH 05/23/2014 5.0  4.6 - 8.0 Final  . Protein 05/23/2014 < 30  Negative- <30 mg/dL Final  . Urobilinogen, UR 05/23/2014 0.2  0.2 - 1 mg/dL Final  . Nitrite 05/23/2014 Negative  Negative Final  . Leukocyte Esterase 05/23/2014 Trace  Negative Final  . RBC / HPF 05/23/2014 Rare  0 - 2 Final  . WBC, UA 05/23/2014 0-2 with occ clump  0 - 2 Final  . Bacteria, UA 05/23/2014 Few  Negative- Trace Final  . Casts 05/23/2014 Hyaline  Negative Final  . Urine Culture, Routine 05/23/2014 Culture, Urine   Final   Comment: Final - ===== COLONY COUNT: ===== NO GROWTH NO GROWTH      Assessment/Plan  1. Cellulitis of leg, right - ciprofloxacin (CIPRO) 500 MG tablet; One twice daily for infection  Dispense: 20 tablet; Refill: 0  2. Alzheimer's disease Unchanged  3. Chronic venous insufficiency Unchanged  4. Essential hypertension Controlled  5. Pain of right lower leg - methadone (DOLOPHINE) 5 MG tablet; Take 1.25 by mouth at bedtime. Take one every 12 hours if needed for pain  Dispense: 30 tablet; Refill: 0  6. Sacroiliitis - methadone (DOLOPHINE) 5 MG tablet; Take 1.25 by mouth at bedtime. Take one every 12 hours  if needed for pain  Dispense: 30 tablet; Refill: 0

## 2014-08-21 ENCOUNTER — Ambulatory Visit (INDEPENDENT_AMBULATORY_CARE_PROVIDER_SITE_OTHER): Payer: Medicare Other | Admitting: Nurse Practitioner

## 2014-08-21 ENCOUNTER — Encounter: Payer: Self-pay | Admitting: Nurse Practitioner

## 2014-08-21 ENCOUNTER — Telehealth: Payer: Self-pay

## 2014-08-21 VITALS — BP 132/70 | HR 61 | Temp 97.1°F | Resp 12

## 2014-08-21 DIAGNOSIS — R5383 Other fatigue: Secondary | ICD-10-CM

## 2014-08-21 DIAGNOSIS — L03115 Cellulitis of right lower limb: Secondary | ICD-10-CM

## 2014-08-21 DIAGNOSIS — M79661 Pain in right lower leg: Secondary | ICD-10-CM

## 2014-08-21 DIAGNOSIS — Z7901 Long term (current) use of anticoagulants: Secondary | ICD-10-CM

## 2014-08-21 DIAGNOSIS — M461 Sacroiliitis, not elsewhere classified: Secondary | ICD-10-CM

## 2014-08-21 LAB — BASIC METABOLIC PANEL
BUN / CREAT RATIO: 10 — AB (ref 11–26)
BUN: 21 mg/dL (ref 8–27)
CALCIUM: 8.8 mg/dL (ref 8.7–10.3)
CO2: 29 mmol/L (ref 18–29)
Chloride: 100 mmol/L (ref 96–108)
Creatinine, Ser: 2.06 mg/dL — ABNORMAL HIGH (ref 0.57–1.00)
GFR calc Af Amer: 24 mL/min/{1.73_m2} — ABNORMAL LOW (ref >59)
GFR calc non Af Amer: 21 mL/min/{1.73_m2} — ABNORMAL LOW (ref >59)
Glucose: 127 mg/dL — ABNORMAL HIGH (ref 65–99)
POTASSIUM: 4.1 mmol/L (ref 3.5–5.2)
Sodium: 139 mmol/L (ref 134–144)

## 2014-08-21 LAB — CBC WITH DIFFERENTIAL/PLATELET
BASOS: 0 %
Basophils Absolute: 0 10*3/uL (ref 0.0–0.2)
EOS ABS: 0.1 10*3/uL (ref 0.0–0.4)
EOS: 2 %
HCT: 37.7 % (ref 34.0–46.6)
Hemoglobin: 12.9 g/dL (ref 11.1–15.9)
LYMPHS ABS: 2.1 10*3/uL (ref 0.7–3.1)
Lymphs: 27 %
MCH: 26.2 pg — AB (ref 26.6–33.0)
MCHC: 34.2 g/dL (ref 31.5–35.7)
MCV: 77 fL — AB (ref 79–97)
Monocytes Absolute: 0.7 10*3/uL (ref 0.1–0.9)
Monocytes: 8 %
Neutrophils Absolute: 5 10*3/uL (ref 1.4–7.0)
Neutrophils Relative %: 63 %
PLATELETS: 226 10*3/uL (ref 150–379)
RBC: 4.93 x10E6/uL (ref 3.77–5.28)
RDW: 14.5 % (ref 12.3–15.4)
WBC: 7.9 10*3/uL (ref 3.4–10.8)

## 2014-08-21 LAB — POCT INR: INR: 1.9

## 2014-08-21 MED ORDER — IPRATROPIUM-ALBUTEROL 20-100 MCG/ACT IN AERS: 2.0000 | INHALATION_SPRAY | Freq: Four times a day (QID) | RESPIRATORY_TRACT | Status: AC

## 2014-08-21 MED ORDER — METHADONE HCL 5 MG PO TABS: ORAL_TABLET | ORAL | Status: DC

## 2014-08-21 NOTE — Telephone Encounter (Signed)
Sharyn Lull called from [place] to confirm that we received fax for STAT labs. Labs received and placed on ledge for review Glucose 127 Creatinine 2.06  BUN 10   FYI- patient with pending appointment for tomorrow with Dr.Green

## 2014-08-21 NOTE — Telephone Encounter (Signed)
Per Janett Billow: Renal (kidney function) is worse, push fluids as instructed during office visit and keep follow-up with Dr.Green.  I called Blanch Media (patient's daughter), Blanch Media verbalized understanding of Jessica's response to labs, Per Janett Billow if patient with any change in symptoms prior to appointment patient needs to go to ER.

## 2014-08-21 NOTE — Progress Notes (Signed)
Patient ID: Jennifer Brennan, female   DOB: [birth year], 81 y.o.   MRN: [id number]    PCP: Estill Dooms, MD  Allergies  Allergen Reactions  . Bactrim [Sulfamethoxazole-Trimethoprim] Other (See Comments)    MD stopped due to kidney failure Also causes lips to swell  . Doxycycline Swelling    Causes lips to swell, wheezing  . Furosemide Other (See Comments)    MD stopped due to kidney failure  . Percocet [Oxycodone-Acetaminophen] Other (See Comments)    Causes SEIZURES  . Valsartan Other (See Comments)    MD stopped due to kidney failure  . Adhesive [Tape]   . Penicillins Hives  . Morphine Sulfate Itching and Rash  . Sertraline Hcl Rash    Chief Complaint  Patient presents with  . Acute Visit    Patient was with decreased appetitie on Saturday (nothing to eat or drink), patient slept the whole day. Patient with swelling in right leg. Patient w/o bowel movement x 3 days. Patient has not taken methadone today (? related to sleepiness)     HPI: Patient is a 81 y.o. female seen in the office today due to evaluation of not eating or drinking. Dr Nyoka Cowden saw pt last week and was diagnosed with cellulitis and placed on cipro (10 days, has completed 5).  Never has gone 24 hours without eating or drinking. Was very sleepy. Better on Sunday due to saying she needed to go to the hospital if she did not eat or drink therefore she ate and drank on Sunday but was still very lethargic. Better today. Did not give methadone today due to the fact she had to go down 7 steps to get up to the house. Has to go up 7 steps to go back home-- daughter does not know how she is doing to get her back up the stairs.  Wound is very wet but the dressing is sticking to it and she is having to use saline and wound cleaner to get it off.  Staying in hospital bed and elevating leg. Has not had a BM in 3 days either. On bowel regimen but not effective.    Review of Systems:  Review of Systems  Past Medical History    Diagnosis Date  . Hypertension   . Hypothyroidism   . Seizure disorder   . Severe stage glaucoma     legally blind  . Depression with anxiety   . OAB (overactive bladder)   . DVT 05/2007    RLE following R THR - chronic coumadin, chronic RLE pain  . GLAUCOMA   . OSTEOPENIA   . Retinal ischemia   . Sciatica of right side     chronic RLE pain, multifactorial - MRI T/L spine 02/2011  . VITAMIN D DEFICIENCY dx 08/2008  . Venous ulcer of right lower extremity with varicose veins   . Dementia   . Vulvar abscess   . Unspecified hypothyroidism   . Unspecified vitamin D deficiency   . Unspecified urinary incontinence   . Renal disorder   . Varicose veins of lower extremity   . Varicose veins of lower extremities with ulcer   . Unspecified disorder of kidney and ureter   . Stevens-Johnson syndrome   . Sebaceous cyst   . Disorder of bone and cartilage, unspecified   . Contusion of lower leg   . Depression   . Seizures   . Osteoporosis, unspecified 05/12/2013  . Acute asthmatic bronchitis 09/18/2013  . DVT, lower extremity   .  ADENOCARCINOMA, LEFT BREAST 04/11/2010    s/p L mastectomy, on femara x 21yr  . OSTEOARTHRITIS, HANDS, BILATERAL   . Arthritis     "back; hands; toes" (09/26/2013)  . Chronic back pain     "upper and lower" (09/26/2013)  . Anxiety   . Blindness of both eyes     "from glaucoma". Also has bilateral occipital lobe infarcts.  . Cerebral atrophy   . Cerebrovascular disease   . Cerebral infarct     left parietal and bilateral occipital lobes   Past Surgical History  Procedure Laterality Date  . Total abdominal hysterectomy    . Total hip arthroplasty Right 10/2006  . Refractive surgery Bilateral   . Tonsillectomy    . Breast biopsy Left 03/2010  . Mastectomy Left 2011   Social History:   reports that she quit smoking about 34 years ago. Her smoking use included Cigarettes. She has a 45 pack-year smoking history. She has never used smokeless tobacco. She  reports that she drinks alcohol. She reports that she does not use illicit drugs.  Family History  Problem Relation Age of Onset  . Ovarian cancer Mother   . Cancer Mother   . Deep vein thrombosis Mother   . Heart disease Mother   . Arthritis Other     grandparents  . Lung cancer Other   . Heart disease Other     parent  . Cancer Father     BRAIN TUMOR  . Cancer Brother   . Hyperlipidemia Brother   . Hypertension Brother   . Stroke Brother   . Arthritis Brother   . Sickle cell anemia Daughter   . Heart disease Daughter   . Sickle cell anemia Daughter     Medications: Patient's Medications  New Prescriptions   No medications on file  Previous Medications   ACETAMINOPHEN (TYLENOL) 500 MG TABLET    Take 1,000 mg by mouth every 6 (six) hours as needed for mild pain, fever or headache. pain   BRIMONIDINE (ALPHAGAN) 0.15 % OPHTHALMIC SOLUTION    Place 1 drop into both eyes 2 (two) times daily.   CHOLECALCIFEROL 2000 UNITS TABS    Take 1 tablet by mouth every morning.   CIPROFLOXACIN (CIPRO) 500 MG TABLET    One twice daily for infection   CRANBERRY-VITAMIN C-VITAMIN E PO    Take by mouth. 450 mg Cranberry/ 250 mg Vitamin C   DILTIAZEM 2 % GEL    Apply 1 application topically 2 (two) times daily. Apply to anal fissures. Place a small amount just inside and outside of anus as needed   DIPHENHYDRAMINE (BENADRYL) 25 MG TABLET    Take 25 mg by mouth every 6 (six) hours as needed. 1/2 as needed   DORZOLAMIDE-TIMOLOL (COSOPT) 22.3-6.8 MG/ML OPHTHALMIC SOLUTION    Place 1 drop into both eyes 2 (two) times daily.     FLUOCINOLONE ACETONIDE 0.01 % OIL    Use on scalp for psoriasis   FLUTICASONE-SALMETEROL (ADVAIR) 250-50 MCG/DOSE AEPB    Inhale one puff every 12 hours to help breathing   GABAPENTIN (NEURONTIN) 300 MG CAPSULE    2 at bedtime to help pains   HYDROCORTISONE (ANUSOL-HC) 25 MG SUPPOSITORY    Place 1 suppository (25 mg total) rectally 2 (two) times daily.   LETROZOLE (FEMARA) 2.5  MG TABLET    Take 1 tablet (2.5 mg total) by mouth at bedtime.   LEVETIRACETAM (KEPPRA) 1000 MG TABLET    Take 1/2 tablet by  mouth twice daily, in place of 500 mg BID for seizure prevention   LEVOTHYROXINE (SYNTHROID, LEVOTHROID) 88 MCG TABLET    One daily for thyroid supplement   LOSARTAN (COZAAR) 50 MG TABLET    Take 1 tablet daily by mouth for blood pressure.   MEMANTINE HCL ER 28 MG CP24    One daily to help preserve memory   METHADONE (DOLOPHINE) 5 MG TABLET    Take 1.25 by mouth at bedtime. Take one every 12 hours if needed for pain   MULTIPLE VITAMIN (MULTIVITAMIN) TABLET    Centrum Silver:  Take 1 tablet daily   MULTIPLE VITAMINS-MINERALS (DECUBI-VITE) CAPS    Take 1 capsule by mouth daily.   NEOMYCIN-BACITRACIN-POLYMYXIN (NEOSPORIN) OINTMENT    Apply 1 application topically 3 (three) times daily.    OMEPRAZOLE (PRILOSEC) 20 MG CAPSULE    Take 20 mg by mouth every other day. Take 1 tablet every other day to reduce stomach acids.   PILOCARPINE (PILOCAR) 1 % OPHTHALMIC SOLUTION    Place 1 drop into both eyes 2 (two) times daily.    POLYETHYL GLYCOL-PROPYL GLYCOL (SYSTANE) 0.4-0.3 % GEL    Apply 1 drop to eye 2 (two) times daily as needed.   POLYETHYLENE GLYCOL POWDER (GLYCOLAX) POWDER    17 gram in 4-8 oz fluid daily. Adjust as needed to achieve 1-2 soft stools daily.   PROAIR HFA 108 (90 BASE) MCG/ACT INHALER    inhale 2 puffs by mouth every 6 hours if needed for wheezing   PROBIOTIC PRODUCT (ALIGN) 4 MG CAPS    Take by mouth. Take 1 capsule daily to help irritable colon. Only while on antibiotics.   TRIAMCINOLONE CREAM (KENALOG) 0.1 %    APPLY TO AFFECTED AREA TWICE A DAY TO RASH UNTIL IT IS RESOLVED   WARFARIN (COUMADIN) 5 MG TABLET    Take 1/2 tablet (2.5mg ) daily except 1 tablet (5mg ) on Mondays, Wednesdays and Fridays  Modified Medications   Modified Medication Previous Medication   IPRATROPIUM-ALBUTEROL (COMBIVENT RESPIMAT) 20-100 MCG/ACT AERS RESPIMAT Ipratropium-Albuterol (COMBIVENT  RESPIMAT) 20-100 MCG/ACT AERS respimat      Inhale 2 puffs into the lungs every 6 (six) hours. Take 2 puffs 4 times daily to help with breathing as needed.    Inhale 2 puffs into the lungs every 6 (six) hours. Take 2 puffs 4 times daily to help with breathing as needed.  Discontinued Medications   No medications on file     Physical Exam:  Filed Vitals:   08/21/14 1307  BP: 132/70  Pulse: 61  Temp: 97.1 F (36.2 C)  TempSrc: Oral  Resp: 12  SpO2: 96%    Physical Exam  Constitutional: No distress.  HENT:  Head: Normocephalic and atraumatic.  Eyes:   Nearly blind bilaterally  Neck: Normal range of motion. Neck supple.  Cardiovascular: Normal rate, regular rhythm and normal heart sounds.  Exam reveals no gallop and no friction rub.   No murmur heard. Pulmonary/Chest: Effort normal and breath sounds normal.  Abdominal: Soft. Bowel sounds are normal.  Musculoskeletal: She exhibits edema and tenderness.  Neurological: She is alert.  Memory deficit.  Skin: Skin is warm and dry. She is not diaphoretic. There is erythema.  Cellulitis right lower leg with erythema and open skin tears and tenderness. 4 X 7 cm wound to leg, red center.   Psychiatric:  Significant dementia.     Labs reviewed: Basic Metabolic Panel:  Recent Labs  09/21/13 1625 09/25/13 0135 02/06/14 1309 05/23/14  1534  NA 145* 141 144 145  K 4.0 3.8 4.2 3.9  CL 99 97 102  --   CO2 23 30 30* 30*  GLUCOSE 100* 120* 92 101  BUN 21 18 13  13.0  CREATININE 1.22* 1.10 1.10* 1.1  CALCIUM 9.3 9.4 9.4 9.6  TSH  --   --  11.420*  --    Liver Function Tests:  Recent Labs  09/18/13 1614  09/25/13 0135 02/06/14 1309 05/23/14 1534  AST 22  < > 21 18 19   ALT 13  < > 16 12 15   ALKPHOS 69  < > 70 79 79  BILITOT 0.2*  < > 0.2* 0.3 0.27  PROT 6.8  < > 7.0 6.5 6.8  ALBUMIN 3.1*  --  3.1*  --  3.2*  < > = values in this interval not displayed. No results for input(s): LIPASE, AMYLASE in the last 8760 hours. No  results for input(s): AMMONIA in the last 8760 hours. CBC:  Recent Labs  09/25/13 0135  09/26/13 0545 02/06/14 1309 05/23/14 1534  WBC 8.4  < > 9.1 6.0 6.6  NEUTROABS 4.3  --   --  2.8 2.8  HGB 14.3  < > 12.8 12.6 12.7  HCT 41.9  < > 38.4 39.3 39.1  MCV 79.2  < > 79.7 80 81.8  PLT 214  < > 217 222 201  < > = values in this interval not displayed. Lipid Panel: No results for input(s): CHOL, HDL, LDLCALC, TRIG, CHOLHDL, LDLDIRECT in the last 8760 hours. TSH:  Recent Labs  02/06/14 1309  TSH 11.420*   A1C: Lab Results  Component Value Date   HGBA1C 5.7 08/06/2007     Assessment/Plan 1. Chronic anticoagulation -on chronic coumadin due to a fib,  - POC INR 1.9- at goal, will cont current dose while on cipro  2. Lethargy -pt with increased lethargy, methadone increased and sedation started. No methadone today and has improved. -to push fluids at this time - CBC with Differential/Platelet - Basic metabolic panel  3. Pain of right lower leg -to not increase methadone, may be causing increased sedation  - methadone (DOLOPHINE) 5 MG tablet; Take 1/4 by mouth at bedtime.  Dispense: 30 tablet; Refill: 0  4. Cellulitis with leg wound -to cont Cipro to complete course -follow wound care instructions by Dr Nyoka Cowden, may increase frequency to twice daily  To keep routine follow up with Dr Nyoka Cowden.

## 2014-08-21 NOTE — Patient Instructions (Addendum)
Dressing changes per Dr Hervey Ard instruction. May change twice daily if needed  Will get lab work today  Encourage hydration  May add colace to bowel regimen or enema if needed   Cont to hold methadone or give original dose (no more)    If unable to keep hydrated- to go to the emergency department

## 2014-08-22 ENCOUNTER — Ambulatory Visit (INDEPENDENT_AMBULATORY_CARE_PROVIDER_SITE_OTHER): Payer: Medicare Other | Admitting: Internal Medicine

## 2014-08-22 VITALS — BP 130/80 | HR 66 | Temp 98.1°F | Resp 18

## 2014-08-22 DIAGNOSIS — S81801D Unspecified open wound, right lower leg, subsequent encounter: Secondary | ICD-10-CM

## 2014-08-22 DIAGNOSIS — S81001D Unspecified open wound, right knee, subsequent encounter: Secondary | ICD-10-CM | POA: Diagnosis not present

## 2014-08-22 DIAGNOSIS — G309 Alzheimer's disease, unspecified: Secondary | ICD-10-CM | POA: Diagnosis not present

## 2014-08-22 DIAGNOSIS — S91001D Unspecified open wound, right ankle, subsequent encounter: Secondary | ICD-10-CM | POA: Diagnosis not present

## 2014-08-22 DIAGNOSIS — F0280 Dementia in other diseases classified elsewhere without behavioral disturbance: Secondary | ICD-10-CM

## 2014-08-22 DIAGNOSIS — I10 Essential (primary) hypertension: Secondary | ICD-10-CM

## 2014-08-22 DIAGNOSIS — L03115 Cellulitis of right lower limb: Secondary | ICD-10-CM | POA: Diagnosis not present

## 2014-08-22 NOTE — Progress Notes (Signed)
Patient ID: Jennifer Brennan, female   DOB: 05-30-1924, 81 y.o.   MRN: [id number]    Facility  [facility]    Place of Service:   OFFICE   Allergies  Allergen Reactions  . Bactrim [Sulfamethoxazole-Trimethoprim] Other (See Comments)    MD stopped due to kidney failure Also causes lips to swell  . Doxycycline Swelling    Causes lips to swell, wheezing  . Furosemide Other (See Comments)    MD stopped due to kidney failure  . Percocet [Oxycodone-Acetaminophen] Other (See Comments)    Causes SEIZURES  . Valsartan Other (See Comments)    MD stopped due to kidney failure  . Adhesive [Tape]   . Penicillins Hives  . Morphine Sulfate Itching and Rash  . Sertraline Hcl Rash    Chief Complaint  Patient presents with  . Medical Management of Chronic Issues    HPI:  Continues to have some erythema of both lower legs. The skin tear of her right leg is healing and there are no signs of infection. The wound continues to exude fluid. Wound is sticking to even Telfa dressings. Daughter is applying Coban wrap to the area loosely.  Patient has been having some drowsiness and poor intake. His not drinking enough fluids. Methadone is being held and Benadryl is being held.  Medications: Patient's Medications  New Prescriptions   No medications on file  Previous Medications   ACETAMINOPHEN (TYLENOL) 500 MG TABLET    Take 1,000 mg by mouth every 6 (six) hours as needed for mild pain, fever or headache. pain   BRIMONIDINE (ALPHAGAN) 0.15 % OPHTHALMIC SOLUTION    Place 1 drop into both eyes 2 (two) times daily.   CHOLECALCIFEROL 2000 UNITS TABS    Take 1 tablet by mouth every morning.   CIPROFLOXACIN (CIPRO) 500 MG TABLET    One twice daily for infection   CRANBERRY-VITAMIN C-VITAMIN E PO    Take by mouth. 450 mg Cranberry/ 250 mg Vitamin C   DILTIAZEM 2 % GEL    Apply 1 application topically 2 (two) times daily. Apply to anal fissures. Place a small amount just inside and outside of anus as needed   DIPHENHYDRAMINE (BENADRYL) 25 MG TABLET    Take 25 mg by mouth every 6 (six) hours as needed. 1/2 as needed   DORZOLAMIDE-TIMOLOL (COSOPT) 22.3-6.8 MG/ML OPHTHALMIC SOLUTION    Place 1 drop into both eyes 2 (two) times daily.     FLUOCINOLONE ACETONIDE 0.01 % OIL    Use on scalp for psoriasis   FLUTICASONE-SALMETEROL (ADVAIR) 250-50 MCG/DOSE AEPB    Inhale one puff every 12 hours to help breathing   GABAPENTIN (NEURONTIN) 300 MG CAPSULE    2 at bedtime to help pains   HYDROCORTISONE (ANUSOL-HC) 25 MG SUPPOSITORY    Place 1 suppository (25 mg total) rectally 2 (two) times daily.   IPRATROPIUM-ALBUTEROL (COMBIVENT RESPIMAT) 20-100 MCG/ACT AERS RESPIMAT    Inhale 2 puffs into the lungs every 6 (six) hours. Take 2 puffs 4 times daily to help with breathing as needed.   LETROZOLE (FEMARA) 2.5 MG TABLET    Take 1 tablet (2.5 mg total) by mouth at bedtime.   LEVETIRACETAM (KEPPRA) 1000 MG TABLET    Take 1/2 tablet by mouth twice daily, in place of 500 mg BID for seizure prevention   LEVOTHYROXINE (SYNTHROID, LEVOTHROID) 88 MCG TABLET    One daily for thyroid supplement   LOSARTAN (COZAAR) 50 MG TABLET    Take 1 tablet  daily by mouth for blood pressure.   MEMANTINE HCL ER 28 MG CP24    One daily to help preserve memory   METHADONE (DOLOPHINE) 5 MG TABLET    Take 1/4 by mouth at bedtime.   MULTIPLE VITAMIN (MULTIVITAMIN) TABLET    Centrum Silver:  Take 1 tablet daily   MULTIPLE VITAMINS-MINERALS (DECUBI-VITE) CAPS    Take 1 capsule by mouth daily.   NEOMYCIN-BACITRACIN-POLYMYXIN (NEOSPORIN) OINTMENT    Apply 1 application topically 3 (three) times daily.    OMEPRAZOLE (PRILOSEC) 20 MG CAPSULE    Take 20 mg by mouth every other day. Take 1 tablet every other day to reduce stomach acids.   PILOCARPINE (PILOCAR) 1 % OPHTHALMIC SOLUTION    Place 1 drop into both eyes 2 (two) times daily.    POLYETHYL GLYCOL-PROPYL GLYCOL (SYSTANE) 0.4-0.3 % GEL    Apply 1 drop to eye 2 (two) times daily as needed.    POLYETHYLENE GLYCOL POWDER (GLYCOLAX) POWDER    17 gram in 4-8 oz fluid daily. Adjust as needed to achieve 1-2 soft stools daily.   PROAIR HFA 108 (90 BASE) MCG/ACT INHALER    inhale 2 puffs by mouth every 6 hours if needed for wheezing   PROBIOTIC PRODUCT (ALIGN) 4 MG CAPS    Take by mouth. Take 1 capsule daily to help irritable colon. Only while on antibiotics.   TRIAMCINOLONE CREAM (KENALOG) 0.1 %    APPLY TO AFFECTED AREA TWICE A DAY TO RASH UNTIL IT IS RESOLVED   WARFARIN (COUMADIN) 5 MG TABLET    Take 1/2 tablet (2.5mg ) daily except 1 tablet (5mg ) on Mondays, Wednesdays and Fridays  Modified Medications   No medications on file  Discontinued Medications   No medications on file     Review of Systems  Constitutional: Positive for fatigue. Negative for fever, diaphoresis, activity change, appetite change and unexpected weight change.  HENT: Positive for hearing loss. Negative for congestion and ear pain.   Eyes: Positive for visual disturbance.       Nearly blind bilaterally.  Respiratory: Negative for apnea, cough, choking, chest tightness, shortness of breath and wheezing.   Cardiovascular: Positive for leg swelling. Negative for chest pain and palpitations.  Gastrointestinal: Negative for abdominal pain and abdominal distention.  Endocrine: Negative for cold intolerance, polydipsia, polyphagia and polyuria.  Genitourinary: Positive for frequency. Negative for decreased urine volume, difficulty urinating and pelvic pain.       Urinary incontinence.  Musculoskeletal: Positive for myalgias, arthralgias and gait problem. Negative for joint swelling, neck pain and neck stiffness.       History of back and leg pains.  Skin:       Resolved cellulitis the right lower leg. Residual erythema of leg. Wound from skin tear of the medial right leg.  Allergic/Immunologic: Negative.   Neurological: Positive for weakness. Negative for dizziness, tremors, seizures, syncope, facial asymmetry, speech  difficulty, light-headedness, numbness and headaches.  Hematological: Negative.   Psychiatric/Behavioral: Positive for confusion, sleep disturbance, decreased concentration and agitation. Negative for suicidal ideas, hallucinations and behavioral problems. The patient is not nervous/anxious and is not hyperactive.     Filed Vitals:   08/22/14 1422  BP: 130/80  Pulse: 66  Temp: 98.1 F (36.7 C)  TempSrc: Oral  Resp: 18  SpO2: 94%   There is no weight on file to calculate BMI.  Physical Exam  Constitutional: No distress.  HENT:  Head: Normocephalic and atraumatic.  Diminished hearing bilaterally. Exposure of root of  the lower left canine.  Eyes: Conjunctivae and EOM are normal. Left eye exhibits no discharge.   Nearly blind bilaterally  Neck: Normal range of motion. Neck supple. No JVD present. No tracheal deviation present. No thyromegaly present.  Cardiovascular: Normal rate, regular rhythm and normal heart sounds.  Exam reveals no gallop and no friction rub.   No murmur heard. Pulmonary/Chest: Effort normal and breath sounds normal. No respiratory distress. She has no wheezes. She has no rales.  Abdominal: Bowel sounds are normal. She exhibits no distension and no mass. There is no tenderness.  Musculoskeletal: Normal range of motion. She exhibits edema and tenderness.  Tender at both knees. Tender in the right groin area.  Lymphadenopathy:    She has no cervical adenopathy.  Neurological: She is alert. No cranial nerve deficit. Coordination normal.  Memory deficit.  Skin: Skin is warm and dry. No rash noted. She is not diaphoretic. No erythema. No pallor.  Cellulitis right lower leg with erythema and open skin tears and tenderness.  Psychiatric:  Significant dementia. Calm at our office today.. Improvement episodes of agitation at home.     Labs reviewed: Office Visit on 08/21/2014  Component Date Value Ref Range Status  . INR 08/21/2014 1.9   Final   No missed  doses, no bleeding  . WBC 08/21/2014 7.9  3.4 - 10.8 x10E3/uL Final  . RBC 08/21/2014 4.93  3.77 - 5.28 x10E6/uL Final  . Hemoglobin 08/21/2014 12.9  11.1 - 15.9 g/dL Final  . HCT 08/21/2014 37.7  34.0 - 46.6 % Final  . MCV 08/21/2014 77* 79 - 97 fL Final  . MCH 08/21/2014 26.2* 26.6 - 33.0 pg Final  . MCHC 08/21/2014 34.2  31.5 - 35.7 g/dL Final  . RDW 08/21/2014 14.5  12.3 - 15.4 % Final  . Platelets 08/21/2014 226  150 - 379 x10E3/uL Final  . Neutrophils Relative % 08/21/2014 63   Final  . Lymphs 08/21/2014 27   Final  . Monocytes 08/21/2014 8   Final  . Eos 08/21/2014 2   Final  . Basos 08/21/2014 0   Final  . Neutrophils Absolute 08/21/2014 5.0  1.4 - 7.0 x10E3/uL Final  . Lymphocytes Absolute 08/21/2014 2.1  0.7 - 3.1 x10E3/uL Final  . Monocytes Absolute 08/21/2014 0.7  0.1 - 0.9 x10E3/uL Final  . Eosinophils Absolute 08/21/2014 0.1  0.0 - 0.4 x10E3/uL Final  . Basophils Absolute 08/21/2014 0.0  0.0 - 0.2 x10E3/uL Final  . Glucose 08/21/2014 127* 65 - 99 mg/dL Final  . BUN 08/21/2014 21  8 - 27 mg/dL Final  . Creatinine, Ser 08/21/2014 2.06* 0.57 - 1.00 mg/dL Final  . GFR calc non Af Amer 08/21/2014 21* >59 mL/min/1.73 Final  . GFR calc Af Amer 08/21/2014 24* >59 mL/min/1.73 Final  . BUN/Creatinine Ratio 08/21/2014 10* 11 - 26 Final  . Sodium 08/21/2014 139  134 - 144 mmol/L Final  . Potassium 08/21/2014 4.1  3.5 - 5.2 mmol/L Final  . Chloride 08/21/2014 100  96 - 108 mmol/L Final  . CO2 08/21/2014 29  18 - 29 mmol/L Final  . Calcium 08/21/2014 8.8  8.7 - 10.3 mg/dL Final  Office Visit on 08/14/2014  Component Date Value Ref Range Status  . INR 08/14/2014 1.7   Final  Office Visit on 07/10/2014  Component Date Value Ref Range Status  . INR 07/10/2014 2.0   Final  Office Visit on 06/19/2014  Component Date Value Ref Range Status  . INR 06/19/2014 1.7  Final  Office Visit on 06/05/2014  Component Date Value Ref Range Status  . INR 06/05/2014 4.2   Final      Assessment/Plan  1. Cellulitis of leg, right resolved  2. Open wound of knee, leg (except thigh), and ankle, complicated, right, subsequent encounter Cleansed with soap and water. Applied Tegaderm to wound. Covered with Ace wrap. Instructed daughter to inspect wound daily for signs of infection.Leave current dressing in place. Will return to office in 1 week for bandage change.  3. Alzheimer's disease Recent drowsiness is likely due to medications, the recent cellulitis, and progression of her Alzheimer's disease. We recommended that the methadone continue to be held since she seems to be doing okay without it. Benadryl will also be held.  4. Essential hypertension controlled

## 2014-08-22 NOTE — Patient Instructions (Signed)
May remove wraps when she is in bed. Check wound for infection daily. Do not remove the current dressing on the skin. Must consume 1 and 1/2 quarts of fluid every 24 hours.

## 2014-08-23 DIAGNOSIS — S81809A Unspecified open wound, unspecified lower leg, initial encounter: Secondary | ICD-10-CM

## 2014-08-23 DIAGNOSIS — S91009A Unspecified open wound, unspecified ankle, initial encounter: Secondary | ICD-10-CM

## 2014-08-23 DIAGNOSIS — S81009A Unspecified open wound, unspecified knee, initial encounter: Secondary | ICD-10-CM | POA: Insufficient documentation

## 2014-08-28 ENCOUNTER — Other Ambulatory Visit: Payer: Self-pay | Admitting: Internal Medicine

## 2014-08-29 ENCOUNTER — Ambulatory Visit (INDEPENDENT_AMBULATORY_CARE_PROVIDER_SITE_OTHER): Payer: Medicare Other | Admitting: Internal Medicine

## 2014-08-29 ENCOUNTER — Encounter: Payer: Self-pay | Admitting: Internal Medicine

## 2014-08-29 VITALS — BP 188/100 | HR 52 | Temp 98.2°F | Resp 16 | Ht 63.0 in | Wt 171.0 lb

## 2014-08-29 DIAGNOSIS — N183 Chronic kidney disease, stage 3 unspecified: Secondary | ICD-10-CM

## 2014-08-29 DIAGNOSIS — I10 Essential (primary) hypertension: Secondary | ICD-10-CM | POA: Diagnosis not present

## 2014-08-29 DIAGNOSIS — L03115 Cellulitis of right lower limb: Secondary | ICD-10-CM | POA: Diagnosis not present

## 2014-08-29 DIAGNOSIS — I872 Venous insufficiency (chronic) (peripheral): Secondary | ICD-10-CM | POA: Diagnosis not present

## 2014-08-29 NOTE — Progress Notes (Signed)
Patient ID: Jennifer Brennan, female   DOB: August 08, 1923, 81 y.o.   MRN: [id number]    Facility  [facility]    Place of Service:   OFFICE   Allergies  Allergen Reactions  . Bactrim [Sulfamethoxazole-Trimethoprim] Other (See Comments)    MD stopped due to kidney failure Also causes lips to swell  . Doxycycline Swelling    Causes lips to swell, wheezing  . Furosemide Other (See Comments)    MD stopped due to kidney failure  . Percocet [Oxycodone-Acetaminophen] Other (See Comments)    Causes SEIZURES  . Valsartan Other (See Comments)    MD stopped due to kidney failure  . Adhesive [Tape]   . Penicillins Hives  . Morphine Sulfate Itching and Rash  . Sertraline Hcl Rash    Chief Complaint  Patient presents with  . Follow-up    1 week follow-up. Right leg wheeping (yellow drainage). Patient still sleeping a lot   . Hypertension    Patient with elevated B/P today, patient's daughter 1/2 Losartan on 08/03/14 due to low B/P    HPI:  Cellulitis of leg, right: Leg wound has been protected and she has worn the Ace wrap to diminish edema. Wound looks very good today. No signs of infection. Nontender.  Chronic venous insufficiency: Creates peripheral edema. Response to compression.  Essential hypertension - significantly higher blood pressure than on previous visits. We'll continue to monitor.  CKD (chronic kidney disease) stage 3, GFR 30-59 ml/min - elevated BUN and creatinine at the time of last visit. Oral intake has improved over the last week. She is a little more alert. Lab follow-up is needed.  Medications: Patient's Medications  New Prescriptions   No medications on file  Previous Medications   ACETAMINOPHEN (TYLENOL) 500 MG TABLET    Take 1,000 mg by mouth every 6 (six) hours as needed for mild pain, fever or headache. pain   BRIMONIDINE (ALPHAGAN) 0.15 % OPHTHALMIC SOLUTION    Place 1 drop into both eyes 2 (two) times daily.   CHOLECALCIFEROL 2000 UNITS TABS    Take 1 tablet by  mouth every morning.   CRANBERRY-VITAMIN C-VITAMIN E PO    Take by mouth. 450 mg Cranberry/ 250 mg Vitamin C   DILTIAZEM 2 % GEL    Apply 1 application topically 2 (two) times daily. Apply to anal fissures. Place a small amount just inside and outside of anus as needed   DORZOLAMIDE-TIMOLOL (COSOPT) 22.3-6.8 MG/ML OPHTHALMIC SOLUTION    Place 1 drop into both eyes 2 (two) times daily.     FLUOCINOLONE ACETONIDE 0.01 % OIL    Use on scalp for psoriasis   FLUTICASONE-SALMETEROL (ADVAIR) 250-50 MCG/DOSE AEPB    Inhale one puff every 12 hours to help breathing   GABAPENTIN (NEURONTIN) 300 MG CAPSULE    take 2 capsules by mouth at bedtime TO HELP PAIN   HYDROCORTISONE (ANUSOL-HC) 25 MG SUPPOSITORY    Place 1 suppository (25 mg total) rectally 2 (two) times daily.   IPRATROPIUM-ALBUTEROL (COMBIVENT RESPIMAT) 20-100 MCG/ACT AERS RESPIMAT    Inhale 2 puffs into the lungs every 6 (six) hours. Take 2 puffs 4 times daily to help with breathing as needed.   LETROZOLE (FEMARA) 2.5 MG TABLET    Take 1 tablet (2.5 mg total) by mouth at bedtime.   LEVETIRACETAM (KEPPRA) 1000 MG TABLET    Take 1/2 tablet by mouth twice daily, in place of 500 mg BID for seizure prevention   LEVOTHYROXINE (SYNTHROID, LEVOTHROID) 88  MCG TABLET    One daily for thyroid supplement   LOSARTAN (COZAAR) 50 MG TABLET    Take 1 tablet daily by mouth for blood pressure.   MEMANTINE HCL ER 28 MG CP24    One daily to help preserve memory   METHADONE (DOLOPHINE) 5 MG TABLET    Take 1/4 by mouth at bedtime.   MULTIPLE VITAMIN (MULTIVITAMIN) TABLET    Centrum Silver:  Take 1 tablet daily   MULTIPLE VITAMINS-MINERALS (DECUBI-VITE) CAPS    Take 1 capsule by mouth daily.   NEOMYCIN-BACITRACIN-POLYMYXIN (NEOSPORIN) OINTMENT    Apply 1 application topically 3 (three) times daily.    OMEPRAZOLE (PRILOSEC) 20 MG CAPSULE    Take 20 mg by mouth every other day. Take 1 tablet every other day to reduce stomach acids.   PILOCARPINE (PILOCAR) 1 % OPHTHALMIC  SOLUTION    Place 1 drop into both eyes 2 (two) times daily.    POLYETHYL GLYCOL-PROPYL GLYCOL (SYSTANE) 0.4-0.3 % GEL    Apply 1 drop to eye 2 (two) times daily as needed.   POLYETHYLENE GLYCOL POWDER (GLYCOLAX) POWDER    17 gram in 4-8 oz fluid daily. Adjust as needed to achieve 1-2 soft stools daily.   PROAIR HFA 108 (90 BASE) MCG/ACT INHALER    inhale 2 puffs by mouth every 6 hours if needed for wheezing   PROBIOTIC PRODUCT (ALIGN) 4 MG CAPS    Take by mouth. Take 1 capsule daily to help irritable colon. Only while on antibiotics.   TRIAMCINOLONE CREAM (KENALOG) 0.1 %    APPLY TO AFFECTED AREA TWICE A DAY TO RASH UNTIL IT IS RESOLVED   WARFARIN (COUMADIN) 5 MG TABLET    Take 1/2 tablet (2.5mg ) daily except 1 tablet (5mg ) on Mondays, Wednesdays and Fridays  Modified Medications   No medications on file  Discontinued Medications   CIPROFLOXACIN (CIPRO) 500 MG TABLET    One twice daily for infection   DIPHENHYDRAMINE (BENADRYL) 25 MG TABLET    Take 25 mg by mouth every 6 (six) hours as needed. 1/2 as needed     Review of Systems  Constitutional: Positive for fatigue. Negative for fever, diaphoresis, activity change, appetite change and unexpected weight change.  HENT: Positive for hearing loss. Negative for congestion and ear pain.   Eyes: Positive for visual disturbance.       Nearly blind bilaterally.  Respiratory: Negative for apnea, cough, choking, chest tightness, shortness of breath and wheezing.   Cardiovascular: Positive for leg swelling. Negative for chest pain and palpitations.  Gastrointestinal: Negative for abdominal pain and abdominal distention.  Endocrine: Negative for cold intolerance, polydipsia, polyphagia and polyuria.  Genitourinary: Positive for frequency. Negative for decreased urine volume, difficulty urinating and pelvic pain.       Urinary incontinence.  Musculoskeletal: Positive for myalgias, arthralgias and gait problem. Negative for joint swelling, neck pain and  neck stiffness.       History of back and leg pains.  Skin:       Resolved cellulitis of the right lower leg. Residual erythema of leg. Wound from skin tear of the medial right leg.  Allergic/Immunologic: Negative.   Neurological: Positive for weakness. Negative for dizziness, tremors, seizures, syncope, facial asymmetry, speech difficulty, light-headedness, numbness and headaches.  Hematological: Negative.   Psychiatric/Behavioral: Positive for confusion, sleep disturbance, decreased concentration and agitation. Negative for suicidal ideas, hallucinations and behavioral problems. The patient is not nervous/anxious and is not hyperactive.     Filed Vitals:  08/29/14 1545 08/29/14 1639 08/29/14 1640  BP: 188/100  188/100  Pulse: 52    Temp: 98.2 F (36.8 C)    TempSrc: Oral    Resp: 16    Height: 5\' 3"  (1.6 m)    Weight: 171 lb (77.565 kg)    SpO2: 89% 96%    Body mass index is 30.3 kg/(m^2).  Physical Exam  Constitutional: No distress.  HENT:  Head: Normocephalic and atraumatic.  Diminished hearing bilaterally. Exposure of root of the lower left canine.  Eyes: Conjunctivae and EOM are normal. Left eye exhibits no discharge.   Nearly blind bilaterally  Neck: Normal range of motion. Neck supple. No JVD present. No tracheal deviation present. No thyromegaly present.  Cardiovascular: Normal rate, regular rhythm and normal heart sounds.  Exam reveals no gallop and no friction rub.   No murmur heard. Pulmonary/Chest: Effort normal and breath sounds normal. No respiratory distress. She has no wheezes. She has no rales.  Abdominal: Bowel sounds are normal. She exhibits no distension and no mass. There is no tenderness.  Musculoskeletal: Normal range of motion. She exhibits edema and tenderness.  Tender at both knees. Tender in the right groin area.  Lymphadenopathy:    She has no cervical adenopathy.  Neurological: She is alert. No cranial nerve deficit. Coordination normal.    Memory deficit.  Skin: Skin is warm and dry. No rash noted. She is not diaphoretic. No erythema. No pallor.  Open skin tear right medial lower leg  Psychiatric:  Significant dementia. Calm at our office today.. Improvement episodes of agitation at home.     Labs reviewed: Office Visit on 08/21/2014  Component Date Value Ref Range Status  . INR 08/21/2014 1.9   Final   No missed doses, no bleeding  . WBC 08/21/2014 7.9  3.4 - 10.8 x10E3/uL Final  . RBC 08/21/2014 4.93  3.77 - 5.28 x10E6/uL Final  . Hemoglobin 08/21/2014 12.9  11.1 - 15.9 g/dL Final  . HCT 08/21/2014 37.7  34.0 - 46.6 % Final  . MCV 08/21/2014 77* 79 - 97 fL Final  . MCH 08/21/2014 26.2* 26.6 - 33.0 pg Final  . MCHC 08/21/2014 34.2  31.5 - 35.7 g/dL Final  . RDW 08/21/2014 14.5  12.3 - 15.4 % Final  . Platelets 08/21/2014 226  150 - 379 x10E3/uL Final  . Neutrophils Relative % 08/21/2014 63   Final  . Lymphs 08/21/2014 27   Final  . Monocytes 08/21/2014 8   Final  . Eos 08/21/2014 2   Final  . Basos 08/21/2014 0   Final  . Neutrophils Absolute 08/21/2014 5.0  1.4 - 7.0 x10E3/uL Final  . Lymphocytes Absolute 08/21/2014 2.1  0.7 - 3.1 x10E3/uL Final  . Monocytes Absolute 08/21/2014 0.7  0.1 - 0.9 x10E3/uL Final  . Eosinophils Absolute 08/21/2014 0.1  0.0 - 0.4 x10E3/uL Final  . Basophils Absolute 08/21/2014 0.0  0.0 - 0.2 x10E3/uL Final  . Glucose 08/21/2014 127* 65 - 99 mg/dL Final  . BUN 08/21/2014 21  8 - 27 mg/dL Final  . Creatinine, Ser 08/21/2014 2.06* 0.57 - 1.00 mg/dL Final  . GFR calc non Af Amer 08/21/2014 21* >59 mL/min/1.73 Final  . GFR calc Af Amer 08/21/2014 24* >59 mL/min/1.73 Final  . BUN/Creatinine Ratio 08/21/2014 10* 11 - 26 Final  . Sodium 08/21/2014 139  134 - 144 mmol/L Final  . Potassium 08/21/2014 4.1  3.5 - 5.2 mmol/L Final  . Chloride 08/21/2014 100  96 - 108  mmol/L Final  . CO2 08/21/2014 29  18 - 29 mmol/L Final  . Calcium 08/21/2014 8.8  8.7 - 10.3 mg/dL Final  Office Visit on  08/14/2014  Component Date Value Ref Range Status  . INR 08/14/2014 1.7   Final  Office Visit on 07/10/2014  Component Date Value Ref Range Status  . INR 07/10/2014 2.0   Final  Office Visit on 06/19/2014  Component Date Value Ref Range Status  . INR 06/19/2014 1.7   Final  Office Visit on 06/05/2014  Component Date Value Ref Range Status  . INR 06/05/2014 4.2   Final     Assessment/Plan 1. Cellulitis of leg, right Resolved. Continues with a healing skin tear.  2. Chronic venous insufficiency Response to Ace wrap  3. Essential hypertension Significantly higher today. - Basic metabolic panel; Future - Basic metabolic panel  4. CKD (chronic kidney disease) stage 3, GFR 30-59 ml/min Follow-up from last visit. Should be improved because of improved intake. - Basic metabolic panel; Future - Basic metabolic panel

## 2014-08-30 LAB — BASIC METABOLIC PANEL
BUN / CREAT RATIO: 11 (ref 11–26)
BUN: 22 mg/dL (ref 8–27)
CO2: 29 mmol/L (ref 18–29)
Calcium: 9.5 mg/dL (ref 8.7–10.3)
Chloride: 101 mmol/L (ref 97–108)
Creatinine, Ser: 1.97 mg/dL — ABNORMAL HIGH (ref 0.57–1.00)
GFR, EST AFRICAN AMERICAN: 26 mL/min/{1.73_m2} — AB (ref >59)
GFR, EST NON AFRICAN AMERICAN: 22 mL/min/{1.73_m2} — AB (ref >59)
GLUCOSE: 80 mg/dL (ref 65–99)
POTASSIUM: 4.5 mmol/L (ref 3.5–5.2)
SODIUM: 143 mmol/L (ref 134–144)

## 2014-09-06 ENCOUNTER — Ambulatory Visit (INDEPENDENT_AMBULATORY_CARE_PROVIDER_SITE_OTHER): Payer: Medicare Other | Admitting: Internal Medicine

## 2014-09-06 ENCOUNTER — Encounter: Payer: Self-pay | Admitting: Internal Medicine

## 2014-09-06 VITALS — BP 136/82 | HR 62 | Temp 97.7°F | Ht 63.0 in

## 2014-09-06 DIAGNOSIS — L409 Psoriasis, unspecified: Secondary | ICD-10-CM

## 2014-09-06 DIAGNOSIS — R531 Weakness: Secondary | ICD-10-CM | POA: Diagnosis not present

## 2014-09-06 DIAGNOSIS — L210 Seborrhea capitis: Secondary | ICD-10-CM

## 2014-09-06 DIAGNOSIS — L03115 Cellulitis of right lower limb: Secondary | ICD-10-CM

## 2014-09-06 DIAGNOSIS — N183 Chronic kidney disease, stage 3 unspecified: Secondary | ICD-10-CM

## 2014-09-06 DIAGNOSIS — I872 Venous insufficiency (chronic) (peripheral): Secondary | ICD-10-CM | POA: Diagnosis not present

## 2014-09-06 MED ORDER — FLUOCINOLONE ACETONIDE 0.01 % OT OIL: TOPICAL_OIL | OTIC | Status: DC

## 2014-09-06 NOTE — Patient Instructions (Signed)
Change dressing weekly.

## 2014-09-06 NOTE — Progress Notes (Signed)
Patient ID: Jennifer Brennan, female   DOB: [birth year], 81 y.o.   MRN: [id number]    Facility  [facility]    Place of Service:   OFFICE   Allergies  Allergen Reactions  . Bactrim [Sulfamethoxazole-Trimethoprim] Other (See Comments)    MD stopped due to kidney failure Also causes lips to swell  . Doxycycline Swelling    Causes lips to swell, wheezing  . Furosemide Other (See Comments)    MD stopped due to kidney failure  . Percocet [Oxycodone-Acetaminophen] Other (See Comments)    Causes SEIZURES  . Valsartan Other (See Comments)    MD stopped due to kidney failure  . Adhesive [Tape]   . Penicillins Hives  . Morphine Sulfate Itching and Rash  . Sertraline Hcl Rash    Chief Complaint  Patient presents with  . Medical Management of Chronic Issues    1 Week Follow up for Cellulitis Right Leg    HPI:  Cellulitis of leg, right: Cellulitis is cleared up. There remains a 1 x 2 half-inch area of denuded skin. This is healing and gradually and in an expected fashion. There is no tenderness or erythema. No purulence. We're keeping the right leg wrapped with an Ace wrap in order to keep the edema down.  Chronic venous insufficiency: Reasonably well-controlled with Ace wrap  CKD (chronic kidney disease) stage 3, GFR 30-59 ml/min: Normal BUN and a slight improvement in the creatinine was dropped a little bit on the last lab.  Weakness generalized: Improved. Eating better. Drinking more.  Seborrhea capitis/ psoriasis of scalp: Daughter says that her beautician will now apply the steroid medication to scalp as had been recommended Namenda in the past.    Medications: Patient's Medications  New Prescriptions   No medications on file  Previous Medications   ACETAMINOPHEN (TYLENOL) 500 MG TABLET    Take 1,000 mg by mouth every 6 (six) hours as needed for mild pain, fever or headache. pain   BRIMONIDINE (ALPHAGAN) 0.15 % OPHTHALMIC SOLUTION    Place 1 drop into both eyes 2 (two) times daily.     CHOLECALCIFEROL 2000 UNITS TABS    Take 1 tablet by mouth every morning.   CRANBERRY-VITAMIN C-VITAMIN E PO    Take by mouth. 450 mg Cranberry/ 250 mg Vitamin C   DILTIAZEM 2 % GEL    Apply 1 application topically 2 (two) times daily. Apply to anal fissures. Place a small amount just inside and outside of anus as needed   DORZOLAMIDE-TIMOLOL (COSOPT) 22.3-6.8 MG/ML OPHTHALMIC SOLUTION    Place 1 drop into both eyes 2 (two) times daily.     FLUOCINOLONE ACETONIDE 0.01 % OIL    Use on scalp for psoriasis   FLUTICASONE-SALMETEROL (ADVAIR) 250-50 MCG/DOSE AEPB    Inhale one puff every 12 hours to help breathing   GABAPENTIN (NEURONTIN) 300 MG CAPSULE    take 2 capsules by mouth at bedtime TO HELP PAIN   HYDROCORTISONE (ANUSOL-HC) 25 MG SUPPOSITORY    Place 1 suppository (25 mg total) rectally 2 (two) times daily.   IPRATROPIUM-ALBUTEROL (COMBIVENT RESPIMAT) 20-100 MCG/ACT AERS RESPIMAT    Inhale 2 puffs into the lungs every 6 (six) hours. Take 2 puffs 4 times daily to help with breathing as needed.   LETROZOLE (FEMARA) 2.5 MG TABLET    Take 1 tablet (2.5 mg total) by mouth at bedtime.   LEVETIRACETAM (KEPPRA) 1000 MG TABLET    Take 1/2 tablet by mouth twice daily, in place of  500 mg BID for seizure prevention   LEVOTHYROXINE (SYNTHROID, LEVOTHROID) 88 MCG TABLET    One daily for thyroid supplement   LOSARTAN (COZAAR) 50 MG TABLET    Take 1 tablet daily by mouth for blood pressure.   MEMANTINE HCL ER 28 MG CP24    One daily to help preserve memory   METHADONE (DOLOPHINE) 5 MG TABLET    Take 1/4 by mouth at bedtime.   MULTIPLE VITAMIN (MULTIVITAMIN) TABLET    Centrum Silver:  Take 1 tablet daily   MULTIPLE VITAMINS-MINERALS (DECUBI-VITE) CAPS    Take 1 capsule by mouth daily.   NEOMYCIN-BACITRACIN-POLYMYXIN (NEOSPORIN) OINTMENT    Apply 1 application topically 3 (three) times daily.    OMEPRAZOLE (PRILOSEC) 20 MG CAPSULE    Take 20 mg by mouth every other day. Take 1 tablet every other day to reduce  stomach acids.   PILOCARPINE (PILOCAR) 1 % OPHTHALMIC SOLUTION    Place 1 drop into both eyes 2 (two) times daily.    POLYETHYL GLYCOL-PROPYL GLYCOL (SYSTANE) 0.4-0.3 % GEL    Apply 1 drop to eye 2 (two) times daily as needed.   POLYETHYLENE GLYCOL POWDER (GLYCOLAX) POWDER    17 gram in 4-8 oz fluid daily. Adjust as needed to achieve 1-2 soft stools daily.   PROAIR HFA 108 (90 BASE) MCG/ACT INHALER    inhale 2 puffs by mouth every 6 hours if needed for wheezing   PROBIOTIC PRODUCT (ALIGN) 4 MG CAPS    Take by mouth. Take 1 capsule daily to help irritable colon. Only while on antibiotics.   TRIAMCINOLONE CREAM (KENALOG) 0.1 %    APPLY TO AFFECTED AREA TWICE A DAY TO RASH UNTIL IT IS RESOLVED   WARFARIN (COUMADIN) 5 MG TABLET    Take 1/2 tablet (2.5mg ) daily except 1 tablet (5mg ) on Mondays, Wednesdays and Fridays  Modified Medications   No medications on file  Discontinued Medications   No medications on file     Review of Systems  Constitutional: Positive for fatigue. Negative for fever, diaphoresis, activity change, appetite change and unexpected weight change.  HENT: Positive for hearing loss. Negative for congestion and ear pain.   Eyes: Positive for visual disturbance.       Nearly blind bilaterally.  Respiratory: Negative for apnea, cough, choking, chest tightness, shortness of breath and wheezing.   Cardiovascular: Positive for leg swelling. Negative for chest pain and palpitations.  Gastrointestinal: Negative for abdominal pain and abdominal distention.  Endocrine: Negative for cold intolerance, polydipsia, polyphagia and polyuria.  Genitourinary: Positive for frequency. Negative for decreased urine volume, difficulty urinating and pelvic pain.       Urinary incontinence.  Musculoskeletal: Positive for myalgias, arthralgias and gait problem. Negative for joint swelling, neck pain and neck stiffness.       History of back and leg pains.  Skin:       Resolved cellulitis of the right  lower leg. Residual erythema of leg. Wound from skin tear of the medial right leg. Seborrhea capitis or possibly psoriasis of scalp.  Allergic/Immunologic: Negative.   Neurological: Positive for weakness. Negative for dizziness, tremors, seizures, syncope, facial asymmetry, speech difficulty, light-headedness, numbness and headaches.  Hematological: Negative.   Psychiatric/Behavioral: Positive for confusion, sleep disturbance, decreased concentration and agitation. Negative for suicidal ideas, hallucinations and behavioral problems. The patient is not nervous/anxious and is not hyperactive.     Filed Vitals:   09/06/14 1357  BP: 136/82  Pulse: 62  Temp: 97.7 F (36.5  C)  TempSrc: Oral  Height: 5\' 3"  (1.6 m)   There is no weight on file to calculate BMI.  Physical Exam  Constitutional: No distress.  HENT:  Head: Normocephalic and atraumatic.  Diminished hearing bilaterally. Exposure of root of the lower left canine.  Eyes: Conjunctivae and EOM are normal. Left eye exhibits no discharge.   Nearly blind bilaterally  Neck: Normal range of motion. Neck supple. No JVD present. No tracheal deviation present. No thyromegaly present.  Cardiovascular: Normal rate, regular rhythm and normal heart sounds.  Exam reveals no gallop and no friction rub.   No murmur heard. Pulmonary/Chest: Effort normal and breath sounds normal. No respiratory distress. She has no wheezes. She has no rales.  Abdominal: Bowel sounds are normal. She exhibits no distension and no mass. There is no tenderness.  Musculoskeletal: Normal range of motion. She exhibits edema and tenderness.  Tender at both knees. Tender in the right groin area.  Lymphadenopathy:    She has no cervical adenopathy.  Neurological: She is alert. No cranial nerve deficit. Coordination normal.  Memory deficit.  Skin: Skin is warm and dry. No rash noted. She is not diaphoretic. No erythema. No pallor.  Open skin tear right medial lower leg    Psychiatric:  Significant dementia. Calm at our office today.. Improvement episodes of agitation at home.     Labs reviewed: Office Visit on 08/29/2014  Component Date Value Ref Range Status  . Glucose 08/29/2014 80  65 - 99 mg/dL Final  . BUN 08/29/2014 22  8 - 27 mg/dL Final  . Creatinine, Ser 08/29/2014 1.97* 0.57 - 1.00 mg/dL Final  . GFR calc non Af Amer 08/29/2014 22* >59 mL/min/1.73 Final  . GFR calc Af Amer 08/29/2014 26* >59 mL/min/1.73 Final  . BUN/Creatinine Ratio 08/29/2014 11  11 - 26 Final  . Sodium 08/29/2014 143  134 - 144 mmol/L Final  . Potassium 08/29/2014 4.5  3.5 - 5.2 mmol/L Final  . Chloride 08/29/2014 101  97 - 108 mmol/L Final  . CO2 08/29/2014 29  18 - 29 mmol/L Final  . Calcium 08/29/2014 9.5  8.7 - 10.3 mg/dL Final  Office Visit on 08/21/2014  Component Date Value Ref Range Status  . INR 08/21/2014 1.9   Final   No missed doses, no bleeding  . WBC 08/21/2014 7.9  3.4 - 10.8 x10E3/uL Final  . RBC 08/21/2014 4.93  3.77 - 5.28 x10E6/uL Final  . Hemoglobin 08/21/2014 12.9  11.1 - 15.9 g/dL Final  . HCT 08/21/2014 37.7  34.0 - 46.6 % Final  . MCV 08/21/2014 77* 79 - 97 fL Final  . MCH 08/21/2014 26.2* 26.6 - 33.0 pg Final  . MCHC 08/21/2014 34.2  31.5 - 35.7 g/dL Final  . RDW 08/21/2014 14.5  12.3 - 15.4 % Final  . Platelets 08/21/2014 226  150 - 379 x10E3/uL Final  . Neutrophils Relative % 08/21/2014 63   Final  . Lymphs 08/21/2014 27   Final  . Monocytes 08/21/2014 8   Final  . Eos 08/21/2014 2   Final  . Basos 08/21/2014 0   Final  . Neutrophils Absolute 08/21/2014 5.0  1.4 - 7.0 x10E3/uL Final  . Lymphocytes Absolute 08/21/2014 2.1  0.7 - 3.1 x10E3/uL Final  . Monocytes Absolute 08/21/2014 0.7  0.1 - 0.9 x10E3/uL Final  . Eosinophils Absolute 08/21/2014 0.1  0.0 - 0.4 x10E3/uL Final  . Basophils Absolute 08/21/2014 0.0  0.0 - 0.2 x10E3/uL Final  . Glucose 08/21/2014  127* 65 - 99 mg/dL Final  . BUN 08/21/2014 21  8 - 27 mg/dL Final  .  Creatinine, Ser 08/21/2014 2.06* 0.57 - 1.00 mg/dL Final  . GFR calc non Af Amer 08/21/2014 21* >59 mL/min/1.73 Final  . GFR calc Af Amer 08/21/2014 24* >59 mL/min/1.73 Final  . BUN/Creatinine Ratio 08/21/2014 10* 11 - 26 Final  . Sodium 08/21/2014 139  134 - 144 mmol/L Final  . Potassium 08/21/2014 4.1  3.5 - 5.2 mmol/L Final  . Chloride 08/21/2014 100  96 - 108 mmol/L Final  . CO2 08/21/2014 29  18 - 29 mmol/L Final  . Calcium 08/21/2014 8.8  8.7 - 10.3 mg/dL Final  Office Visit on 08/14/2014  Component Date Value Ref Range Status  . INR 08/14/2014 1.7   Final  Office Visit on 07/10/2014  Component Date Value Ref Range Status  . INR 07/10/2014 2.0   Final  Office Visit on 06/19/2014  Component Date Value Ref Range Status  . INR 06/19/2014 1.7   Final     Assessment/Plan 1. Cellulitis of leg, right -Cellulitis has resolved and left leg wound is healing. We will continue to apply bandages until fully resolved.  2. Chronic venous insufficiency Controlled with Ace wrap  3. CKD (chronic kidney disease) stage 3, GFR 30-59 ml/min Worse than last fall but stable since the last visit to slightly improved. - Basic metabolic panel; Future  4. Weakness generalized Chronic. A little better than last visit.  5. Seborrhea capitis -Fluocinolone acetonide 0.01% oil to scalp 3 times weekly.  6. Psoriasis of scalp - Fluocinolone Acetonide 0.01 % OIL; Use on scalp for psoriasis  Dispense: 20 mL; Refill: 5

## 2014-09-12 ENCOUNTER — Ambulatory Visit (INDEPENDENT_AMBULATORY_CARE_PROVIDER_SITE_OTHER): Payer: Medicare Other | Admitting: Internal Medicine

## 2014-09-12 ENCOUNTER — Ambulatory Visit: Payer: Self-pay | Admitting: Internal Medicine

## 2014-09-12 ENCOUNTER — Encounter: Payer: Self-pay | Admitting: Internal Medicine

## 2014-09-12 VITALS — BP 130/78 | HR 68 | Temp 97.6°F | Resp 20 | Ht 60.0 in | Wt 171.0 lb

## 2014-09-12 DIAGNOSIS — M79606 Pain in leg, unspecified: Secondary | ICD-10-CM

## 2014-09-12 DIAGNOSIS — R5381 Other malaise: Secondary | ICD-10-CM | POA: Diagnosis not present

## 2014-09-12 DIAGNOSIS — G309 Alzheimer's disease, unspecified: Secondary | ICD-10-CM

## 2014-09-12 DIAGNOSIS — L03115 Cellulitis of right lower limb: Secondary | ICD-10-CM

## 2014-09-12 DIAGNOSIS — I10 Essential (primary) hypertension: Secondary | ICD-10-CM

## 2014-09-12 DIAGNOSIS — F0280 Dementia in other diseases classified elsewhere without behavioral disturbance: Secondary | ICD-10-CM

## 2014-09-12 NOTE — Progress Notes (Signed)
Patient ID: Jennifer Brennan, female   DOB: January 26, 1924, 81 y.o.   MRN: [id number]    Facility  [facility]    Place of Service:   OFFICE   Allergies  Allergen Reactions  . Bactrim [Sulfamethoxazole-Trimethoprim] Other (See Comments)    MD stopped due to kidney failure Also causes lips to swell  . Doxycycline Swelling    Causes lips to swell, wheezing  . Furosemide Other (See Comments)    MD stopped due to kidney failure  . Percocet [Oxycodone-Acetaminophen] Other (See Comments)    Causes SEIZURES  . Valsartan Other (See Comments)    MD stopped due to kidney failure  . Adhesive [Tape]   . Penicillins Hives  . Morphine Sulfate Itching and Rash  . Sertraline Hcl Rash    Chief Complaint  Patient presents with  . Acute Visit    Not eating, sleeping alot, and overall feels sick     HPI:  Malaise: patient is here with her daughter and husband. She has complained of feeling terrible. She has had chills. Appetite has been poor. Daughter is worried that there are new areas of skin breakdown under a roll of skin the right lower abdomen and at the left groin.  Pain of lower extremity, unspecified laterality: Mild increase in discomfort particularly in the right leg.  Hypertension controlled  Alzheimer's disease: Unchanged.  Cellulitis of leg, right: Improved. No new inflammation. Some increase in discomfort in the right leg.    Medications: Patient's Medications  New Prescriptions   No medications on file  Previous Medications   ACETAMINOPHEN (TYLENOL) 500 MG TABLET    Take 1,000 mg by mouth every 6 (six) hours as needed for mild pain, fever or headache. pain   BRIMONIDINE (ALPHAGAN) 0.15 % OPHTHALMIC SOLUTION    Place 1 drop into both eyes 2 (two) times daily.   CHOLECALCIFEROL 2000 UNITS TABS    Take 1 tablet by mouth every morning.   CRANBERRY-VITAMIN C-VITAMIN E PO    Take by mouth. 450 mg Cranberry/ 250 mg Vitamin C   DILTIAZEM 2 % GEL    Apply 1 application topically 2 (two)  times daily. Apply to anal fissures. Place a small amount just inside and outside of anus as needed   DORZOLAMIDE-TIMOLOL (COSOPT) 22.3-6.8 MG/ML OPHTHALMIC SOLUTION    Place 1 drop into both eyes 2 (two) times daily.     FLUOCINOLONE ACETONIDE 0.01 % OIL    Use on scalp for psoriasis   FLUTICASONE-SALMETEROL (ADVAIR) 250-50 MCG/DOSE AEPB    Inhale one puff every 12 hours to help breathing   GABAPENTIN (NEURONTIN) 300 MG CAPSULE    take 2 capsules by mouth at bedtime TO HELP PAIN   HYDROCORTISONE (ANUSOL-HC) 25 MG SUPPOSITORY    Place 1 suppository (25 mg total) rectally 2 (two) times daily.   IPRATROPIUM-ALBUTEROL (COMBIVENT RESPIMAT) 20-100 MCG/ACT AERS RESPIMAT    Inhale 2 puffs into the lungs every 6 (six) hours. Take 2 puffs 4 times daily to help with breathing as needed.   LETROZOLE (FEMARA) 2.5 MG TABLET    Take 1 tablet (2.5 mg total) by mouth at bedtime.   LEVETIRACETAM (KEPPRA) 1000 MG TABLET    Take 1/2 tablet by mouth twice daily, in place of 500 mg BID for seizure prevention   LEVOTHYROXINE (SYNTHROID, LEVOTHROID) 88 MCG TABLET    One daily for thyroid supplement   LOSARTAN (COZAAR) 50 MG TABLET    Take 1 tablet daily by mouth for blood pressure.  MEMANTINE HCL ER 28 MG CP24    One daily to help preserve memory   METHADONE (DOLOPHINE) 5 MG TABLET    Take 1/4 by mouth at bedtime.   MULTIPLE VITAMIN (MULTIVITAMIN) TABLET    Centrum Silver:  Take 1 tablet daily   MULTIPLE VITAMINS-MINERALS (DECUBI-VITE) CAPS    Take 1 capsule by mouth daily.   NEOMYCIN-BACITRACIN-POLYMYXIN (NEOSPORIN) OINTMENT    Apply 1 application topically 3 (three) times daily.    OMEPRAZOLE (PRILOSEC) 20 MG CAPSULE    Take 20 mg by mouth every other day. Take 1 tablet every other day to reduce stomach acids.   PILOCARPINE (PILOCAR) 1 % OPHTHALMIC SOLUTION    Place 1 drop into both eyes 2 (two) times daily.    POLYETHYL GLYCOL-PROPYL GLYCOL (SYSTANE) 0.4-0.3 % GEL    Apply 1 drop to eye 2 (two) times daily as needed.    POLYETHYLENE GLYCOL POWDER (GLYCOLAX) POWDER    17 gram in 4-8 oz fluid daily. Adjust as needed to achieve 1-2 soft stools daily.   PROAIR HFA 108 (90 BASE) MCG/ACT INHALER    inhale 2 puffs by mouth every 6 hours if needed for wheezing   PROBIOTIC PRODUCT (ALIGN) 4 MG CAPS    Take by mouth. Take 1 capsule daily to help irritable colon. Only while on antibiotics.   TRIAMCINOLONE CREAM (KENALOG) 0.1 %    APPLY TO AFFECTED AREA TWICE A DAY TO RASH UNTIL IT IS RESOLVED   WARFARIN (COUMADIN) 5 MG TABLET    Take 1/2 tablet (2.5mg ) daily except 1 tablet (5mg ) on Mondays, Wednesdays and Fridays  Modified Medications   No medications on file  Discontinued Medications   No medications on file     Review of Systems  Constitutional: Positive for chills, activity change and fatigue. Negative for fever, diaphoresis, appetite change and unexpected weight change.  HENT: Positive for hearing loss. Negative for congestion and ear pain.   Eyes: Positive for visual disturbance.       Nearly blind bilaterally.  Respiratory: Negative for apnea, cough, choking, chest tightness, shortness of breath and wheezing.   Cardiovascular: Positive for leg swelling. Negative for chest pain and palpitations.  Gastrointestinal: Negative for abdominal pain and abdominal distention.  Endocrine: Negative for cold intolerance, polydipsia, polyphagia and polyuria.  Genitourinary: Positive for frequency. Negative for decreased urine volume, difficulty urinating and pelvic pain.       Urinary incontinence.  Musculoskeletal: Positive for myalgias, arthralgias and gait problem. Negative for joint swelling, neck pain and neck stiffness.       History of back and leg pains.  Skin:       Resolved cellulitis of the right lower leg. Residual erythema of leg. Wound from skin tear of the medial right leg. Seborrhea capitis or possibly psoriasis of scalp.  Allergic/Immunologic: Negative.   Neurological: Positive for seizures (Iast  seizure in 2013) and weakness. Negative for dizziness, tremors, syncope, facial asymmetry, speech difficulty, light-headedness, numbness and headaches.       Significant short-term memory loss.  Hematological: Negative.   Psychiatric/Behavioral: Positive for confusion, sleep disturbance, decreased concentration and agitation. Negative for suicidal ideas, hallucinations and behavioral problems. The patient is not nervous/anxious and is not hyperactive.     Filed Vitals:   09/12/14 1601  BP: 130/78  Pulse: 68  Temp: 97.6 F (36.4 C)  TempSrc: Oral  Resp: 20  Height: 5' (1.524 m)  Weight: 171 lb (77.565 kg)  SpO2: 96%   Body mass index  is 33.4 kg/(m^2).  Physical Exam  Constitutional: No distress.  HENT:  Head: Normocephalic and atraumatic.  Diminished hearing bilaterally. Exposure of root of the lower left canine.  Eyes: Conjunctivae and EOM are normal. Left eye exhibits no discharge.   Nearly blind bilaterally  Neck: Normal range of motion. Neck supple. No JVD present. No tracheal deviation present. No thyromegaly present.  Cardiovascular: Normal rate, regular rhythm and normal heart sounds.  Exam reveals no gallop and no friction rub.   No murmur heard. Pulmonary/Chest: Effort normal and breath sounds normal. No respiratory distress. She has no wheezes. She has no rales.  Abdominal: Bowel sounds are normal. She exhibits no distension and no mass. There is no tenderness.  Musculoskeletal: Normal range of motion. She exhibits edema and tenderness.  Tender at both knees. Tender in the right groin area.  Lymphadenopathy:    She has no cervical adenopathy.  Neurological: She is alert. No cranial nerve deficit. Coordination normal.  Memory deficit.  Skin: Skin is warm and dry. No rash noted. She is not diaphoretic. No erythema. No pallor.  Open skin tear right medial lower leg  Psychiatric:  Significant dementia. Calm at our office today. Improvement episodes in of agitation at  home.     Labs reviewed: Office Visit on 08/29/2014  Component Date Value Ref Range Status  . Glucose 08/29/2014 80  65 - 99 mg/dL Final  . BUN 08/29/2014 22  8 - 27 mg/dL Final  . Creatinine, Ser 08/29/2014 1.97* 0.57 - 1.00 mg/dL Final  . GFR calc non Af Amer 08/29/2014 22* >59 mL/min/1.73 Final  . GFR calc Af Amer 08/29/2014 26* >59 mL/min/1.73 Final  . BUN/Creatinine Ratio 08/29/2014 11  11 - 26 Final  . Sodium 08/29/2014 143  134 - 144 mmol/L Final  . Potassium 08/29/2014 4.5  3.5 - 5.2 mmol/L Final  . Chloride 08/29/2014 101  97 - 108 mmol/L Final  . CO2 08/29/2014 29  18 - 29 mmol/L Final  . Calcium 08/29/2014 9.5  8.7 - 10.3 mg/dL Final  Office Visit on 08/21/2014  Component Date Value Ref Range Status  . INR 08/21/2014 1.9   Final   No missed doses, no bleeding  . WBC 08/21/2014 7.9  3.4 - 10.8 x10E3/uL Final  . RBC 08/21/2014 4.93  3.77 - 5.28 x10E6/uL Final  . Hemoglobin 08/21/2014 12.9  11.1 - 15.9 g/dL Final  . HCT 08/21/2014 37.7  34.0 - 46.6 % Final  . MCV 08/21/2014 77* 79 - 97 fL Final  . MCH 08/21/2014 26.2* 26.6 - 33.0 pg Final  . MCHC 08/21/2014 34.2  31.5 - 35.7 g/dL Final  . RDW 08/21/2014 14.5  12.3 - 15.4 % Final  . Platelets 08/21/2014 226  150 - 379 x10E3/uL Final  . Neutrophils Relative % 08/21/2014 63   Final  . Lymphs 08/21/2014 27   Final  . Monocytes 08/21/2014 8   Final  . Eos 08/21/2014 2   Final  . Basos 08/21/2014 0   Final  . Neutrophils Absolute 08/21/2014 5.0  1.4 - 7.0 x10E3/uL Final  . Lymphocytes Absolute 08/21/2014 2.1  0.7 - 3.1 x10E3/uL Final  . Monocytes Absolute 08/21/2014 0.7  0.1 - 0.9 x10E3/uL Final  . Eosinophils Absolute 08/21/2014 0.1  0.0 - 0.4 x10E3/uL Final  . Basophils Absolute 08/21/2014 0.0  0.0 - 0.2 x10E3/uL Final  . Glucose 08/21/2014 127* 65 - 99 mg/dL Final  . BUN 08/21/2014 21  8 - 27 mg/dL Final  .  Creatinine, Ser 08/21/2014 2.06* 0.57 - 1.00 mg/dL Final  . GFR calc non Af Amer 08/21/2014 21* >59 mL/min/1.73  Final  . GFR calc Af Amer 08/21/2014 24* >59 mL/min/1.73 Final  . BUN/Creatinine Ratio 08/21/2014 10* 11 - 26 Final  . Sodium 08/21/2014 139  134 - 144 mmol/L Final  . Potassium 08/21/2014 4.1  3.5 - 5.2 mmol/L Final  . Chloride 08/21/2014 100  96 - 108 mmol/L Final  . CO2 08/21/2014 29  18 - 29 mmol/L Final  . Calcium 08/21/2014 8.8  8.7 - 10.3 mg/dL Final  Office Visit on 08/14/2014  Component Date Value Ref Range Status  . INR 08/14/2014 1.7   Final  Office Visit on 07/10/2014  Component Date Value Ref Range Status  . INR 07/10/2014 2.0   Final  Office Visit on 06/19/2014  Component Date Value Ref Range Status  . INR 06/19/2014 1.7   Final     Assessment/Plan 1. Malaise There is really not much I can point to in regards to a specific etiology for her current complaints of not feeling well.  I have elected to hold several medications in case they're part of the problem: Losartan, memantine, multivitamins, omeprazole, and cranberry-vitamin C-vitamin E. She is already off methadone.  She will return in 2 weeks for reassessment.  2. Pain of lower extremity, unspecified laterality I believe this is an extension of her chronic complaints of discomfort in her legs. I do not see any new inflammatory changes or increase in swelling.  3. Hypertension Controlled  4. Alzheimer's disease Unchanged  5. Cellulitis of leg, right Resolved. Still has a small open area of the right calf medially.

## 2014-09-12 NOTE — Patient Instructions (Signed)
Hold the following medications. Do not give: Cranberry-Vit C- Vit E, Cozaar, Memantine, Multivitamins, omeprazole.  Do not give the methadone unless in pain.  Reduce the laxative by half.

## 2014-09-14 ENCOUNTER — Telehealth: Payer: Self-pay | Admitting: *Deleted

## 2014-09-14 NOTE — Telephone Encounter (Signed)
Blanch Media Notified and agreed.

## 2014-09-14 NOTE — Telephone Encounter (Signed)
Daughter, Blanch Media called and wanted to know if she can give her mother 1/2 BP tablet. Stated that BP yesterday was 169/71, Pulse 64, SPO2: 97%. Patient not feeling too good this morning, her BP was 179/75, Pulse 58, T 96.1 and SPO2: 97%. Patient is complaining about another area on her behind that the skin is coming off. Should she start another antibiotic to?  Please Advise.

## 2014-09-14 NOTE — Telephone Encounter (Signed)
It is okay to start one half tablet of her blood pressure medication. I would not use an antibiotic on the buttocks area yet. Cleanse daily with soap and water and apply a dry dressing. If the wound is small enough to cover with one of the bandages that I gave her, she could use that and just watch it through the plastic.

## 2014-09-18 ENCOUNTER — Ambulatory Visit: Payer: Medicare Other | Admitting: Pharmacotherapy

## 2014-09-18 NOTE — Telephone Encounter (Signed)
Patient's daughter called at 11:00 and stated the patient was to weak to come in.

## 2014-09-19 ENCOUNTER — Telehealth: Payer: Self-pay | Admitting: *Deleted

## 2014-09-19 NOTE — Telephone Encounter (Signed)
Blanch Media, Daughter Notified and will stop the medication today.

## 2014-09-19 NOTE — Telephone Encounter (Signed)
Try stopping the gabapentin. Drowsiness can be a side effect of this drug.

## 2014-09-19 NOTE — Telephone Encounter (Signed)
Daughter, Jennifer Brennan called and stated that patient is weak and sleeping all day. BP 154/59, Pulse 65, Temp. 94-95.1. Stated that patient urinated yesterday morning and was a lot but didn't go through out the day. Daughter is worried about the sleeping all day. Please Advise.

## 2014-09-23 ENCOUNTER — Telehealth: Payer: Self-pay | Admitting: Internal Medicine

## 2014-09-23 ENCOUNTER — Other Ambulatory Visit: Payer: Self-pay | Admitting: Internal Medicine

## 2014-09-23 DIAGNOSIS — C50912 Malignant neoplasm of unspecified site of left female breast: Secondary | ICD-10-CM

## 2014-09-23 DIAGNOSIS — R531 Weakness: Secondary | ICD-10-CM

## 2014-09-23 DIAGNOSIS — I872 Venous insufficiency (chronic) (peripheral): Secondary | ICD-10-CM

## 2014-09-23 DIAGNOSIS — H543 Unqualified visual loss, both eyes: Secondary | ICD-10-CM

## 2014-09-23 DIAGNOSIS — E46 Unspecified protein-calorie malnutrition: Secondary | ICD-10-CM

## 2014-09-23 DIAGNOSIS — I679 Cerebrovascular disease, unspecified: Secondary | ICD-10-CM

## 2014-09-23 NOTE — Telephone Encounter (Signed)
Received a call from [place] today. Mom is really not doing well.  She improved briefly end of last week when her gabapentin and several other meds were held by Dr. Nyoka Cowden.  She really rallied and was awake, conversive for about a day, but she has been nauseated, not eating, very weak and unable to get oob.  Her venous ulcer of her leg is bleeding.  She is opposed to going to the hospital.  Blanch Media asked about getting her IVF, but said her mom does not want to go to the hospital.  Blanch Media asked me if I thought she was trying to "pass over".  I said that this could well be upon reviewing recent events.  She has been through a lot including breast cancer, stroke, blindness, pressure and venous ulcers, and, of course, dementia.  She is very frail and dependent in ADLs.  [place] records show no indication of living will/hcpoa on file.  Her code status has been full code.  Blanch Media admits she just wants to keep her on earth, but does understand that she does not want to be hospitalized and it may be "her time".  She agrees to a hospice referral to help her address this.  I will place a referral to [facility].  Phone call made to answering service and message to be relayed to on call physician or nurse to return my call.

## 2014-09-26 ENCOUNTER — Telehealth: Payer: Self-pay | Admitting: *Deleted

## 2014-09-26 NOTE — Telephone Encounter (Signed)
Juliann Pulse with Hospice called and stated that Dr. Mariea Clonts referred patient to Hospice Services over the weekend. They went out to evaluate patient Sunday and the family wants Dr. Nyoka Cowden as their attending Dr. Nurse is in the home. Verbal given for Dr. Nyoka Cowden to be attending provider. Husband is still discussing Hospice services with Family.

## 2014-09-26 NOTE — Telephone Encounter (Signed)
I'm fine to be the attending physician for Mrs. Agan.r

## 2014-09-26 NOTE — Telephone Encounter (Signed)
Verbal orders given to [facility] with [facility] [phone] for DNR for family to sign. Family is consenting to this. Verbal also given for Protime to be drawn at first visit with Hospice nurse.

## 2014-09-27 ENCOUNTER — Ambulatory Visit: Payer: Medicare Other | Admitting: Internal Medicine

## 2014-09-27 ENCOUNTER — Telehealth: Payer: Self-pay

## 2014-09-27 NOTE — Telephone Encounter (Signed)
Jennifer Brennan from Hospice called indicating PT/INR 79/6.6. No missed doses. Patient is currently taking 5 mg daily  Please advise

## 2014-09-28 NOTE — Telephone Encounter (Signed)
Received call from [facility] [phone]  PT/INR was rechecked today (7.4), patient held x 1 day, per Dr.Greens instructions.  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

## 2014-09-28 NOTE — Telephone Encounter (Signed)
Discussed Dr.Greens response with Blanch Media. Left message on voicemail for [place] her as well

## 2014-09-28 NOTE — Telephone Encounter (Signed)
Continue to hold warfarin. Repeat INR Monday, 10/02/2014.

## 2014-10-03 ENCOUNTER — Telehealth: Payer: Self-pay

## 2014-10-03 NOTE — Telephone Encounter (Signed)
Left message on voicemail informing Lois Huxley of Dr.Greens instructions.  I also spoke with Blanch Media (patients daughter), Blanch Media verbalized understanding of results.

## 2014-10-03 NOTE — Telephone Encounter (Signed)
Resume warfarin at 2.5 mg daily. Repeat INR in 1 week.

## 2014-10-03 NOTE — Telephone Encounter (Signed)
PT/INR was 2.8, checked on Monday 10/02/14 Patient is currently HOLDING coumadin Please advise

## 2014-10-04 ENCOUNTER — Telehealth: Payer: Self-pay

## 2014-10-04 NOTE — Telephone Encounter (Signed)
Message left on triage voicemail, patient is unable to swallow Keppra pills. Patient needs a liquid form/solution of this medication sent to [place] Please advise   I called and left message informing Hospice Nurse message was received, awaiting response

## 2014-10-05 MED ORDER — LEVETIRACETAM 100 MG/ML PO SOLN: 500.0000 mg | Freq: Two times a day (BID) | ORAL | Status: AC

## 2014-10-05 NOTE — Telephone Encounter (Signed)
Called Hospice Nurse, message was already handled by Hospice doctor.

## 2014-10-05 NOTE — Telephone Encounter (Signed)
Change levetiracetam to 100 mg/ ml.  Disp: 360 ml. Sig: 5 ml by mouth every 12 hours

## 2014-10-10 ENCOUNTER — Telehealth: Payer: Self-pay | Admitting: *Deleted

## 2014-10-10 NOTE — Telephone Encounter (Signed)
Continue 2.5 mg warfarin daily. Repeat INR in 4 weeks.

## 2014-10-10 NOTE — Telephone Encounter (Signed)
Lavina with Hospice called with Protime Results 10/10/2014  INR:  2.5  Current Dose of Coumadin is 2.5mg  daily Sent to Dr. Nyoka Cowden to review and Advise.

## 2014-10-10 NOTE — Telephone Encounter (Signed)
Lavina with Hospice Notified and agreed.

## 2014-10-17 ENCOUNTER — Telehealth: Payer: Self-pay

## 2014-10-17 NOTE — Telephone Encounter (Signed)
Spoke with Blanch Media, patients daughter. Patient was recently switched to [place]- Liquid form (per Hospice request). Blanch Media said they would like to have tablets and liquid form on hand. Patient has times where she is able to swallow and can tolerate tablet.   Blanch Media would like for [place] to complete PA.  I called [phone] to initiate PA. I received paperwork, completed and placed on Dr.Green's ledge for signature.

## 2014-10-18 MED ORDER — LEVETIRACETAM 1000 MG PO TABS: ORAL_TABLET | ORAL | Status: DC

## 2014-10-18 NOTE — Telephone Encounter (Signed)
Keppra Tablet was approved from 09/17/14-10/18/15. Approval letter faxed to pharmacy, patients daughter aware medication approved

## 2014-10-31 ENCOUNTER — Other Ambulatory Visit: Payer: Self-pay

## 2014-10-31 DIAGNOSIS — M79661 Pain in right lower leg: Secondary | ICD-10-CM

## 2014-10-31 DIAGNOSIS — M461 Sacroiliitis, not elsewhere classified: Secondary | ICD-10-CM

## 2014-10-31 MED ORDER — METHADONE HCL 5 MG PO TABS: ORAL_TABLET | ORAL | Status: DC

## 2014-11-06 ENCOUNTER — Encounter: Payer: Self-pay | Admitting: *Deleted

## 2014-11-06 ENCOUNTER — Telehealth: Payer: Self-pay | Admitting: *Deleted

## 2014-11-06 NOTE — Progress Notes (Signed)
This encounter was created in error - please disregard.

## 2014-11-06 NOTE — Telephone Encounter (Signed)
Received a call from Hospice nurse, with the results of PT: 34.7, INR :2.9 . Patient is on 2.5 of coumadin daily.

## 2014-11-06 NOTE — Telephone Encounter (Signed)
Lavina [facility] nurse) she stated that she went out to do her regular coumadin check. Sherrie Mustache read the report and stated that the patient could continue the current dose, and recheck in 2 weeks.

## 2014-11-09 ENCOUNTER — Telehealth: Payer: Self-pay | Admitting: *Deleted

## 2014-11-09 NOTE — Telephone Encounter (Signed)
Daughter called and stated that patient's leg wound is much worse and wants her seen today. Oozing green and blood. And vagina oozing green stuff. Instructed her to take her to urgent care. She agreed.

## 2014-11-13 ENCOUNTER — Telehealth: Payer: Self-pay | Admitting: *Deleted

## 2014-11-13 NOTE — Telephone Encounter (Signed)
Patient daughter stated that since patient went with Hospice her leg wound look bad. Leg has gotten worse and worse. It is now spreading and red. Daughter very concerned and wants to see you tomorrow. Is it ok to work her into your schedule. Please Advise.

## 2014-11-13 NOTE — Telephone Encounter (Signed)
Please work into the schedule.

## 2014-11-13 NOTE — Telephone Encounter (Signed)
Per Dr. Len Blalock 11/14/2014 at 1:00pm.  Daughter notified and very thankful.

## 2014-11-14 ENCOUNTER — Ambulatory Visit: Payer: Medicare Other | Admitting: Internal Medicine

## 2014-11-16 ENCOUNTER — Other Ambulatory Visit: Payer: Self-pay | Admitting: *Deleted

## 2014-11-16 DIAGNOSIS — M461 Sacroiliitis, not elsewhere classified: Secondary | ICD-10-CM

## 2014-11-16 DIAGNOSIS — M79661 Pain in right lower leg: Secondary | ICD-10-CM

## 2014-11-16 MED ORDER — METHADONE HCL 5 MG PO TABS: ORAL_TABLET | ORAL | Status: DC

## 2014-11-16 MED ORDER — METHADONE HCL 5 MG PO TABS: ORAL_TABLET | ORAL | Status: AC

## 2014-11-16 NOTE — Telephone Encounter (Signed)
Patient daughter stated that Hospice has changed the pain medication to three times daily to stay on top of the pain. Corrected and confirmed with Hospice nurse [phone]

## 2014-11-30 ENCOUNTER — Telehealth: Payer: Self-pay

## 2014-11-30 NOTE — Telephone Encounter (Signed)
Patients daughter called stating patient with bleeding in personal area and she is on blood thinner. Patient was examined by hospice nurse and it was clarified that bleeding is coming from vaginal area. Patient's daughter states she called the GYN and was told that's not alarming. Patients daughter was disturbed and thinks someone needs to see her mother today.   I discussed with Dr.Reed, per Dr.Reed patient to be seen at Urgent Care due to the time of call. Patient's daughter verbalized understanding.

## 2014-12-06 ENCOUNTER — Telehealth: Payer: Self-pay | Admitting: *Deleted

## 2014-12-06 NOTE — Telephone Encounter (Signed)
Livina with Hospice called with Protime Results 12/06/2014  INR:  1.2  INR was 5.2 on 12/01/2014 and Dr. Nyoka Cowden held Coumadin Saturday and Sunday and told them to restart Coumadin 2.5mg  on Monday. Caregiver has NOT given Coumadin to patient scared that she would start bleeding. So patient has not had any coumadin. Please Advise. Printed and given to Dr. Eulas Post to review and sign.

## 2014-12-07 NOTE — Telephone Encounter (Signed)
Was informed by [facility]) that she had texted Dr. Nyoka Cowden and he had given a 2.0 mg Coumadin result and that was given before Dr. [place] result. Dr. Rolly Salter orders was used with patient.

## 2014-12-07 NOTE — Telephone Encounter (Signed)
Dr. Eulas Post reviewed the PT/ INR , she resumed the coumadin 2.5 mg po Q PM. Recheck INR on 12/11/2014.

## 2014-12-11 ENCOUNTER — Telehealth: Payer: Self-pay | Admitting: *Deleted

## 2014-12-11 NOTE — Telephone Encounter (Signed)
Livana with Hospice called with Protime Results 12/11/2014  INR:  1.3  Current dose of Coumadin is: 2mg  daily Printed and given to Dr. Mariea Clonts to review

## 2014-12-11 NOTE — Telephone Encounter (Signed)
Per Dr. Sharol Harness Coumadin to 3mg  daily and recheck INR 12/13/2014  Called and [facility] for [place], hospice nurse and also called and spoke with daughter, Blanch Media and she understood and agreed.

## 2014-12-21 ENCOUNTER — Telehealth: Payer: Self-pay | Admitting: *Deleted

## 2014-12-21 LAB — PROTIME-INR

## 2014-12-21 NOTE — Telephone Encounter (Signed)
Per Jessica--Continue same dose of Coumadin and recheck next Wednesday. Patient notified and faxed to [facility].

## 2014-12-21 NOTE — Telephone Encounter (Signed)
Received Protime Results from [facility] 12/21/2014  INR:  2.87  Current Dose of Coumadin is 2mg  alternating with 2.5mg  Previous INR: 1.3. Dx: DVT Given to Janett Billow to review and sign.

## 2015-01-02 ENCOUNTER — Encounter (HOSPITAL_COMMUNITY): Payer: Self-pay | Admitting: Emergency Medicine

## 2015-01-02 ENCOUNTER — Encounter: Payer: Self-pay | Admitting: Internal Medicine

## 2015-01-02 ENCOUNTER — Inpatient Hospital Stay (HOSPITAL_COMMUNITY)
Admission: EM | Admit: 2015-01-02 | Discharge: 2015-01-10 | DRG: 813 | Disposition: A | Discharge status: Hospice | Attending: Internal Medicine | Admitting: Internal Medicine

## 2015-01-02 DIAGNOSIS — H548 Legal blindness, as defined in USA: Secondary | ICD-10-CM | POA: Diagnosis present

## 2015-01-02 DIAGNOSIS — Z96641 Presence of right artificial hip joint: Secondary | ICD-10-CM | POA: Diagnosis present

## 2015-01-02 DIAGNOSIS — E876 Hypokalemia: Secondary | ICD-10-CM | POA: Diagnosis not present

## 2015-01-02 DIAGNOSIS — Z888 Allergy status to other drugs, medicaments and biological substances status: Secondary | ICD-10-CM

## 2015-01-02 DIAGNOSIS — Q2733 Arteriovenous malformation of digestive system vessel: Secondary | ICD-10-CM

## 2015-01-02 DIAGNOSIS — L89152 Pressure ulcer of sacral region, stage 2: Secondary | ICD-10-CM | POA: Diagnosis present

## 2015-01-02 DIAGNOSIS — L89322 Pressure ulcer of left buttock, stage 2: Secondary | ICD-10-CM | POA: Diagnosis present

## 2015-01-02 DIAGNOSIS — Z853 Personal history of malignant neoplasm of breast: Secondary | ICD-10-CM

## 2015-01-02 DIAGNOSIS — M810 Age-related osteoporosis without current pathological fracture: Secondary | ICD-10-CM | POA: Diagnosis present

## 2015-01-02 DIAGNOSIS — R627 Adult failure to thrive: Secondary | ICD-10-CM | POA: Diagnosis present

## 2015-01-02 DIAGNOSIS — K625 Hemorrhage of anus and rectum: Secondary | ICD-10-CM | POA: Diagnosis present

## 2015-01-02 DIAGNOSIS — L409 Psoriasis, unspecified: Secondary | ICD-10-CM | POA: Diagnosis present

## 2015-01-02 DIAGNOSIS — Z79899 Other long term (current) drug therapy: Secondary | ICD-10-CM

## 2015-01-02 DIAGNOSIS — T45511A Poisoning by anticoagulants, accidental (unintentional), initial encounter: Secondary | ICD-10-CM | POA: Diagnosis present

## 2015-01-02 DIAGNOSIS — G8929 Other chronic pain: Secondary | ICD-10-CM | POA: Diagnosis present

## 2015-01-02 DIAGNOSIS — Z885 Allergy status to narcotic agent status: Secondary | ICD-10-CM

## 2015-01-02 DIAGNOSIS — R32 Unspecified urinary incontinence: Secondary | ICD-10-CM | POA: Diagnosis present

## 2015-01-02 DIAGNOSIS — G40909 Epilepsy, unspecified, not intractable, without status epilepticus: Secondary | ICD-10-CM | POA: Diagnosis present

## 2015-01-02 DIAGNOSIS — D72829 Elevated white blood cell count, unspecified: Secondary | ICD-10-CM | POA: Diagnosis not present

## 2015-01-02 DIAGNOSIS — Z7901 Long term (current) use of anticoagulants: Secondary | ICD-10-CM

## 2015-01-02 DIAGNOSIS — K5641 Fecal impaction: Secondary | ICD-10-CM | POA: Diagnosis present

## 2015-01-02 DIAGNOSIS — T45515A Adverse effect of anticoagulants, initial encounter: Secondary | ICD-10-CM | POA: Diagnosis present

## 2015-01-02 DIAGNOSIS — C50912 Malignant neoplasm of unspecified site of left female breast: Secondary | ICD-10-CM | POA: Diagnosis present

## 2015-01-02 DIAGNOSIS — H409 Unspecified glaucoma: Secondary | ICD-10-CM | POA: Diagnosis present

## 2015-01-02 DIAGNOSIS — Z87891 Personal history of nicotine dependence: Secondary | ICD-10-CM

## 2015-01-02 DIAGNOSIS — M461 Sacroiliitis, not elsewhere classified: Secondary | ICD-10-CM

## 2015-01-02 DIAGNOSIS — K921 Melena: Secondary | ICD-10-CM | POA: Diagnosis not present

## 2015-01-02 DIAGNOSIS — Z86718 Personal history of other venous thrombosis and embolism: Secondary | ICD-10-CM

## 2015-01-02 DIAGNOSIS — Z91048 Other nonmedicinal substance allergy status: Secondary | ICD-10-CM

## 2015-01-02 DIAGNOSIS — M79661 Pain in right lower leg: Secondary | ICD-10-CM

## 2015-01-02 DIAGNOSIS — D6832 Hemorrhagic disorder due to extrinsic circulating anticoagulants: Secondary | ICD-10-CM | POA: Diagnosis not present

## 2015-01-02 DIAGNOSIS — K5790 Diverticulosis of intestine, part unspecified, without perforation or abscess without bleeding: Secondary | ICD-10-CM | POA: Diagnosis present

## 2015-01-02 DIAGNOSIS — F0280 Dementia in other diseases classified elsewhere without behavioral disturbance: Secondary | ICD-10-CM | POA: Diagnosis present

## 2015-01-02 DIAGNOSIS — M549 Dorsalgia, unspecified: Secondary | ICD-10-CM | POA: Diagnosis present

## 2015-01-02 DIAGNOSIS — L8990 Pressure ulcer of unspecified site, unspecified stage: Secondary | ICD-10-CM | POA: Diagnosis present

## 2015-01-02 DIAGNOSIS — K922 Gastrointestinal hemorrhage, unspecified: Secondary | ICD-10-CM

## 2015-01-02 DIAGNOSIS — H3582 Retinal ischemia: Secondary | ICD-10-CM | POA: Diagnosis present

## 2015-01-02 DIAGNOSIS — Z880 Allergy status to penicillin: Secondary | ICD-10-CM

## 2015-01-02 DIAGNOSIS — I10 Essential (primary) hypertension: Secondary | ICD-10-CM | POA: Diagnosis present

## 2015-01-02 DIAGNOSIS — I482 Chronic atrial fibrillation: Secondary | ICD-10-CM | POA: Diagnosis present

## 2015-01-02 DIAGNOSIS — E039 Hypothyroidism, unspecified: Secondary | ICD-10-CM | POA: Diagnosis present

## 2015-01-02 DIAGNOSIS — I8393 Asymptomatic varicose veins of bilateral lower extremities: Secondary | ICD-10-CM | POA: Diagnosis present

## 2015-01-02 DIAGNOSIS — E870 Hyperosmolality and hypernatremia: Secondary | ICD-10-CM | POA: Diagnosis present

## 2015-01-02 DIAGNOSIS — K567 Ileus, unspecified: Secondary | ICD-10-CM | POA: Diagnosis not present

## 2015-01-02 DIAGNOSIS — K649 Unspecified hemorrhoids: Secondary | ICD-10-CM | POA: Diagnosis present

## 2015-01-02 DIAGNOSIS — G309 Alzheimer's disease, unspecified: Secondary | ICD-10-CM | POA: Diagnosis present

## 2015-01-02 DIAGNOSIS — N390 Urinary tract infection, site not specified: Secondary | ICD-10-CM

## 2015-01-02 DIAGNOSIS — E860 Dehydration: Secondary | ICD-10-CM | POA: Diagnosis present

## 2015-01-02 DIAGNOSIS — Z8673 Personal history of transient ischemic attack (TIA), and cerebral infarction without residual deficits: Secondary | ICD-10-CM

## 2015-01-02 DIAGNOSIS — Z66 Do not resuscitate: Secondary | ICD-10-CM | POA: Diagnosis present

## 2015-01-02 NOTE — ED Notes (Addendum)
Per EMS- pt is a hospice patient. An hour and a half ago her daughter changed her padding underneath her and noted blood, no stool coming from her rectum. No coffee grounds, just bright red blood. Denies N/V. Abdomen soft to touch. Pt does have alzheimer's but can express needs. Vitals stable. Pt family reports that recently patient was bleeding from her vagina as well.

## 2015-01-02 NOTE — ED Provider Notes (Signed)
CSN: [id number]   Arrival date & time 01/02/15 2346  History  This chart was scribed for  Jacey Pelc, MD by Altamease Oiler, ED Scribe. This patient was seen in room B16C/B16C and the patient's care was started at 11:49 PM.  Chief Complaint  Patient presents with  . GI Bleeding    HPI Patient is a 81 y.o. female presenting with hematochezia. The history is provided by the EMS personnel and a caregiver. No language interpreter was used.  Rectal Bleeding Quality:  Bright red Amount:  Moderate Duration:  1 hour Timing:  Intermittent Progression:  Unchanged Chronicity:  New Context: constipation   Similar prior episodes: no   Relieved by:  Nothing Worsened by:  Nothing tried Ineffective treatments:  None tried Associated symptoms: fever   Associated symptoms: no abdominal pain and no loss of consciousness   Risk factors: anticoagulant use    Jennifer Brennan is a 81 y.o. female on Hospice who presents to the Emergency Department complaining of rectal bleeding with onset 1 hour PTA. Pt is on Coumadin.  Per EMS the patients daughter was changing her padding when she noticed blood. Later she examined the patient's rectum with a flashlight and notes that blood was "pouring out". The pt has not complained of any pain but has stated several times "I don't feel good". Associated symptoms include constipation with small hard stool and fever of 99.5 axillary yesterday that was treated with Tylenol. The patient's daughter notes that she recently had an episode of vaginal bleeding and that she called her OB/GYN who was not concerned.  Past Medical History  Diagnosis Date  . Hypertension   . Hypothyroidism   . Seizure disorder   . Severe stage glaucoma     legally blind  . Depression with anxiety   . OAB (overactive bladder)   . DVT 05/2007    RLE following R THR - chronic coumadin, chronic RLE pain  . GLAUCOMA   . OSTEOPENIA   . Retinal ischemia   . Sciatica of right side     chronic  RLE pain, multifactorial - MRI T/L spine 02/2011  . VITAMIN D DEFICIENCY dx 08/2008  . Venous ulcer of right lower extremity with varicose veins   . Dementia   . Vulvar abscess   . Unspecified hypothyroidism   . Unspecified vitamin D deficiency   . Unspecified urinary incontinence   . Renal disorder   . Varicose veins of lower extremity   . Varicose veins of lower extremities with ulcer   . Unspecified disorder of kidney and ureter   . Stevens-Johnson syndrome   . Sebaceous cyst   . Disorder of bone and cartilage, unspecified   . Contusion of lower leg   . Depression   . Seizures   . Osteoporosis, unspecified 05/12/2013  . Acute asthmatic bronchitis 09/18/2013  . DVT, lower extremity   . ADENOCARCINOMA, LEFT BREAST 04/11/2010    s/p L mastectomy, on femara x 39yr  . OSTEOARTHRITIS, HANDS, BILATERAL   . Arthritis     "back; hands; toes" (09/26/2013)  . Chronic back pain     "upper and lower" (09/26/2013)  . Anxiety   . Blindness of both eyes     "from glaucoma". Also has bilateral occipital lobe infarcts.  . Cerebral atrophy   . Cerebrovascular disease   . Cerebral infarct     left parietal and bilateral occipital lobes    Past Surgical History  Procedure Laterality Date  . Total abdominal  hysterectomy    . Total hip arthroplasty Right 10/2006  . Refractive surgery Bilateral   . Tonsillectomy    . Breast biopsy Left 03/2010  . Mastectomy Left 2011    Family History  Problem Relation Age of Onset  . Ovarian cancer Mother   . Cancer Mother   . Deep vein thrombosis Mother   . Heart disease Mother   . Arthritis Other     grandparents  . Lung cancer Other   . Heart disease Other     parent  . Cancer Father     BRAIN TUMOR  . Cancer Brother   . Hyperlipidemia Brother   . Hypertension Brother   . Stroke Brother   . Arthritis Brother   . Sickle cell anemia Daughter   . Heart disease Daughter   . Sickle cell anemia Daughter     History  Substance Use Topics  .  Smoking status: Former Smoker -- 1.00 packs/day for 45 years    Types: Cigarettes    Quit date: 10/16/1979  . Smokeless tobacco: Never Used  . Alcohol Use: Yes     Comment: 09/26/2013 "drank some years ago"     Review of Systems  Constitutional: Positive for fever.  Gastrointestinal: Positive for constipation and hematochezia. Negative for abdominal pain.  Neurological: Negative for loss of consciousness.  All other systems reviewed and are negative.   Home Medications   Prior to Admission medications   Medication Sig Start Date End Date Taking? Authorizing Provider  acetaminophen (TYLENOL) 500 MG tablet Take 1,000 mg by mouth every 6 (six) hours as needed for mild pain, fever or headache. pain    Historical Provider, MD  brimonidine (ALPHAGAN) 0.15 % ophthalmic solution Place 1 drop into both eyes 2 (two) times daily.    Historical Provider, MD  Cholecalciferol 2000 UNITS TABS Take 1 tablet by mouth every morning.    Historical Provider, MD  CRANBERRY-VITAMIN C-VITAMIN E PO Take by mouth. 450 mg Cranberry/ 250 mg Vitamin C    Historical Provider, MD  diltiazem 2 % GEL Apply 1 application topically 2 (two) times daily. Apply to anal fissures. Place a small amount just inside and outside of anus as needed 09/26/13   Modena Jansky, MD  dorzolamide-timolol (COSOPT) 22.3-6.8 MG/ML ophthalmic solution Place 1 drop into both eyes 2 (two) times daily.      Historical Provider, MD  Fluocinolone Acetonide 0.01 % OIL Use on scalp for psoriasis 09/06/14   Estill Dooms, MD  Fluticasone-Salmeterol (ADVAIR) 250-50 MCG/DOSE AEPB Inhale one puff every 12 hours to help breathing Patient taking differently: Inhale 1 puff into the lungs as needed. Inhale one puff every 12 hours to help breathing 11/08/13   Estill Dooms, MD  gabapentin (NEURONTIN) 300 MG capsule take 2 capsules by mouth at bedtime TO HELP PAIN 08/29/14   Estill Dooms, MD  hydrocortisone (ANUSOL-HC) 25 MG suppository Place 1 suppository (25  mg total) rectally 2 (two) times daily. 04/18/14   Blanchie Serve, MD  Ipratropium-Albuterol (COMBIVENT RESPIMAT) 20-100 MCG/ACT AERS respimat Inhale 2 puffs into the lungs every 6 (six) hours. Take 2 puffs 4 times daily to help with breathing as needed. 08/21/14   Lauree Chandler, NP  letrozole [facility]) 2.5 MG tablet Take 1 tablet (2.5 mg total) by mouth at bedtime. 05/23/14   Chauncey Cruel, MD  levETIRAcetam (KEPPRA) 100 MG/ML solution Take 5 mLs (500 mg total) by mouth every 12 (twelve) hours. 10/05/14  Estill Dooms, MD  levETIRAcetam (KEPPRA) 1000 MG tablet 1/2 by mouth in the am, 1/2 by mouth in the pm for seizure prevention.Take when able to swallow, if patient is not able to swallow patient needs to take liquid form 10/18/14   Estill Dooms, MD  levothyroxine Wilmer Floor, LEVOTHROID) 88 MCG tablet One daily for thyroid supplement 02/08/14   Estill Dooms, MD  losartan (COZAAR) 50 MG tablet Take 1 tablet daily by mouth for blood pressure. Patient taking differently: Take 1/2 tablet daily by mouth for blood pressure. 06/07/14   Estill Dooms, MD  Memantine HCl ER 28 MG CP24 One daily to help preserve memory 02/08/14   Estill Dooms, MD  methadone (DOLOPHINE) 5 MG tablet Take one tablet by mouth three times daily for pain.HOSPICE PATIENT 11/16/14   Lauree Chandler, NP  Multiple Vitamin (MULTIVITAMIN) tablet Centrum Silver:  Take 1 tablet daily    Historical Provider, MD  Multiple Vitamins-Minerals (DECUBI-VITE) CAPS Take 1 capsule by mouth daily. 04/26/14   Blanchie Serve, MD  neomycin-bacitracin-polymyxin (NEOSPORIN) ointment Apply 1 application topically 3 (three) times daily.     Historical Provider, MD  omeprazole (PRILOSEC) 20 MG capsule Take 20 mg by mouth every other day. Take 1 tablet every other day to reduce stomach acids. 07/26/13   Estill Dooms, MD  pilocarpine (PILOCAR) 1 % ophthalmic solution Place 1 drop into both eyes 2 (two) times daily.  08/17/11   Historical Provider, MD   Polyethyl Glycol-Propyl Glycol (SYSTANE) 0.4-0.3 % GEL Apply 1 drop to eye 2 (two) times daily as needed.    Historical Provider, MD  polyethylene glycol powder (GLYCOLAX) powder 17 gram in 4-8 oz fluid daily. Adjust as needed to achieve 1-2 soft stools daily. 03/13/14   [name], RPH-CPP  PROAIR HFA 108 (90 BASE) MCG/ACT inhaler inhale 2 puffs by mouth every 6 hours if needed for wheezing 04/21/14   Lauree Chandler, NP  Probiotic Product (ALIGN) 4 MG CAPS Take by mouth. Take 1 capsule daily to help irritable colon. Only while on antibiotics.    Historical Provider, MD  triamcinolone cream (KENALOG) 0.1 % APPLY TO AFFECTED AREA TWICE A DAY TO RASH UNTIL IT IS RESOLVED 07/28/14   Estill Dooms, MD  warfarin (COUMADIN) 5 MG tablet Take 1/2 tablet (2.5mg ) daily except 1 tablet (5mg ) on Mondays, Wednesdays and Fridays 06/19/14   [name], RPH-CPP    Allergies  Bactrim; Doxycycline; Furosemide; Percocet; Valsartan; Adhesive; Penicillins; Morphine sulfate; and Sertraline hcl  Triage Vitals: BP 125/60 mmHg  Pulse 66  Temp(Src) 97.5 F (36.4 C) (Oral)  Resp 20  SpO2 94%  Physical Exam  Constitutional: She appears well-developed and well-nourished. No distress.  HENT:  Head: Normocephalic and atraumatic.  Moist mucous membranes  Eyes: Conjunctivae and EOM are normal.  Pupil pinpoint   Neck: Neck supple. No tracheal deviation present.  Cardiovascular: Normal rate and regular rhythm.   3+radial pulse  Pulmonary/Chest: Effort normal and breath sounds normal. No respiratory distress. She has no wheezes. She has no rales.  Abdominal: Soft. Bowel sounds are normal. She exhibits no mass. There is no rebound and no guarding.  Stool throughout   Genitourinary:  Stage 1 decubitus ulcer at the perineum Internal and external hemorrhoids An extensive amount of stool in the rectal vault  Neurological: She is alert.  Parkinsonian tremor of left thumb, 3 hertz   Skin: Skin is warm and dry.   Stasis dermatitis on both shins  with no warmth   Psychiatric: She has a normal mood and affect. Her behavior is normal.  Nursing note and vitals reviewed.   ED Course  Procedures   DIAGNOSTIC STUDIES: Oxygen Saturation is 94% on RA, normal by my interpretation.    COORDINATION OF CARE: 11:59 PM Discussed treatment plan which includes abdominal and chest XR and lab work with pt and her daughter at bedside and pt agreed to plan.  1:38 AM-Consult complete with Dr. Sheran Luz. Patient case explained and discussed. He requests inpatient telemetry. Call ended at 1:40 AM.  Labs Review- Labs Reviewed - No data to display  Imaging Review No results found.  EKG Interpretation None      MDM   Final diagnoses:  None    Results for orders placed or performed during the hospital encounter of 01/02/15  Urinalysis, Routine w reflex microscopic (not at [facility])  Result Value Ref Range   Color, Urine AMBER (A) YELLOW   APPearance CLOUDY (A) CLEAR   Specific Gravity, Urine 1.017 1.005 - 1.030   pH 5.0 5.0 - 8.0   Glucose, UA NEGATIVE NEGATIVE mg/dL   Hgb urine dipstick NEGATIVE NEGATIVE   Bilirubin Urine SMALL (A) NEGATIVE   Ketones, ur NEGATIVE NEGATIVE mg/dL   Protein, ur NEGATIVE NEGATIVE mg/dL   Urobilinogen, UA 1.0 0.0 - 1.0 mg/dL   Nitrite POSITIVE (A) NEGATIVE   Leukocytes, UA MODERATE (A) NEGATIVE  CBC with Differential/Platelet  Result Value Ref Range   WBC 14.1 (H) 4.0 - 10.5 K/uL   RBC 5.79 (H) 3.87 - 5.11 MIL/uL   Hemoglobin 15.4 (H) 12.0 - 15.0 g/dL   HCT 47.8 (H) 36.0 - 46.0 %   MCV 82.6 78.0 - 100.0 fL   MCH 26.6 26.0 - 34.0 pg   MCHC 32.2 30.0 - 36.0 g/dL   RDW 15.8 (H) 11.5 - 15.5 %   Platelets 345 150 - 400 K/uL   Neutrophils Relative % 70 43 - 77 %   Neutro Abs 9.9 (H) 1.7 - 7.7 K/uL   Lymphocytes Relative 23 12 - 46 %   Lymphs Abs 3.2 0.7 - 4.0 K/uL   Monocytes Relative 7 3 - 12 %   Monocytes Absolute 1.0 0.1 - 1.0 K/uL   Eosinophils Relative 0 0 - 5  %   Eosinophils Absolute 0.0 0.0 - 0.7 K/uL   Basophils Relative 0 0 - 1 %   Basophils Absolute 0.0 0.0 - 0.1 K/uL  Protime-INR  Result Value Ref Range   Prothrombin Time >90.0 (H) 11.6 - 15.2 seconds   INR >10.00 (HH) 0.00 - 1.49  Urine microscopic-add on  Result Value Ref Range   Squamous Epithelial / LPF RARE RARE   WBC, UA 11-20 <3 WBC/hpf   RBC / HPF 0-2 <3 RBC/hpf   Bacteria, UA MANY (A) RARE   Casts HYALINE CASTS (A) NEGATIVE  POC occult blood, ED Provider will collect  Result Value Ref Range   Fecal Occult Bld POSITIVE (A) NEGATIVE  I-Stat Chem 8, ED  Result Value Ref Range   Sodium 153 (H) 135 - 145 mmol/L   Potassium 3.6 3.5 - 5.1 mmol/L   Chloride 111 101 - 111 mmol/L   BUN 30 (H) 6 - 20 mg/dL   Creatinine, Ser 1.10 (H) 0.44 - 1.00 mg/dL   Glucose, Bld 136 (H) 65 - 99 mg/dL   Calcium, Ion 1.22 1.13 - 1.30 mmol/L   TCO2 26 0 - 100 mmol/L   Hemoglobin 16.7 (  H) 12.0 - 15.0 g/dL   HCT 49.0 (H) 36.0 - 46.0 %  Type and screen for Red Blood Exchange  Result Value Ref Range   ABO/RH(D) O POS    Antibody Screen NEG    Sample Expiration 01/06/2015    Dg Abd Acute W/chest  01/03/2015   CLINICAL DATA:  Bright red blood from rectum on our and a half ago. Constipation.  EXAM: DG ABDOMEN ACUTE W/ 1V CHEST  COMPARISON:  Chest 09/25/2013  FINDINGS: Shallow inspiration. Linear atelectasis in the right mid lung. Normal heart size and pulmonary vascularity. Mild central bronchiectasis with bronchial wall thickening suggesting chronic bronchitis. Calcification of the aorta.  Lumbar scoliosis convex towards the right. Large amount of stool in the rectum. Scattered gas is stool throughout the remainder the colon. No small or large bowel distention. No free intra-abdominal air. No abnormal air-fluid levels. No radiopaque stones.  IMPRESSION: Shallow inspiration with atelectasis in the right mid lung. Chronic bronchitic changes in the lungs.  Nonobstructive bowel gas pattern. Large amount of  stool seen in the rectum.   Electronically Signed   By: Lucienne Capers M.D.   On: 01/03/2015 00:54     I personally performed the services described in this documentation, which was scribed in my presence. The recorded information has been reviewed and is accurate.     EKG Interpretation None         Ah Bott, MD 01/03/15 [phone]

## 2015-01-03 ENCOUNTER — Encounter (HOSPITAL_COMMUNITY): Payer: Self-pay | Admitting: Emergency Medicine

## 2015-01-03 ENCOUNTER — Emergency Department (HOSPITAL_COMMUNITY)

## 2015-01-03 DIAGNOSIS — H409 Unspecified glaucoma: Secondary | ICD-10-CM | POA: Diagnosis present

## 2015-01-03 DIAGNOSIS — L89152 Pressure ulcer of sacral region, stage 2: Secondary | ICD-10-CM | POA: Diagnosis present

## 2015-01-03 DIAGNOSIS — K5790 Diverticulosis of intestine, part unspecified, without perforation or abscess without bleeding: Secondary | ICD-10-CM | POA: Diagnosis present

## 2015-01-03 DIAGNOSIS — T604X1A Toxic effect of rodenticides, accidental (unintentional), initial encounter: Secondary | ICD-10-CM | POA: Diagnosis not present

## 2015-01-03 DIAGNOSIS — G309 Alzheimer's disease, unspecified: Secondary | ICD-10-CM | POA: Diagnosis not present

## 2015-01-03 DIAGNOSIS — I10 Essential (primary) hypertension: Secondary | ICD-10-CM | POA: Diagnosis present

## 2015-01-03 DIAGNOSIS — Z7901 Long term (current) use of anticoagulants: Secondary | ICD-10-CM | POA: Diagnosis not present

## 2015-01-03 DIAGNOSIS — Z96641 Presence of right artificial hip joint: Secondary | ICD-10-CM | POA: Diagnosis present

## 2015-01-03 DIAGNOSIS — Z880 Allergy status to penicillin: Secondary | ICD-10-CM | POA: Diagnosis not present

## 2015-01-03 DIAGNOSIS — K567 Ileus, unspecified: Secondary | ICD-10-CM | POA: Diagnosis not present

## 2015-01-03 DIAGNOSIS — M810 Age-related osteoporosis without current pathological fracture: Secondary | ICD-10-CM | POA: Diagnosis present

## 2015-01-03 DIAGNOSIS — K625 Hemorrhage of anus and rectum: Secondary | ICD-10-CM | POA: Diagnosis not present

## 2015-01-03 DIAGNOSIS — R627 Adult failure to thrive: Secondary | ICD-10-CM | POA: Diagnosis present

## 2015-01-03 DIAGNOSIS — Z79899 Other long term (current) drug therapy: Secondary | ICD-10-CM | POA: Diagnosis not present

## 2015-01-03 DIAGNOSIS — K922 Gastrointestinal hemorrhage, unspecified: Secondary | ICD-10-CM | POA: Diagnosis not present

## 2015-01-03 DIAGNOSIS — L409 Psoriasis, unspecified: Secondary | ICD-10-CM | POA: Diagnosis present

## 2015-01-03 DIAGNOSIS — Z8673 Personal history of transient ischemic attack (TIA), and cerebral infarction without residual deficits: Secondary | ICD-10-CM | POA: Diagnosis not present

## 2015-01-03 DIAGNOSIS — E039 Hypothyroidism, unspecified: Secondary | ICD-10-CM | POA: Diagnosis present

## 2015-01-03 DIAGNOSIS — Q2733 Arteriovenous malformation of digestive system vessel: Secondary | ICD-10-CM | POA: Diagnosis not present

## 2015-01-03 DIAGNOSIS — L89322 Pressure ulcer of left buttock, stage 2: Secondary | ICD-10-CM | POA: Diagnosis present

## 2015-01-03 DIAGNOSIS — M549 Dorsalgia, unspecified: Secondary | ICD-10-CM | POA: Diagnosis present

## 2015-01-03 DIAGNOSIS — Z87891 Personal history of nicotine dependence: Secondary | ICD-10-CM | POA: Diagnosis not present

## 2015-01-03 DIAGNOSIS — Z86718 Personal history of other venous thrombosis and embolism: Secondary | ICD-10-CM | POA: Diagnosis not present

## 2015-01-03 DIAGNOSIS — K921 Melena: Secondary | ICD-10-CM | POA: Diagnosis present

## 2015-01-03 DIAGNOSIS — T45515A Adverse effect of anticoagulants, initial encounter: Secondary | ICD-10-CM | POA: Diagnosis present

## 2015-01-03 DIAGNOSIS — Z853 Personal history of malignant neoplasm of breast: Secondary | ICD-10-CM | POA: Diagnosis not present

## 2015-01-03 DIAGNOSIS — E876 Hypokalemia: Secondary | ICD-10-CM | POA: Diagnosis not present

## 2015-01-03 DIAGNOSIS — Z888 Allergy status to other drugs, medicaments and biological substances status: Secondary | ICD-10-CM | POA: Diagnosis not present

## 2015-01-03 DIAGNOSIS — G8929 Other chronic pain: Secondary | ICD-10-CM | POA: Diagnosis present

## 2015-01-03 DIAGNOSIS — Z885 Allergy status to narcotic agent status: Secondary | ICD-10-CM | POA: Diagnosis not present

## 2015-01-03 DIAGNOSIS — I8393 Asymptomatic varicose veins of bilateral lower extremities: Secondary | ICD-10-CM | POA: Diagnosis present

## 2015-01-03 DIAGNOSIS — F0280 Dementia in other diseases classified elsewhere without behavioral disturbance: Secondary | ICD-10-CM | POA: Diagnosis present

## 2015-01-03 DIAGNOSIS — Z91048 Other nonmedicinal substance allergy status: Secondary | ICD-10-CM | POA: Diagnosis not present

## 2015-01-03 DIAGNOSIS — R32 Unspecified urinary incontinence: Secondary | ICD-10-CM | POA: Diagnosis present

## 2015-01-03 DIAGNOSIS — H548 Legal blindness, as defined in USA: Secondary | ICD-10-CM | POA: Diagnosis present

## 2015-01-03 DIAGNOSIS — Z66 Do not resuscitate: Secondary | ICD-10-CM | POA: Diagnosis present

## 2015-01-03 DIAGNOSIS — K649 Unspecified hemorrhoids: Secondary | ICD-10-CM | POA: Diagnosis present

## 2015-01-03 DIAGNOSIS — D6832 Hemorrhagic disorder due to extrinsic circulating anticoagulants: Secondary | ICD-10-CM | POA: Diagnosis present

## 2015-01-03 DIAGNOSIS — G40909 Epilepsy, unspecified, not intractable, without status epilepticus: Secondary | ICD-10-CM | POA: Diagnosis present

## 2015-01-03 DIAGNOSIS — K5641 Fecal impaction: Secondary | ICD-10-CM | POA: Diagnosis not present

## 2015-01-03 DIAGNOSIS — E870 Hyperosmolality and hypernatremia: Secondary | ICD-10-CM | POA: Diagnosis not present

## 2015-01-03 DIAGNOSIS — T45511A Poisoning by anticoagulants, accidental (unintentional), initial encounter: Secondary | ICD-10-CM | POA: Diagnosis present

## 2015-01-03 DIAGNOSIS — D72829 Elevated white blood cell count, unspecified: Secondary | ICD-10-CM | POA: Diagnosis not present

## 2015-01-03 DIAGNOSIS — E860 Dehydration: Secondary | ICD-10-CM | POA: Diagnosis present

## 2015-01-03 DIAGNOSIS — I482 Chronic atrial fibrillation: Secondary | ICD-10-CM | POA: Diagnosis present

## 2015-01-03 DIAGNOSIS — H3582 Retinal ischemia: Secondary | ICD-10-CM | POA: Diagnosis present

## 2015-01-03 LAB — CBC WITH DIFFERENTIAL/PLATELET
BASOS ABS: 0 10*3/uL (ref 0.0–0.1)
Basophils Relative: 0 % (ref 0–1)
EOS ABS: 0 10*3/uL (ref 0.0–0.7)
Eosinophils Relative: 0 % (ref 0–5)
HEMATOCRIT: 47.8 % — AB (ref 36.0–46.0)
HEMOGLOBIN: 15.4 g/dL — AB (ref 12.0–15.0)
LYMPHS ABS: 3.2 10*3/uL (ref 0.7–4.0)
Lymphocytes Relative: 23 % (ref 12–46)
MCH: 26.6 pg (ref 26.0–34.0)
MCHC: 32.2 g/dL (ref 30.0–36.0)
MCV: 82.6 fL (ref 78.0–100.0)
MONO ABS: 1 10*3/uL (ref 0.1–1.0)
MONOS PCT: 7 % (ref 3–12)
Neutro Abs: 9.9 10*3/uL — ABNORMAL HIGH (ref 1.7–7.7)
Neutrophils Relative %: 70 % (ref 43–77)
PLATELETS: 345 10*3/uL (ref 150–400)
RBC: 5.79 MIL/uL — ABNORMAL HIGH (ref 3.87–5.11)
RDW: 15.8 % — AB (ref 11.5–15.5)
WBC: 14.1 10*3/uL — AB (ref 4.0–10.5)

## 2015-01-03 LAB — CBC
HCT: 41.5 % (ref 36.0–46.0)
HEMOGLOBIN: 13.3 g/dL (ref 12.0–15.0)
MCH: 26.3 pg (ref 26.0–34.0)
MCHC: 32 g/dL (ref 30.0–36.0)
MCV: 82 fL (ref 78.0–100.0)
PLATELETS: 278 10*3/uL (ref 150–400)
RBC: 5.06 MIL/uL (ref 3.87–5.11)
RDW: 15.9 % — AB (ref 11.5–15.5)
WBC: 14.1 10*3/uL — ABNORMAL HIGH (ref 4.0–10.5)

## 2015-01-03 LAB — URINALYSIS, ROUTINE W REFLEX MICROSCOPIC
Glucose, UA: NEGATIVE mg/dL
HGB URINE DIPSTICK: NEGATIVE
KETONES UR: NEGATIVE mg/dL
NITRITE: POSITIVE — AB
PH: 5 (ref 5.0–8.0)
Protein, ur: NEGATIVE mg/dL
Specific Gravity, Urine: 1.017 (ref 1.005–1.030)
UROBILINOGEN UA: 1 mg/dL (ref 0.0–1.0)

## 2015-01-03 LAB — PROTIME-INR
INR: 2.23 — ABNORMAL HIGH (ref 0.00–1.49)
Prothrombin Time: >90 seconds — ABNORMAL HIGH (ref 11.6–15.2)
Prothrombin Time: 24.5 seconds — ABNORMAL HIGH (ref 11.6–15.2)

## 2015-01-03 LAB — TYPE AND SCREEN
ABO/RH(D): O POS
ANTIBODY SCREEN: NEGATIVE

## 2015-01-03 LAB — I-STAT CHEM 8, ED
BUN: 30 mg/dL — AB (ref 6–20)
CREATININE: 1.1 mg/dL — AB (ref 0.44–1.00)
Calcium, Ion: 1.22 mmol/L (ref 1.13–1.30)
Chloride: 111 mmol/L (ref 101–111)
Glucose, Bld: 136 mg/dL — ABNORMAL HIGH (ref 65–99)
HCT: 49 % — ABNORMAL HIGH (ref 36.0–46.0)
Hemoglobin: 16.7 g/dL — ABNORMAL HIGH (ref 12.0–15.0)
Potassium: 3.6 mmol/L (ref 3.5–5.1)
Sodium: 153 mmol/L — ABNORMAL HIGH (ref 135–145)
TCO2: 26 mmol/L (ref 0–100)

## 2015-01-03 LAB — URINE MICROSCOPIC-ADD ON

## 2015-01-03 LAB — POC OCCULT BLOOD, ED: Fecal Occult Bld: POSITIVE — AB

## 2015-01-03 MED ORDER — POLYETHYLENE GLYCOL 3350 17 G PO PACK
17.0000 g | PACK | Freq: Every day | ORAL | Status: DC
  Administered 2015-01-03 – 2015-01-05 (×3): 17 g via ORAL
  Filled 2015-01-03 (×3): qty 1

## 2015-01-03 MED ORDER — PILOCARPINE HCL 1 % OP SOLN: 1.0000 [drp] | Freq: Two times a day (BID) | OPHTHALMIC | Status: DC

## 2015-01-03 MED ORDER — LETROZOLE 2.5 MG PO TABS: 2.5000 mg | ORAL_TABLET | Freq: Every day | ORAL | Status: DC

## 2015-01-03 MED ORDER — MAGNESIUM HYDROXIDE 400 MG/5ML PO SUSP
30.0000 mL | Freq: Once | ORAL | Status: AC
  Administered 2015-01-03: 30 mL via ORAL
  Filled 2015-01-03: qty 30

## 2015-01-03 MED ORDER — PANTOPRAZOLE SODIUM 40 MG PO TBEC
40.0000 mg | DELAYED_RELEASE_TABLET | Freq: Every day | ORAL | Status: DC
  Administered 2015-01-05: 40 mg via ORAL
  Filled 2015-01-03 (×3): qty 1

## 2015-01-03 MED ORDER — CIPROFLOXACIN IN D5W 400 MG/200ML IV SOLN
400.0000 mg | Freq: Two times a day (BID) | INTRAVENOUS | Status: DC
  Administered 2015-01-03: 400 mg via INTRAVENOUS
  Filled 2015-01-03 (×2): qty 200

## 2015-01-03 MED ORDER — GLUCERNA SHAKE PO LIQD: 237.0000 mL | Freq: Two times a day (BID) | ORAL | Status: DC | PRN

## 2015-01-03 MED ORDER — SODIUM CHLORIDE 0.45 % IV SOLN
INTRAVENOUS | Status: DC
  Administered 2015-01-03 – 2015-01-04 (×2): via INTRAVENOUS

## 2015-01-03 MED ORDER — HYDROCORTISONE ACETATE 25 MG RE SUPP
25.0000 mg | Freq: Two times a day (BID) | RECTAL | Status: DC
  Administered 2015-01-03 – 2015-01-10 (×12): 25 mg via RECTAL
  Filled 2015-01-03 (×18): qty 1

## 2015-01-03 MED ORDER — POLYETHYL GLYCOL-PROPYL GLYCOL 0.4-0.3 % OP GEL: 1.0000 [drp] | Freq: Two times a day (BID) | OPHTHALMIC | Status: DC | PRN

## 2015-01-03 MED ORDER — BRIMONIDINE TARTRATE 0.2 % OP SOLN
1.0000 [drp] | Freq: Every day | OPHTHALMIC | Status: DC
  Filled 2015-01-03: qty 5

## 2015-01-03 MED ORDER — SODIUM CHLORIDE 0.9 % IV BOLUS (SEPSIS)
1000.0000 mL | Freq: Once | INTRAVENOUS | Status: AC
  Administered 2015-01-03: 1000 mL via INTRAVENOUS

## 2015-01-03 MED ORDER — ALBUTEROL SULFATE (2.5 MG/3ML) 0.083% IN NEBU: 2.5000 mg | INHALATION_SOLUTION | Freq: Four times a day (QID) | RESPIRATORY_TRACT | Status: DC | PRN

## 2015-01-03 MED ORDER — HALOPERIDOL 1 MG PO TABS
2.0000 mg | ORAL_TABLET | ORAL | Status: DC | PRN
  Filled 2015-01-03: qty 3

## 2015-01-03 MED ORDER — ACETAMINOPHEN 500 MG PO TABS
1000.0000 mg | ORAL_TABLET | Freq: Four times a day (QID) | ORAL | Status: DC | PRN
  Administered 2015-01-03: 1000 mg via ORAL
  Filled 2015-01-03: qty 2

## 2015-01-03 MED ORDER — BISACODYL 5 MG PO TBEC
5.0000 mg | DELAYED_RELEASE_TABLET | Freq: Every day | ORAL | Status: DC | PRN
  Filled 2015-01-03: qty 1

## 2015-01-03 MED ORDER — MOMETASONE FURO-FORMOTEROL FUM 100-5 MCG/ACT IN AERO
2.0000 | INHALATION_SPRAY | Freq: Two times a day (BID) | RESPIRATORY_TRACT | Status: DC
  Administered 2015-01-03 – 2015-01-10 (×14): 2 via RESPIRATORY_TRACT
  Filled 2015-01-03: qty 8.8

## 2015-01-03 MED ORDER — CIPROFLOXACIN IN D5W 400 MG/200ML IV SOLN
400.0000 mg | INTRAVENOUS | Status: DC
  Administered 2015-01-04 – 2015-01-07 (×4): 400 mg via INTRAVENOUS
  Filled 2015-01-03 (×5): qty 200

## 2015-01-03 MED ORDER — DORZOLAMIDE HCL-TIMOLOL MAL 2-0.5 % OP SOLN: 1.0000 [drp] | Freq: Two times a day (BID) | OPHTHALMIC | Status: DC

## 2015-01-03 MED ORDER — CIPROFLOXACIN IN D5W 400 MG/200ML IV SOLN
400.0000 mg | Freq: Once | INTRAVENOUS | Status: AC
  Administered 2015-01-03: 400 mg via INTRAVENOUS
  Filled 2015-01-03: qty 200

## 2015-01-03 MED ORDER — ALBUTEROL SULFATE HFA 108 (90 BASE) MCG/ACT IN AERS: 2.0000 | INHALATION_SPRAY | Freq: Four times a day (QID) | RESPIRATORY_TRACT | Status: DC | PRN

## 2015-01-03 MED ORDER — LEVOTHYROXINE SODIUM 88 MCG PO TABS
88.0000 ug | ORAL_TABLET | Freq: Every day | ORAL | Status: DC
  Administered 2015-01-04 – 2015-01-10 (×6): 88 ug via ORAL
  Filled 2015-01-03 (×10): qty 1

## 2015-01-03 MED ORDER — SODIUM CHLORIDE 0.9 % IV SOLN
INTRAVENOUS | Status: DC
  Administered 2015-01-03 (×2): via INTRAVENOUS

## 2015-01-03 MED ORDER — PHYTONADIONE 5 MG PO TABS
5.0000 mg | ORAL_TABLET | Freq: Once | ORAL | Status: AC
  Administered 2015-01-03: 5 mg via ORAL
  Filled 2015-01-03: qty 1

## 2015-01-03 MED ORDER — LOSARTAN POTASSIUM 25 MG PO TABS
25.0000 mg | ORAL_TABLET | Freq: Every day | ORAL | Status: DC
Start: 2015-01-03 — End: 2015-01-10
  Administered 2015-01-03 – 2015-01-10 (×6): 25 mg via ORAL
  Filled 2015-01-03 (×9): qty 1

## 2015-01-03 MED ORDER — METHADONE HCL 10 MG PO TABS
5.0000 mg | ORAL_TABLET | Freq: Three times a day (TID) | ORAL | Status: DC
  Administered 2015-01-03 – 2015-01-07 (×10): 5 mg via ORAL
  Filled 2015-01-03 (×13): qty 1

## 2015-01-03 MED ORDER — LEVETIRACETAM 100 MG/ML PO SOLN
500.0000 mg | Freq: Two times a day (BID) | ORAL | Status: DC
  Administered 2015-01-03 – 2015-01-07 (×9): 500 mg via ORAL
  Filled 2015-01-03 (×13): qty 5

## 2015-01-03 MED ORDER — DILTIAZEM GEL 2 %
1.0000 "application " | Freq: Two times a day (BID) | CUTANEOUS | Status: DC
  Administered 2015-01-09: 1 via TOPICAL
  Filled 2015-01-03: qty 30

## 2015-01-03 MED ORDER — MEMANTINE HCL ER 28 MG PO CP24: 28.0000 mg | ORAL_CAPSULE | Freq: Every day | ORAL | Status: DC

## 2015-01-03 MED ORDER — IPRATROPIUM-ALBUTEROL 0.5-2.5 (3) MG/3ML IN SOLN
3.0000 mL | Freq: Four times a day (QID) | RESPIRATORY_TRACT | Status: DC
  Administered 2015-01-03 – 2015-01-07 (×19): 3 mL via RESPIRATORY_TRACT
  Filled 2015-01-03 (×18): qty 3

## 2015-01-03 MED ORDER — GABAPENTIN 300 MG PO CAPS
600.0000 mg | ORAL_CAPSULE | Freq: Every day | ORAL | Status: DC
  Administered 2015-01-03 – 2015-01-09 (×3): 600 mg via ORAL
  Filled 2015-01-03 (×9): qty 2

## 2015-01-03 MED ORDER — VITAMIN K1 10 MG/ML IJ SOLN
10.0000 mg | Freq: Once | INTRAVENOUS | Status: AC
  Administered 2015-01-03: 10 mg via INTRAVENOUS
  Filled 2015-01-03: qty 1

## 2015-01-03 NOTE — Progress Notes (Addendum)
PROGRESS NOTE    Jennifer Brennan ZOX:[id number] DOB: [birth year] DOA: 01/02/2015 PCP: Estill Dooms, MD  Primary cardiologist: Dr. Sherren Mocha  HPI/Brief narrative 81 y.o. female currently on hospice for end stage dementia, HTN, hypothyroid, recurrent DVTs & A. fib on chronic Coumadin, chronic back pain on opioids, CVA presented to [facility] ED on 7/12 with 1 hour history of "pouring" BRBPR.Patient also recently had an episode of vaginal bleeding, daughter called [name] who was "not concerned" per daughter. Hospice nurse was concerned about uterine cancer. Patient was also taken off of "the pill she took to prevent breast cancer from coming back" as well. As per family, prior episode of rectal bleeding last year. Coagulopathy, on admission with INR >10. Status post vitamin K 1 dose and rectal bleeding decreasing. As per discussion with patient's daughters at bedside, no aggressive workup i.e. no GI consultation or endoscopies.   Assessment/Plan:  Suspected acute lower GI bleed - DD: Hemorrhoidal related to constipation, diverticulosis, polyps, AVMs, malignancy versus other etiology complicating coagulopathy secondary to Coumadin - As per family, has never had colonoscopy - Coagulopathy reversed with vitamin K 10 mg and Coumadin obviously held - Rectal bleeding improving. - Discussed extensively with patient's 2 daughters at bedside. They do not wish any aggressive workup i.e. colonoscopy or EGD. They checked with patient who also verbalized no aggressive workup. - Hemoglobin has dropped from 15.4 on admission to 13.3. Some of this drop may be dilutional  Coagulopathy secondary to Coumadin - Likely related to poor oral intake from advanced dementia - Coumadin held and INR reversed with vitamin K - INR has improved from >10 on admission to 2.2. We will provide additional dose of vitamin K. - Discontinuing anticoagulation indefinitely-please see discussion below  Dehydration  with hypernatremia - And tinea gentle IV fluid hydration and follow BMP  Advanced dementia/failure to thrive - Is on home hospice. - Daughters agree that she will not be appropriate candidate for anticoagulation any longer due to significant GI bleed. Also advised them that this will put her at increased risk for strokes or VTE and they verbalize understanding and are agreeable to discontinuing anticoagulation indefinitely  History of recurrent DVTs/A. Fib - Discussion regarding anticoagulation as above  Essential hypertension - Mildly uncontrolled. Continue losartan  Chronic pain - Continue methadone  Hypothyroid - Continue Synthroid  Fecal impaction/constipation - Bowel regimen.  Presumed UTI - IV Cipro  Seizure disorder - Continue Keppra and gabapentin    DVT prophylaxis: SCDs  Code Status: DO NOT RESUSCITATE  Family Communication: Discussed with patient's daughters [name] and Renee at bedside on 7/13  Disposition Plan: DC home with hospice when medically stable  Consultants:  None  Procedures:  None   Antibiotics:  None   Subjective: Patient unable to provide much history. Does refuse colonoscopy. As per daughters, rectal bleeding has significantly improved. Chronic back pain.   Objective: Filed Vitals:   01/03/15 0849 01/03/15 1047 01/03/15 1317 01/03/15 1319  BP:  170/70 162/57   Pulse:  59 47   Temp:   97.7 F (36.5 C)   TempSrc:   Axillary   Resp:   18   Height:      Weight:      SpO2: 96%  96% 95%    Intake/Output Summary (Last 24 hours) at 01/03/15 1513 Last data filed at 01/03/15 1453  Gross per 24 hour  Intake 1194.17 ml  Output     20 ml  Net 1174.17 ml  Filed Weights   01/03/15 0332  Weight: 63.6 kg (140 lb 3.4 oz)     Exam:  General exam: Elderly frail female patient lying comfortably in bed  Respiratory system: Clear. No increased work of breathing. Cardiovascular system: S1 & S2 heard, RRR. No JVD, murmurs,  gallops, clicks or pedal edema. Gastrointestinal system: Abdomen is nondistended, soft and nontender. Normal bowel sounds heard. Central nervous system: Somnolent but easily arousable and oriented only to self. No focal neurological deficits. Extremities: Symmetric 5 x 5 power.   Data Reviewed: Basic Metabolic Panel:  Recent Labs Lab 01/03/15 0037  NA 153*  K 3.6  CL 111  GLUCOSE 136*  BUN 30*  CREATININE 1.10*   Liver Function Tests: No results for input(s): AST, ALT, ALKPHOS, BILITOT, PROT, ALBUMIN in the last 168 hours. No results for input(s): LIPASE, AMYLASE in the last 168 hours. No results for input(s): AMMONIA in the last 168 hours. CBC:  Recent Labs Lab 01/03/15 0031 01/03/15 0037 01/03/15 0929  WBC 14.1*  --  14.1*  NEUTROABS 9.9*  --   --   HGB 15.4* 16.7* 13.3  HCT 47.8* 49.0* 41.5  MCV 82.6  --  82.0  PLT 345  --  278   Cardiac Enzymes: No results for input(s): CKTOTAL, CKMB, CKMBINDEX, TROPONINI in the last 168 hours. BNP (last 3 results) No results for input(s): PROBNP in the last 8760 hours. CBG: No results for input(s): GLUCAP in the last 168 hours.  No results found for this or any previous visit (from the past 240 hour(s)).       Studies: Dg Abd Acute W/chest  01/03/2015   CLINICAL DATA:  Bright red blood from rectum on our and a half ago. Constipation.  EXAM: DG ABDOMEN ACUTE W/ 1V CHEST  COMPARISON:  Chest 09/25/2013  FINDINGS: Shallow inspiration. Linear atelectasis in the right mid lung. Normal heart size and pulmonary vascularity. Mild central bronchiectasis with bronchial wall thickening suggesting chronic bronchitis. Calcification of the aorta.  Lumbar scoliosis convex towards the right. Large amount of stool in the rectum. Scattered gas is stool throughout the remainder the colon. No small or large bowel distention. No free intra-abdominal air. No abnormal air-fluid levels. No radiopaque stones.  IMPRESSION: Shallow inspiration with  atelectasis in the right mid lung. Chronic bronchitic changes in the lungs.  Nonobstructive bowel gas pattern. Large amount of stool seen in the rectum.   Electronically Signed   By: Lucienne Capers M.D.   On: 01/03/2015 00:54        Scheduled Meds: . brimonidine  1 drop Both Eyes Daily  . [START ON 01/04/2015] ciprofloxacin  400 mg Intravenous Q24H  . diltiazem  1 application Topical BID  . gabapentin  600 mg Oral QHS  . hydrocortisone  25 mg Rectal BID  . ipratropium-albuterol  3 mL Inhalation Q6H  . levETIRAcetam  500 mg Oral BID  . levothyroxine  88 mcg Oral QAC breakfast  . losartan  25 mg Oral Daily  . methadone  5 mg Oral 3 times per day  . mometasone-formoterol  2 puff Inhalation BID  . pantoprazole  40 mg Oral Daily  . polyethylene glycol  17 g Oral Daily   Continuous Infusions: . sodium chloride 125 mL/hr at 01/03/15 1330    Principal Problem:   BRBPR (bright red blood per rectum) Active Problems:   Alzheimer's disease   Long term current use of anticoagulant therapy   Breast cancer, left breast   Adult  failure to thrive   Coumadin toxicity   Dehydration with hypernatremia    Time spent: 34 minutes   HONGALGI,ANAND, MD, FACP, FHM. [facility] Pager [phone]  If 7PM-7AM, please contact night-coverage [email or web address] Password [email or web address] 01/03/2015, 3:13 PM    LOS: 0 days

## 2015-01-03 NOTE — Progress Notes (Signed)
[facility]on[facility] Hugu[facility] 5W-Room 13-[facility]-RN GIP Visit-Stacie Delice Lesch RN, BSN  This a related admission to her [facility] diagnosis of Alzheimer's Disease per Dr. Karie Georges.  She is a DNR.  Patient seen in room resting comfortable with daughters at bedside.  Per daughters, patient started having bright red blood per rectum around 11 pm last night, when they put in a call to [facility].  She had the option to have an on-call RN make a visit, however, she choose to call EMS after the bleeding would not cease with application of firm pressure.  Patient is nonverbal.  She appears comfortable.  She is receiving IVF via a PIV. She is on 2L [place]. She is currently receiving IV Cipro for treatment of a UTI found this admission. Her INR was greater than 10 per chart review and 10mg  Vitamin K was administered IV.  Plan is to discharge back home with hospice when symptoms improve.  [facility] medication list and transfer summary placed in front of chart.  [facility] will continue to follow and anticipate any discharge needs.  Please call with any questions.  [facility] Liaison [phone]

## 2015-01-03 NOTE — Progress Notes (Signed)
Nutrition Brief Note  Chart reviewed. Pt admitted for rectal bleeding. Pt is followed by [facility]; plan to discharge back to services once symptoms improve.  Per RN, intake is minimal at baseline; she has had difficulty taking PO's and medications.  Reviewed SLP note; pt with severe aspiration risk. No further nutrition interventions warranted at this time.  Please re-consult as needed.   Carola Viramontes A. Jimmye Norman, RD, LDN, CDE Pager: [phone] After hours Pager: [phone]

## 2015-01-03 NOTE — Plan of Care (Signed)
Problem: Consults Goal: General Medical Patient Education See Patient Education Module for specific education. Outcome: Progressing Bloody stool

## 2015-01-03 NOTE — Consult Note (Addendum)
  WOC wound consult note Reason for Consult: Consult requested for buttocks/sacrum. Daughter at bedside states wound has been present prior to admission but has declined at this time. There are multiple systemic factors which can impair healing including: Pt is frequently incontinent of urine and bloody drainage from vagina, it is difficult to keep dressing from becoming soiled, decreased Hgb and increased INR. She is immobile, and daughter admits pt has a poor intake of food and liquids. Wound type: Stage 2 pressure injury to sacrum/buttocks area; affected area with erythremia surrounding to 4.5X2cm, with open stage 2 wound over left buttock .2X.2X.1cm with dark red wound bed. Pressure Ulcer POA: Yes Drainage (amount, consistency, odor) Small amt pink drainage, no odor Dressing procedure/placement/frequency: Pt has previously been using a hydrocolloid dressing to protect the site, but this is trapping urine against the skin and creating a medical adhesive removal skin injury.  Applied foam dressing to protect site and wick drainage away from wound bed, silicone will decrease skin stripping to the location. Air mattress has already been requested earlier according to the EMR.Total bed change performed and discussed plan of care with daughter; she verbalizes understanding. Please re-consult if further assistance is needed.  Thank-you,  Julien Girt MSN, [place], [place], [place], [place]

## 2015-01-03 NOTE — Progress Notes (Signed)
Utilization Review completed. Shaquill Iseman RN BSN CM 

## 2015-01-03 NOTE — ED Notes (Signed)
Patient transported to X-ray 

## 2015-01-03 NOTE — Progress Notes (Signed)
Patient has advanced dementia and intermittently follows commands. Pt swallows some medications, but did not swallow all.  About 50% dripping out of mouth. Pt swallowed all of keppra, but not all of cozaar. Dr. Algis Liming made aware via text page.

## 2015-01-03 NOTE — Care Management Note (Addendum)
Case Management Note  Patient Details  Name: Jennifer Brennan MRN: [id number] Date of Birth: [birth year]  Subjective/Objective:                 Patient \'s daughter lives with her. She is followed by [facility]. Will resume care after discharge. Daughter denies any additional resources or equipment than what she already has.  Action/Plan:  Will continue to follow. Spoke with intake at [facility] and informed them of patient's admission with possible discharge over the weekend. CM provided personal contact information so [facility] could request clinicals on Monday if discharged over the weekend and needed. [facility] appreciated the update. 01-10-15 Patient to be discharged to hospice today. [facility] following and aware of discharge planned for today. Will continue to resource until discharge. No further needs at this time.  Expected Discharge Date:  01/05/15               Expected Discharge Plan:  Home w Hospice Care  In-House Referral:  Clinical Social Work  Discharge planning Services  CM Consult  Post Acute Care Choice:    Choice offered to:  Adult Children  DME Arranged:    DME Agency:     HH Arranged:    HH Agency:  [facility]   Status of Service:  In process, will continue to follow  Medicare Important Message Given:    Date Medicare IM Given:    Medicare IM give by:    Date Additional Medicare IM Given:    Additional Medicare Important Message give by:     If discussed at [place] of Stay Meetings, dates discussed:    Additional Comments:  Carles Collet, RN 01/03/2015, 12:41 PM

## 2015-01-03 NOTE — H&P (Signed)
[facility] History and Physical  Jennifer Brennan ZOX:[id number] DOB: 07/24/1923 DOA: 01/02/2015  Referring physician: EDP PCP: Estill Dooms, MD   Chief Complaint: BRBPR   HPI: Jennifer Brennan is a 81 y.o. female currently on hospice for end stage dementia.  Patient presents to the ED with 1 hour history of "pouring" BRBPR.  Caregiver states patient has not had a BM today which is unusual for her.  This occurs in the context of chronic coumadin use for recurrent DVTs and A.Fib.  Patient also recently had an episode of vaginal bleeding, daughter called [name] who was "not concerned" per daughter.  Hospice nurse was concerned about uterine cancer.  Patient was also taken off of "the pill she took to prevent breast cancer from coming back" as well.  Review of Systems: Systems reviewed.  As above, otherwise negative  Past Medical History  Diagnosis Date  . Hypertension   . Hypothyroidism   . Seizure disorder   . Severe stage glaucoma     legally blind  . Depression with anxiety   . OAB (overactive bladder)   . DVT 05/2007    RLE following R THR - chronic coumadin, chronic RLE pain  . GLAUCOMA   . OSTEOPENIA   . Retinal ischemia   . Sciatica of right side     chronic RLE pain, multifactorial - MRI T/L spine 02/2011  . VITAMIN D DEFICIENCY dx 08/2008  . Venous ulcer of right lower extremity with varicose veins   . Dementia   . Vulvar abscess   . Unspecified hypothyroidism   . Unspecified vitamin D deficiency   . Unspecified urinary incontinence   . Renal disorder   . Varicose veins of lower extremity   . Varicose veins of lower extremities with ulcer   . Unspecified disorder of kidney and ureter   . Stevens-Johnson syndrome   . Sebaceous cyst   . Disorder of bone and cartilage, unspecified   . Contusion of lower leg   . Depression   . Seizures   . Osteoporosis, unspecified 05/12/2013  . Acute asthmatic bronchitis 09/18/2013  . DVT, lower extremity   .  ADENOCARCINOMA, LEFT BREAST 04/11/2010    s/p L mastectomy, on femara x 13yr  . OSTEOARTHRITIS, HANDS, BILATERAL   . Arthritis     "back; hands; toes" (09/26/2013)  . Chronic back pain     "upper and lower" (09/26/2013)  . Anxiety   . Blindness of both eyes     "from glaucoma". Also has bilateral occipital lobe infarcts.  . Cerebral atrophy   . Cerebrovascular disease   . Cerebral infarct     left parietal and bilateral occipital lobes   Past Surgical History  Procedure Laterality Date  . Total abdominal hysterectomy    . Total hip arthroplasty Right 10/2006  . Refractive surgery Bilateral   . Tonsillectomy    . Breast biopsy Left 03/2010  . Mastectomy Left 2011   Social History:  reports that she quit smoking about 35 years ago. Her smoking use included Cigarettes. She has a 45 pack-year smoking history. She has never used smokeless tobacco. She reports that she drinks alcohol. She reports that she does not use illicit drugs.  Allergies  Allergen Reactions  . Bactrim [Sulfamethoxazole-Trimethoprim] Other (See Comments)    MD stopped due to kidney failure Also causes lips to swell  . Doxycycline Swelling    Causes lips to swell, wheezing  . Furosemide Other (See Comments)  MD stopped due to kidney failure  . Percocet [Oxycodone-Acetaminophen] Other (See Comments)    Causes SEIZURES  . Valsartan Other (See Comments)    MD stopped due to kidney failure  . Penicillins Hives  . Adhesive [Tape] Rash  . Morphine Sulfate Itching and Rash  . Sertraline Hcl Rash    Family History  Problem Relation Age of Onset  . Ovarian cancer Mother   . Cancer Mother   . Deep vein thrombosis Mother   . Heart disease Mother   . Arthritis Other     grandparents  . Lung cancer Other   . Heart disease Other     parent  . Cancer Father     BRAIN TUMOR  . Cancer Brother   . Hyperlipidemia Brother   . Hypertension Brother   . Stroke Brother   . Arthritis Brother   . Sickle cell anemia  Daughter   . Heart disease Daughter   . Sickle cell anemia Daughter      Prior to Admission medications   Medication Sig Start Date End Date Taking? Authorizing Provider  acetaminophen (TYLENOL) 500 MG tablet Take 1,000 mg by mouth every 6 (six) hours as needed for mild pain, fever or headache. pain   Yes Historical Provider, MD  brimonidine (ALPHAGAN) 0.15 % ophthalmic solution Place 1 drop into both eyes daily.    Yes Historical Provider, MD  Fluticasone-Salmeterol (ADVAIR) 250-50 MCG/DOSE AEPB Inhale one puff every 12 hours to help breathing Patient taking differently: Inhale 1 puff into the lungs daily as needed (shortness of breath). Inhale one puff every 12 hours to help breathing 11/08/13  Yes Estill Dooms, MD  haloperidol (HALDOL) 2 MG/ML solution Take by mouth 2 (two) times daily.   Yes Historical Provider, MD  hydrocortisone (ANUSOL-HC) 25 MG suppository Place 1 suppository (25 mg total) rectally 2 (two) times daily. Patient taking differently: Place 25 mg rectally 2 (two) times daily as needed for hemorrhoids.  04/18/14  Yes Mahima Pandey, MD  Ipratropium-Albuterol (COMBIVENT RESPIMAT) 20-100 MCG/ACT AERS respimat Inhale 2 puffs into the lungs every 6 (six) hours. Take 2 puffs 4 times daily to help with breathing as needed. Patient taking differently: Inhale 1 puff into the lungs daily.  08/21/14  Yes Lauree Chandler, NP  levETIRAcetam (KEPPRA) 100 MG/ML solution Take 5 mLs (500 mg total) by mouth every 12 (twelve) hours. 10/05/14  Yes Estill Dooms, MD  levothyroxine (SYNTHROID, LEVOTHROID) 88 MCG tablet One daily for thyroid supplement 02/08/14  Yes Estill Dooms, MD  losartan (COZAAR) 50 MG tablet Take 1 tablet daily by mouth for blood pressure. Patient taking differently: Take 25 mg by mouth daily. Take 1/2 tablet daily by mouth for blood pressure. 06/07/14  Yes Estill Dooms, MD  methadone (DOLOPHINE) 5 MG tablet Take one tablet by mouth three times daily for pain.HOSPICE  PATIENT 11/16/14  Yes Lauree Chandler, NP  diltiazem 2 % GEL Apply 1 application topically 2 (two) times daily. Apply to anal fissures. Place a small amount just inside and outside of anus as needed 09/26/13   Modena Jansky, MD  dorzolamide-timolol (COSOPT) 22.3-6.8 MG/ML ophthalmic solution Place 1 drop into both eyes 2 (two) times daily.      Historical Provider, MD  Fluocinolone Acetonide 0.01 % OIL Use on scalp for psoriasis 09/06/14   Estill Dooms, MD  Multiple Vitamin (MULTIVITAMIN) tablet Centrum Silver:  Take 1 tablet daily    Historical Provider, MD  neomycin-bacitracin-polymyxin (  NEOSPORIN) ointment Apply 1 application topically 3 (three) times daily.     Historical Provider, MD  omeprazole (PRILOSEC) 20 MG capsule Take 20 mg by mouth every other day. Take 1 tablet every other day to reduce stomach acids. 07/26/13   Estill Dooms, MD  pilocarpine (PILOCAR) 1 % ophthalmic solution Place 1 drop into both eyes 2 (two) times daily.  08/17/11   Historical Provider, MD  Polyethyl Glycol-Propyl Glycol (SYSTANE) 0.4-0.3 % GEL Apply 1 drop to eye 2 (two) times daily as needed.    Historical Provider, MD  polyethylene glycol powder (GLYCOLAX) powder 17 gram in 4-8 oz fluid daily. Adjust as needed to achieve 1-2 soft stools daily. 03/13/14   [name], RPH-CPP  PROAIR HFA 108 (90 BASE) MCG/ACT inhaler inhale 2 puffs by mouth every 6 hours if needed for wheezing 04/21/14   Lauree Chandler, NP  Probiotic Product (ALIGN) 4 MG CAPS Take by mouth. Take 1 capsule daily to help irritable colon. Only while on antibiotics.    Historical Provider, MD  triamcinolone cream (KENALOG) 0.1 % APPLY TO AFFECTED AREA TWICE A DAY TO RASH UNTIL IT IS RESOLVED 07/28/14   Estill Dooms, MD   Physical Exam: Filed Vitals:   01/03/15 0145  BP: 142/57  Pulse: 63  Temp:   Resp:     BP 142/57 mmHg  Pulse 63  Temp(Src) 97.5 F (36.4 C) (Oral)  Resp 22  SpO2 96%  General Appearance:    Alert, demented, no  distress, appears stated age  Head:    Normocephalic, atraumatic  Eyes:    PERRL, EOMI, sclera non-icteric        Nose:   Nares without drainage or epistaxis. Mucosa, turbinates normal  Throat:   Moist mucous membranes. Oropharynx without erythema or exudate.  Neck:   Supple. No carotid bruits.  No thyromegaly.  No lymphadenopathy.   Back:     No CVA tenderness, no spinal tenderness  Lungs:     Clear to auscultation bilaterally, without wheezes, rhonchi or rales  Chest wall:    No tenderness to palpitation  Heart:    Regular rate and rhythm without murmurs, gallops, rubs  Abdomen:     Soft, non-tender, nondistended, normal bowel sounds, no organomegaly  Genitalia:    deferred  Rectal:    Small amount of dry blood on diaper  Extremities:   No clubbing, cyanosis or edema.  Pulses:   2+ and symmetric all extremities  Skin:   Skin color, texture, turgor normal, no rashes or lesions, decubitus on sacrum  Lymph nodes:   Cervical, supraclavicular, and axillary nodes normal  Neurologic:   CNII-XII intact. Normal strength, sensation and reflexes      throughout    Labs on Admission:  Basic Metabolic Panel:  Recent Labs Lab 01/03/15 0037  NA 153*  K 3.6  CL 111  GLUCOSE 136*  BUN 30*  CREATININE 1.10*   Liver Function Tests: No results for input(s): AST, ALT, ALKPHOS, BILITOT, PROT, ALBUMIN in the last 168 hours. No results for input(s): LIPASE, AMYLASE in the last 168 hours. No results for input(s): AMMONIA in the last 168 hours. CBC:  Recent Labs Lab 01/03/15 0031 01/03/15 0037  WBC 14.1*  --   NEUTROABS 9.9*  --   HGB 15.4* 16.7*  HCT 47.8* 49.0*  MCV 82.6  --   PLT 345  --    Cardiac Enzymes: No results for input(s): CKTOTAL, CKMB, CKMBINDEX, TROPONINI in the last  168 hours.  BNP (last 3 results) No results for input(s): PROBNP in the last 8760 hours. CBG: No results for input(s): GLUCAP in the last 168 hours.  Radiological Exams on Admission: Dg Abd Acute  W/chest  01/03/2015   CLINICAL DATA:  Bright red blood from rectum on our and a half ago. Constipation.  EXAM: DG ABDOMEN ACUTE W/ 1V CHEST  COMPARISON:  Chest 09/25/2013  FINDINGS: Shallow inspiration. Linear atelectasis in the right mid lung. Normal heart size and pulmonary vascularity. Mild central bronchiectasis with bronchial wall thickening suggesting chronic bronchitis. Calcification of the aorta.  Lumbar scoliosis convex towards the right. Large amount of stool in the rectum. Scattered gas is stool throughout the remainder the colon. No small or large bowel distention. No free intra-abdominal air. No abnormal air-fluid levels. No radiopaque stones.  IMPRESSION: Shallow inspiration with atelectasis in the right mid lung. Chronic bronchitic changes in the lungs.  Nonobstructive bowel gas pattern. Large amount of stool seen in the rectum.   Electronically Signed   By: Lucienne Capers M.D.   On: 01/03/2015 00:54    EKG: Independently reviewed.  Assessment/Plan Principal Problem:   BRBPR (bright red blood per rectum) Active Problems:   Alzheimer's disease   Long term current use of anticoagulant therapy   Breast cancer, left breast   Adult failure to thrive   Coumadin toxicity   Dehydration with hypernatremia   1. BRBPR - most likely due to internal hemorrhoids in setting of constipation and INR > 10 1. Reverse coumadin with 10mg  vit K 2. If she starts having large volume active bleeding again, reverse with FFP. 3. Treat constipation with dulcolax and miralax 4. Consult GI in AM, but doubt they will want to do endoscopy on this patient given her clinical picture unless she starts bleeding again. 5. Holding coumadin. 6. SCDs for DVT PPX 2. Hypernatremia with dehydration - 1. Gentle hydration with IVF                                                                  3. Adult failure to thrive - Patient has end stage alzheimers disease 1. The uterine bleeding history is noted, she could  very well have endometrial carcinoma; however, discussed this with family, patient would likely not be a candidate for surgical treatment of this given her advanced (end stage) dementia that already has her in hospice. 2. Feel that her adult failure to thrive and poor PO intake at baseline will likely take her before cancer becomes a significant problem for her. 3. Family has made clear: no feeding tube 4. Therefore as I discussed with family, we will not pursue further work up during this admission unless she develops bleeding here.    Code Status: DNR/DNI - has yellow form with her as well  Family Communication: Family at bedside Disposition Plan: Admit to inpatient   Time spent: 70 min  GARDNER, JARED M. [facility] Pager [phone]  If 7AM-7PM, please contact the day team taking care of the patient [email or web address] Password [facility] 01/03/2015, 2:29 AM

## 2015-01-03 NOTE — Progress Notes (Signed)
[facility] Chaplain Visit: °Met with pt's daughter [name] and niece [name] (niece of pt's husband Harry) in hospital room, with pt semi-alert in bed, comfortable and not in distress, although frowning in fatigue and frustration with need to take medication.  Pt reassured by chaplain in faith affirmation about God providing through hospital staff and medication.  Pt able to respond with good eye contact and a few words.  Family feels encouraged pt has improved since admission, with blood discharge lessened, but it is too early to know what plan will be.  Discussed other family and need to update husband's minister, Rev Marlow of [facility]. Family thankful for hospice team support, chaplain spiritual support and prayer. °John Connor, ThM, Chaplain [facility] ° °

## 2015-01-03 NOTE — Evaluation (Signed)
Clinical/Bedside Swallow Evaluation Patient Details  Name: Jennifer Brennan MRN: [id number] Date of Birth: 01/20/1924  Today's Date: 01/03/2015 Time: SLP Start Time (ACUTE ONLY): 1438 SLP Stop Time (ACUTE ONLY): 1450 SLP Time Calculation (min) (ACUTE ONLY): 12 min  Past Medical History:  Past Medical History  Diagnosis Date  . Hypertension   . Hypothyroidism   . Seizure disorder   . Severe stage glaucoma     legally blind  . Depression with anxiety   . OAB (overactive bladder)   . DVT 05/2007    RLE following R THR - chronic coumadin, chronic RLE pain  . GLAUCOMA   . OSTEOPENIA   . Retinal ischemia   . Sciatica of right side     chronic RLE pain, multifactorial - MRI T/L spine 02/2011  . VITAMIN D DEFICIENCY dx 08/2008  . Venous ulcer of right lower extremity with varicose veins   . Dementia   . Vulvar abscess   . Unspecified hypothyroidism   . Unspecified vitamin D deficiency   . Unspecified urinary incontinence   . Renal disorder   . Varicose veins of lower extremity   . Varicose veins of lower extremities with ulcer   . Unspecified disorder of kidney and ureter   . Stevens-Johnson syndrome   . Sebaceous cyst   . Disorder of bone and cartilage, unspecified   . Contusion of lower leg   . Depression   . Seizures   . Osteoporosis, unspecified 05/12/2013  . Acute asthmatic bronchitis 09/18/2013  . DVT, lower extremity   . ADENOCARCINOMA, LEFT BREAST 04/11/2010    s/p L mastectomy, on femara x 16yr  . OSTEOARTHRITIS, HANDS, BILATERAL   . Arthritis     "back; hands; toes" (09/26/2013)  . Chronic back pain     "upper and lower" (09/26/2013)  . Anxiety   . Blindness of both eyes     "from glaucoma". Also has bilateral occipital lobe infarcts.  . Cerebral atrophy   . Cerebrovascular disease   . Cerebral infarct     left parietal and bilateral occipital lobes   Past Surgical History:  Past Surgical History  Procedure Laterality Date  . Total abdominal hysterectomy    .  Total hip arthroplasty Right 10/2006  . Refractive surgery Bilateral   . Tonsillectomy    . Breast biopsy Left 03/2010  . Mastectomy Left 2011   HPI:  Jennifer Brennan is a 81 y.o. female currently on hospice for end stage dementia. Patient presented to ED with rectal bleeding per dtr, caregiver.  Pt with decreasing PO intake at home, FTT.  Daughter describes minimal intake over the last several months.  During the last two weeks, family has been using a syringe in an effort to encourage some PO intake.  Pt tends to hold POs orally.         Assessment / Plan / Recommendation Clinical Impression  Pt with limited participation - could be aroused for brief moments, accepted spoon/cup to lips, but did not initiate a swallow response.  Daughter, who was present, describes chronic and progressive inability to swallow and lack of motivation to eat.  Will attempt re-assessment next day as pt may be more consistently alert.  However, pt's function is more likely at baseline.  Will return for f/u family education, as swallow decline is expected as natural trajectory of end-stage dementia.    Aspiration Risk  Severe    Diet Recommendation  (continue clears as able)   Medication Administration:  Crushed with puree Compensations: Slow rate;Small sips/bites    Other  Recommendations Oral Care Recommendations: Oral care QID   Follow Up Recommendations       Frequency and Duration min 2x/week  1 week       SLP Swallow Goals     Swallow Study Prior Functional Status       General Date of Onset:  (chronic, progressive) Other Pertinent Information: Jennifer Brennan is a 81 y.o. female currently on hospice for end stage dementia. Patient presents to the ED with 1 hour history of "pouring" BRBPR.  Pt with decreasing PO intake at home, FTT.  Daughter describes minimal intake over the last several months.  During the last two weeks, family has been using a syringe in an effort to encourage some PO  intake.  Pt tends to hold POs orally.       Type of Study: Bedside swallow evaluation Previous Swallow Assessment: 09/19/13 bedside swallow eval - regular diet, thin liquids Diet Prior to this Study:  (clear liquids) Temperature Spikes Noted: No Respiratory Status: Room air History of Recent Intubation: No Behavior/Cognition: Alert Oral Cavity - Dentition: Missing dentition Self-Feeding Abilities: Total assist Patient Positioning: Upright in bed Baseline Vocal Quality: Normal Volitional Cough: Cognitively unable to elicit Volitional Swallow: Unable to elicit    Oral/Motor/Sensory Function Overall Oral Motor/Sensory Function: Appears within functional limits for tasks assessed   Ice Chips Ice chips: Impaired Presentation: Spoon Oral Phase Impairments: Poor awareness of bolus;Impaired mastication Oral Phase Functional Implications: Left anterior spillage;Right anterior spillage   Thin Liquid Thin Liquid: Impaired Presentation: Spoon Oral Phase Impairments: Poor awareness of bolus Oral Phase Functional Implications: Right anterior spillage;Left anterior spillage    Nectar Thick Nectar Thick Liquid: Not tested   Honey Thick Honey Thick Liquid: Not tested   Puree Puree: Not tested   Solid   GO    Solid: Not tested       Jennifer Brennan 01/03/2015,3:12 PM

## 2015-01-04 DIAGNOSIS — E876 Hypokalemia: Secondary | ICD-10-CM

## 2015-01-04 DIAGNOSIS — K5641 Fecal impaction: Secondary | ICD-10-CM

## 2015-01-04 DIAGNOSIS — K922 Gastrointestinal hemorrhage, unspecified: Secondary | ICD-10-CM

## 2015-01-04 DIAGNOSIS — L8990 Pressure ulcer of unspecified site, unspecified stage: Secondary | ICD-10-CM | POA: Diagnosis present

## 2015-01-04 LAB — CBC
HEMATOCRIT: 39.5 % (ref 36.0–46.0)
Hemoglobin: 12.4 g/dL (ref 12.0–15.0)
MCH: 26 pg (ref 26.0–34.0)
MCHC: 31.4 g/dL (ref 30.0–36.0)
MCV: 82.8 fL (ref 78.0–100.0)
Platelets: 262 10*3/uL (ref 150–400)
RBC: 4.77 MIL/uL (ref 3.87–5.11)
RDW: 16 % — ABNORMAL HIGH (ref 11.5–15.5)
WBC: 12.2 10*3/uL — ABNORMAL HIGH (ref 4.0–10.5)

## 2015-01-04 LAB — PROTIME-INR
INR: 1.47 (ref 0.00–1.49)
PROTHROMBIN TIME: 17.9 s — AB (ref 11.6–15.2)

## 2015-01-04 LAB — BASIC METABOLIC PANEL
ANION GAP: 8 (ref 5–15)
BUN: 18 mg/dL (ref 6–20)
CHLORIDE: 114 mmol/L — AB (ref 101–111)
CO2: 26 mmol/L (ref 22–32)
CREATININE: 0.97 mg/dL (ref 0.44–1.00)
Calcium: 8.3 mg/dL — ABNORMAL LOW (ref 8.9–10.3)
GFR calc Af Amer: 58 mL/min — ABNORMAL LOW (ref >60)
GFR calc non Af Amer: 50 mL/min — ABNORMAL LOW (ref >60)
Glucose, Bld: 107 mg/dL — ABNORMAL HIGH (ref 65–99)
POTASSIUM: 3.2 mmol/L — AB (ref 3.5–5.1)
Sodium: 148 mmol/L — ABNORMAL HIGH (ref 135–145)

## 2015-01-04 MED ORDER — RESOURCE THICKENUP CLEAR PO POWD
ORAL | Status: DC | PRN
  Filled 2015-01-04: qty 125

## 2015-01-04 MED ORDER — MILK AND MOLASSES ENEMA
1.0000 | Freq: Once | RECTAL | Status: AC
  Administered 2015-01-04: 250 mL via RECTAL
  Filled 2015-01-04: qty 250

## 2015-01-04 MED ORDER — POTASSIUM CHLORIDE 2 MEQ/ML IV SOLN
INTRAVENOUS | Status: DC
  Administered 2015-01-04 – 2015-01-05 (×2): via INTRAVENOUS
  Filled 2015-01-04 (×4): qty 1000

## 2015-01-04 NOTE — Progress Notes (Signed)
Patient had not urinated for RN all afternoon- NT bladder scanned patient and found a residual of 376. NP on call notified. Awaiting any further orders.

## 2015-01-04 NOTE — Progress Notes (Signed)
No BM for this patient 7pm-7am on 01/03/2015. No blood noted during the shift on linen or bed pad.

## 2015-01-04 NOTE — Progress Notes (Signed)
PROGRESS NOTE    Jennifer Brennan UXN:[id number] DOB: Jan 20, 1924 DOA: 01/02/2015 PCP: Jennifer Dooms, MD  Primary cardiologist: Dr. Sherren Mocha  HPI/Brief narrative 81 y.o. female currently on hospice for end stage dementia, HTN, hypothyroid, recurrent DVTs & A. fib on chronic Coumadin, chronic back pain on opioids, CVA presented to [facility] ED on 7/12 with 1 hour history of "pouring" BRBPR.Patient also recently had an episode of vaginal bleeding, daughter called [name] who was "not concerned" per daughter. Hospice nurse was concerned about uterine cancer. Patient was also taken off of "the pill she took to prevent breast cancer from coming back" as well. As per family, prior episode of rectal bleeding last year. Coagulopathy, on admission with INR >10. Status post vitamin K 1 dose and rectal bleeding decreasing. As per discussion with patient's daughters at bedside, no aggressive workup i.e. no GI consultation or endoscopies.   Assessment/Plan:  Suspected acute lower GI bleed - DD: Hemorrhoidal related to constipation, diverticulosis, polyps, AVMs, malignancy versus other etiology complicating coagulopathy secondary to Coumadin - As per family, has never had colonoscopy - Coagulopathy reversed with vitamin K 10 mg and Coumadin obviously held - Rectal bleeding improving. - Discussed extensively with patient's 2 daughters at bedside. They do not wish any aggressive workup i.e. colonoscopy or EGD. They checked with patient who also verbalized no aggressive workup. - Hemoglobin has dropped from 15.4 on admission to 13.3 on 7/13.  - Per nursing and family report, rectal bleeding seems to have stopped sometime last night. Some blood noted post enema this afternoon. Continue to monitor  Coagulopathy secondary to Coumadin - Likely related to poor oral intake from advanced dementia - Coumadin held and INR reversed with vitamin K - Discontinuing anticoagulation indefinitely-please see  discussion below  Dehydration with hypernatremia - Continue gentle IV fluid/DW hydration and follow BMP  Advanced dementia/failure to thrive - Is on home hospice. - Daughters agree that she will not be appropriate candidate for anticoagulation any longer due to significant GI bleed. Also advised them that this will put her at increased risk for strokes or VTE and they verbalize understanding and are agreeable to discontinuing anticoagulation indefinitely  History of recurrent DVTs/A. Fib - Discussion regarding anticoagulation as above  Essential hypertension - Mildly uncontrolled. Continue losartan  Chronic pain - Continue methadone  Hypothyroid - Continue Synthroid  Fecal impaction/constipation - Bowel regimen: Tried MOM on 7/13-no record of BM. Trial of milk and molasses enema 1. Follow KUB in a.m.  Presumed UTI - IV Cipro  Seizure disorder - Continue Keppra and gabapentin  Hypokalemia - Replace and follow    DVT prophylaxis: SCDs  Code Status: DO NOT RESUSCITATE  Family Communication: Discussed with patient's daughters [name] at bedside on 7/14  Disposition Plan: DC home with hospice when medically stable  Consultants:  None  Procedures:  None   Antibiotics:  None   Subjective: Rectal bleeding seems to have stopped overnight.  Objective: Filed Vitals:   01/04/15 0513 01/04/15 0749 01/04/15 1342 01/04/15 1435  BP: 134/55   114/67  Pulse: 50   88  Temp: 98 F (36.7 C)   97.7 F (36.5 C)  TempSrc: Oral   Oral  Resp: 16   18  Height:      Weight:      SpO2: 100% 94% 90% 95%    Intake/Output Summary (Last 24 hours) at 01/04/15 1834 Last data filed at 01/04/15 1832  Gross per 24 hour  Intake 1013.75 ml  Output      0 ml  Net 1013.75 ml   Filed Weights   01/03/15 0332  Weight: 63.6 kg (140 lb 3.4 oz)     Exam:  General exam: Elderly frail female patient sitting up in bed being fed by daughter using a syringe. Respiratory system:  Clear. No increased work of breathing. Cardiovascular system: S1 & S2 heard, RRR. No JVD, murmurs, gallops, clicks or pedal edema. Gastrointestinal system: Abdomen is nondistended, soft and nontender. Normal bowel sounds heard. Central nervous system: Alert and oriented only to self. No focal neurological deficits. Extremities: Symmetric 5 x 5 power.   Data Reviewed: Basic Metabolic Panel:  Recent Labs Lab 01/03/15 0037 01/04/15 0423  NA 153* 148*  K 3.6 3.2*  CL 111 114*  CO2  --  26  GLUCOSE 136* 107*  BUN 30* 18  CREATININE 1.10* 0.97  CALCIUM  --  8.3*   Liver Function Tests: No results for input(s): AST, ALT, ALKPHOS, BILITOT, PROT, ALBUMIN in the last 168 hours. No results for input(s): LIPASE, AMYLASE in the last 168 hours. No results for input(s): AMMONIA in the last 168 hours. CBC:  Recent Labs Lab 01/03/15 0031 01/03/15 0037 01/03/15 0929 01/04/15 0423  WBC 14.1*  --  14.1* 12.2*  NEUTROABS 9.9*  --   --   --   HGB 15.4* 16.7* 13.3 12.4  HCT 47.8* 49.0* 41.5 39.5  MCV 82.6  --  82.0 82.8  PLT 345  --  278 262   Cardiac Enzymes: No results for input(s): CKTOTAL, CKMB, CKMBINDEX, TROPONINI in the last 168 hours. BNP (last 3 results) No results for input(s): PROBNP in the last 8760 hours. CBG: No results for input(s): GLUCAP in the last 168 hours.  No results found for this or any previous visit (from the past 240 hour(s)).       Studies: Dg Abd Acute W/chest  01/03/2015   CLINICAL DATA:  Bright red blood from rectum on our and a half ago. Constipation.  EXAM: DG ABDOMEN ACUTE W/ 1V CHEST  COMPARISON:  Chest 09/25/2013  FINDINGS: Shallow inspiration. Linear atelectasis in the right mid lung. Normal heart size and pulmonary vascularity. Mild central bronchiectasis with bronchial wall thickening suggesting chronic bronchitis. Calcification of the aorta.  Lumbar scoliosis convex towards the right. Large amount of stool in the rectum. Scattered gas is stool  throughout the remainder the colon. No small or large bowel distention. No free intra-abdominal air. No abnormal air-fluid levels. No radiopaque stones.  IMPRESSION: Shallow inspiration with atelectasis in the right mid lung. Chronic bronchitic changes in the lungs.  Nonobstructive bowel gas pattern. Large amount of stool seen in the rectum.   Electronically Signed   By: Lucienne Capers M.D.   On: 01/03/2015 00:54        Scheduled Meds: . brimonidine  1 drop Both Eyes Daily  . ciprofloxacin  400 mg Intravenous Q24H  . diltiazem  1 application Topical BID  . gabapentin  600 mg Oral QHS  . hydrocortisone  25 mg Rectal BID  . ipratropium-albuterol  3 mL Inhalation Q6H  . levETIRAcetam  500 mg Oral BID  . levothyroxine  88 mcg Oral QAC breakfast  . losartan  25 mg Oral Daily  . methadone  5 mg Oral 3 times per day  . mometasone-formoterol  2 puff Inhalation BID  . pantoprazole  40 mg Oral Daily  . polyethylene glycol  17 g Oral Daily   Continuous Infusions: .  dextrose 5 % 1,000 mL with potassium chloride 40 mEq infusion 75 mL/hr at 01/04/15 9233    Principal Problem:   BRBPR (bright red blood per rectum) Active Problems:   Alzheimer's disease   Long term current use of anticoagulant therapy   Breast cancer, left breast   Adult failure to thrive   Coumadin toxicity   Dehydration with hypernatremia   Pressure ulcer    Time spent: 20 minutes   Ivana Nicastro, MD, FACP, FHM. [facility] Pager [phone]  If 7PM-7AM, please contact night-coverage [email or web address] Password [facility] 01/04/2015, 6:34 PM    LOS: 1 day

## 2015-01-04 NOTE — Progress Notes (Signed)
Since having enema earlier today, patient has had a few very small BMs with dark red blood in them. Will continue to monitor.

## 2015-01-04 NOTE — Progress Notes (Signed)
Orders were added for patient to be in and out cath- when RN and NT went to do cath, patient has urinated a large amount.  Will not cath at this time. Informing NP.

## 2015-01-04 NOTE — Progress Notes (Signed)
Speech Language Pathology Treatment: Dysphagia  Patient Details Name: Jennifer Brennan MRN: [id number] DOB: June 13, 1924 Today's Date: 01/04/2015 Time: 0301-3143 SLP Time Calculation (min) (ACUTE ONLY): 14 min  Assessment / Plan / Recommendation Clinical Impression  Pt awake with daughter at bedside (different daughter from yesterday) and accepting po's. She required max multimodal cues during oral phase to manipulate and transit thin and puree due to oral holding (dry spoons, verbal cues). Daughter reported pt coughed with jello and broth earlier today. No coughing, throat clearing or wet vocal quality present although pt is at high aspiration risk given clinical findings and dementia. Daughter reported she has used a syringe to feed her mom and SLP provided educated and provided clinical reasoning re: the dangers/risks with use of syringes. Daughter was very receptive to feedback. SLP modified diet to Dys 1 and nectar thick liquids administered by teaspoon. Pt being followed by hospice care. Will follow briefly for continued family education.   HPI Other Pertinent Information: Jennifer Brennan is a 81 y.o. female currently on hospice for end stage dementia. Patient presents to the ED with 1 hour history of "pouring" BRBPR.  Pt with decreasing PO intake at home, FTT.  Daughter describes minimal intake over the last several months.  During the last two weeks, family has been using a syringe in an effort to encourage some PO intake.  Pt tends to hold POs orally.         Pertinent Vitals Pain Assessment: No/denies pain  SLP Plan  Continue with current plan of care    Recommendations Diet recommendations: Dysphagia 1 (puree);Nectar-thick liquid Liquids provided via: Teaspoon Medication Administration: Crushed with puree Supervision: Staff to assist with self feeding;Full supervision/cueing for compensatory strategies Compensations: Small sips/bites;Check for pocketing (use clean spoon for triggering  swallow) Postural Changes and/or Swallow Maneuvers: Seated upright 90 degrees              Oral Care Recommendations: Oral care QID Follow up Recommendations:  (TBD) Plan: Continue with current plan of care    GO     Houston Siren 01/04/2015, 1:27 PM  Orbie Pyo Colvin Caroli.Ed [employer] [phone]

## 2015-01-04 NOTE — Progress Notes (Signed)
[facility]on[facility] 5W-Room 13-[facility] [place] GIP Visit-Stacie Delice Lesch RN, BSN  This a related admission to her [facility] diagnosis of Alzheimer's Disease per Dr. Karie Georges. She is a DNR. Patient seen in room resting comfortably.  Her daughter is at bedside.  She is still receiving IV antibiotics. She is receiving 5mg  Methadone PO TID.  Family denies need for additional DME in the home at discharge.  Plan is to discharge back home with hospice when symptoms improve.  [facility] will continue to follow and anticipate any discharge needs.  Please call with any questions.  [facility] Liaison [phone]

## 2015-01-04 NOTE — Progress Notes (Signed)
Pt resting comfortably and able to verbally interact with daughter, Blanch Media.  Pt is an active [facility].  Pt lives in home with her husband and her daughter, Blanch Media.  Blanch Media is pt's primary caregiver.  Plan is for pt  To return home once she is ready for discharge.  Hospice will continue to follow.  Ollen Gross, [place]

## 2015-01-05 ENCOUNTER — Inpatient Hospital Stay (HOSPITAL_COMMUNITY)

## 2015-01-05 LAB — CBC
HCT: 39.8 % (ref 36.0–46.0)
HEMOGLOBIN: 12.9 g/dL (ref 12.0–15.0)
MCH: 26.8 pg (ref 26.0–34.0)
MCHC: 32.4 g/dL (ref 30.0–36.0)
MCV: 82.7 fL (ref 78.0–100.0)
PLATELETS: 231 10*3/uL (ref 150–400)
RBC: 4.81 MIL/uL (ref 3.87–5.11)
RDW: 16.1 % — AB (ref 11.5–15.5)
WBC: 18.6 10*3/uL — ABNORMAL HIGH (ref 4.0–10.5)

## 2015-01-05 LAB — BASIC METABOLIC PANEL
Anion gap: 8 (ref 5–15)
BUN: 14 mg/dL (ref 6–20)
CHLORIDE: 111 mmol/L (ref 101–111)
CO2: 26 mmol/L (ref 22–32)
CREATININE: 1.08 mg/dL — AB (ref 0.44–1.00)
Calcium: 8.9 mg/dL (ref 8.9–10.3)
GFR calc Af Amer: 51 mL/min — ABNORMAL LOW (ref >60)
GFR calc non Af Amer: 44 mL/min — ABNORMAL LOW (ref >60)
Glucose, Bld: 84 mg/dL (ref 65–99)
Potassium: 4.2 mmol/L (ref 3.5–5.1)
Sodium: 145 mmol/L (ref 135–145)

## 2015-01-05 LAB — PROTIME-INR
INR: 1.28 (ref 0.00–1.49)
PROTHROMBIN TIME: 16.1 s — AB (ref 11.6–15.2)

## 2015-01-05 MED ORDER — SENNA 8.6 MG PO TABS
2.0000 | ORAL_TABLET | Freq: Every day | ORAL | Status: DC
  Filled 2015-01-05 (×6): qty 2

## 2015-01-05 MED ORDER — MILK AND MOLASSES ENEMA
1.0000 | Freq: Once | RECTAL | Status: AC
  Administered 2015-01-05: 250 mL via RECTAL
  Filled 2015-01-05: qty 250

## 2015-01-05 MED ORDER — POLYETHYLENE GLYCOL 3350 17 G PO PACK
17.0000 g | PACK | Freq: Three times a day (TID) | ORAL | Status: DC
  Administered 2015-01-05 – 2015-01-10 (×4): 17 g via ORAL
  Filled 2015-01-05 (×16): qty 1

## 2015-01-05 NOTE — Progress Notes (Signed)
PROGRESS NOTE    Jennifer Brennan:[id number] DOB: 09-Apr-1924 DOA: 01/02/2015 PCP: Estill Dooms, MD  Primary cardiologist: Dr. Sherren Mocha  HPI/Brief narrative 81 y.o. female currently on hospice for end stage dementia, HTN, hypothyroid, recurrent DVTs & A. fib on chronic Coumadin, chronic back pain on opioids, CVA presented to [facility] ED on 7/12 with 1 hour history of "pouring" BRBPR.Patient also recently had an episode of vaginal bleeding, daughter called [name] who was "not concerned" per daughter. Hospice nurse was concerned about uterine cancer. Patient was also taken off of "the pill she took to prevent breast cancer from coming back" as well. As per family, prior episode of rectal bleeding last year. Coagulopathy, on admission with INR >10. Status post vitamin K 1 dose and rectal bleeding decreasing. As per discussion with patient's daughters at bedside, no aggressive workup i.e. no GI consultation or endoscopies.   Assessment/Plan:  Suspected acute lower GI bleed - DD: Hemorrhoidal related to fecal impaction (likely culprit), diverticulosis, polyps, AVMs, malignancy versus other etiology complicating coagulopathy secondary to Coumadin - As per family, has never had colonoscopy - Coagulopathy reversed with vitamin K 10 mg and Coumadin obviously held - Rectal bleeding improving. - Discussed extensively with patient's 2 daughters at bedside. They do not wish any aggressive workup i.e. colonoscopy or EGD. They checked with patient who also verbalized no aggressive workup. - Hemoglobin has dropped from 15.4 on admission to 13.3 on 7/13.  - Patient had some BM after an enema on 7/14 and small amount of blood but nothing like on admission. - Large BM following second enema on 7/15 without rectal bleeding.  Coagulopathy secondary to Coumadin - Likely related to poor oral intake from advanced dementia - Coumadin held and INR reversed with vitamin K - Discontinued  anticoagulation indefinitely-please see discussion below  Dehydration with hypernatremia - Treated with gentle IV fluid hydration. Hypernatremia and dehydration have improved/resolved. DC IV fluids. Encourage oral intake. Advised daughter [name] that patient is at risk for recurrent dehydration from fluctuating/poor oral intake.  Advanced dementia/failure to thrive - Is on home hospice. - Daughters agree that she will not be appropriate candidate for anticoagulation any longer due to significant GI bleed. Also advised them that this will put her at increased risk for strokes or VTE and they verbalize understanding and are agreeable to discontinuing anticoagulation indefinitely  History of recurrent DVTs/A. Fib - Discussion regarding anticoagulation as above  Essential hypertension - Mildly uncontrolled. Continue losartan  Chronic pain - Continue methadone  Hypothyroid - Continue Synthroid  Fecal impaction/constipation - Bowel regimen: Tried MOM on 7/13-no record of BM. Trial of milk and molasses enema 1: Small BM. Repeat KUB showed persisting fecal impaction - Milk and molasses enema on 7/15 with good effect - Follow KUB in a.m. Continue MiraLAX.  Presumed UTI - IV Cipro  Seizure disorder - Continue Keppra and gabapentin  Hypokalemia - Replaced    DVT prophylaxis: SCDs  Code Status: DO NOT RESUSCITATE  Family Communication: Discussed with patient's daughters [name] at bedside on 7/15  Disposition Plan: DC home with hospice when medically stable. Possible discharge home 7/16.  Consultants:  None  Procedures:  None   Antibiotics:  None   Subjective: Large BM following enema on 7/15. As per daughter, patient has significantly improved-more responsive. Eating more. Small blood following BM yesterday but none today.  Objective: Filed Vitals:   01/05/15 0135 01/05/15 0516 01/05/15 1051 01/05/15 1257  BP:  136/66 114/42 125/53  Pulse:  64 70 113  Temp:   98 F (36.7 C)  98.4 F (36.9 C)  TempSrc:    Oral  Resp:  18  24  Height:      Weight:      SpO2: 97% 98%  91%   blood pressure: 125/53 mmHg.  Intake/Output Summary (Last 24 hours) at 01/05/15 1707 Last data filed at 01/05/15 1416  Gross per 24 hour  Intake 1747.5 ml  Output      0 ml  Net 1747.5 ml   Filed Weights   01/03/15 0332  Weight: 63.6 kg (140 lb 3.4 oz)     Exam:  General exam: Elderly frail female patient sitting up in bed being fed by daughter. Opens eyes and looks around briefly. Respiratory system: Clear. No increased work of breathing. Cardiovascular system: S1 & S2 heard, RRR. No JVD, murmurs, gallops, clicks or pedal edema. Gastrointestinal system: Abdomen is nondistended, soft and nontender. Normal bowel sounds heard. Central nervous system: Alert and oriented only to self. No focal neurological deficits. Extremities: Symmetric 5 x 5 power.   Data Reviewed: Basic Metabolic Panel:  Recent Labs Lab 01/03/15 0037 01/04/15 0423 01/05/15 0948  NA 153* 148* 145  K 3.6 3.2* 4.2  CL 111 114* 111  CO2  --  26 26  GLUCOSE 136* 107* 84  BUN 30* 18 14  CREATININE 1.10* 0.97 1.08*  CALCIUM  --  8.3* 8.9   Liver Function Tests: No results for input(s): AST, ALT, ALKPHOS, BILITOT, PROT, ALBUMIN in the last 168 hours. No results for input(s): LIPASE, AMYLASE in the last 168 hours. No results for input(s): AMMONIA in the last 168 hours. CBC:  Recent Labs Lab 01/03/15 0031 01/03/15 0037 01/03/15 0929 01/04/15 0423 01/05/15 0948  WBC 14.1*  --  14.1* 12.2* 18.6*  NEUTROABS 9.9*  --   --   --   --   HGB 15.4* 16.7* 13.3 12.4 12.9  HCT 47.8* 49.0* 41.5 39.5 39.8  MCV 82.6  --  82.0 82.8 82.7  PLT 345  --  278 262 231   Cardiac Enzymes: No results for input(s): CKTOTAL, CKMB, CKMBINDEX, TROPONINI in the last 168 hours. BNP (last 3 results) No results for input(s): PROBNP in the last 8760 hours. CBG: No results for input(s): GLUCAP in the last  168 hours.  No results found for this or any previous visit (from the past 240 hour(s)).       Studies: Dg Abd Portable 1v  01/05/2015   CLINICAL DATA:  Fecal impaction.  EXAM: PORTABLE ABDOMEN - 1 VIEW  COMPARISON:  01/03/2015.  FINDINGS: Soft tissue structures are unremarkable. Large amount of stool is noted in the rectum consistent with fecal impaction. No bowel distention or free air. Severe thoracic spine scoliosis and degenerative change. Right hip replacement. Degenerative changes left hip. Left femoral head is slightly sclerotic. Avascular necrosis left femoral head cannot be excluded.  IMPRESSION: 1. Large amount of stool in the rectum consistent with fecal impaction. No bowel distention. 2. Severe scoliosis and degenerative change lumbar spine. Right hip replacement. Degenerative changes left hip. Mild sclerosis of the left femoral head noted, although this may be degenerative, avascular necrosis of left femoral head cannot be excluded.   Electronically Signed   By: Marcello Moores  Register   On: 01/05/2015 07:18        Scheduled Meds: . brimonidine  1 drop Both Eyes Daily  . ciprofloxacin  400 mg Intravenous Q24H  . diltiazem  1 application Topical BID  . gabapentin  600 mg Oral QHS  . hydrocortisone  25 mg Rectal BID  . ipratropium-albuterol  3 mL Inhalation Q6H  . levETIRAcetam  500 mg Oral BID  . levothyroxine  88 mcg Oral QAC breakfast  . losartan  25 mg Oral Daily  . methadone  5 mg Oral 3 times per day  . mometasone-formoterol  2 puff Inhalation BID  . pantoprazole  40 mg Oral Daily  . polyethylene glycol  17 g Oral TID  . senna  2 tablet Oral Daily   Continuous Infusions:    Principal Problem:   BRBPR (bright red blood per rectum) Active Problems:   Alzheimer's disease   Long term current use of anticoagulant therapy   Breast cancer, left breast   Adult failure to thrive   Coumadin toxicity   Dehydration with hypernatremia   Pressure ulcer    Time spent: 20  minutes   Nene Aranas, MD, FACP, FHM. [facility] Pager [phone]  If 7PM-7AM, please contact night-coverage [email or web address] Password [email or web address] 01/05/2015, 5:07 PM    LOS: 2 days

## 2015-01-05 NOTE — Progress Notes (Signed)
After administration of molasses enema, pt. Had a large,soft/mushy BM with no blood or streaks noted. For shift, pt. Has had 2 Large sized BM's and is continuing to have more. Some bleeding noted from vaginal area. Pt. May still be holding onto some of enema, due to her stomach being somewhat distended. Will report off to night shift nurse.

## 2015-01-05 NOTE — Progress Notes (Signed)
[facility] 5W-Room 13-[facility] [place]-RN GIP Visit-Stacie Delice Lesch RN, BSN  This a related admission to her [facility] diagnosis of Alzheimer's Disease. She is a DNR. Patient seen in room sleeping. Her daughter is at bedside. She is still receiving IV Cipro 400mg  once a day. She is receiving 5mg  Methadone PO TID. Family denies need for additional DME in the home at discharge. Daughter stated that the patient has not had a BM after receiving an enema yesterday.  Patient's diet has been changed to Dysphagia 1 with Nectar thick liquids.  RN stated she would educate the [facility] how to use the thick it, while in the hospital, and send her home with the container of thick it she is currently using.   Plan is to discharge back home with hospice when symptoms improve.  [facility] will continue to follow and anticipate any discharge needs.  Please call with any questions.  [facility] Liaison [phone]

## 2015-01-05 NOTE — Care Management Note (Signed)
Case Management Note  Patient Details  Name: Jennifer Brennan MRN: [id number] Date of Birth: Jul 27, 1923  Subjective/Objective:    Per MD plan is for dc to home with hospice tomorrow, NCM spoke with daughter and she states they will need ambuance for transport, NCM notified Belva Crome CSW.  NCM also called [facility] to let them know patient is for possible dc tomorrow.                Action/Plan:   Expected Discharge Date:  01/05/15               Expected Discharge Plan:  Home w Hospice Care  In-House Referral:  Clinical Social Work  Discharge planning Services  CM Consult  Post Acute Care Choice:  Resumption of Svcs/PTA Provider Choice offered to:  Adult Children  DME Arranged:    DME Agency:     HH Arranged:    HH Agency:  [facility]  Status of Service:  Completed, signed off  Medicare Important Message Given:    Date Medicare IM Given:    Medicare IM give by:    Date Additional Medicare IM Given:    Additional Medicare Important Message give by:     If discussed at [place] of Stay Meetings, dates discussed:    Additional Comments:  Zenon Mayo, RN 01/05/2015, 11:24 AM

## 2015-01-06 ENCOUNTER — Inpatient Hospital Stay (HOSPITAL_COMMUNITY)

## 2015-01-06 DIAGNOSIS — K567 Ileus, unspecified: Secondary | ICD-10-CM

## 2015-01-06 DIAGNOSIS — D72829 Elevated white blood cell count, unspecified: Secondary | ICD-10-CM

## 2015-01-06 LAB — CBC
HCT: 40 % (ref 36.0–46.0)
Hemoglobin: 13.1 g/dL (ref 12.0–15.0)
MCH: 26.3 pg (ref 26.0–34.0)
MCHC: 32.8 g/dL (ref 30.0–36.0)
MCV: 80.2 fL (ref 78.0–100.0)
PLATELETS: 290 10*3/uL (ref 150–400)
RBC: 4.99 MIL/uL (ref 3.87–5.11)
RDW: 15.9 % — ABNORMAL HIGH (ref 11.5–15.5)
WBC: 25.2 10*3/uL — ABNORMAL HIGH (ref 4.0–10.5)

## 2015-01-06 LAB — PROTIME-INR
INR: 1.32 (ref 0.00–1.49)
Prothrombin Time: 16.5 seconds — ABNORMAL HIGH (ref 11.6–15.2)

## 2015-01-06 MED ORDER — POTASSIUM CHLORIDE 2 MEQ/ML IV SOLN
INTRAVENOUS | Status: DC
  Administered 2015-01-06: 15:00:00 via INTRAVENOUS
  Filled 2015-01-06 (×3): qty 1000

## 2015-01-06 NOTE — Progress Notes (Signed)
[facility] HLT Social Work w/e visit: Jennifer Conte, LCSW  This is a hospice related admission, dx Alzheimer's Disease. Code Status: DNR  Met with daughter Jennifer Brennan at bedside while patient slept comfortably. Jennifer Brennan reports patient will not be returning home today. Jennifer Brennan is looking forward to going home to shower and change when patient's spouse arrives. Family maintaining a vigil to help orient patient.   Patient continues to receive IV ABX and PO methadone. Note IV team contacted to replace access. Note patient now NPO.  Place is for patient to return home with hospice when symptoms improve and medically stable.  [facility] will continue to follow and anticipate discharge needs.   Please call with questions.   Jennifer Brennan, [facility] Liaison [phone]

## 2015-01-06 NOTE — Progress Notes (Signed)
PROGRESS NOTE    Jennifer Brennan QIH:[id number] DOB: 08/04/1923 DOA: 01/02/2015 PCP: Estill Dooms, MD  Primary cardiologist: Dr. Sherren Mocha  HPI/Brief narrative 81 y.o. female currently on hospice for end stage dementia, HTN, hypothyroid, recurrent DVTs & A. fib on chronic Coumadin, chronic back pain on opioids, CVA presented to [facility] ED on 7/12 with 1 hour history of "pouring" BRBPR.Patient also recently had an episode of vaginal bleeding, daughter called [name] who was "not concerned" per daughter. Hospice nurse was concerned about uterine cancer. Patient was also taken off of "the pill she took to prevent breast cancer from coming back" as well. As per family, prior episode of rectal bleeding last year. Coagulopathy, on admission with INR >10. Status post vitamin K 1 dose and rectal bleeding decreasing. As per discussion with patient's daughters at bedside, no aggressive workup i.e. no GI consultation or endoscopies.   Assessment/Plan:  Suspected acute lower GI bleed - DD: Hemorrhoidal related to fecal impaction (likely culprit), diverticulosis, polyps, AVMs, malignancy versus other etiology complicating coagulopathy secondary to Coumadin - As per family, has never had colonoscopy - Coagulopathy reversed with vitamin K 10 mg and Coumadin discontinued - Discussed extensively with patient's 2 daughters at bedside. They do not wish any aggressive workup i.e. colonoscopy or EGD. They checked with patient who also verbalized no aggressive workup. - Patient had some BM after an enema on 7/14 and small amount of blood but nothing like on admission. - Large BM following second enema on 7/15 without rectal bleeding. Continuing to have watery BM/? Incontinence. - Follow-up abdominal x-ray 7/16 suggestive of colonic ileus - Rectal bleeding seems to have stopped. Hemoglobin stable.  Colonic ileus - Abdominal distention, nonbloody small emesis 1. - KUB suggesting colonic ileus -  Nothing by mouth except meds. Gentle IV fluids - Monitor clinically and with repeat KUB in a.m.  Coagulopathy secondary to Coumadin - Likely related to poor oral intake from advanced dementia - Coumadin held and INR reversed with vitamin K - Discontinued anticoagulation indefinitely-please see discussion below  Dehydration with hypernatremia - Treated with gentle IV fluid hydration. Hypernatremia and dehydration have improved/resolved. DC IV fluids. Encourage oral intake. Advised daughter [name] that patient is at risk for recurrent dehydration from fluctuating/poor oral intake.  Advanced dementia/failure to thrive - Is on home hospice. - Daughters agree that she will not be appropriate candidate for anticoagulation any longer due to significant GI bleed. Also advised them that this will put her at increased risk for strokes or VTE and they verbalize understanding and are agreeable to discontinuing anticoagulation indefinitely  History of recurrent DVTs/A. Fib - Discussion regarding anticoagulation as above  Essential hypertension - Mildly uncontrolled. Continue losartan  Chronic pain - Continue methadone  Hypothyroid - Continue Synthroid  Fecal impaction/constipation - Bowel regimen: Tried MOM on 7/13-no record of BM. Trial of milk and molasses enema 1: Small BM. Repeat KUB showed persisting fecal impaction - Milk and molasses enema on 7/15 with good effect - Continue MiraLAX. - Improving.  Presumed UTI - IV Cipro-unfortunately no urine culture was sent on admission. Consider stopping 7/17  Seizure disorder - Continue Keppra and gabapentin  Hypokalemia - Replaced  Leukocytosis - Unclear etiology. Current watery stools related to fecal impaction, overflow incontinence and enemas. If white cell continues to increase, may consider checking C. difficile PCR.    DVT prophylaxis: SCDs  Code Status: DO NOT RESUSCITATE  Family Communication: Discussed with patient's  daughters [name] at bedside  on 7/16  Disposition Plan: DC home with hospice when medically stable.   Consultants:  None  Procedures:  None   Antibiotics:  None   Subjective: Small nonbloody emesis 1. Abdominal distention without pain. The BM following an enema on 7/15 and since then intermittent incontinence of loose/watery stools.  Objective: Filed Vitals:   01/06/15 0614 01/06/15 0753 01/06/15 1344 01/06/15 1405  BP: 154/67   117/81  Pulse: 80   81  Temp: 98.7 F (37.1 C)   97.8 F (36.6 C)  TempSrc: Oral   Oral  Resp:    20  Height:      Weight:      SpO2: 97% 97% 97% 97%    Intake/Output Summary (Last 24 hours) at 01/06/15 1419 Last data filed at 01/06/15 1300  Gross per 24 hour  Intake      0 ml  Output      0 ml  Net      0 ml   Filed Weights   01/03/15 0332  Weight: 63.6 kg (140 lb 3.4 oz)     Exam:  General exam: Elderly frail female patient propped up in bed. Eyes open. Being attended to by daughter. Appears comfortable.Marland Kitchen Respiratory system: Clear. No increased work of breathing. Cardiovascular system: S1 & S2 heard, RRR. No JVD, murmurs, gallops, clicks or pedal edema. Gastrointestinal system: Abdomen is moderately distended but soft and nontender. Normal bowel sounds heard. Central nervous system: Alert and oriented only to self. No focal neurological deficits. Extremities: Symmetric 5 x 5 power.   Data Reviewed: Basic Metabolic Panel:  Recent Labs Lab 01/03/15 0037 01/04/15 0423 01/05/15 0948  NA 153* 148* 145  K 3.6 3.2* 4.2  CL 111 114* 111  CO2  --  26 26  GLUCOSE 136* 107* 84  BUN 30* 18 14  CREATININE 1.10* 0.97 1.08*  CALCIUM  --  8.3* 8.9   Liver Function Tests: No results for input(s): AST, ALT, ALKPHOS, BILITOT, PROT, ALBUMIN in the last 168 hours. No results for input(s): LIPASE, AMYLASE in the last 168 hours. No results for input(s): AMMONIA in the last 168 hours. CBC:  Recent Labs Lab 01/03/15 0031  01/03/15 0037 01/03/15 0929 01/04/15 0423 01/05/15 0948 01/06/15 0530  WBC 14.1*  --  14.1* 12.2* 18.6* 25.2*  NEUTROABS 9.9*  --   --   --   --   --   HGB 15.4* 16.7* 13.3 12.4 12.9 13.1  HCT 47.8* 49.0* 41.5 39.5 39.8 40.0  MCV 82.6  --  82.0 82.8 82.7 80.2  PLT 345  --  278 262 231 290   Cardiac Enzymes: No results for input(s): CKTOTAL, CKMB, CKMBINDEX, TROPONINI in the last 168 hours. BNP (last 3 results) No results for input(s): PROBNP in the last 8760 hours. CBG: No results for input(s): GLUCAP in the last 168 hours.  No results found for this or any previous visit (from the past 240 hour(s)).       Studies: Dg Abd Portable 1v  01/06/2015   CLINICAL DATA:  81 year old female with a history of fecal impaction  EXAM: PORTABLE ABDOMEN - 1 VIEW  COMPARISON:  Plain film 01/05/2015, abdomen 01/03/2015, prior CT 03/29/2013  FINDINGS: Compared to prior plain film there is increasing amount of colonic and small bowel dilation. Formed stool at the hepatic flexure. Less degree of formed stool in the rectal vault compared to the prior.  Mild dilation of central small bowel loops, increased from the prior.  No unexpected radiopaque foreign body.  Vascular calcifications.  Surgical changes of right total hip arthroplasty.  Re- demonstration of scoliotic curvature of the spine.  IMPRESSION: Compared to the prior plain film there is increasing degree of colonic gaseous dilation, though there appears to be decreased volume of formed stool in the rectal vault. Bowel gas pattern is nonspecific, though colonic ileus is favored. Surveillance with plain films recommended of this abnormal bowel gas pattern, or, if there is concern for acute abdomen, CT would be useful for further evaluation.  Signed,  Dulcy Fanny. Earleen Newport, DO  [facility]  [facility]   Electronically Signed   By: Corrie Mckusick D.O.   On: 01/06/2015 08:33   Dg Abd Portable 1v  01/05/2015    CLINICAL DATA:  Fecal impaction.  EXAM: PORTABLE ABDOMEN - 1 VIEW  COMPARISON:  01/03/2015.  FINDINGS: Soft tissue structures are unremarkable. Large amount of stool is noted in the rectum consistent with fecal impaction. No bowel distention or free air. Severe thoracic spine scoliosis and degenerative change. Right hip replacement. Degenerative changes left hip. Left femoral head is slightly sclerotic. Avascular necrosis left femoral head cannot be excluded.  IMPRESSION: 1. Large amount of stool in the rectum consistent with fecal impaction. No bowel distention. 2. Severe scoliosis and degenerative change lumbar spine. Right hip replacement. Degenerative changes left hip. Mild sclerosis of the left femoral head noted, although this may be degenerative, avascular necrosis of left femoral head cannot be excluded.   Electronically Signed   By: Marcello Moores  Register   On: 01/05/2015 07:18        Scheduled Meds: . brimonidine  1 drop Both Eyes Daily  . ciprofloxacin  400 mg Intravenous Q24H  . diltiazem  1 application Topical BID  . gabapentin  600 mg Oral QHS  . hydrocortisone  25 mg Rectal BID  . ipratropium-albuterol  3 mL Inhalation Q6H  . levETIRAcetam  500 mg Oral BID  . levothyroxine  88 mcg Oral QAC breakfast  . losartan  25 mg Oral Daily  . methadone  5 mg Oral 3 times per day  . mometasone-formoterol  2 puff Inhalation BID  . pantoprazole  40 mg Oral Daily  . polyethylene glycol  17 g Oral TID  . senna  2 tablet Oral Daily   Continuous Infusions: . dextrose 5 % 1,000 mL with potassium chloride 20 mEq infusion 50 mL/hr at 01/06/15 1117    Principal Problem:   BRBPR (bright red blood per rectum) Active Problems:   Alzheimer's disease   Long term current use of anticoagulant therapy   Breast cancer, left breast   Adult failure to thrive   Coumadin toxicity   Dehydration with hypernatremia   Pressure ulcer    Time spent: 20 minutes   Giomar Gusler, MD, FACP, FHM. [facility] Pager [phone]  If 7PM-7AM, please contact night-coverage [email or web address] Password [email or web address] 01/06/2015, 2:19 PM    LOS: 3 days

## 2015-01-07 ENCOUNTER — Inpatient Hospital Stay (HOSPITAL_COMMUNITY)

## 2015-01-07 LAB — CBC
HCT: 34.7 % — ABNORMAL LOW (ref 36.0–46.0)
Hemoglobin: 11.4 g/dL — ABNORMAL LOW (ref 12.0–15.0)
MCH: 26 pg (ref 26.0–34.0)
MCHC: 32.9 g/dL (ref 30.0–36.0)
MCV: 79.2 fL (ref 78.0–100.0)
Platelets: 253 10*3/uL (ref 150–400)
RBC: 4.38 MIL/uL (ref 3.87–5.11)
RDW: 15.8 % — ABNORMAL HIGH (ref 11.5–15.5)
WBC: 16.9 10*3/uL — ABNORMAL HIGH (ref 4.0–10.5)

## 2015-01-07 LAB — BASIC METABOLIC PANEL
ANION GAP: 6 (ref 5–15)
BUN: 14 mg/dL (ref 6–20)
CHLORIDE: 107 mmol/L (ref 101–111)
CO2: 25 mmol/L (ref 22–32)
CREATININE: 0.97 mg/dL (ref 0.44–1.00)
Calcium: 8.7 mg/dL — ABNORMAL LOW (ref 8.9–10.3)
GFR calc Af Amer: 58 mL/min — ABNORMAL LOW (ref >60)
GFR calc non Af Amer: 50 mL/min — ABNORMAL LOW (ref >60)
Glucose, Bld: 140 mg/dL — ABNORMAL HIGH (ref 65–99)
Potassium: 4 mmol/L (ref 3.5–5.1)
Sodium: 138 mmol/L (ref 135–145)

## 2015-01-07 LAB — PROTIME-INR
INR: 1.36 (ref 0.00–1.49)
PROTHROMBIN TIME: 16.9 s — AB (ref 11.6–15.2)

## 2015-01-07 MED ORDER — POTASSIUM CHLORIDE 2 MEQ/ML IV SOLN
INTRAVENOUS | Status: DC
  Administered 2015-01-07 – 2015-01-09 (×3): via INTRAVENOUS
  Filled 2015-01-07 (×5): qty 1000

## 2015-01-07 MED ORDER — SODIUM CHLORIDE 0.9 % IV SOLN
500.0000 mg | Freq: Once | INTRAVENOUS | Status: AC
  Administered 2015-01-07: 500 mg via INTRAVENOUS
  Filled 2015-01-07: qty 5

## 2015-01-07 MED ORDER — METHADONE HCL 10 MG PO TABS: 5.0000 mg | ORAL_TABLET | Freq: Three times a day (TID) | ORAL | Status: DC

## 2015-01-07 MED ORDER — IPRATROPIUM-ALBUTEROL 0.5-2.5 (3) MG/3ML IN SOLN
3.0000 mL | Freq: Two times a day (BID) | RESPIRATORY_TRACT | Status: DC
  Administered 2015-01-08 – 2015-01-10 (×5): 3 mL via RESPIRATORY_TRACT
  Filled 2015-01-07 (×5): qty 3

## 2015-01-07 NOTE — Progress Notes (Signed)
PROGRESS NOTE    Jennifer Brennan HUT:[id number] DOB: 11-May-1924 DOA: 01/02/2015 PCP: Estill Dooms, MD  Primary cardiologist: Dr. Sherren Mocha  HPI/Brief narrative 81 y.o. female currently on hospice for end stage dementia, HTN, hypothyroid, recurrent DVTs & A. fib on chronic Coumadin, chronic back pain on opioids, CVA presented to [facility] ED on 7/12 with 1 hour history of "pouring" BRBPR.Patient also recently had an episode of vaginal bleeding, daughter called [name] who was "not concerned" per daughter. Hospice nurse was concerned about uterine cancer. Patient was also taken off of "the pill she took to prevent breast cancer from coming back" as well. As per family, prior episode of rectal bleeding last year. Coagulopathy, on admission with INR >10. Status post vitamin K 1 dose and rectal bleeding decreasing. As per discussion with patient's daughters at bedside, no aggressive workup i.e. no GI consultation or endoscopies.   Assessment/Plan:  Suspected acute lower GI bleed - DD: Hemorrhoidal related to fecal impaction (likely culprit), diverticulosis, polyps, AVMs, malignancy versus other etiology complicating coagulopathy secondary to Coumadin - As per family, has never had colonoscopy - Coagulopathy reversed with vitamin K 10 mg and Coumadin discontinued - Discussed extensively with patient's 2 daughters at bedside. They do not wish any aggressive workup i.e. colonoscopy or EGD. They checked with patient who also verbalized no aggressive workup. - Patient had some BM after an enema on 7/14 and small amount of blood but nothing like on admission. - Large BM following second enema on 7/15 without rectal bleeding. Continuing to have watery BM/? Incontinence. - Follow-up abdominal x-ray 7/16 suggestive of colonic ileus - Rectal bleeding seems to have stopped. Hemoglobin has dropped from 13-11 in the absence of reported bleeding. Follow CBC in a.m.  Colonic ileus - Abdominal  distention, nonbloody small emesis 1 7/16. - Nothing by mouth except meds. Gentle IV fluids. Ileus persists clinically and on x-ray. - Monitor clinically and with repeat KUB in a.m.  Coagulopathy secondary to Coumadin - Likely related to poor oral intake from advanced dementia - Coumadin held and INR reversed with vitamin K - Discontinued anticoagulation indefinitely-please see discussion below  Dehydration with hypernatremia - Treated with gentle IV fluid hydration. Hypernatremia and dehydration have improved/resolved. DC IV fluids. Encourage oral intake. Advised daughter [name] that patient is at risk for recurrent dehydration from fluctuating/poor oral intake.  Advanced dementia/failure to thrive - Is on home hospice. - Daughters agree that she will not be appropriate candidate for anticoagulation any longer due to significant GI bleed. Also advised them that this will put her at increased risk for strokes or VTE and they verbalize understanding and are agreeable to discontinuing anticoagulation indefinitely  History of recurrent DVTs/A. Fib - Discussion regarding anticoagulation as above  Essential hypertension - Mildly uncontrolled. Continue losartan  Chronic pain - Continue methadone  Hypothyroid - Continue Synthroid  Fecal impaction/constipation - Bowel regimen: Tried MOM on 7/13-no record of BM. Trial of milk and molasses enema 1: Small BM. Repeat KUB showed persisting fecal impaction - Milk and molasses enema on 7/15 with good effect - Continue MiraLAX. - Improving.  Presumed UTI - IV Cipro-unfortunately no urine culture was sent on admission. Consider stopping 7/17  Seizure disorder - Continue Keppra and gabapentin  Hypokalemia - Replaced  Leukocytosis - Unclear etiology. Current watery stools related to fecal impaction, overflow incontinence and enemas. If white cell continues to increase, may consider checking C. difficile PCR. - Decreasing.    DVT  prophylaxis: SCDs  Code Status: DO NOT RESUSCITATE  Family Communication: Discussed with patient's daughters [name] at bedside on 7/17  Disposition Plan: DC home with hospice when medically stable.   Consultants:  None  Procedures:  None   Antibiotics:  None   Subjective: As per daughter and nursing, patient had several intermittent loose watery stools. No vomiting reported. Refusing to eat or take medications.  Objective: Filed Vitals:   01/07/15 0724 01/07/15 0740 01/07/15 1438 01/07/15 1453  BP: 153/58  137/70   Pulse:   64   Temp:   98.5 F (36.9 C)   TempSrc:      Resp:   18   Height:      Weight:      SpO2:  96% 98% 96%    Intake/Output Summary (Last 24 hours) at 01/07/15 1545 Last data filed at 01/07/15 1313  Gross per 24 hour  Intake 574.17 ml  Output      0 ml  Net 574.17 ml   Filed Weights   01/03/15 0332  Weight: 63.6 kg (140 lb 3.4 oz)     Exam:  General exam: Elderly frail female patient lying comfortably in bed. Respiratory system: Clear. No increased work of breathing. Cardiovascular system: S1 & S2 heard, RRR. No JVD, murmurs, gallops, clicks or pedal edema. Gastrointestinal system: Abdomen is moderately distended but soft and nontender. Diminished/tinkling bowel sounds heard. Central nervous system: Alert and oriented only to self and place. No focal neurological deficits. Extremities: Symmetric 5 x 5 power.   Data Reviewed: Basic Metabolic Panel:  Recent Labs Lab 01/03/15 0037 01/04/15 0423 01/05/15 0948 01/07/15 1152  NA 153* 148* 145 138  K 3.6 3.2* 4.2 4.0  CL 111 114* 111 107  CO2  --  26 26 25   GLUCOSE 136* 107* 84 140*  BUN 30* 18 14 14   CREATININE 1.10* 0.97 1.08* 0.97  CALCIUM  --  8.3* 8.9 8.7*   Liver Function Tests: No results for input(s): AST, ALT, ALKPHOS, BILITOT, PROT, ALBUMIN in the last 168 hours. No results for input(s): LIPASE, AMYLASE in the last 168 hours. No results for input(s): AMMONIA in the  last 168 hours. CBC:  Recent Labs Lab 01/03/15 0031  01/03/15 0929 01/04/15 0423 01/05/15 0948 01/06/15 0530 01/07/15 1152  WBC 14.1*  --  14.1* 12.2* 18.6* 25.2* 16.9*  NEUTROABS 9.9*  --   --   --   --   --   --   HGB 15.4*  < > 13.3 12.4 12.9 13.1 11.4*  HCT 47.8*  < > 41.5 39.5 39.8 40.0 34.7*  MCV 82.6  --  82.0 82.8 82.7 80.2 79.2  PLT 345  --  278 262 231 290 253  < > = values in this interval not displayed. Cardiac Enzymes: No results for input(s): CKTOTAL, CKMB, CKMBINDEX, TROPONINI in the last 168 hours. BNP (last 3 results) No results for input(s): PROBNP in the last 8760 hours. CBG: No results for input(s): GLUCAP in the last 168 hours.  No results found for this or any previous visit (from the past 240 hour(s)).       Studies: Dg Abd Portable 1v  01/07/2015   CLINICAL DATA:  Ileus.  EXAM: PORTABLE ABDOMEN - 1 VIEW  COMPARISON:  01/06/2015  FINDINGS: There is persistent mild dilatation of bowel loops, primarily colonic. No obvious free intraperitoneal air on this supine view.  IMPRESSION: Persistent mild large bowel dilatation.   Electronically Signed   By: Nolon Nations  M.D.   On: 01/07/2015 08:46   Dg Abd Portable 1v  01/06/2015   CLINICAL DATA:  81 year old female with a history of fecal impaction  EXAM: PORTABLE ABDOMEN - 1 VIEW  COMPARISON:  Plain film 01/05/2015, abdomen 01/03/2015, prior CT 03/29/2013  FINDINGS: Compared to prior plain film there is increasing amount of colonic and small bowel dilation. Formed stool at the hepatic flexure. Less degree of formed stool in the rectal vault compared to the prior.  Mild dilation of central small bowel loops, increased from the prior.  No unexpected radiopaque foreign body.  Vascular calcifications.  Surgical changes of right total hip arthroplasty.  Re- demonstration of scoliotic curvature of the spine.  IMPRESSION: Compared to the prior plain film there is increasing degree of colonic gaseous dilation, though  there appears to be decreased volume of formed stool in the rectal vault. Bowel gas pattern is nonspecific, though colonic ileus is favored. Surveillance with plain films recommended of this abnormal bowel gas pattern, or, if there is concern for acute abdomen, CT would be useful for further evaluation.  Signed,  Dulcy Fanny. Earleen Newport, DO  [facility]  [facility]   Electronically Signed   By: Corrie Mckusick D.O.   On: 01/06/2015 08:33        Scheduled Meds: . brimonidine  1 drop Both Eyes Daily  . ciprofloxacin  400 mg Intravenous Q24H  . diltiazem  1 application Topical BID  . gabapentin  600 mg Oral QHS  . hydrocortisone  25 mg Rectal BID  . ipratropium-albuterol  3 mL Inhalation Q6H  . levETIRAcetam  500 mg Oral BID  . levothyroxine  88 mcg Oral QAC breakfast  . losartan  25 mg Oral Daily  . methadone  5 mg Oral 3 times per day  . mometasone-formoterol  2 puff Inhalation BID  . pantoprazole  40 mg Oral Daily  . polyethylene glycol  17 g Oral TID  . senna  2 tablet Oral Daily   Continuous Infusions: . dextrose 5 % 1,000 mL with potassium chloride 20 mEq infusion 50 mL/hr at 01/07/15 0940    Principal Problem:   BRBPR (bright red blood per rectum) Active Problems:   Alzheimer's disease   Long term current use of anticoagulant therapy   Breast cancer, left breast   Adult failure to thrive   Coumadin toxicity   Dehydration with hypernatremia   Pressure ulcer    Time spent: 20 minutes   Cleon Thoma, MD, FACP, FHM. [facility] Pager [phone]  If 7PM-7AM, please contact night-coverage [email or web address] Password [email or web address] 01/07/2015, 3:45 PM    LOS: 4 days

## 2015-01-07 NOTE — Progress Notes (Signed)
Adonis Huguenin [facility] 5W-Room 13-[facility]-RN GIP Visit-Stacie Delice Lesch RN, BSN  This a related admission to her [facility] diagnosis of Alzheimer's Disease. She is a DNR. Patient seen in room resting with eyes closed and appears comfortable. Spouse is at bedside.  He stated they are doing "ok". Patient is not eating or swallowing medications.  Staff RN reported that patient has been lethargic today.  She is still receiving IV antibiotics and PO Methadone.  RN reported that patient is passing gas and has not had anymore bloody stools.  [facility] will continue to follow and anticipate any discharge needs.  Please call with any questions.  [facility] Liaison [phone]

## 2015-01-08 LAB — CBC
HCT: 36.1 % (ref 36.0–46.0)
Hemoglobin: 11.7 g/dL — ABNORMAL LOW (ref 12.0–15.0)
MCH: 25.8 pg — ABNORMAL LOW (ref 26.0–34.0)
MCHC: 32.4 g/dL (ref 30.0–36.0)
MCV: 79.7 fL (ref 78.0–100.0)
PLATELETS: 226 10*3/uL (ref 150–400)
RBC: 4.53 MIL/uL (ref 3.87–5.11)
RDW: 15.9 % — ABNORMAL HIGH (ref 11.5–15.5)
WBC: 12.6 10*3/uL — ABNORMAL HIGH (ref 4.0–10.5)

## 2015-01-08 LAB — BASIC METABOLIC PANEL
ANION GAP: 7 (ref 5–15)
BUN: 8 mg/dL (ref 6–20)
CALCIUM: 8.9 mg/dL (ref 8.9–10.3)
CO2: 26 mmol/L (ref 22–32)
Chloride: 104 mmol/L (ref 101–111)
Creatinine, Ser: 0.91 mg/dL (ref 0.44–1.00)
GFR calc Af Amer: >60 mL/min (ref >60)
GFR calc non Af Amer: 54 mL/min — ABNORMAL LOW (ref >60)
GLUCOSE: 115 mg/dL — AB (ref 65–99)
Potassium: 4.2 mmol/L (ref 3.5–5.1)
Sodium: 137 mmol/L (ref 135–145)

## 2015-01-08 LAB — PROTIME-INR
INR: 1.56 — AB (ref 0.00–1.49)
Prothrombin Time: 18.7 seconds — ABNORMAL HIGH (ref 11.6–15.2)

## 2015-01-08 MED ORDER — FENTANYL CITRATE (PF) 100 MCG/2ML IJ SOLN
12.5000 ug | INTRAMUSCULAR | Status: DC | PRN
  Administered 2015-01-08: 12.5 ug via INTRAVENOUS
  Filled 2015-01-08: qty 2

## 2015-01-08 MED ORDER — LEVETIRACETAM 500 MG/5ML IV SOLN
500.0000 mg | Freq: Two times a day (BID) | INTRAVENOUS | Status: DC
  Administered 2015-01-08 – 2015-01-10 (×5): 500 mg via INTRAVENOUS
  Filled 2015-01-08 (×7): qty 5

## 2015-01-08 NOTE — Progress Notes (Signed)
Jennifer Brennan [facility] 5W-Room 13-[facility] [place]-RN GIP Visit-Stacie Delice Lesch RN, BSN  This a related admission to her [facility] diagnosis of Alzheimer's Disease. She is a DNR. Patient seen in room resting comfortably.  Daughter is at bedside and reports that patient has been significantly better today and much more alert.  She stated that she had made multiple phone calls today to family members and engaged with a cheerful disposition.  She is very tired at time of visit. Patient is currently being treated for a colonic ileus with NPO and gentle IV fluids via PIV.  Plan is to discharge back home with hospice.  Please call with any questions.  [facility] Liaison [phone]

## 2015-01-08 NOTE — Progress Notes (Signed)
PROGRESS NOTE    Jennifer Brennan EYC:[id number] DOB: 1923/09/15 DOA: 01/02/2015 PCP: Estill Dooms, MD  Primary cardiologist: Dr. Sherren Mocha  HPI/Brief narrative 81 y.o. female currently on hospice for end stage dementia, HTN, hypothyroid, recurrent DVTs & A. fib on chronic Coumadin, chronic back pain on opioids, CVA presented to [facility] ED on 7/12 with 1 hour history of "pouring" BRBPR.Patient also recently had an episode of vaginal bleeding, daughter called [name] who was "not concerned" per daughter. Hospice nurse was concerned about uterine cancer. Patient was also taken off of "the pill she took to prevent breast cancer from coming back" as well. As per family, prior episode of rectal bleeding last year. Coagulopathy, on admission with INR >10. Status post vitamin K 1 dose and rectal bleeding decreasing. As per discussion with patient's daughters at bedside, no aggressive workup i.e. no GI consultation or endoscopies. Rectal bleeding resolved. Fecal impaction treated with enemas with good results. Subsequently developed colonic ileus.   Assessment/Plan:  Suspected acute lower GI bleed - DD: Hemorrhoidal related to fecal impaction (likely culprit), diverticulosis, polyps, AVMs, malignancy versus other etiology complicating coagulopathy secondary to Coumadin - As per family, has never had colonoscopy - Coagulopathy reversed with vitamin K 10 mg and Coumadin discontinued - Discussed extensively with patient's 2 daughters at bedside. They do not wish any aggressive workup i.e. colonoscopy or EGD. They checked with patient who also verbalized no aggressive workup. - Patient had some BM after an enema on 7/14 and small amount of blood but nothing like on admission. - Large BM following second enema on 7/15 without rectal bleeding. Continuing to have watery BM/? Incontinence. - Follow-up abdominal x-ray 7/16 suggestive of colonic ileus - Rectal bleeding seems to have stopped.  Hemoglobin has dropped from 13-11 in the absence of reported bleeding. Hemoglobin stable in the 11 g range over last 24 hours  Colonic ileus - Abdominal distention, nonbloody small emesis 1 7/16. - Nothing by mouth except meds. Gentle IV fluids. Ileus persists clinically and on x-ray. - Persisting clinical ileus. Continue conservative management. - Unable to mobilize (bedbound at home). Discontinue methadone. Potassium >4.  Coagulopathy secondary to Coumadin - Likely related to poor oral intake from advanced dementia - Coumadin held and INR reversed with vitamin K - Discontinued anticoagulation indefinitely-please see discussion below  Dehydration with hypernatremia - Treated with gentle IV fluid hydration. Hypernatremia and dehydration have improved/resolved. DC IV fluids. Encourage oral intake. Advised daughter Mrs. Jennifer Brennan that patient is at risk for recurrent dehydration from fluctuating/poor oral intake.  Advanced dementia/failure to thrive - Is on home hospice. - Daughters agree that she will not be appropriate candidate for anticoagulation any longer due to significant GI bleed. Also advised them that this will put her at increased risk for strokes or VTE and they verbalize understanding and are agreeable to discontinuing anticoagulation indefinitely  History of recurrent DVTs/A. Fib - Discussion regarding anticoagulation as above  Essential hypertension - Mildly uncontrolled. Continue losartan  Chronic pain - Methadone held. Patient not complaining of significant pain.  Hypothyroid - Continue Synthroid  Fecal impaction/constipation - Bowel regimen: Tried MOM on 7/13-no record of BM. Trial of milk and molasses enema 1: Small BM. Repeat KUB showed persisting fecal impaction - Milk and molasses enema on 7/15 with good effect - Continue MiraLAX. - Improving.  Presumed UTI - IV Cipro-unfortunately no urine culture was sent on admission. Cipro discontinued.  Seizure  disorder - Continue Keppra and gabapentin - Due to patient  not consistently taking medications by mouth. Switched Keppra to IV.  Hypokalemia - Replaced  Leukocytosis - Unclear etiology. Current watery stools related to fecal impaction, overflow incontinence and enemas. If white cell continues to increase, may consider checking C. difficile PCR. - Decreasing.    DVT prophylaxis: SCDs  Code Status: DO NOT RESUSCITATE  Family Communication: Discussed with patient's daughters Mrs. Jennifer Brennan at bedside on 7/18  Disposition Plan: DC home with hospice when medically stable.   Consultants:  None  Procedures:  None   Antibiotics:  None   Subjective: As per daughter, no BM in greater than 24 hours. No flatus reported. No abdominal pain. As per nursing, refusing to take medications.  Objective: Filed Vitals:   01/08/15 0436 01/08/15 0920 01/08/15 1000 01/08/15 1445  BP: 130/59  116/53 169/77  Pulse: 63  67 63  Temp: 98.2 F (36.8 C)  98.4 F (36.9 C) 98.1 F (36.7 C)  TempSrc: Oral  Axillary Oral  Resp: 17  24 20   Height:      Weight:      SpO2: 100% 97% 100% 100%    Intake/Output Summary (Last 24 hours) at 01/08/15 1608 Last data filed at 01/08/15 0857  Gross per 24 hour  Intake 1016.67 ml  Output      0 ml  Net 1016.67 ml   Filed Weights   01/03/15 0332  Weight: 63.6 kg (140 lb 3.4 oz)     Exam:  General exam: Elderly frail female patient lying comfortably in bed. Respiratory system: Clear. No increased work of breathing. Cardiovascular system: S1 & S2 heard, RRR. No JVD, murmurs, gallops, clicks or pedal edema. Gastrointestinal system: Abdomen is moderately distended but soft and nontender. Diminished bowel sounds. Central nervous system: Alert and oriented only to self and place. No focal neurological deficits. Extremities: Symmetric 5 x 5 power.   Data Reviewed: Basic Metabolic Panel:  Recent Labs Lab 01/03/15 0037 01/04/15 0423 01/05/15 0948  01/07/15 1152 01/08/15 0726  NA 153* 148* 145 138 137  K 3.6 3.2* 4.2 4.0 4.2  CL 111 114* 111 107 104  CO2  --  26 26 25 26   GLUCOSE 136* 107* 84 140* 115*  BUN 30* 18 14 14 8   CREATININE 1.10* 0.97 1.08* 0.97 0.91  CALCIUM  --  8.3* 8.9 8.7* 8.9   Liver Function Tests: No results for input(s): AST, ALT, ALKPHOS, BILITOT, PROT, ALBUMIN in the last 168 hours. No results for input(s): LIPASE, AMYLASE in the last 168 hours. No results for input(s): AMMONIA in the last 168 hours. CBC:  Recent Labs Lab 01/03/15 0031  01/04/15 0423 01/05/15 0948 01/06/15 0530 01/07/15 1152 01/08/15 0726  WBC 14.1*  < > 12.2* 18.6* 25.2* 16.9* 12.6*  NEUTROABS 9.9*  --   --   --   --   --   --   HGB 15.4*  < > 12.4 12.9 13.1 11.4* 11.7*  HCT 47.8*  < > 39.5 39.8 40.0 34.7* 36.1  MCV 82.6  < > 82.8 82.7 80.2 79.2 79.7  PLT 345  < > 262 231 290 253 226  < > = values in this interval not displayed. Cardiac Enzymes: No results for input(s): CKTOTAL, CKMB, CKMBINDEX, TROPONINI in the last 168 hours. BNP (last 3 results) No results for input(s): PROBNP in the last 8760 hours. CBG: No results for input(s): GLUCAP in the last 168 hours.  No results found for this or any previous visit (from the past 240 hour(s)).  Studies: Dg Abd Portable 1v  01/07/2015   CLINICAL DATA:  Ileus.  EXAM: PORTABLE ABDOMEN - 1 VIEW  COMPARISON:  01/06/2015  FINDINGS: There is persistent mild dilatation of bowel loops, primarily colonic. No obvious free intraperitoneal air on this supine view.  IMPRESSION: Persistent mild large bowel dilatation.   Electronically Signed   By: Nolon Nations M.D.   On: 01/07/2015 08:46        Scheduled Meds: . brimonidine  1 drop Both Eyes Daily  . diltiazem  1 application Topical BID  . gabapentin  600 mg Oral QHS  . hydrocortisone  25 mg Rectal BID  . ipratropium-albuterol  3 mL Inhalation BID  . levETIRAcetam  500 mg Intravenous Q12H  . levothyroxine  88 mcg Oral QAC  breakfast  . losartan  25 mg Oral Daily  . methadone  5 mg Oral 3 times per day  . mometasone-formoterol  2 puff Inhalation BID  . pantoprazole  40 mg Oral Daily  . polyethylene glycol  17 g Oral TID  . senna  2 tablet Oral Daily   Continuous Infusions: . dextrose 5 % 1,000 mL with potassium chloride 20 mEq infusion 50 mL/hr at 01/08/15 1319    Principal Problem:   BRBPR (bright red blood per rectum) Active Problems:   Alzheimer's disease   Long term current use of anticoagulant therapy   Breast cancer, left breast   Adult failure to thrive   Coumadin toxicity   Dehydration with hypernatremia   Pressure ulcer    Time spent: 20 minutes   Trina Asch, MD, FACP, FHM. [facility] Pager [phone]  If 7PM-7AM, please contact night-coverage [email or web address] Password [email or web address] 01/08/2015, 4:08 PM    LOS: 5 days

## 2015-01-09 ENCOUNTER — Inpatient Hospital Stay (HOSPITAL_COMMUNITY)

## 2015-01-09 LAB — PROTIME-INR
INR: 1.25 (ref 0.00–1.49)
Prothrombin Time: 15.8 seconds — ABNORMAL HIGH (ref 11.6–15.2)

## 2015-01-09 MED ORDER — MILK AND MOLASSES ENEMA
1.0000 | Freq: Once | RECTAL | Status: AC
  Administered 2015-01-09: 250 mL via RECTAL
  Filled 2015-01-09: qty 250

## 2015-01-09 NOTE — Progress Notes (Signed)
[facility]-Room 13-[facility]-RN GIP Visit-Lisa Strandberg RN  This a related admission to her [facility] diagnosis of Alzheimer's Disease. She is a DNR.Patient is resting comfortably in her bed with her husband at her bedside. Patient denies any pain. Patient is asking when she can go home. Staff nurse, Tiffany reports pt. Is starting on a clear liquid diet today. She reports no d/c plan has been made yet. Tiffany advised of pt's husband's request to try some PT with the patient. Staff Nurse will request an order from the MD and pt.'s spouse advised of this. Plan is to discharge back home with hospice.  Please call with any questions.  [facility] Liaison [phone]

## 2015-01-09 NOTE — Progress Notes (Signed)
PROGRESS NOTE    Jennifer Brennan:[id number] DOB: 1924-06-16 DOA: 01/02/2015 PCP: Jennifer Dooms, MD  Primary cardiologist: Dr. Sherren Brennan  HPI/Brief narrative 81 y.o. female currently on hospice for end stage dementia, HTN, hypothyroid, recurrent DVTs & A. fib on chronic Coumadin, chronic back pain on opioids, CVA presented to [facility] ED on 7/12 with rectal bleeding in the context of coagulopathy from Coumadin and fecal impaction. Coagulopathy was reversed with vitamin K. Anticoagulation indefinitely discontinued after discussing with family. Couple of enemas with good effect. Subsequent ileus -  treated supportively. Bowel sounds improved, KUB-no further ileus. Resuming clear liquids 7/19 and advance as tolerated. As per discussion with patient's daughters at bedside, no aggressive workup i.e. no GI consultation or endoscopies. Repeating enema 7/19. DC home with hospice when tolerating diet.  Assessment/Plan:  Suspected acute lower GI bleed - DD: Hemorrhoidal related to fecal impaction (likely culprit), diverticulosis, polyps, AVMs, malignancy versus other etiology complicating coagulopathy secondary to Coumadin - As per family, has never had colonoscopy - Coagulopathy reversed with vitamin K 10 mg and Coumadin discontinued - Discussed extensively with patient's 2 daughters at bedside. They do not wish any aggressive workup i.e. colonoscopy or EGD. They checked with patient who also verbalized no aggressive workup. - Rectal bleeding resolved. - Hemoglobin stable in the 11 g range.  Colonic ileus - Abdominal distention, nonbloody small emesis 1 7/16. - Treated supportively with bowel rest, IV fluids and minimizing pain medications. - Seems to have clinically and radiologically resolved. Start clears and advance diet as tolerated.  Coagulopathy secondary to Coumadin - Likely related to poor oral intake from advanced dementia - Coumadin held and INR reversed with vitamin K -  Discontinued anticoagulation indefinitely-please see discussion below  Dehydration with hypernatremia - Treated with gentle IV fluid hydration. Hypernatremia and dehydration have improved/resolved. DC IV fluids. Encourage oral intake. Advised daughter Jennifer Brennan that patient is at risk for recurrent dehydration from fluctuating/poor oral intake.  Advanced dementia/failure to thrive - Is on home hospice. - Daughters agree that she will not be appropriate candidate for anticoagulation any longer due to significant GI bleed. Also advised them that this will put her at increased risk for strokes or VTE and they verbalize understanding and are agreeable to discontinuing anticoagulation indefinitely - If she does not improve in the next 24-48 hours, please consult palliative care team for [place] with family.  History of recurrent DVTs/A. Fib - Discussion regarding anticoagulation as above  Essential hypertension - Mildly uncontrolled. Continue losartan  Chronic pain - Methadone held. Patient not complaining of significant pain.  Hypothyroid - Continue Synthroid  Fecal impaction/constipation - Treated with a couple of milk and molasses enema's with good effect. - Continue MiraLAX. - KUB 7/19 shows stool in rectum. Will repeat enema today.  Presumed UTI - IV Cipro-unfortunately no urine culture was sent on admission. Cipro discontinued.  Seizure disorder - Continue Keppra and gabapentin - Due to patient not consistently taking medications by mouth. Switched Keppra to IV.  Hypokalemia - Replaced  Leukocytosis - Unclear etiology. Current watery stools related to fecal impaction, overflow incontinence and enemas. If white cell continues to increase, may consider checking C. difficile PCR. - Decreasing.    DVT prophylaxis: SCDs  Code Status: DO NOT RESUSCITATE  Family Communication: Discussed with patient's daughters Jennifer Brennan at bedside on 7/19  Disposition Plan: DC home with hospice  when medically stable-hopefully in the next 1-2 days..   Consultants:  None  Procedures:  None  Antibiotics:  None   Subjective: Flatus + +. No vomiting. Abdominal distention improved.  Objective: Filed Vitals:   01/08/15 2139 01/09/15 0642 01/09/15 0908 01/09/15 1400  BP:  152/64  142/59  Pulse: 60 49  65  Temp:  97.9 F (36.6 C)  98.3 F (36.8 C)  TempSrc:  Oral  Oral  Resp: 16 18  18   Height:      Weight:      SpO2:  99% 88% 100%    Intake/Output Summary (Last 24 hours) at 01/09/15 1526 Last data filed at 01/09/15 1300  Gross per 24 hour  Intake   1530 ml  Output      0 ml  Net   1530 ml   Filed Weights   01/03/15 0332  Weight: 63.6 kg (140 lb 3.4 oz)     Exam:  General exam: Elderly frail female patient lying comfortably in bed. Respiratory system: Clear. No increased work of breathing. Cardiovascular system: S1 & S2 heard, RRR. No JVD, murmurs, gallops, clicks or pedal edema. Gastrointestinal system: Abdomen is nondistended, soft and nontender. Normal bowel sounds. Central nervous system: Alert and oriented only to self and place. No focal neurological deficits. Extremities: Symmetric 5 x 5 power.   Data Reviewed: Basic Metabolic Panel:  Recent Labs Lab 01/03/15 0037 01/04/15 0423 01/05/15 0948 01/07/15 1152 01/08/15 0726  NA 153* 148* 145 138 137  K 3.6 3.2* 4.2 4.0 4.2  CL 111 114* 111 107 104  CO2  --  26 26 25 26   GLUCOSE 136* 107* 84 140* 115*  BUN 30* 18 14 14 8   CREATININE 1.10* 0.97 1.08* 0.97 0.91  CALCIUM  --  8.3* 8.9 8.7* 8.9   Liver Function Tests: No results for input(s): AST, ALT, ALKPHOS, BILITOT, PROT, ALBUMIN in the last 168 hours. No results for input(s): LIPASE, AMYLASE in the last 168 hours. No results for input(s): AMMONIA in the last 168 hours. CBC:  Recent Labs Lab 01/03/15 0031  01/04/15 0423 01/05/15 0948 01/06/15 0530 01/07/15 1152 01/08/15 0726  WBC 14.1*  < > 12.2* 18.6* 25.2* 16.9* 12.6*    NEUTROABS 9.9*  --   --   --   --   --   --   HGB 15.4*  < > 12.4 12.9 13.1 11.4* 11.7*  HCT 47.8*  < > 39.5 39.8 40.0 34.7* 36.1  MCV 82.6  < > 82.8 82.7 80.2 79.2 79.7  PLT 345  < > 262 231 290 253 226  < > = values in this interval not displayed. Cardiac Enzymes: No results for input(s): CKTOTAL, CKMB, CKMBINDEX, TROPONINI in the last 168 hours. BNP (last 3 results) No results for input(s): PROBNP in the last 8760 hours. CBG: No results for input(s): GLUCAP in the last 168 hours.  No results found for this or any previous visit (from the past 240 hour(s)).       Studies: Dg Abd Portable 1v  01/09/2015   CLINICAL DATA:  Abdominal pain.  EXAM: PORTABLE ABDOMEN - 1 VIEW  COMPARISON:  01/07/2015.  FINDINGS: Interim resolution of colonic distention. Stool is noted in the rectum. No free air noted. Linear collection of air in the pelvis is most likely within the rectum. Lumbar spine scoliosis degenerative change. Right hip replacement.  IMPRESSION: Interim resolution of colonic distention. Stool is noted in the rectum.   Electronically Signed   By: Marcello Moores  Register   On: 01/09/2015 07:36        Scheduled  Meds: . brimonidine  1 drop Both Eyes Daily  . diltiazem  1 application Topical BID  . gabapentin  600 mg Oral QHS  . hydrocortisone  25 mg Rectal BID  . ipratropium-albuterol  3 mL Inhalation BID  . levETIRAcetam  500 mg Intravenous Q12H  . levothyroxine  88 mcg Oral QAC breakfast  . losartan  25 mg Oral Daily  . mometasone-formoterol  2 puff Inhalation BID  . pantoprazole  40 mg Oral Daily  . polyethylene glycol  17 g Oral TID  . senna  2 tablet Oral Daily   Continuous Infusions: . dextrose 5 % 1,000 mL with potassium chloride 20 mEq infusion 50 mL/hr at 01/08/15 1319    Principal Problem:   BRBPR (bright red blood per rectum) Active Problems:   Alzheimer's disease   Long term current use of anticoagulant therapy   Breast cancer, left breast   Adult failure to  thrive   Coumadin toxicity   Dehydration with hypernatremia   Pressure ulcer    Time spent: 20 minutes   Megean Fabio, MD, FACP, FHM. [facility] Pager [phone]  If 7PM-7AM, please contact night-coverage [email or web address] Password [email or web address] 01/09/2015, 3:26 PM    LOS: 6 days

## 2015-01-09 NOTE — Progress Notes (Signed)
Utilization Review completed. Kahmari Koller RN BSN CM 

## 2015-01-10 LAB — PROTIME-INR
INR: 1.33 (ref 0.00–1.49)
PROTHROMBIN TIME: 16.6 s — AB (ref 11.6–15.2)

## 2015-01-10 NOTE — Progress Notes (Signed)
Daughter says that pt is going home today.  [facility] to continue to provide ongoing hospice care at home.  [place] [address], [place] Ext: [phone]

## 2015-01-10 NOTE — Discharge Summary (Addendum)
Physician Discharge Summary  Jennifer Brennan:[id number] DOB: 1923-07-16 DOA: 01/02/2015  PCP: Estill Dooms, MD  Admit date: 01/02/2015 Discharge date: 01/10/2015  Time spent: 35 minutes  Recommendations for Outpatient Follow-up:  1. Discharge home with home hospice 2. Patient has been taken off Coumadin.  Discharge Diagnoses:  Principal Problem:   BRBPR (bright red blood per rectum)   Active Problems:   Alzheimer's disease   Long term current use of anticoagulant therapy   Breast cancer, left breast   Adult failure to thrive   Coumadin toxicity   Dehydration with hypernatremia   Pressure ulcer   Discharge Condition: Guarded  Diet recommendation: Advance to full liquid, and then to regular as tolerated.  Filed Weights   01/03/15 0332  Weight: 63.6 kg (140 lb 3.4 oz)    History of present illness:   81 y.o. female currently on hospice for end stage dementia, HTN, hypothyroid, recurrent DVTs & A. fib on chronic Coumadin, chronic back pain on opioids, CVA presented to [facility] ED on 7/12 with rectal bleeding in the context of coagulopathy from Coumadin and fecal impaction. Coagulopathy was reversed with vitamin K. Anticoagulation indefinitely discontinued after discussing with family. Couple of enemas with good effect. Subsequent ileus - treated supportively. Bowel sounds improved, KUB-no further ileus. Resuming clear liquids 7/19 and advance as tolerated. As per discussion with patient's daughters at bedside, no aggressive workup requested.   Hospital Course:  Suspected acute lower GI bleed Differential in close hemorrhoidal be due to fecal impaction versus, diverticulosis, versus AVMs versus coagulopathy secondary to Coumadin - Coagulopathy reversed with vitamin K 10 mg and Coumadin discontinued -Hospitalist discussed extensively with patient's daughter at bedside and did not wish for any form of aggressive workup.  -Rectal bleeding resolved and hemoglobin remained  stable.  Colonic ileus - Abdominal distention, nonbloody small emesis 1 on 7/16. - Treated supportively with bowel rest, IV fluids and minimizing pain medications. - Appears to have resolved. Started on clears and tolerating well. Can advance diet as tolerated.  Coagulopathy secondary to Coumadin - Coumadin held and INR reversed with vitamin K - Discontinued anticoagulation indefinitely discussion with patient's daughters. H&H stable.  Dehydration with hypernatremia Resolved with IV hydration. DC IV fluids. Encourage oral intake.   Advanced dementia/failure to thrive - on home hospice. - Daughters agree that she will not be appropriate candidate for anticoagulation any longer due to significant GI bleed.    History of recurrent DVTs/A. Fib - Discussion regarding anticoagulation as above  Essential hypertension Blood pressure mildly elevated.. Continue losartan  Chronic pain Patient more sleepy than usual. Methadone was held during hospitalization. May resume upon discharge . Will recommend to switch it to when necessary.  Hypothyroid - Continue Synthroid  Fecal impaction/constipation -Improved with enemas given while in the hospital. Continue MiraLAX.   Seizure disorder - Continue Keppra and gabapentin   Hypokalemia - Replaced   Patient hemodynamically stable today. Overall long-term prognosis is guarded. Will recommend to resume hospice. Was seen by physical therapy this morning and provided some resources on mobility at home.   Code Status: DO NOT RESUSCITATE  Family Communication: Discussed with patient's daughter  at bedside Disposition Plan: DC home with hospice.   Consultants:  None  Procedures:  None  Antibiotics:  None Discharge Exam: Filed Vitals:   01/10/15 0513  BP: 157/74  Pulse: 67  Temp: 99.4 F (37.4 C)  Resp: 16    General: Elderly female lying in bed appears fatigued, communicating very slowly  HEENT: Pallor present, moist oral  mucosa, supple neck Chest: Clear to auscultation bilaterally, no added sounds CVS: Normal S1 and S2, no murmurs rub or gallop GI: Soft, nondistended, nontender, bowel sounds present Musculoskeletal : Warm, no edema, chronic skin changes in lower extremities CNS: Alert to self and place only.   Discharge Instructions    Current Discharge Medication List    CONTINUE these medications which have NOT CHANGED   Details  acetaminophen (TYLENOL) 500 MG tablet Take 1,000 mg by mouth every 6 (six) hours as needed for mild pain, fever or headache. pain    brimonidine (ALPHAGAN) 0.15 % ophthalmic solution Place 1 drop into both eyes daily.     diltiazem 2 % GEL Apply 1 application topically 2 (two) times daily as needed (anal). Apply to anal fissures. Place a small amount just inside and outside of anus as needed   Associated Diagnoses: Chronic anticoagulation    feeding supplement, GLUCERNA SHAKE, (GLUCERNA SHAKE) LIQD Take 237 mLs by mouth 2 (two) times daily as needed (to administer medication and meal replacement).    Fluocinolone Acetonide 0.01 % OIL Use on scalp for psoriasis Qty: 20 mL, Refills: 5   Associated Diagnoses: Psoriasis of scalp    Fluticasone-Salmeterol (ADVAIR) 250-50 MCG/DOSE AEPB Inhale one puff every 12 hours to help breathing Qty: 60 each, Refills: 2   Associated Diagnoses: Bronchiolitis    haloperidol (HALDOL) 2 MG/ML solution Take 2-3 mg by mouth every 4 (four) hours as needed for agitation.     hydrocortisone (ANUSOL-HC) 25 MG suppository Place 1 suppository (25 mg total) rectally 2 (two) times daily. Qty: 12 suppository, Refills: 0    Ipratropium-Albuterol (COMBIVENT RESPIMAT) 20-100 MCG/ACT AERS respimat Inhale 2 puffs into the lungs every 6 (six) hours. Take 2 puffs 4 times daily to help with breathing as needed. Qty: 4 g, Refills: 11    levETIRAcetam (KEPPRA) 100 MG/ML solution Take 5 mLs (500 mg total) by mouth every 12 (twelve) hours. Qty: 473 mL,  Refills: 3    levothyroxine (SYNTHROID, LEVOTHROID) 88 MCG tablet One daily for thyroid supplement Qty: 90 tablet, Refills: 3   Associated Diagnoses: Unspecified hypothyroidism    losartan (COZAAR) 50 MG tablet Take 1 tablet daily by mouth for blood pressure. Qty: 90 tablet, Refills: 3    methadone (DOLOPHINE) 5 MG tablet Take one tablet by mouth three times daily for pain.HOSPICE PATIENT Qty: 90 tablet, Refills: 0   Associated Diagnoses: Sacroiliitis; Pain of right lower leg    omeprazole (PRILOSEC) 20 MG capsule Take 20 mg by mouth daily as needed (upset stomach). Take 1 tablet every other day to reduce stomach acids.    polyethylene glycol powder (GLYCOLAX) powder 17 gram in 4-8 oz fluid daily. Adjust as needed to achieve 1-2 soft stools daily. Qty: 255 g, Refills: 5   Associated Diagnoses: Unspecified constipation    PROAIR HFA 108 (90 BASE) MCG/ACT inhaler inhale 2 puffs by mouth every 6 hours if needed for wheezing Qty: 8.5 g, Refills: 2    triamcinolone cream (KENALOG) 0.1 % APPLY TO AFFECTED AREA TWICE A DAY TO RASH UNTIL IT IS RESOLVED Qty: 45 g, Refills: 5      STOP taking these medications     warfarin (COUMADIN) 2 MG tablet      warfarin (COUMADIN) 2.5 MG tablet      gabapentin (NEURONTIN) 300 MG capsule        Allergies  Allergen Reactions  . Bactrim [Sulfamethoxazole-Trimethoprim] Other (See Comments)  MD stopped due to kidney failure Also causes lips to swell  . Doxycycline Swelling    Causes lips to swell, wheezing  . Furosemide Other (See Comments)    MD stopped due to kidney failure  . Percocet [Oxycodone-Acetaminophen] Other (See Comments)    Causes SEIZURES  . Valsartan Other (See Comments)    MD stopped due to kidney failure  . Penicillins Hives  . Adhesive [Tape] Rash  . Morphine Sulfate Itching and Rash  . Sertraline Hcl Rash   Follow-up Information    Please follow up.   Why:  home hospice       The results of significant  diagnostics from this hospitalization (including imaging, microbiology, ancillary and laboratory) are listed below for reference.    Significant Diagnostic Studies: Dg Abd Acute W/chest  01/03/2015   CLINICAL DATA:  Bright red blood from rectum on our and a half ago. Constipation.  EXAM: DG ABDOMEN ACUTE W/ 1V CHEST  COMPARISON:  Chest 09/25/2013  FINDINGS: Shallow inspiration. Linear atelectasis in the right mid lung. Normal heart size and pulmonary vascularity. Mild central bronchiectasis with bronchial wall thickening suggesting chronic bronchitis. Calcification of the aorta.  Lumbar scoliosis convex towards the right. Large amount of stool in the rectum. Scattered gas is stool throughout the remainder the colon. No small or large bowel distention. No free intra-abdominal air. No abnormal air-fluid levels. No radiopaque stones.  IMPRESSION: Shallow inspiration with atelectasis in the right mid lung. Chronic bronchitic changes in the lungs.  Nonobstructive bowel gas pattern. Large amount of stool seen in the rectum.   Electronically Signed   By: Lucienne Capers M.D.   On: 01/03/2015 00:54   Dg Abd Portable 1v  01/09/2015   CLINICAL DATA:  Abdominal pain.  EXAM: PORTABLE ABDOMEN - 1 VIEW  COMPARISON:  01/07/2015.  FINDINGS: Interim resolution of colonic distention. Stool is noted in the rectum. No free air noted. Linear collection of air in the pelvis is most likely within the rectum. Lumbar spine scoliosis degenerative change. Right hip replacement.  IMPRESSION: Interim resolution of colonic distention. Stool is noted in the rectum.   Electronically Signed   By: Marcello Moores  Register   On: 01/09/2015 07:36   Dg Abd Portable 1v  01/07/2015   CLINICAL DATA:  Ileus.  EXAM: PORTABLE ABDOMEN - 1 VIEW  COMPARISON:  01/06/2015  FINDINGS: There is persistent mild dilatation of bowel loops, primarily colonic. No obvious free intraperitoneal air on this supine view.  IMPRESSION: Persistent mild large bowel dilatation.    Electronically Signed   By: Nolon Nations M.D.   On: 01/07/2015 08:46   Dg Abd Portable 1v  01/06/2015   CLINICAL DATA:  81 year old female with a history of fecal impaction  EXAM: PORTABLE ABDOMEN - 1 VIEW  COMPARISON:  Plain film 01/05/2015, abdomen 01/03/2015, prior CT 03/29/2013  FINDINGS: Compared to prior plain film there is increasing amount of colonic and small bowel dilation. Formed stool at the hepatic flexure. Less degree of formed stool in the rectal vault compared to the prior.  Mild dilation of central small bowel loops, increased from the prior.  No unexpected radiopaque foreign body.  Vascular calcifications.  Surgical changes of right total hip arthroplasty.  Re- demonstration of scoliotic curvature of the spine.  IMPRESSION: Compared to the prior plain film there is increasing degree of colonic gaseous dilation, though there appears to be decreased volume of formed stool in the rectal vault. Bowel gas pattern is nonspecific, though colonic ileus  is favored. Surveillance with plain films recommended of this abnormal bowel gas pattern, or, if there is concern for acute abdomen, CT would be useful for further evaluation.  Signed,  Dulcy Fanny. Earleen Newport, DO  [facility]  [facility]   Electronically Signed   By: Corrie Mckusick D.O.   On: 01/06/2015 08:33   Dg Abd Portable 1v  01/05/2015   CLINICAL DATA:  Fecal impaction.  EXAM: PORTABLE ABDOMEN - 1 VIEW  COMPARISON:  01/03/2015.  FINDINGS: Soft tissue structures are unremarkable. Large amount of stool is noted in the rectum consistent with fecal impaction. No bowel distention or free air. Severe thoracic spine scoliosis and degenerative change. Right hip replacement. Degenerative changes left hip. Left femoral head is slightly sclerotic. Avascular necrosis left femoral head cannot be excluded.  IMPRESSION: 1. Large amount of stool in the rectum consistent with fecal impaction. No bowel distention. 2.  Severe scoliosis and degenerative change lumbar spine. Right hip replacement. Degenerative changes left hip. Mild sclerosis of the left femoral head noted, although this may be degenerative, avascular necrosis of left femoral head cannot be excluded.   Electronically Signed   By: Marcello Moores  Register   On: 01/05/2015 07:18    Microbiology: No results found for this or any previous visit (from the past 240 hour(s)).   Labs: Basic Metabolic Panel:  Recent Labs Lab 01/04/15 0423 01/05/15 0948 01/07/15 1152 01/08/15 0726  NA 148* 145 138 137  K 3.2* 4.2 4.0 4.2  CL 114* 111 107 104  CO2 26 26 25 26   GLUCOSE 107* 84 140* 115*  BUN 18 14 14 8   CREATININE 0.97 1.08* 0.97 0.91  CALCIUM 8.3* 8.9 8.7* 8.9   Liver Function Tests: No results for input(s): AST, ALT, ALKPHOS, BILITOT, PROT, ALBUMIN in the last 168 hours. No results for input(s): LIPASE, AMYLASE in the last 168 hours. No results for input(s): AMMONIA in the last 168 hours. CBC:  Recent Labs Lab 01/04/15 0423 01/05/15 0948 01/06/15 0530 01/07/15 1152 01/08/15 0726  WBC 12.2* 18.6* 25.2* 16.9* 12.6*  HGB 12.4 12.9 13.1 11.4* 11.7*  HCT 39.5 39.8 40.0 34.7* 36.1  MCV 82.8 82.7 80.2 79.2 79.7  PLT 262 231 290 253 226   Cardiac Enzymes: No results for input(s): CKTOTAL, CKMB, CKMBINDEX, TROPONINI in the last 168 hours. BNP: BNP (last 3 results) No results for input(s): BNP in the last 8760 hours.  ProBNP (last 3 results) No results for input(s): PROBNP in the last 8760 hours.  CBG: No results for input(s): GLUCAP in the last 168 hours.     SignedLouellen Molder  [facility] 01/10/2015, 11:55 AM

## 2015-01-10 NOTE — Progress Notes (Signed)
Adonis Huguenin [facility] 5W-Room 13-[facility]-RN GIP Visit-Lisa Strandberg RN  This a related admission to her [facility] diagnosis of Alzheimer's Disease. She is a DNR.Patient is resting comfortably in her bed with her husband at her bedside. Patient denies any pain when asked. Daughter present and reports PT came in to show her how to do different exercises with her mother and will return with some handouts.  Patient has taken in 120 cc PO per chart review yesterday. Pt. To be discharged home this afternoon by PTAR.  [facility] will f/u in her home tomorrow.    Please call with any questions.  [facility] Liaison [phone]

## 2015-01-10 NOTE — Progress Notes (Signed)
SLP Cancellation Note  Patient Details Name: Jennifer Brennan MRN: [id number] DOB: 1923-10-28   Cancelled treatment:       Reason Eval/Treat Not Completed: Other (comment) (pt receiving breathing tx, daughter Blanch Media at bedside and reports pt just started diet, will reattempt later)   Luanna Salk, [facility] SLP [phone]

## 2015-01-10 NOTE — Clinical Social Work Note (Signed)
Per MD patient ready for DC home. RN and patient/family notified of DC home. Address confirmed by patient/family. DC packet on chart. Ambulance transport requested for 2:30PM. CSW signing off at this time.    Liz Beach MSW, [place], [place], [phone]

## 2015-01-10 NOTE — Progress Notes (Signed)
Nsg Discharge Note  Admit Date:  01/02/2015 Discharge date: 01/10/2015   Jennifer Brennan to be D/C'd Home per MD order.  AVS completed.  Copy for chart, and copy for patient signed, and dated. Patient/caregiver able to verbalize understanding.  Discharge Medication:   Medication List    STOP taking these medications        warfarin 2 MG tablet  Commonly known as:  COUMADIN     warfarin 2.5 MG tablet  Commonly known as:  COUMADIN      TAKE these medications        acetaminophen 500 MG tablet  Commonly known as:  TYLENOL  Take 1,000 mg by mouth every 6 (six) hours as needed for mild pain, fever or headache. pain     brimonidine 0.15 % ophthalmic solution  Commonly known as:  ALPHAGAN  Place 1 drop into both eyes daily.     diltiazem 2 % Gel  Apply 1 application topically 2 (two) times daily as needed (anal). Apply to anal fissures. Place a small amount just inside and outside of anus as needed     feeding supplement (GLUCERNA SHAKE) Liqd  Take 237 mLs by mouth 2 (two) times daily as needed (to administer medication and meal replacement).     Fluocinolone Acetonide 0.01 % Oil  Use on scalp for psoriasis     Fluticasone-Salmeterol 250-50 MCG/DOSE Aepb  Commonly known as:  ADVAIR  Inhale one puff every 12 hours to help breathing     haloperidol 2 MG/ML solution  Commonly known as:  HALDOL  Take 2-3 mg by mouth every 4 (four) hours as needed for agitation.     hydrocortisone 25 MG suppository  Commonly known as:  ANUSOL-HC  Place 1 suppository (25 mg total) rectally 2 (two) times daily.     Ipratropium-Albuterol 20-100 MCG/ACT Aers respimat  Commonly known as:  COMBIVENT RESPIMAT  Inhale 2 puffs into the lungs every 6 (six) hours. Take 2 puffs 4 times daily to help with breathing as needed.     levETIRAcetam 100 MG/ML solution  Commonly known as:  KEPPRA  Take 5 mLs (500 mg total) by mouth every 12 (twelve) hours.     levothyroxine 88 MCG tablet  Commonly known  as:  SYNTHROID, LEVOTHROID  One daily for thyroid supplement     losartan 50 MG tablet  Commonly known as:  COZAAR  Take 1 tablet daily by mouth for blood pressure.     methadone 5 MG tablet  Commonly known as:  DOLOPHINE  Take one tablet by mouth three times daily for pain.HOSPICE PATIENT     omeprazole 20 MG capsule  Commonly known as:  PRILOSEC  Take 20 mg by mouth daily as needed (upset stomach). Take 1 tablet every other day to reduce stomach acids.     polyethylene glycol powder powder  Commonly known as:  GLYCOLAX  17 gram in 4-8 oz fluid daily. Adjust as needed to achieve 1-2 soft stools daily.     PROAIR HFA 108 (90 BASE) MCG/ACT inhaler  Generic drug:  albuterol  inhale 2 puffs by mouth every 6 hours if needed for wheezing     triamcinolone cream 0.1 %  Commonly known as:  KENALOG  APPLY TO AFFECTED AREA TWICE A DAY TO RASH UNTIL IT IS RESOLVED        Discharge Assessment: Filed Vitals:   01/10/15 1330  BP: 147/62  Pulse: 60  Temp: 98.4 F (36.9 C)  Resp:  20  Stage II sacral ulcers and BLE discoloration.  IV catheter discontinued intact. Site without signs and symptoms of complications - no redness or edema noted at insertion site, patient denies c/o pain - only slight tenderness at site.  Dressing with slight pressure applied.  D/c Instructions-Education: Discharge instructions given to patient/family with verbalized understanding. D/c education completed with patient/family including follow up instructions, medication list, d/c activities limitations if indicated, with other d/c instructions as indicated by MD - patient able to verbalize understanding, all questions fully answered. Patient transported by PTAR to home with hospice.   Marticia Reifschneider Margaretha Sheffield, RN 01/10/2015 2:24 PM

## 2015-01-10 NOTE — Evaluation (Signed)
Physical Therapy Evaluation Patient Details Name: Jennifer Brennan MRN: [id number] DOB: 1923-07-30 Today's Date: 01/10/2015   History of Present Illness  Jennifer Brennan is a 81 y.o. female currently on hospice for end stage dementia. Patient presented to ED with rectal bleeding per dtr, caregiver. Pt with decreasing PO intake at home, FTT  Clinical Impression  Patient evaluated by Physical Therapy with no further acute PT needs identified, as pt is to be discharged home today. All education has been completed and the patient has no further questions.   Blanch Media, Ms. Leath's daughter verbalized teh desire to be able to help her mom get OOB, and they have a lift; HHPT is worth considering for lift training, and family training for more mobility, and therex -- will defer to [place].  See below for any follow-up Physical Therapy or equipment needs. PT is signing off. Thank you for this referral.     Follow Up Recommendations Home health PT    Equipment Recommendations  None recommended by PT    Recommendations for Other Services       Precautions / Restrictions Precautions Precautions: Fall Restrictions Weight Bearing Restrictions: No      Mobility  Bed Mobility                  Transfers                    Ambulation/Gait                Stairs            Wheelchair Mobility    Modified Rankin (Stroke Patients Only)       Balance                                             Pertinent Vitals/Pain Pain Assessment: No/denies pain    Home Living Family/patient expects to be discharged to:: Private residence Living Arrangements: Children;Spouse/significant other Available Help at Discharge: Family;Available 24 hours/day Type of Home: House       Home Layout: One level;Able to live on main level with bedroom/bathroom Home Equipment: Gilford Rile - 2 wheels;Hospital bed [facility] lift)      Prior Function Level  of Independence: Needs assistance   Gait / Transfers Assistance Needed: has been bed bound for at least 2 months           Hand Dominance   Dominant Hand: Right    Extremity/Trunk Assessment   Upper Extremity Assessment: Generalized weakness (some shoulder stiffness)           Lower Extremity Assessment: Generalized weakness (Some R hip stiffness compared to left)         Communication   Communication: Other (comment) (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  Other Exercises Other Exercises: Ankle Dorsiflexion stretches; bil feet, 20 second hold, 3 reps  Other Exercises: Heel Slides PROM, alternating, X10 Other Exercises:  passive hip abduction, alternating, x10 Other Exercises: Gentle hip rocking in hooklying Other Exercises: bil shoulder flexion and abduction, passively      Assessment/Plan    PT Assessment All further PT needs can be met in the next venue of care  PT Diagnosis Acute pain;Generalized weakness   PT Problem List Decreased strength;Decreased range of motion;Decreased activity tolerance;Decreased balance;Decreased mobility;Decreased knowledge of use of DME;Decreased knowledge of precautions;Pain  PT Treatment Interventions     PT Goals (Current goals can be found in the Care Plan section) Acute Rehab PT Goals Patient Stated Goal: did not state; Daughter voiced the desire for her mother to walk again -- this doesn't seem realistic PT Goal Formulation: All assessment and education complete, DC therapy     Frequency     Barriers to discharge        Co-evaluation               End of Session   Activity Tolerance: Patient tolerated treatment well;No increased pain Patient left: in bed;with call bell/phone within reach;with family/visitor present Nurse Communication: Other (comment) (Worth considering HHPT)         Time: 9941-2904 PT Time Calculation (min) (ACUTE ONLY): 23 min   Charges:   PT Evaluation $Initial PT Evaluation Tier I: 1 Procedure PT Treatments $Therapeutic Exercise: 8-22 mins   PT G Codes:        Quin Hoop 01/10/2015, 2:16 PM  Roney Marion, [place] Pager [phone] Office [phone]

## 2015-01-11 ENCOUNTER — Telehealth: Payer: Self-pay

## 2015-01-11 MED ORDER — FENTANYL 12 MCG/HR TD PT72: 12.5000 ug | MEDICATED_PATCH | TRANSDERMAL | Status: DC

## 2015-01-11 NOTE — Telephone Encounter (Signed)
It is proper to stop the warfarin due to the rectal bleeding. I think it is OK to resume the fentanyl if she is in pain.

## 2015-01-11 NOTE — Telephone Encounter (Signed)
Hospice nurse Burnis Kingfisher) called and questioned if patient should stop methadone while using fentanyl patch. Please clarify  [place] call back number is [phone]

## 2015-01-11 NOTE — Telephone Encounter (Signed)
Spoke with Dorann Ou verbalized understanding of Dr.Green's recommendations. Rx for fentanyl patch was printed

## 2015-01-11 NOTE — Telephone Encounter (Signed)
1.) Patient was seen in the ER, patient was told to d/c coumadin. Patient's daughter Blanch Media would like to know if this is ok with you, please advise  2.) Blanch Media would like to know if her mother can go back on Fentanyl patch, please advise

## 2015-01-15 ENCOUNTER — Other Ambulatory Visit: Payer: Medicare Other

## 2015-01-15 ENCOUNTER — Telehealth: Payer: Self-pay

## 2015-01-15 ENCOUNTER — Ambulatory Visit: Payer: Medicare Other | Admitting: Nurse Practitioner

## 2015-01-15 ENCOUNTER — Telehealth: Payer: Self-pay | Admitting: *Deleted

## 2015-01-15 NOTE — Telephone Encounter (Signed)
Called pt's daughter, Blanch Media to inquire abt appt for today to see NP. Pt's daughter told me that her mother has been under [place] for quite some time. Daughter said she called a while back to let us know her mother would not be able to attend this appt. Daughter said her mother is comfortable as far as pain is concerned and she feels her mother is at peace. Message to be fwd to Gentry Fitz, NP.

## 2015-01-15 NOTE — Telephone Encounter (Signed)
Noted. At this point in this patient's declining life, I think it is okay to stop medicines unless there for comfort purposes.

## 2015-01-15 NOTE — Telephone Encounter (Signed)
Hospice nurse called to give Dr.Green a status update. Patient is not taking any of her medications except fentanyl patch. Patient does not want to take by mouth medications. Patient tells her daughter and hospice nurse that they make her feel terrible. Patient reports feeling better since she stopped medications per hospice nurse Jennifer Brennan).   This is a FYI message, please advise if you have any instructions or recommendations.

## 2015-01-17 NOTE — Telephone Encounter (Signed)
Discussed with Hospice nurse.

## 2015-01-22 ENCOUNTER — Other Ambulatory Visit: Payer: Self-pay | Admitting: *Deleted

## 2015-01-22 MED ORDER — FENTANYL 12 MCG/HR TD PT72: 12.5000 ug | MEDICATED_PATCH | TRANSDERMAL | Status: DC

## 2015-01-22 NOTE — Telephone Encounter (Signed)
Patient daughter called and requested refill. Will pick up

## 2015-02-20 ENCOUNTER — Other Ambulatory Visit: Payer: Self-pay | Admitting: *Deleted

## 2015-02-20 MED ORDER — FENTANYL 12 MCG/HR TD PT72: 12.5000 ug | MEDICATED_PATCH | TRANSDERMAL | Status: DC

## 2015-02-20 NOTE — Telephone Encounter (Signed)
Patient daughter will pick up

## 2015-03-27 ENCOUNTER — Other Ambulatory Visit: Payer: Self-pay | Admitting: *Deleted

## 2015-03-27 MED ORDER — FENTANYL 12 MCG/HR TD PT72: 12.5000 ug | MEDICATED_PATCH | TRANSDERMAL | Status: DC

## 2015-03-27 NOTE — Telephone Encounter (Signed)
Hospice nurse called and requested and will pick up tomorrow.

## 2015-04-30 ENCOUNTER — Other Ambulatory Visit: Payer: Self-pay | Admitting: *Deleted

## 2015-04-30 MED ORDER — FENTANYL 12 MCG/HR TD PT72: 12.5000 ug | MEDICATED_PATCH | TRANSDERMAL | Status: DC

## 2015-04-30 NOTE — Telephone Encounter (Signed)
Hospice nurse requested and daughter will pick up Tried calling and Number busy times 2 (home and cell)

## 2015-05-29 ENCOUNTER — Other Ambulatory Visit: Payer: Self-pay | Admitting: *Deleted

## 2015-05-29 MED ORDER — FENTANYL 12 MCG/HR TD PT72: 12.5000 ug | MEDICATED_PATCH | TRANSDERMAL | Status: DC

## 2015-05-29 NOTE — Telephone Encounter (Signed)
Patty with Hospice called and stated that caregiver will pick up Rx.

## 2015-07-02 ENCOUNTER — Other Ambulatory Visit: Payer: Self-pay | Admitting: *Deleted

## 2015-07-02 MED ORDER — FENTANYL 12 MCG/HR TD PT72: 12.5000 ug | MEDICATED_PATCH | TRANSDERMAL | Status: DC

## 2015-07-02 NOTE — Telephone Encounter (Signed)
Hospice Nurse requested and faxed to pharmacy.

## 2015-07-13 ENCOUNTER — Telehealth: Payer: Self-pay | Admitting: *Deleted

## 2015-07-13 NOTE — Telephone Encounter (Signed)
Received form from [facility] wanting to know if patient was enrolled with Hospice Care and if Keppra was a Hospice Liability Drug. Dr. Nyoka Cowden and I was not sure how to respond to this. Called Hospice and spoke with Abigail Butts and she stated that this was not a hospice covered drug. Form faxed back to [facility] Fax [phone]

## 2015-07-19 ENCOUNTER — Other Ambulatory Visit: Payer: Self-pay | Admitting: Internal Medicine

## 2015-07-19 ENCOUNTER — Telehealth: Payer: Self-pay

## 2015-07-19 MED ORDER — NYSTATIN 100000 UNIT/GM EX CREA: TOPICAL_CREAM | CUTANEOUS | Status: AC

## 2015-07-19 NOTE — Telephone Encounter (Signed)
I sent Rx for nystatin cream ot the pharmacy [facility]).

## 2015-07-19 NOTE — Telephone Encounter (Signed)
Prescription for Nystatin called in.  Patient's nurse has been notified that prescription was called in.

## 2015-07-19 NOTE — Telephone Encounter (Signed)
The Hospice nurse, Jerilee Field, called to inquire about getting a prescription for Nystatin cream or powder called into the [place] on file for fungal discoloration in the groin area.   Please advise

## 2015-07-31 ENCOUNTER — Other Ambulatory Visit: Payer: Self-pay | Admitting: *Deleted

## 2015-07-31 MED ORDER — FENTANYL 12 MCG/HR TD PT72: 12.5000 ug | MEDICATED_PATCH | TRANSDERMAL | Status: DC

## 2015-07-31 NOTE — Telephone Encounter (Signed)
Jennifer Brennan with Hospice called and requested Rx to be faxed to pharmacy.

## 2015-09-03 ENCOUNTER — Other Ambulatory Visit: Payer: Self-pay | Admitting: *Deleted

## 2015-09-03 MED ORDER — FENTANYL 12 MCG/HR TD PT72: 12.5000 ug | MEDICATED_PATCH | TRANSDERMAL | Status: DC

## 2015-09-03 NOTE — Telephone Encounter (Signed)
Kristen with Hospice called and requested Rx for patient.

## 2015-10-03 ENCOUNTER — Other Ambulatory Visit: Payer: Self-pay | Admitting: *Deleted

## 2015-10-03 MED ORDER — FENTANYL 12 MCG/HR TD PT72: 12.5000 ug | MEDICATED_PATCH | TRANSDERMAL | Status: DC

## 2015-10-04 ENCOUNTER — Other Ambulatory Visit: Payer: Self-pay | Admitting: *Deleted

## 2015-10-04 MED ORDER — FENTANYL 12 MCG/HR TD PT72: 12.5000 ug | MEDICATED_PATCH | TRANSDERMAL | Status: DC

## 2015-10-04 NOTE — Telephone Encounter (Signed)
Needs another Rx faxed to [place] that states HOSPICE patient on the Rx. Fax to [phone]

## 2015-10-04 NOTE — Telephone Encounter (Signed)
Patient daughter called and requested a Rx to fax to Pharmacy. Patient is HOSPICE Patient. To fax to [facility]

## 2015-10-09 ENCOUNTER — Telehealth: Payer: Self-pay

## 2015-10-09 DIAGNOSIS — Z5189 Encounter for other specified aftercare: Secondary | ICD-10-CM

## 2015-10-09 NOTE — Telephone Encounter (Signed)
Called spoke with patients daughter she said yes she would be willing to have a nurse come out to the home and help with the care of the wound. Please Advise

## 2015-10-09 NOTE — Telephone Encounter (Signed)
Patients daughter called wanting help with her mother's wound on her bottom, she wants to know how to treat it. She say it has a hard center  She would like  to know if Dr. Nyoka Cowden makes house call,if so she would be willing to pay for a visit as she says her mother is bed bound.

## 2015-10-09 NOTE — Telephone Encounter (Signed)
Please place the order for the home care nurse to go out to the house and evaluate the wound

## 2015-10-09 NOTE — Telephone Encounter (Signed)
Perhaps we can send out a home care nurse. Will this be all right with the daughter?

## 2015-10-10 NOTE — Telephone Encounter (Signed)
Order placed

## 2015-10-11 ENCOUNTER — Other Ambulatory Visit: Payer: Self-pay | Admitting: *Deleted

## 2015-10-11 DIAGNOSIS — L409 Psoriasis, unspecified: Secondary | ICD-10-CM

## 2015-10-11 MED ORDER — FLUOCINOLONE ACETONIDE 0.01 % OT OIL: TOPICAL_OIL | OTIC | Status: AC

## 2015-11-01 ENCOUNTER — Other Ambulatory Visit: Payer: Self-pay | Admitting: *Deleted

## 2015-11-01 MED ORDER — FENTANYL 12 MCG/HR TD PT72: 12.5000 ug | MEDICATED_PATCH | TRANSDERMAL | Status: DC

## 2015-11-01 NOTE — Telephone Encounter (Signed)
Hospice nurse called and requested to be faxed to [facility]

## 2015-11-22 ENCOUNTER — Telehealth: Payer: Self-pay

## 2015-11-22 NOTE — Telephone Encounter (Signed)
Message left on triage voicemail, patient will be transferred to [facility] for Respite[facility] x 5 days.

## 2015-11-29 ENCOUNTER — Telehealth: Payer: Self-pay

## 2015-11-29 NOTE — Telephone Encounter (Signed)
Hospice called to give a FYI only about patients care giver removing patients Fentanyl patch and that she has had it off for a few days and is doing well with out it and if there are any changes they will let you know.

## 2015-11-29 NOTE — Telephone Encounter (Signed)
OK 

## 2015-12-07 ENCOUNTER — Other Ambulatory Visit: Payer: Self-pay | Admitting: *Deleted

## 2015-12-07 MED ORDER — FENTANYL 12 MCG/HR TD PT72: 12.5000 ug | MEDICATED_PATCH | TRANSDERMAL | Status: DC

## 2015-12-07 NOTE — Telephone Encounter (Signed)
[facility] called and requested refill to be faxed to pharmacy.

## 2015-12-10 ENCOUNTER — Other Ambulatory Visit: Payer: Self-pay | Admitting: *Deleted

## 2015-12-10 MED ORDER — FENTANYL 12 MCG/HR TD PT72: 12.5000 ug | MEDICATED_PATCH | TRANSDERMAL | Status: DC

## 2015-12-10 NOTE — Telephone Encounter (Signed)
[facility] called and stated that they do not have this Rx in stock and wants another Rx faxed to [address]. Faxed. Pharmacy stated caregiver is aware.

## 2016-01-21 ENCOUNTER — Other Ambulatory Visit: Payer: Self-pay | Admitting: *Deleted

## 2016-01-21 MED ORDER — FENTANYL 12 MCG/HR TD PT72: 12.5000 ug | MEDICATED_PATCH | TRANSDERMAL | 0 refills | Status: DC

## 2016-01-21 NOTE — Telephone Encounter (Signed)
Patient daughter, Blanch Media requested and faxed to pharmacy. HOSPICE patient.

## 2016-02-21 ENCOUNTER — Other Ambulatory Visit: Payer: Self-pay

## 2016-02-21 MED ORDER — FENTANYL 12 MCG/HR TD PT72: 12.5000 ug | MEDICATED_PATCH | TRANSDERMAL | 0 refills | Status: DC

## 2016-02-21 NOTE — Telephone Encounter (Signed)
RX faxed to [facility]

## 2016-03-25 ENCOUNTER — Other Ambulatory Visit: Payer: Self-pay | Admitting: *Deleted

## 2016-03-25 MED ORDER — FENTANYL 12 MCG/HR TD PT72: 12.5000 ug | MEDICATED_PATCH | TRANSDERMAL | 0 refills | Status: DC

## 2016-03-25 NOTE — Telephone Encounter (Signed)
Erin with Hospice requested and to be faxed to Pharmacy.

## 2016-03-27 ENCOUNTER — Telehealth: Payer: Self-pay

## 2016-03-27 MED ORDER — FENTANYL 25 MCG/HR TD PT72: 25.0000 ug | MEDICATED_PATCH | TRANSDERMAL | 0 refills | Status: AC

## 2016-03-27 NOTE — Telephone Encounter (Signed)
Message left on triage voicemail: Patient with increased pain in left hip. Patient's daughter is requesting increased dose of fentanyl patch.  Please advise

## 2016-03-27 NOTE — Telephone Encounter (Signed)
Increase Fentanyl to 25 mcg/ hour. Change patch q3d. Remove old patch. Dispense 10 patches.

## 2016-03-27 NOTE — Telephone Encounter (Signed)
RX printed, patient's daughter aware of medication change (increased dose)

## 2016-03-31 ENCOUNTER — Telehealth: Payer: Self-pay | Admitting: *Deleted

## 2016-03-31 NOTE — Telephone Encounter (Signed)
Noted  

## 2016-03-31 NOTE — Telephone Encounter (Signed)
Patient daughter, Blanch Media called and stated that patient is complaining of Back and hip pain when moved. No trauma. Daughter is wondering if patch should be increased. Daughter worries regarding her mothers pain. Please Advise.

## 2016-03-31 NOTE — Telephone Encounter (Signed)
Blanch Media stated that her mother is transitioning and is completely bed ridden. Stated that she is hardly eating and drinking. Doesn't think the xray is an option at this point.

## 2016-03-31 NOTE — Telephone Encounter (Signed)
I increased the fentanyl on 10/5/7. If daughter approves, we could schedule xray of the lumbar spine (5 views) and xray of the pelvis.

## 2016-04-23 DEATH — deceased
